# Patient Record
Sex: Female | Born: 1940
Health system: Southern US, Community
[De-identification: ages and names within clinical notes are randomized; demographics above are authoritative.]

## PROBLEM LIST (undated history)

## (undated) DIAGNOSIS — H3530 Unspecified macular degeneration: Secondary | ICD-10-CM

## (undated) DIAGNOSIS — M1990 Unspecified osteoarthritis, unspecified site: Secondary | ICD-10-CM

## (undated) DIAGNOSIS — I639 Cerebral infarction, unspecified: Secondary | ICD-10-CM

## (undated) DIAGNOSIS — R269 Unspecified abnormalities of gait and mobility: Secondary | ICD-10-CM

## (undated) DIAGNOSIS — G709 Myoneural disorder, unspecified: Secondary | ICD-10-CM

## (undated) DIAGNOSIS — G43909 Migraine, unspecified, not intractable, without status migrainosus: Secondary | ICD-10-CM

## (undated) DIAGNOSIS — D649 Anemia, unspecified: Secondary | ICD-10-CM

## (undated) DIAGNOSIS — M797 Fibromyalgia: Secondary | ICD-10-CM

## (undated) DIAGNOSIS — G43019 Migraine without aura, intractable, without status migrainosus: Secondary | ICD-10-CM

## (undated) DIAGNOSIS — K449 Diaphragmatic hernia without obstruction or gangrene: Secondary | ICD-10-CM

## (undated) DIAGNOSIS — M47812 Spondylosis without myelopathy or radiculopathy, cervical region: Secondary | ICD-10-CM

## (undated) DIAGNOSIS — J45909 Unspecified asthma, uncomplicated: Secondary | ICD-10-CM

## (undated) DIAGNOSIS — J189 Pneumonia, unspecified organism: Secondary | ICD-10-CM

## (undated) HISTORY — PX: DILATION AND CURETTAGE OF UTERUS: SHX78

## (undated) HISTORY — PX: HIATAL HERNIA REPAIR: SHX195

## (undated) HISTORY — DX: Spondylosis without myelopathy or radiculopathy, cervical region: M47.812

## (undated) HISTORY — DX: Migraine without aura, intractable, without status migrainosus: G43.019

## (undated) HISTORY — PX: ABDOMINAL HYSTERECTOMY: SHX81

## (undated) HISTORY — DX: Unspecified abnormalities of gait and mobility: R26.9

## (undated) HISTORY — PX: TONSILLECTOMY: SUR1361

---

## 1998-03-09 ENCOUNTER — Encounter: Payer: Self-pay | Admitting: Emergency Medicine

## 1998-03-09 ENCOUNTER — Emergency Department (HOSPITAL_COMMUNITY): Admission: EM | Admit: 1998-03-09 | Discharge: 1998-03-09 | Payer: Self-pay | Admitting: Emergency Medicine

## 1998-03-20 ENCOUNTER — Encounter: Payer: Self-pay | Admitting: Emergency Medicine

## 1998-03-20 ENCOUNTER — Emergency Department (HOSPITAL_COMMUNITY): Admission: EM | Admit: 1998-03-20 | Discharge: 1998-03-20 | Payer: Self-pay | Admitting: Emergency Medicine

## 1999-03-15 ENCOUNTER — Encounter: Payer: Self-pay | Admitting: Neurology

## 1999-03-15 ENCOUNTER — Encounter: Admission: RE | Admit: 1999-03-15 | Discharge: 1999-03-15 | Payer: Self-pay | Admitting: Neurology

## 2000-02-11 ENCOUNTER — Encounter: Admission: RE | Admit: 2000-02-11 | Discharge: 2000-03-19 | Payer: Self-pay | Admitting: Neurology

## 2006-09-09 ENCOUNTER — Encounter (INDEPENDENT_AMBULATORY_CARE_PROVIDER_SITE_OTHER): Payer: Self-pay | Admitting: Gastroenterology

## 2006-09-09 ENCOUNTER — Ambulatory Visit (HOSPITAL_COMMUNITY): Admission: RE | Admit: 2006-09-09 | Discharge: 2006-09-09 | Payer: Self-pay | Admitting: Gastroenterology

## 2007-09-09 ENCOUNTER — Encounter: Admission: RE | Admit: 2007-09-09 | Discharge: 2007-09-09 | Payer: Self-pay | Admitting: Internal Medicine

## 2007-10-09 ENCOUNTER — Encounter: Admission: RE | Admit: 2007-10-09 | Discharge: 2007-10-09 | Payer: Self-pay | Admitting: Orthopedic Surgery

## 2008-01-09 ENCOUNTER — Inpatient Hospital Stay (HOSPITAL_COMMUNITY): Admission: EM | Admit: 2008-01-09 | Discharge: 2008-01-13 | Payer: Self-pay | Admitting: Emergency Medicine

## 2008-01-11 ENCOUNTER — Ambulatory Visit: Payer: Self-pay | Admitting: Surgery

## 2008-01-11 ENCOUNTER — Encounter (INDEPENDENT_AMBULATORY_CARE_PROVIDER_SITE_OTHER): Payer: Self-pay | Admitting: *Deleted

## 2008-01-28 ENCOUNTER — Encounter: Admission: RE | Admit: 2008-01-28 | Discharge: 2008-01-28 | Payer: Self-pay | Admitting: Gastroenterology

## 2008-02-21 ENCOUNTER — Ambulatory Visit (HOSPITAL_COMMUNITY): Admission: RE | Admit: 2008-02-21 | Discharge: 2008-02-21 | Payer: Self-pay | Admitting: Gastroenterology

## 2008-04-21 ENCOUNTER — Encounter (INDEPENDENT_AMBULATORY_CARE_PROVIDER_SITE_OTHER): Payer: Self-pay | Admitting: General Surgery

## 2008-04-21 ENCOUNTER — Ambulatory Visit (HOSPITAL_COMMUNITY): Admission: RE | Admit: 2008-04-21 | Discharge: 2008-04-23 | Payer: Self-pay | Admitting: General Surgery

## 2010-06-18 ENCOUNTER — Other Ambulatory Visit: Payer: Self-pay | Admitting: Gastroenterology

## 2010-06-20 ENCOUNTER — Ambulatory Visit
Admission: RE | Admit: 2010-06-20 | Discharge: 2010-06-20 | Disposition: A | Discharge status: Home / Self Care | Payer: Medicare Other | Source: Ambulatory Visit | Attending: Gastroenterology | Admitting: Gastroenterology

## 2010-06-25 LAB — DIFFERENTIAL
Basophils Relative: 1 % (ref 0–1)
Lymphocytes Relative: 36 % (ref 12–46)
Lymphs Abs: 1.2 10*3/uL (ref 0.7–4.0)
Monocytes Relative: 10 % (ref 3–12)
Neutro Abs: 1.7 10*3/uL (ref 1.7–7.7)
Neutrophils Relative %: 49 % (ref 43–77)

## 2010-06-25 LAB — CBC
HCT: 40.9 % (ref 36.0–46.0)
Hemoglobin: 13.8 g/dL (ref 12.0–15.0)
MCHC: 33.8 g/dL (ref 30.0–36.0)
Platelets: 178 10*3/uL (ref 150–400)
RDW: 13.1 % (ref 11.5–15.5)

## 2010-06-25 LAB — TYPE AND SCREEN
ABO/RH(D): O NEG
Antibody Screen: NEGATIVE

## 2010-06-25 LAB — COMPREHENSIVE METABOLIC PANEL
BUN: 16 mg/dL (ref 6–23)
Calcium: 9.4 mg/dL (ref 8.4–10.5)
Creatinine, Ser: 0.89 mg/dL (ref 0.4–1.2)
Glucose, Bld: 117 mg/dL — ABNORMAL HIGH (ref 70–99)
Total Protein: 5.9 g/dL — ABNORMAL LOW (ref 6.0–8.3)

## 2010-06-25 LAB — ABO/RH: ABO/RH(D): O NEG

## 2010-06-25 LAB — PROTIME-INR: INR: 1 (ref 0.00–1.49)

## 2010-07-23 NOTE — Discharge Summary (Signed)
[name], [name]               ACCOUNT NO.:  [id number]   MEDICAL RECORD NO.:  [id number]          PATIENT TYPE:  INP   LOCATION:  3705                         FACILITY:  [facility]   PHYSICIAN:  Monte Fantasia, MD  DATE OF BIRTH:  24-Mar-1940   DATE OF ADMISSION:  01/09/2008  DATE OF DISCHARGE:  01/13/2008                               DISCHARGE SUMMARY   This 70 year old Caucasian lady patient was brought on January 09, 2008,  with the complaints of syncopal episode for which she was admitted in  the hospital.  The patient was on the telemetry monitoring.  She had a  CT of the head done on admission, which was negative.  EEG which was  done showed mild abnormal awake and sleep EEG with intermittent left  parietal slowing, and Neurology evaluation was done.  As per the  Neurology recommendation, no antiepileptic medications for now.  Diagnosis was syncopal seizures.  The patient also had a MRI and MRA of  the head.  The MRI of the head showed mild atrophy with advanced mal  disease, and MRA showed no vascular abnormality.  CT angio of the chest  was done due to high D-dimer studies, however, did not show any very  large central pulmonary embolism.  The patient had no telemetry events  and was fit to be discharged.   DISCHARGE DIAGNOSES:  1. Syncopal episode.  2. Syncopal seizures.  3. Fibromyalgia.  4. Degenerative joint disease.  5. Hiatal hernia.  6. Chronic back pain.  7. Migraine headaches.   PHYSICAL EXAMINATION:  VITAL SIGNS:  On discharge, temperature of 98.3,  heart rate of 75, respirations 18, blood pressure is 127/82, and 95%  saturations on room air.  HEENT:  Pupils equal and reacting to light.  No pallor.  NECK:  Supple.  No lymphadenopathy.  No carotid bruits.  RESPIRATORY:  Air entry is bilaterally equal.  No rales and no rhonchi.  CARDIOVASCULAR:  S1 and S2 normal.  No tachycardia, no gallops.  ABDOMEN:  Soft.  No organomegaly.  No masses.  There was no  distention  with no tenderness.  EXTREMITIES:  No edema feet.   LABORATORY DATA:  Labs done during the stay of the hospital.  MRA of the  head, no stenosis or vascular abnormality observed.  MRI of the head  mild-to-moderate atrophy with advanced small vessel disease that was  done on January 11, 2008.  CT angio done on January 10, 2008, showed no  large central pulmonary embolism, cardiomegaly, or moderate hiatal  hernia.  CT of the head done on January 09, 2008, showed no acute  abnormality.  CT cervical spine negative for fracture, cervical disk  degeneration, and spondylosis.  A 2-D echo done on January 11, 2008,  shows left ventricular ejection fraction of 55%-65%, no diagnostic left  ventricular regional wall abnormality; however, this possibility cannot  be completely excluded on the basis of the study and there was an  increased relative contribution of atrial contraction to the left  ventricular filling.   ASSESSMENT AND PLAN:  The patient will be discharged.  As  the patient is  on ciprofloxacin for urinary tract infection, we will continue for  another 5 days for same.  Urinalysis was positive on the day of  admission.   MEDICATIONS AT THE TIME OF DISCHARGE:  1. Aspirin 81 mg p.o. daily.  2. Ciprofloxacin 500 mg p.o. daily for 5 days.  3. Cymbalta 60 mg p.o. daily.  4. The patient has received influenza vaccine on December 2.  5. Protonix 40 mg p.o. q.12 h.  6. Senna 1 tablet p.o. nightly.   The patient has a primary care physician.  She is advised to follow up  with the primary care physician in 1 week.      Monte Fantasia, MD  Electronically Signed     MP/MEDQ  D:  01/13/2008  T:  01/13/2008  Job:  [id number]

## 2010-07-23 NOTE — Op Note (Signed)
NAMEMAN, BONNEAU               ACCOUNT NO.:  [id number]   MEDICAL RECORD NO.:  [id number]          PATIENT TYPE:  AMB   LOCATION:  ENDO                         FACILITY:  [facility]   PHYSICIAN:  Anselmo Rod, M.D.  DATE OF BIRTH:  08/03/1940   DATE OF PROCEDURE:  09/09/2006  DATE OF DISCHARGE:                               OPERATIVE REPORT   PROCEDURE PERFORMED:  Esophagogastroduodenoscopy with small bowel and  antral biopsies.   ENDOSCOPIST:  Anselmo Rod, M.D.   INSTRUMENT USED:  Pentax video panendoscope.   INDICATIONS FOR PROCEDURE:  A 70 year old white female with a history of  iron-deficiency anemia undergoing an EGD to rule out peptic ulcer  disease, esophagitis, gastritis, etc.   PREPROCEDURE PREPARATION:  Informed consent was procured.  The patient  was fasted for 8 hours prior to the procedure.  Risks and benefits of  the procedure were discussed with the patient in detail.   PREPROCEDURE PHYSICAL:  The patient had stable vital signs.  NECK:  Supple.  CHEST:  Clear to auscultation.  S1-S2 regular.  ABDOMEN:  Soft with normal bowel sounds.   DESCRIPTION OF PROCEDURE:  The patient was placed in the left lateral  decubitus position and sedated with 75 mcg of Fentanyl and 8 mg of  Versed given intravenously in slow incremental doses. Once the patient  was adequately sedate and maintained on low-flow oxygen and continuous  cardiac monitoring, the Pentax video panendoscope was advanced through  the mouthpiece, over the tongue, into the esophagus under direct vision.  The entire esophagus was widely patent with no evidence of ring,  stricture, mass, esophagitis or Barrett's mucosa. The scope was then  advanced into the stomach. A large hiatal hernia was noted on high  retroflexion with multiple erosions in it consistent with Sheria Lang  lesions, which I suspect are the source of the patient's iron deficiency  anemia. A small nodular lesion was biopsied from the antrum  (cold  biopsies x2). No ulcers, erosions, masses or polyps were seen in the mid  body and antrum. The proximal small bowel appeared normal. Small bowel  biopsies were done to rule out sprue. The patient tolerated the  procedure well without immediate complications.   IMPRESSION:  1. Normal-appearing esophagus.  2. Large hiatal hernia with Sheria Lang lesions.  3. Small nodule biopsied from the antrum (cold biopsies x2).  4. Normal proximal small bowel.  Small-bowel biopsies done to rule out      sprue.   RECOMMENDATIONS:  1. Await pathology results.  2. PPI of choice.  3. Avoid all nonsteroidals including aspirin for the next 4 weeks.  4. Proceed with a colonoscopy at this time.  Further recommendations      to be made in follow-up.      Anselmo Rod, M.D.  Electronically Signed     JNM/MEDQ  D:  09/09/2006  T:  09/10/2006  Job:  [id number]   cc:   Olene Craven, M.D.  Fax: [phone]

## 2010-07-23 NOTE — Procedures (Signed)
PAST MEDICAL HISTORY:  The patient is a 70 year old female who was  admitted for syncope, tachycardia back pain and headache.  The patient  had a syncopal episode, also seen having jerking motions and biting  tongue.  History of fibromyalgia and hiatal hernia.   CURRENT MEDICATIONS:  Lovenox, aspirin, Protonix, Cymbalta, Cipro,  Phenergan, Zofran, Robitussin, Tylenol, OxyContin, Dilaudid, Ventolin.   TECHNICAL:  An 18-channel EEG was performed based on standard  international 10-20 system.  Total recording time was 21.3 minutes.  The  seventeenth channel dedicated to EKG, which has demonstrated normal  sinus rhythm of 90 beats per minute.   Upon awakening, the posterior background activity was well developed; 11  Hz, symmetric, reactive to eye opening and closure.   During the wakefullness, there were 2 short runs of left parietal  slowing and with sharp transient, phase reversal at C4, most obvious at  page 18 and 24.  During drowsiness, there was also short runs of bio-  synchronized, frontal predominant rhythmic delta activity.   Photic stimulation with flash frequency 1-19 Hz, and hyperventilation  was performed.  There was occasionallyapperance of C4 sharp transient,  most obvious during post hyperventilation period of time.  There was no  epileptiform discharge recorded.   The patient was able to achieve stage II during sleeping, as evident by  vertex waves, sleep spindles, K complex.   In conclusion, this is mild abnormal awake and sleep EEG.  There were  short runs of intermittent left parietal slowing, mainly involving left  parietal region.  There was also evidence of C4 sharp transient.  But,  there was no evidence of typical epileptiform discharge.  The above  findings warrant the patient to have imaging study such as MRI of the  brain.      Levert Feinstein, MD  Electronically Signed     WJ:XBJY  D:  01/11/2008 18:19:22  T:  01/11/2008 23:09:48  Job #:  [id number]

## 2010-07-23 NOTE — Op Note (Signed)
[name], [name]               ACCOUNT NO.:  [id number]   MEDICAL RECORD NO.:  [id number]          PATIENT TYPE:  OIB   LOCATION:  0098                         FACILITY:  [facility]   PHYSICIAN:  Adolph Pollack, M.D.DATE OF BIRTH:  1940-08-26   DATE OF PROCEDURE:  04/21/2008  DATE OF DISCHARGE:                               OPERATIVE REPORT   PREOPERATIVE DIAGNOSES:  1. Large hiatal hernia.  2. Gastroesophageal reflux.   POSTOPERATIVE DIAGNOSES:  1. Large hiatal hernia.  2. Gastroesophageal reflux.   PROCEDURES:  1. Laparoscopic hiatal hernia repair with Surgisis mesh.  2. Laparoscopic Nissen fundoplication (over a size 56 dilator).   SURGEON:  Dr. Abbey Chatters.   ASSISTANT:  Luretha Murphy, MD   ANESTHESIA:  General.   INDICATIONS:  This is a 70 year old female who has had a known hiatal  hernia which has substantially enlarged over the past 2 years and  becoming increasing symptomatic with some intermittent dysphagia, likely  from compression of the hernia on the esophagus and reflux.  She now  presents for repair and fundoplication.  We have gone over the  procedure, risks, lifestyle modifications and aftercare preoperatively.   TECHNIQUE:  She was brought to the operating room, placed supine on the  operating table and a general anesthetic was administered.  A Foley  catheter was inserted into the bladder.  The abdominal wall was  sterilely prepped and draped.   In the left upper quadrant, a 1 cm incision was made inferior to the  costal margin.  Using a 10-mm OptiView trocar, I gained access into the  peritoneal cavity and a pneumoperitoneum was created.   The laparoscope was introduced through the 10 mm trocar and I inspected  the area directly underneath the trocar.  There was no bleeding or organ  injury.  I then placed a 10 mm trocar in a supraumbilical midline  position, two 10 mm trocars were placed in the right upper quadrant, a 5  mm trocar was then  placed in the left lateral left upper quadrant.  The  angled laparoscope was introduced through the epigastric trocar after  reversed Trendelenburg had been achieved.  I then made a 5 mm incision  and placed a self-retaining liver retractor and retracted the anterior  aspect of left lobe of the liver to expose a patulous hiatus.  I was  able to reduce the stomach and noted a hernia sac.  I began by dividing  the thin gastrohepatic ligament up to the right crus.  I then began  mobilizing the sac free from the mediastinum and its hiatal attachments  beginning at the level of the right crus, extending anteriorly and going  down toward the left crus.  This allowed me to reduce the stomach.   Following this, I then identified the left crus and began dissecting sac  free from it using the harmonic scalpel.  Once I had most of the sac  dissected from the left crus, I then identified the apex of the crura  posteriorly.  I created a partial window, but not complete.   I then identified  the fundus of the stomach and divided short gastric  vessels all the way up around the angle of His.  This then allowed me to  create a true retroesophageal window and a Penrose drain was placed  through this.  The gastroesophageal junction was then retracted  anteriorly.  I then dissected the sac free from all its crura and  diaphragmatic attachments.  I then resected a fair amount of the sac and  sent it to pathology.  At this point in time, there no further  attachments between the mediastinum of the stomach and stomach stayed  easily in the abdominal cavity, and I had good esophageal length.  There  is no evidence of gastric, splenic or esophageal injury.   I then repaired the hiatal hernia defect with interrupted 0  nonabsorbable pledgeted sutures, placing approximately four of these  posteriorly and one anteriorly.  There was slight angulation of the  esophagus.  After I had done this, I then brought a piece  of Surgisis  mesh into the field, (hiatal patch).  I hydrated it and then inserted  into the peritoneal cavity.  I then placed it to where the curve of the  U-shaped defect cut into it was posterior.  I then anchored the anterior  portions of the mesh to the anterior crura with 0 nonabsorbable suture.  The lateral and posterolateral portions were then anchored to the crura  with 0 nonabsorbable sutures.  This provided for good overlay and was  under no tension.   Following this, I then brought the fundus of the stomach to the  retroesophageal window.  A size 56 lighted bougie was then passed down  the esophagus and into the stomach.  A 360 degree fundoplication was  performed to a length of 2 to 2-1/2 cm.  The first two bites involved  the left leaf of the wrap esophagus and the right leaf of the wrap.  The  third most distal bite involved just the left and right leaves of the  wrap.  The bougie was removed this was noted to be a floppy Nissen under  no tension.   I then thoroughly inspected the area and noted no bleeding or organ  injury.  I then removed the self-retaining retractor.  The trocars were  removed and the pneumoperitoneum was released.   All skin incisions were closed with 4-0 Monocryl subcuticular stitches  followed by Steri-Strips and sterile dressings.  She tolerated the  procedure without apparent complications and was taken to the recovery  room in satisfactory condition.      Adolph Pollack, M.D.  Electronically Signed     TJR/MEDQ  D:  04/21/2008  T:  04/21/2008  Job:  [id number]   cc:   Anselmo Rod, M.D.  Fax: [phone]   Merlene Laughter. Renae Gloss, M.D.  Fax: [phone]   Deanna Artis. Sharene Skeans, M.D.  Fax: [phone]

## 2010-07-23 NOTE — Group Therapy Note (Signed)
[name], [name]               ACCOUNT NO.:  [id number]   MEDICAL RECORD NO.:  [id number]          PATIENT TYPE:  INP   LOCATION:  3705                         FACILITY:  [facility]   PHYSICIAN:  Monte Fantasia, MD  DATE OF BIRTH:  [birth year]                                 PROGRESS NOTE   A 70 year old Caucasian female patient was brought in on January 09, 2008, for admission for observation for syncope, possible seizure  episode, and patient was tachycardic on admission.  Patient had a CT  head which was negative.  Also, had EEG done which showed the mild  abnormal awake/asleep EEG.  There were short runs of intermittent left  parietal slowing involving C4 and S4 leads with evidence of C4 sharp  transient.  No evidence of epileptiform discharge and patient also had  an MRI and an MRA of the head.  The MRI of the head showed mild atrophy  with advanced small-vessel disease.  No acute intracranial findings.  MRA of the head showed no stenosis or vascular abnormality.  The CT  angio of the chest was also done in view of high D-dimer studies and a  syncopal episode which had no large central embolism, cardiomegaly, and  moderate hiatal hernia.  Patient had no telemetry events and was  evaluated by physical and occupational therapy.  Has had no complaints  over the last 24 hours.   VITAL SIGNS:  Temperature 97.8.  Heart rate of 79.  Respirations 20.  Blood pressure  is 121/79.  Oxygen saturation is 93%, room air.   EXAMINATION:  HEENT:  Neck is supple.  Pupils equal and reacting to light.  No pallor.  No lymphadenopathy.  No icterus.  No JVD.  RESPIRATORY EXAMINATION:  Air entry is bilaterally equal.  No rales.  No  rhonchi.  CARDIOVASCULAR EXAMINATION:  S1 and S2 normal.  No tachycardia.  No  gallops.  ABDOMEN:  Soft.  No organomegaly.  No masses felt.  No distention.  No  tenderness.  Bowel sounds are heard.  EXTREMITIES:  No edema.   LABS:  Total WBC of 4.7, H and H of  14.1/hematocrit 39.8, platelets is 222,  sodium is 141, potassium 3.9, chloride 105, bicarb 28, glucose 96, BUN  is 9, creatinine 0.7, bilirubin 0.5, alkaline phosphatase 74, AST is 10,  ALT 12, total protein 6.2, albumin is 3.4, calcium of 9.2.  Cardiac  enzymes, 3 sets, have been negative.  B12 is 407, folate is 8.7.  UA was  positive with leukocytes, moderate, few bacteria, WBCs 11 to 20, RPR is  negative.   ASSESSMENT AND PLAN:  1. Syncopal episode, possibly seizure disorder.  Seizure cannot be      ruled.  2. Tachycardia, resolved.  Pulmonary embolism ruled out.  3. Fibromyalgia.  4. Degenerative joint disease.  5. Hiatal hernia.  6. Chronic back pain.  7. Migraine headaches.   PLAN:  Patient had an EEG.  We will check with a prolactin level.  We will have  a neurology evaluation in view of the EEG report as dictated in the EEG  interpretation.  Ultram has been discontinued.   Patient is on ciprofloxacin for the urinary tract infection.  We will  continue the same for now.  No urine cultures sent.  Patient has no  white count and patient has been afebrile during this stay of the  hospital.   No telemetry events.  A 2D echo was done, that report is still pending.  We will follow up with the report as per the patient was told that the  echo was normal.  We will continue with the aspirin, Protonix, Cymbalta  as per the medication profile sheet.  DVT and GI prophylaxis given.      Monte Fantasia, MD  Electronically Signed     MP/MEDQ  D:  01/12/2008  T:  01/12/2008  Job:  [id number]

## 2010-07-23 NOTE — Op Note (Signed)
[name], [name]               ACCOUNT NO.:  [id number]   MEDICAL RECORD NO.:  [id number]          PATIENT TYPE:  AMB   LOCATION:  ENDO                         [facility]ACILITY:  [facility]   PHYSICIAN:  Anselmo Rod, M.D.  DATE OF BIRTH:  04-07-1940   DATE OF PROCEDURE:  09/09/2006  DATE OF DISCHARGE:                               OPERATIVE REPORT   PROCEDURE PERFORMED:  Screening colonoscopy.   ENDOSCOPIST:  Anselmo Rod, M.D.   INSTRUMENT USED:  Pentax video colonoscope.   INDICATIONS FOR PROCEDURE:  A 70 year old white female with a history of  iron-deficiency anemia, chronic constipation and left lower quadrant  pain undergoing colonoscopy to rule out colonic polyps, masses, etc.   PREPROCEDURE PREPARATION:  Informed consent was procured from the  patient. The patient was fasted for 8 hours prior to the procedure and  prepped with 2 Dulcolax pills, a bottle of magnesium citrate and a  gallon of NuLYTELY the night prior to the procedure.  The risks and  benefits of the procedure including a 10% miss rate of cancer and polyps  were discussed with the patient as well.   PREPROCEDURE PHYSICAL:  The patient had stable vital signs.  NECK:  Supple.  CHEST:  Clear to auscultation.  S1-S2 regular.  ABDOMEN:  Soft with normal bowel sounds.   DESCRIPTION OF PROCEDURE:  The patient was placed in the left lateral  decubitus position and sedated with an additional 50 mcg of Fentanyl and  3 mg of Versed given intravenously in slow incremental doses. Once the  patient was adequately sedate and maintained on low-flow oxygen and  continuous cardiac monitoring, the Pentax video colonoscope was advanced  from the rectum to the cecum with difficulty. The patient had  significant amount of residual stool in the colon. Multiple washes were  done.  There was evidence of diverticulosis throughout the colon with  more prominent changes in the left colon. No masses or polyps were seen.  Several stool  balls were seen. Some of the diverticula had inspissated  stool in them.The appendiceal orifice and the ileocecal valve were  visualized and photographed. The terminal ileum appeared healthy and  without lesions. Retroflexion in the rectum revealed no abnormalities.  The patient tolerated the procedure well without complications.   IMPRESSION:  1. Pandiverticulosis with more prominent changes in the left colon.  2. No masses or polyps seen.  3. Significant amount of residual stool in the colon.  4. Normal terminal ileum.  5. No source of anemia identified on the colonoscopy.   RECOMMENDATIONS:  1. A high-fiber diet has been discussed with the patient in detail.  2. Brochures on diverticulosis been given to the patient for      education.  3. Outpatient follow-up is advised to the next 2 weeks for further      recommendations.  4. Repeat colonoscopy has been advised in the next 10 years.  If the      patient has any abnormal symptoms prior to that, repeat colonoscopy      will be done at an earlier date.  Anselmo Rod, M.D.  Electronically Signed     JNM/MEDQ  D:  09/09/2006  T:  09/10/2006  Job:  [id number]   cc:   Olene Craven, M.D.  Fax: [phone]

## 2010-07-23 NOTE — Consult Note (Signed)
[name], [name]               ACCOUNT NO.:  [id number]   MEDICAL RECORD NO.:  [id number]          PATIENT TYPE:  INP   LOCATION:  3705                         FACILITY:  [facility]   PHYSICIAN:  Deanna Artis. Hickling, M.D.DATE OF BIRTH:  08-22-1940   DATE OF CONSULTATION:  01/12/2008  DATE OF DISCHARGE:                                 CONSULTATION   CHIEF COMPLAINT:  Syncope versus seizures.   HISTORY OF PRESENT CONDITION:  [name] is a 70 year old woman who  was at home, having breakfast with her mother.  She stood up, walked to  the left to get an apron.  She felt woozy as if she were outside her  body.  She lost consciousness and apparently fell to the floor striking  the back of her head.  She had a blank stare on her face.  She had some  shaking of her upper body and during the time she fell, but then this  stopped and she was limp and poorly responsive.   Currently, this lack of consciousness lasted until she arrived to the  emergency department at [facility].  She was admitted for  evaluation of her syncopal episode and to determine whether or not  seizures played a role in it.   The patient bit her tongue on the right side.  She did not have  incontinence.  She has not had any sleep disorder, problems with  headache, or of any sort.   PAST MEDICAL HISTORY:  Remarkable for possible TIA, for osteoarthritis,  for fibromyalgia, for hiatal hernia, for chronic back pain, and  migraine.  The patient was seen by my partner for back pain.  She says  that she had a similar syncopal episode fell out of a truck over 10  years ago.  She stopped working about that time.   CURRENT MEDICATIONS:  Cymbalta 60 mg daily, Imitrex for migraines.  She  had an epidural injection.   FAMILY HISTORY:  Remarkable for coronary artery disease in her mother.   SOCIAL HISTORY:  The patient does not use tobacco, alcohol, drugs.  She  had five children and is married.   REVIEW OF SYSTEMS:   Her 12-system review is negative except as noted  above in the history of present illness.   PHYSICAL EXAMINATION:  VITAL SIGNS:  Blood pressure 121/99, resting  pulse 79, respirations 20, temperature 97.8.  HEAD, EYES, EARS, NOSE AND THROAT:  No signs of infection.  NECK:  Supple neck, full range of motion.  No cranial or cervical  bruits.  LUNGS:  Clear to auscultation.  HEART:  No murmurs.  Pulses normal.  ABDOMEN:  Soft, nontender.  Bowel sounds normal.  EXTREMITIES:  Well-formed without edema, cyanosis, alterations in tone  or tight heel cords.  SKIN:  No lesions.  VASCULAR:  Tone was normal.  NEUROLOGIC:  Mental status:  The patient was awake, alert, attentive,  appropriate.  No dysphasia, dyspraxia, names objects, follows commands.  Conveys thoughts and feelings.  Cranial Nerves:  Round reactive pupils.  Normal fundi.  Full visual fields to double simultaneous stimuli.  The  patient has right eye amblyopia.  She has some problems with fully  moving the right eye conjugately with the left one.  She is able to  protrude tongue and elevate her uvula midline.  Air conduction greater  than bone conduction bilaterally.   Motor Examination:  The patient had normal strength in all four  extremities.  She showed no localized weakness.  She had good finger-to-  nose and rapid repetitive altering movements.  She had good finger  apposition.  Her gait was normal and tandem, the patient slightly moved  to the left side.  Romberg response is negative.  She could push up on  her toes and heels without difficulty.  Sensation was intact to cold,  vibration, stereognosis.   Cerebellar Examination:  Good finger-to-nose, heel-knee-shin.  Deep  tendon reflexes were normal proximally, diminished distally.  The  patient had bilateral flexor plantar responses.   I have reviewed her MRI scan of the brain and it is normal with mild  atrophy and with mild small vessel disease.  There is no evidence  of  acute infarction.  MRA showed widely patent intracranial vessels both in  the anterior and posterior circulation without significant stenosis.  CT  scan of the brain was normal.  Carotid Doppler showed no hemodynamically  significant stenosis.  A 2D echocardiogram showed normal ejection  fraction of 55-60%.   LABORATORY STUDIES:  Sodium 141, potassium 3.9, chloride 105, CO2 28,  BUN 9, creatinine 0.78, and platelet count 96,000.  White blood cell  count 4700, hemoglobin 14.1, hematocrit 39.8, platelet count 222,000.  Liver functions were normal.  Total cholesterol 155, HDL 48, LDL 87,  hemoglobin A1c 5.6.  B12 407, folic acid 8.7, triglycerides 100.   IMPRESSION:  1. Syncope 780.2.  2. Syncopal seizure 780.39.   DISCUSSION:  I am not certain at this time whether or not this patient  truly has epilepsy or just syncope.  She had a long period where she was  poorly responsive.  This would suggest that either she lost  consciousness when she fell or that she perhaps was in postictal state  from seizure.  There is a positive family history of seizure in her son.  The patient herself has never had a seizure.  It is important to note  that she bit her tongue without loss of continence.  I reviewed her EEG  and it is normal in the waking state, drowsiness in the natural sleep.   At this time, I am uncomfortable with the diagnosis of seizures.   PLAN:  The patient needs to refrain from driving for 6 months time.  If  she has further episodes, we will need to perform a sleep deprived EEG,  although saw a patient who is drowsy and in natural sleep with no  seizures.  One other option would be a prolonged ambulatory EEG to see  if we could find evidence of seizure activity.   Subsequent episode is more clear-cut than this, we might just place her  on antiepileptic medicine, but I am unwilling to do so because the  differential diagnosis is wide.  I appreciate the opportunity to   participate in her care.  If you have questions, I could be of  assistance, do not hesitate to contact me.  She is cleared for discharge  at this time.      Deanna Artis. Sharene Skeans, M.D.  Electronically Signed     [facility]/MED[facility]  D:  01/12/2008  T:  01/13/2008  Job:  [id number]   cc:   Monte Fantasia, MD

## 2010-07-23 NOTE — H&P (Signed)
[name], [name]               ACCOUNT NO.:  [id number]   MEDICAL RECORD NO.:  [id number]          PATIENT TYPE:  EMS   LOCATION:  MAJO                         FACILITY:  [facility]   PHYSICIAN:  Ladell Pier, M.D.   DATE OF BIRTH:  1940-03-17   DATE OF ADMISSION:  01/09/2008  DATE OF DISCHARGE:                              HISTORY & PHYSICAL   CHIEF COMPLAINT:  Syncopal episode.   HISTORY OF PRESENT ILLNESS:  The patient is a 70 year old white female  who stated that she went to church this morning, was doing fine except  for her back pain. Then she went over to her mother's house.  Both the  patient and her mother stated that because she had her church clothes  on, she told to go get an apron in the drawer so as not to mess up her  clothes.  The patient said she did know where the aprons were.  She went  to get it for her, and she was on her way to get the apron for her  daughter she noted her daughter was holding onto the drawer and then she  fell backward.  She said she fell backward on the floor and started  jerking all over.  She said her eyes were not rolled back, but kind of  going from side to side, and she was all stretched out with jerking  movements on the floor.  She stated that lasted for a while. She is not  sure how long it lasted.  The patient bit her tongue but no loss of  bladder or bowel function.  The patient called her neighbor and also  call 9-1-1.  Then after the episode she stated that the patient was just  lying, there not moving. When EMS came she was able to answer questions  but was very slow.  The patient is now back to baseline.  She could  vaguely remember feeling like she was going to pass out but does not  remember anything after that with the episode.   PAST MEDICAL HISTORY:  Significant for  1. Fibromyalgia.  2. DJD.  3. Hiatal hernia.  4. Chronic back pain for which she sees Dr. Alveda Reasons.  5. Migraine headaches.   FAMILY HISTORY:  Mother has  heart disease and open heart surgery.  Father died of pneumonia.  He also had a stroke.  He died at 69 years  old.   SOCIAL HISTORY:  She does not smoke or drink alcohol.  She is married.  She has five children.   MEDICATIONS:  She takes Cymbalta 60 mg daily, __________ 300 mg, she  recently started. She also gets epidural injections per Dr. Alveda Reasons for  her back pain. And she has Imitrex p.r.n. for migraine headaches.   ALLERGIES:  RELAFEN and causes hives, and ELAVIL causes change in mental  status.   REVIEW OF SYSTEMS:  Negative otherwise stated in HPI.   PHYSICAL EXAM:  VITAL SIGNS:  Temperature 98, blood pressure 126/92,  pulse of 118, respirations 18, pulse ox 98% on room air.  GENERAL: The patient lying  on the stretcher, does not seem to be in any  acute distress.  HEENT: Normocephalic, atraumatic.  Pupils reactive to light without  erythema.  She does have discoloration of the tongue so that she bit her  tongue.  CARDIOVASCULAR:  She is slightly tachycardiac.  LUNGS: Clear bilaterally.  ABDOMEN: Soft, nontender, nondistended.  Positive bowel sounds.  EXTREMITIES:  No edema.  NEUROLOGICAL:  Exam is nonfocal.   LABS:  PT 13.1, INR 1.  WBC 5.6, hemoglobin 15.4, MCV 91.8, platelet  180.  Chest x-ray: No acute disease.  C spine, cervical disk  degeneration and spondylosis. Head CT negative.   ASSESSMENT/PLAN:  1. Syncope, question of a seizure episode.  2. Tachycardia.  3. Fibromyalgia.   Will admit the patient to the hospital, question of the etiology of her  syncope. Sounds like she could have had a seizure. Sometimes Ultram can  lower your seizure threshold. Will discontinue the Ultram. Will get an  EEG. Will check a prolactin level. Will check D-dimer also because she  is tachycardiac, and tachycardia with hypertension can be caused by PE.  Will start on some IV fluids, cycle cardiac enzymes. Will check her TSH  with the tachycardia.      Ladell Pier, M.D.   Electronically Signed     [facility]/MED[facility]  D:  01/09/2008  T:  01/09/2008  Job:  [id number]   cc:   Merlene Laughter. Renae Gloss, M.D.

## 2010-12-10 LAB — CBC
HCT: 39.8
HCT: 46
MCV: 90
MCV: 90.1
MCV: 91.8
Platelets: 176
Platelets: 180
Platelets: 222
RBC: 4.42
RDW: 13.5
WBC: 5.2
WBC: 5.6

## 2010-12-10 LAB — APTT: aPTT: 26

## 2010-12-10 LAB — DIFFERENTIAL
Basophils Absolute: 0
Basophils Relative: 0
Eosinophils Absolute: 0.1
Eosinophils Relative: 2
Eosinophils Relative: 2
Lymphocytes Relative: 16
Lymphs Abs: 0.8
Monocytes Relative: 8
Neutrophils Relative %: 74

## 2010-12-10 LAB — TROPONIN I
Troponin I: 0.01
Troponin I: 0.01
Troponin I: <0.01

## 2010-12-10 LAB — COMPREHENSIVE METABOLIC PANEL
ALT: 11
AST: 18
AST: 19
Albumin: 3.4 — ABNORMAL LOW
Albumin: 3.7
Alkaline Phosphatase: 80
BUN: 11
BUN: 9
CO2: 27
CO2: 28
Calcium: 9
Calcium: 9.2
Chloride: 101
Chloride: 103
Creatinine, Ser: 0.78
Creatinine, Ser: 0.92
Creatinine, Ser: 0.93
Creatinine, Ser: 0.97
GFR calc Af Amer: >60
GFR calc Af Amer: >60
GFR calc non Af Amer: 57 — ABNORMAL LOW
GFR calc non Af Amer: >60
Glucose, Bld: 161 — ABNORMAL HIGH
Potassium: 5
Sodium: 135
Total Bilirubin: 0.5
Total Bilirubin: 0.6
Total Bilirubin: 0.7
Total Protein: 6
Total Protein: 6.1
Total Protein: 6.2

## 2010-12-10 LAB — CK TOTAL AND CKMB (NOT AT ARMC)
CK, MB: 2.4
CK, MB: 6.7 — ABNORMAL HIGH
Relative Index: 1.9
Relative Index: 4.4 — ABNORMAL HIGH
Relative Index: INVALID
Total CK: 91

## 2010-12-10 LAB — LIPID PANEL
HDL: 48
LDL Cholesterol: 87
Total CHOL/HDL Ratio: 3.2
Triglycerides: 100
VLDL: 20

## 2010-12-10 LAB — D-DIMER, QUANTITATIVE: D-Dimer, Quant: 1.17 — ABNORMAL HIGH

## 2010-12-10 LAB — URINE MICROSCOPIC-ADD ON

## 2010-12-10 LAB — URINALYSIS, ROUTINE W REFLEX MICROSCOPIC
Bilirubin Urine: NEGATIVE
Ketones, ur: NEGATIVE
Nitrite: NEGATIVE
Protein, ur: NEGATIVE
Urobilinogen, UA: 0.2

## 2010-12-10 LAB — PROTIME-INR
INR: 1
Prothrombin Time: 13.1
Prothrombin Time: 13.4

## 2010-12-10 LAB — FOLATE: Folate: 8.7

## 2010-12-10 LAB — TSH: TSH: 2.258

## 2010-12-10 LAB — HEMOGLOBIN A1C: Hgb A1c MFr Bld: 5.6

## 2010-12-10 LAB — VITAMIN B12: Vitamin B-12: 407 (ref 211–911)

## 2013-05-29 ENCOUNTER — Encounter (HOSPITAL_COMMUNITY): Payer: Self-pay | Admitting: Emergency Medicine

## 2013-05-29 ENCOUNTER — Emergency Department (HOSPITAL_COMMUNITY): Payer: Medicare Other

## 2013-05-29 ENCOUNTER — Observation Stay (HOSPITAL_COMMUNITY)
Admission: EM | Admit: 2013-05-29 | Discharge: 2013-05-31 | Disposition: A | Discharge status: Home / Self Care | Payer: Medicare Other | Attending: Internal Medicine | Admitting: Internal Medicine

## 2013-05-29 DIAGNOSIS — G459 Transient cerebral ischemic attack, unspecified: Secondary | ICD-10-CM

## 2013-05-29 DIAGNOSIS — R200 Anesthesia of skin: Secondary | ICD-10-CM

## 2013-05-29 DIAGNOSIS — G43909 Migraine, unspecified, not intractable, without status migrainosus: Secondary | ICD-10-CM

## 2013-05-29 DIAGNOSIS — R531 Weakness: Secondary | ICD-10-CM | POA: Diagnosis present

## 2013-05-29 DIAGNOSIS — R519 Headache, unspecified: Secondary | ICD-10-CM | POA: Diagnosis present

## 2013-05-29 DIAGNOSIS — R209 Unspecified disturbances of skin sensation: Secondary | ICD-10-CM | POA: Insufficient documentation

## 2013-05-29 DIAGNOSIS — M6281 Muscle weakness (generalized): Secondary | ICD-10-CM

## 2013-05-29 DIAGNOSIS — R51 Headache: Secondary | ICD-10-CM | POA: Insufficient documentation

## 2013-05-29 DIAGNOSIS — M542 Cervicalgia: Secondary | ICD-10-CM

## 2013-05-29 DIAGNOSIS — R29818 Other symptoms and signs involving the nervous system: Principal | ICD-10-CM | POA: Insufficient documentation

## 2013-05-29 DIAGNOSIS — Z886 Allergy status to analgesic agent status: Secondary | ICD-10-CM | POA: Insufficient documentation

## 2013-05-29 DIAGNOSIS — M797 Fibromyalgia: Secondary | ICD-10-CM | POA: Diagnosis present

## 2013-05-29 DIAGNOSIS — Z888 Allergy status to other drugs, medicaments and biological substances status: Secondary | ICD-10-CM | POA: Insufficient documentation

## 2013-05-29 DIAGNOSIS — Z87828 Personal history of other (healed) physical injury and trauma: Secondary | ICD-10-CM | POA: Insufficient documentation

## 2013-05-29 HISTORY — DX: Fibromyalgia: M79.7

## 2013-05-29 HISTORY — DX: Transient cerebral ischemic attack, unspecified: G45.9

## 2013-05-29 HISTORY — DX: Migraine, unspecified, not intractable, without status migrainosus: G43.909

## 2013-05-29 HISTORY — DX: Unspecified osteoarthritis, unspecified site: M19.90

## 2013-05-29 HISTORY — DX: Diaphragmatic hernia without obstruction or gangrene: K44.9

## 2013-05-29 HISTORY — DX: Anesthesia of skin: R20.0

## 2013-05-29 LAB — URINALYSIS, ROUTINE W REFLEX MICROSCOPIC
Bilirubin Urine: NEGATIVE
GLUCOSE, UA: NEGATIVE mg/dL
Hgb urine dipstick: NEGATIVE
Ketones, ur: NEGATIVE mg/dL
NITRITE: NEGATIVE
PH: 5.5 (ref 5.0–8.0)
Protein, ur: NEGATIVE mg/dL
SPECIFIC GRAVITY, URINE: 1.011 (ref 1.005–1.030)
Urobilinogen, UA: 0.2 mg/dL (ref 0.0–1.0)

## 2013-05-29 LAB — COMPREHENSIVE METABOLIC PANEL
ALBUMIN: 3.8 g/dL (ref 3.5–5.2)
ALT: 9 U/L (ref 0–35)
AST: 13 U/L (ref 0–37)
Alkaline Phosphatase: 72 U/L (ref 39–117)
BUN: 17 mg/dL (ref 6–23)
CALCIUM: 9.4 mg/dL (ref 8.4–10.5)
CHLORIDE: 100 meq/L (ref 96–112)
CO2: 28 meq/L (ref 19–32)
CREATININE: 0.83 mg/dL (ref 0.50–1.10)
GFR calc Af Amer: 80 mL/min — ABNORMAL LOW (ref >90)
GFR calc non Af Amer: 69 mL/min — ABNORMAL LOW (ref >90)
Glucose, Bld: 101 mg/dL — ABNORMAL HIGH (ref 70–99)
Potassium: 4 mEq/L (ref 3.7–5.3)
SODIUM: 140 meq/L (ref 137–147)
Total Bilirubin: 0.2 mg/dL — ABNORMAL LOW (ref 0.3–1.2)
Total Protein: 6.6 g/dL (ref 6.0–8.3)

## 2013-05-29 LAB — CBC WITH DIFFERENTIAL/PLATELET
BASOS ABS: 0 10*3/uL (ref 0.0–0.1)
BASOS PCT: 0 % (ref 0–1)
EOS PCT: 3 % (ref 0–5)
Eosinophils Absolute: 0.2 10*3/uL (ref 0.0–0.7)
HEMATOCRIT: 40.4 % (ref 36.0–46.0)
Hemoglobin: 14 g/dL (ref 12.0–15.0)
LYMPHS PCT: 16 % (ref 12–46)
Lymphs Abs: 0.9 10*3/uL (ref 0.7–4.0)
MCH: 31.7 pg (ref 26.0–34.0)
MCHC: 34.7 g/dL (ref 30.0–36.0)
MCV: 91.4 fL (ref 78.0–100.0)
MONO ABS: 0.5 10*3/uL (ref 0.1–1.0)
Monocytes Relative: 9 % (ref 3–12)
NEUTROS ABS: 4.2 10*3/uL (ref 1.7–7.7)
Neutrophils Relative %: 71 % (ref 43–77)
PLATELETS: 179 10*3/uL (ref 150–400)
RBC: 4.42 MIL/uL (ref 3.87–5.11)
RDW: 12.9 % (ref 11.5–15.5)
WBC: 5.9 10*3/uL (ref 4.0–10.5)

## 2013-05-29 LAB — URINE MICROSCOPIC-ADD ON

## 2013-05-29 LAB — PROTIME-INR
INR: 0.99 (ref 0.00–1.49)
Prothrombin Time: 12.9 seconds (ref 11.6–15.2)

## 2013-05-29 LAB — GLUCOSE, CAPILLARY: GLUCOSE-CAPILLARY: 86 mg/dL (ref 70–99)

## 2013-05-29 LAB — RAPID URINE DRUG SCREEN, HOSP PERFORMED
Amphetamines: NOT DETECTED
BARBITURATES: NOT DETECTED
BENZODIAZEPINES: NOT DETECTED
COCAINE: NOT DETECTED
Opiates: NOT DETECTED
TETRAHYDROCANNABINOL: NOT DETECTED

## 2013-05-29 LAB — ETHANOL

## 2013-05-29 LAB — TROPONIN I

## 2013-05-29 LAB — APTT: aPTT: 28 seconds (ref 24–37)

## 2013-05-29 MED ORDER — CLOPIDOGREL BISULFATE 75 MG PO TABS
75.0000 mg | ORAL_TABLET | Freq: Every day | ORAL | Status: DC
  Administered 2013-05-29 – 2013-05-31 (×2): 75 mg via ORAL
  Filled 2013-05-29 (×2): qty 1

## 2013-05-29 MED ORDER — SODIUM CHLORIDE 0.9 % IV SOLN
INTRAVENOUS | Status: AC
  Administered 2013-05-29: 23:00:00 via INTRAVENOUS

## 2013-05-29 MED ORDER — ACETAMINOPHEN 325 MG PO TABS
650.0000 mg | ORAL_TABLET | ORAL | Status: DC | PRN
  Administered 2013-05-31: 650 mg via ORAL
  Filled 2013-05-29: qty 2

## 2013-05-29 MED ORDER — ENOXAPARIN SODIUM 40 MG/0.4ML ~~LOC~~ SOLN
40.0000 mg | Freq: Every day | SUBCUTANEOUS | Status: DC
  Administered 2013-05-30: 40 mg via SUBCUTANEOUS
  Filled 2013-05-29 (×2): qty 0.4

## 2013-05-29 NOTE — ED Notes (Addendum)
Pt presents to department for evaluation of possible stroke. States she had numbness and weakness to L side of body yesterday. Friends at church this morning states her speech was slurred. Upon arrival strong equal bilateral grip strengths. Able to move all extremities. Pt is conscious alert and oriented x4. No facial droop noted. Denies pain. Pt also states she feels off balance today.

## 2013-05-29 NOTE — ED Notes (Signed)
Patient ambulated to restroom without difficulty with this RN.

## 2013-05-29 NOTE — ED Provider Notes (Signed)
CSN: [id number]     Arrival date & time 05/29/13  1658 History   First MD Initiated Contact with Patient 05/29/13 1714     Chief Complaint  Patient presents with  . Cerebrovascular Accident     (Consider location/radiation/quality/duration/timing/severity/associated sxs/prior Treatment) Patient is a 73 y.o. female presenting with neurologic complaint. The history is provided by the patient.  Neurologic Problem This is a new problem. The current episode started yesterday. The problem occurs constantly. The problem has been gradually improving. Associated symptoms include headaches. Pertinent negatives include no chest pain, no abdominal pain and no shortness of breath. Nothing aggravates the symptoms. Nothing relieves the symptoms. Treatments tried: excedrin. The treatment provided mild relief.    Past Medical History  Diagnosis Date  . Fibromyalgia    History reviewed. No pertinent past surgical history. No family history on file. History  Substance Use Topics  . Smoking status: Never Smoker   . Smokeless tobacco: Not on file  . Alcohol Use: No   OB History   Grav Para Term Preterm Abortions TAB SAB Ect Mult Living                 Review of Systems  Constitutional: Negative for fever and fatigue.  HENT: Negative for congestion and drooling.   Eyes: Negative for pain.  Respiratory: Negative for cough and shortness of breath.   Cardiovascular: Negative for chest pain.  Gastrointestinal: Negative for nausea, vomiting, abdominal pain and diarrhea.  Genitourinary: Negative for dysuria and hematuria.  Musculoskeletal: Negative for back pain, gait problem and neck pain.  Skin: Negative for color change.  Neurological: Positive for weakness and headaches. Negative for dizziness.       Altered sensation on the left side  Hematological: Negative for adenopathy.  Psychiatric/Behavioral: Negative for behavioral problems.  All other systems reviewed and are  negative.      Allergies  Aspirin and Relafen  Home Medications  No current outpatient prescriptions on file. BP 124/66  Pulse 68  Temp(Src) 97.8 F (36.6 C) (Oral)  Resp 20  SpO2 98% Physical Exam  Nursing note and vitals reviewed. Constitutional: She is oriented to person, place, and time. She appears well-developed and well-nourished.  HENT:  Head: Normocephalic.  Mouth/Throat: Oropharynx is clear and moist. No oropharyngeal exudate.  Eyes: Conjunctivae and EOM are normal. Pupils are equal, round, and reactive to light.  Neck: Normal range of motion. Neck supple.  Cardiovascular: Normal rate, regular rhythm, normal heart sounds and intact distal pulses.  Exam reveals no gallop and no friction rub.   No murmur heard. Pulmonary/Chest: Effort normal and breath sounds normal. No respiratory distress. She has no wheezes.  Abdominal: Soft. Bowel sounds are normal. There is no tenderness. There is no rebound and no guarding.  Musculoskeletal: Normal range of motion. She exhibits no edema and no tenderness.  Neurological: She is alert and oriented to person, place, and time.  alert, oriented x3 speech: normal in context and clarity memory: intact grossly cranial nerves II-XII: altered sensation to the left side of the face, no asymmetry noted, CN's otherwise intact motor strength: full proximally and distally no involuntary movements or tremors sensation: altered sensation to light touch in the left upper extremity and right lower extremity. cerebellar: finger-to-nose and heel-to-shin intact gait: normal forwards and backwards   Skin: Skin is warm and dry.  Psychiatric: She has a normal mood and affect. Her behavior is normal.    ED Course  Procedures (including critical care time) Labs  Review Labs Reviewed  COMPREHENSIVE METABOLIC PANEL - Abnormal; Notable for the following:    Glucose, Bld 101 (*)    Total Bilirubin 0.2 (*)    GFR calc non Af Amer 69 (*)    GFR calc  Af Amer 80 (*)    All other components within normal limits  HEMOGLOBIN A1C - Abnormal; Notable for the following:    Hemoglobin A1C 6.0 (*)    Mean Plasma Glucose 126 (*)    All other components within normal limits  URINALYSIS, ROUTINE W REFLEX MICROSCOPIC - Abnormal; Notable for the following:    Leukocytes, UA MODERATE (*)    All other components within normal limits  URINE MICROSCOPIC-ADD ON - Abnormal; Notable for the following:    Bacteria, UA FEW (*)    All other components within normal limits  BASIC METABOLIC PANEL - Abnormal; Notable for the following:    GFR calc non Af Amer 82 (*)    All other components within normal limits  GLUCOSE, CAPILLARY - Abnormal; Notable for the following:    Glucose-Capillary 166 (*)    All other components within normal limits  GLUCOSE, CAPILLARY - Abnormal; Notable for the following:    Glucose-Capillary 102 (*)    All other components within normal limits  GLUCOSE, CAPILLARY - Abnormal; Notable for the following:    Glucose-Capillary 106 (*)    All other components within normal limits  CBC WITH DIFFERENTIAL  URINE RAPID DRUG SCREEN (HOSP PERFORMED)  ETHANOL  PROTIME-INR  APTT  TROPONIN I  CBC  GLUCOSE, CAPILLARY  LIPID PANEL  GLUCOSE, CAPILLARY   Imaging Review Mri Brain Without Contrast  05/31/2013   CLINICAL DATA:  Left-sided headache and numbness, altered speech.  EXAM: MRI HEAD WITHOUT CONTRAST  MRA HEAD WITHOUT CONTRAST  TECHNIQUE: Multiplanar, multiecho pulse sequences of the brain and surrounding structures were obtained without intravenous contrast. Angiographic images of the head were obtained using MRA technique without contrast.  COMPARISON:  CT HEAD W/O CM dated 05/29/2013; MR MRA HEAD W/O CM dated 01/11/2008  FINDINGS: MRI HEAD FINDINGS  No reduced diffusion to suggest acute ischemia. No susceptibility artifact to suggest hemorrhage.  The ventricles and sulci are normal for patient's age. Scattered subcentimeter  supratentorial white matter T2 hyperintensities are seen, no midline shift or mass effect.  No abnormal extra-axial fluid collections. Visualized paranasal sinuses and mastoid air cells are well aerated. Severe right temporomandibular osteoarthrosis. Ocular globes and orbital contents are nonsuspicious though not tailored for evaluation. No abnormal sellar expansion. No cerebellar tonsillar ectopia. Somewhat bright T1 bone marrow signal may reflect osteopenia.  MRA HEAD FINDINGS  Anterior circulation: Normal flow related enhancement of the included cervical, petrous, cavernous and supra clinoid internal carotid arteries. Patent anterior communicating artery. Normal flow related enhancement of the anterior and middle cerebral arteries, including more distal segments.  Posterior circulation: Right vertebral artery is dominant. Basilar artery is patent, with normal flow related enhancement of the main branch vessels. Normal flow related enhancement of the posterior cerebral arteries. Robust right and smaller left patent posterior communicating artery's.  No large vessel occlusion, hemodynamically significant stenosis, abnormal luminal irregularity, aneurysm within the anterior nor posterior circulation.  IMPRESSION: MRI head: No acute intracranial process, specifically no evidence of acute ischemia.  Moderate white matter changes may reflect chronic small vessel ischemic disease, within normal range for patient's age though, advanced from prior examination.  MRA head:  Normal examination with complete circle of Willis.   Electronically Signed  By: Awilda Metroourtnay  Bloomer   On: 05/31/2013 04:49   Mr Cervical Spine Wo Contrast  05/31/2013   CLINICAL DATA:  Left-sided neck pain with numbness and tingling in hand.  EXAM: MRI CERVICAL SPINE WITHOUT CONTRAST  TECHNIQUE: Multiplanar, multisequence MR imaging was performed. No intravenous contrast was administered.  COMPARISON:  CT HEAD W/O CM dated 05/29/2013; CT C SPINE W/O CM  dated 01/09/2008  FINDINGS: Cervical vertebral bodies and posterior elements appear intact. Straightened cervical lordosis. Stable grade 1 C3-4 anterolisthesis on degenerative basis. Moderate to severe C4-5 through C6-7 T1 disc height loss, with decreased T2 signal within all cervical disc most consistent with moderate desiccation. Moderate to severe chronic discogenic endplate changes at C5-6 thru C6-7, moderate C4-5. No abnormal bone marrow signal to suggest acute osseous process though, the STIR sequences moderately motion degraded.  Cervical spinal cord appears normal in morphology and signal characteristics from the cervicomedullary junction to the level of T3-4, the most caudal well visualized level. Craniocervical junction appears intact. Included prevertebral and paraspinal soft tissues are nonsuspicious.  Level by level evaluation:  C2-3: No disc bulge. Moderate right, mild left facet arthropathy without canal stenosis or neural foraminal narrowing.  C3-4: Anterolisthesis. Uncovertebral hypertrophy, severe right and moderate left facet arthropathy without canal stenosis. Moderate right and mild-to-moderate left neural foraminal narrowing.  C4-5: Minimal annular bulging, uncovertebral hypertrophy and moderate to severe facet arthropathy. Mild canal stenosis. Severe bilateral neural foraminal narrowing.  C5-6: 3 mm broad-based disc bulge, asymmetric to left. Uncovertebral hypertrophy and mild facet arthropathy. Mild canal stenosis. Moderate to severe right, severe left neural foraminal narrowing.  C6-7: Annular bulging with uncovertebral hypertrophy. No canal stenosis. Moderate right and moderate to severe left neural foraminal narrowing.  C7-T1: 2 mm broad-based disc bulge asymmetric to left. Uncovertebral hypertrophy. No canal stenosis. Moderate left neural foraminal narrowing.  IMPRESSION: Straightened cervical lordosis with grade 1 C3-4 anterolisthesis on degenerative basis.  Mild canal stenosis C4-5 and  C5-6.  Neural foraminal narrowing C3-4 thru C7-T1: Severe bilaterally at C4-5 and severe on the left at C5-6.   Electronically Signed   By: Awilda Metroourtnay  Bloomer   On: 05/31/2013 06:15   Mr Maxine GlennMra Head/brain Wo Cm  05/31/2013   CLINICAL DATA:  Left-sided headache and numbness, altered speech.  EXAM: MRI HEAD WITHOUT CONTRAST  MRA HEAD WITHOUT CONTRAST  TECHNIQUE: Multiplanar, multiecho pulse sequences of the brain and surrounding structures were obtained without intravenous contrast. Angiographic images of the head were obtained using MRA technique without contrast.  COMPARISON:  CT HEAD W/O CM dated 05/29/2013; MR MRA HEAD W/O CM dated 01/11/2008  FINDINGS: MRI HEAD FINDINGS  No reduced diffusion to suggest acute ischemia. No susceptibility artifact to suggest hemorrhage.  The ventricles and sulci are normal for patient's age. Scattered subcentimeter supratentorial white matter T2 hyperintensities are seen, no midline shift or mass effect.  No abnormal extra-axial fluid collections. Visualized paranasal sinuses and mastoid air cells are well aerated. Severe right temporomandibular osteoarthrosis. Ocular globes and orbital contents are nonsuspicious though not tailored for evaluation. No abnormal sellar expansion. No cerebellar tonsillar ectopia. Somewhat bright T1 bone marrow signal may reflect osteopenia.  MRA HEAD FINDINGS  Anterior circulation: Normal flow related enhancement of the included cervical, petrous, cavernous and supra clinoid internal carotid arteries. Patent anterior communicating artery. Normal flow related enhancement of the anterior and middle cerebral arteries, including more distal segments.  Posterior circulation: Right vertebral artery is dominant. Basilar artery is patent, with normal flow related enhancement of the  main branch vessels. Normal flow related enhancement of the posterior cerebral arteries. Robust right and smaller left patent posterior communicating artery's.  No large vessel  occlusion, hemodynamically significant stenosis, abnormal luminal irregularity, aneurysm within the anterior nor posterior circulation.  IMPRESSION: MRI head: No acute intracranial process, specifically no evidence of acute ischemia.  Moderate white matter changes may reflect chronic small vessel ischemic disease, within normal range for patient's age though, advanced from prior examination.  MRA head:  Normal examination with complete circle of Willis.   Electronically Signed   By: Awilda Metro   On: 05/31/2013 04:49     EKG Interpretation   Date/Time:  Sunday May 29 2013 17:34:09 EDT Ventricular Rate:  60 PR Interval:  197 QRS Duration: 86 QT Interval:  381 QTC Calculation: 381 R Axis:   -20 Text Interpretation:  Sinus rhythm Left ventricular hypertrophy Anterior Q  waves, possibly due to LVH Baseline wander in lead(s) III aVF No  significant change since last tracing Confirmed by Antonio Woodhams  MD, Joesphine Schemm  ([id number]) on 05/29/2013 5:59:42 PM      MDM   Final diagnoses:  Left-sided weakness  Left sided numbness  Headache    5:50 PM 73 y.o. female with a history of a concussion several years ago, migraine, who presents with headache and left-sided weakness/numbness which began early yesterday afternoon. She notes that her headache continued today and she took 2 Excedrin with mild to moderate relief. She currently rates her headache as a 5/10. She is not on any medicine for her headache currently. At church this morning her friends noticed that she was having some mild slurring of speech. There is also some suspicion that she had some facial droop yesterday as well. She is afebrile and vital signs are unremarkable here. She notes a mildly worse instability when walking from baseline today.   Will admit for TIA workup.     Junius Argyle, MD 05/31/13 2013

## 2013-05-29 NOTE — H&P (Signed)
Patient's PCP: Alva GarnetSHELTON,KIMBERLY R., MD  Chief Complaint: Headache, slurred speech, and left-sided numbness  History of Present Illness: [name] is a 73 y.o. Caucasian female with history of fibromyalgia, migraines, hiatal hernia, and degenerative joint disease who presents with the above complaints.  Patient reports that her symptoms started yesterday afternoon, when she noted that she had a left-sided headache with left-sided numbness and perhaps left-sided weakness.  In the morning, she had difficulty eating her breakfast, she choked and vomited.  She was not sure if this was related to her prior to hernia, as she has frequent episodes of choking and vomiting.  She took Excedrin and Aleve yesterday to see if her symptoms would improve.  She slept yesterday afternoon, to see if she could sleep off her symptoms.  She woke up feeling better but with persistent symptoms.  She slept at 11 p.m. last night, she went to church.  Some of the members in her church noted that her speech was slurred.  As a result she was brought to the emergency department this evening for further evaluation.  She denies any recent fevers, chills, chest pain, shortness of breath, abdominal pain, diarrhea, or vision changes.  She does complain of having left neck/shoulder pain with some numbness and tingling to her left third, fourth, and fifth fingers which she has had since January of 2015, she has been evaluated by her primary care physician and was told that she may have a pinched nerve, she is uncertain if she has had any imaging studies to her neck.  Of note, patient's son notes that her speech is unchanged and she had some slurring at baseline.  Review of Systems: All systems reviewed with the patient and positive as per history of present illness, otherwise all other systems are negative.  Past Medical History  Diagnosis Date  . Fibromyalgia   . DJD (degenerative joint disease)   . Hiatal hernia   . Migraine  headache    History reviewed. No pertinent past surgical history. Family History  Problem Relation Age of Onset  . CVA Mother    History   Social History  . Marital Status: Married    Spouse Name: N/A    Number of Children: N/A  . Years of Education: N/A   Occupational History  . Not on file.   Social History Main Topics  . Smoking status: Never Smoker   . Smokeless tobacco: Not on file  . Alcohol Use: No  . Drug Use: No  . Sexual Activity: Not on file   Other Topics Concern  . Not on file   Social History Narrative  . No narrative on file   Allergies: Aspirin; Elavil; and Relafen  Home Meds: Prior to Admission medications   Medication Sig Start Date End Date Taking? Authorizing Provider  aspirin-acetaminophen-caffeine (EXCEDRIN MIGRAINE) 256 [id number] [id number] MG per tablet Take by mouth every 6 (six) hours as needed for headache.   Yes Historical Provider, MD  ibuprofen (ADVIL,MOTRIN) 200 MG tablet Take 200 mg by mouth every 6 (six) hours as needed for mild pain.    Yes Historical Provider, MD  naproxen sodium (ANAPROX) 220 MG tablet Take 440 mg by mouth 2 (two) times daily with a meal.    Yes Historical Provider, MD    Physical Exam: Blood pressure 121/62, pulse 60, temperature 98.1 F (36.7 C), temperature source Oral, resp. rate 18, SpO2 98.00%. General: Awake, Oriented x3, No acute distress. HEENT: EOMI, Moist mucous membranes, speech slightly slurred. Neck:  Supple CV: S1 and S2 Lungs: Clear to ascultation bilaterally Abdomen: Soft, Nontender, Nondistended, +bowel sounds. Ext: Good pulses. Trace edema. No clubbing or cyanosis noted. Neuro: Cranial Nerves II-XII grossly intact. Has 5/5 motor strength in upper and lower extremities.  Lab results:  Recent Labs  05/29/13 1729  NA 140  K 4.0  CL 100  CO2 28  GLUCOSE 101*  BUN 17  CREATININE 0.83  CALCIUM 9.4    Recent Labs  05/29/13 1729  AST 13  ALT 9  ALKPHOS 72  BILITOT 0.2*  PROT 6.6  ALBUMIN 3.8    No results found for this basename: LIPASE, AMYLASE,  in the last 72 hours  Recent Labs  05/29/13 1729  WBC 5.9  NEUTROABS 4.2  HGB 14.0  HCT 40.4  MCV 91.4  PLT 179    Recent Labs  05/29/13 1752  TROPONINI <0.30   No components found with this basename: POCBNP,  No results found for this basename: DDIMER,  in the last 72 hours No results found for this basename: HGBA1C,  in the last 72 hours No results found for this basename: CHOL, HDL, LDLCALC, TRIG, CHOLHDL, LDLDIRECT,  in the last 72 hours No results found for this basename: TSH, T4TOTAL, FREET3, T3FREE, THYROIDAB,  in the last 72 hours No results found for this basename: VITAMINB12, FOLATE, FERRITIN, TIBC, IRON, RETICCTPCT,  in the last 72 hours Imaging results:  Ct Head Wo Contrast  05/29/2013   CLINICAL DATA:  Headache, left-sided numbness and tingling which began yesterday  EXAM: CT HEAD WITHOUT CONTRAST  TECHNIQUE: Contiguous axial images were obtained from the base of the skull through the vertex without intravenous contrast.  COMPARISON:  01/09/2008  FINDINGS: Normal ventricular morphology.  Generalized atrophy.  No midline shift or mass effect.  Mild small vessel chronic ischemic changes of deep cerebral white matter.  No intracranial hemorrhage, mass lesion, or evidence acute infarction.  No extra-axial fluid collections.  Bones and sinuses unremarkable.  IMPRESSION: Atrophy with mild small vessel chronic ischemic changes of deep cerebral white matter.  No acute intracranial abnormalities.   Electronically Signed   By: Ulyses Southward M.D.   On: 05/29/2013 18:39   Other results: EKG: Sinus rhythm with heart rate of 60.  Assessment & Plan by Problem: Left-sided numbness/weakness Improved.  Unclear etiology.  Uncertain if this is related to her migraines.  Given patient's age, initiate TIA/CVA workup.  Will get an MRI/MRA of the brain/neck, carotid Dopplers, and 2-D echocardiogram.  Patient has an allergy to aspirin,  which will start the patient on Plavix for now.  Check hemoglobin A1c and lipid panel in the morning to risk stratify the patient.  Continue neuro checks.  Blood pressure stable.  Will gently hydrate the patient on normal saline.  Chronic left-sided neck pain Due to the above factors, will get an MRI of her C-spine for further evaluation.  Requests PT/OT evaluation.  Fibromyalgia Stable.  Per family, has been on Cymbalta in the past, which patient is currently not on this medication.  Migraine/headache Stable.  Continue to monitor.  Primary care physician to address this as outpatient.  Prophylaxis Lovenox.  CODE STATUS Full code.  Disposition Admit the patient to telemetry as observation.  Time spent on admission, talking to the patient, and coordinating care was: 50 mins.  Aryah Doering A, MD 05/29/2013, 8:35 PM

## 2013-05-30 ENCOUNTER — Observation Stay (HOSPITAL_COMMUNITY): Payer: Medicare Other

## 2013-05-30 DIAGNOSIS — R4789 Other speech disturbances: Secondary | ICD-10-CM

## 2013-05-30 DIAGNOSIS — G459 Transient cerebral ischemic attack, unspecified: Secondary | ICD-10-CM

## 2013-05-30 LAB — BASIC METABOLIC PANEL
BUN: 16 mg/dL (ref 6–23)
CHLORIDE: 107 meq/L (ref 96–112)
CO2: 28 meq/L (ref 19–32)
CREATININE: 0.79 mg/dL (ref 0.50–1.10)
Calcium: 9.1 mg/dL (ref 8.4–10.5)
GFR calc Af Amer: >90 mL/min (ref >90)
GFR calc non Af Amer: 82 mL/min — ABNORMAL LOW (ref >90)
Glucose, Bld: 95 mg/dL (ref 70–99)
Potassium: 4.4 mEq/L (ref 3.7–5.3)
Sodium: 144 mEq/L (ref 137–147)

## 2013-05-30 LAB — LIPID PANEL
Cholesterol: 138 mg/dL (ref 0–200)
HDL: 53 mg/dL (ref >39)
LDL Cholesterol: 68 mg/dL (ref 0–99)
TRIGLYCERIDES: 87 mg/dL (ref <150)
Total CHOL/HDL Ratio: 2.6 RATIO
VLDL: 17 mg/dL (ref 0–40)

## 2013-05-30 LAB — CBC
HEMATOCRIT: 40.6 % (ref 36.0–46.0)
Hemoglobin: 13.6 g/dL (ref 12.0–15.0)
MCH: 30.6 pg (ref 26.0–34.0)
MCHC: 33.5 g/dL (ref 30.0–36.0)
MCV: 91.2 fL (ref 78.0–100.0)
PLATELETS: 175 10*3/uL (ref 150–400)
RBC: 4.45 MIL/uL (ref 3.87–5.11)
RDW: 12.9 % (ref 11.5–15.5)
WBC: 4.9 10*3/uL (ref 4.0–10.5)

## 2013-05-30 LAB — GLUCOSE, CAPILLARY
Glucose-Capillary: 95 mg/dL (ref 70–99)
Glucose-Capillary: 166 mg/dL — ABNORMAL HIGH (ref 70–99)
Glucose-Capillary: 102 mg/dL — ABNORMAL HIGH (ref 70–99)

## 2013-05-30 LAB — HEMOGLOBIN A1C
Hgb A1c MFr Bld: 6 % — ABNORMAL HIGH (ref <5.7)
Mean Plasma Glucose: 126 mg/dL — ABNORMAL HIGH (ref <117)

## 2013-05-30 MED ORDER — DIPHENHYDRAMINE HCL 25 MG PO CAPS: 25.0000 mg | ORAL_CAPSULE | Freq: Every evening | ORAL | Status: DC | PRN

## 2013-05-30 MED ORDER — ZOLPIDEM TARTRATE 5 MG PO TABS: 5.0000 mg | ORAL_TABLET | Freq: Every evening | ORAL | Status: DC | PRN

## 2013-05-30 MED ORDER — STROKE: EARLY STAGES OF RECOVERY BOOK
Freq: Once | Status: AC
  Administered 2013-05-30: 08:00:00
  Filled 2013-05-30: qty 1

## 2013-05-30 NOTE — Evaluation (Signed)
Physical Therapy Evaluation Patient Details Name: [name] MRN: [id number] DOB: 23-Oct-1940 Today's Date: 05/30/2013 Time: [id number] PT Time Calculation (min): 20 min  PT Assessment / Plan / Recommendation History of Present Illness  73 y.o. female [facility]13 with left sided weakness and numbness as well as slurred speech.  She has significant PMHx of fibromyalgia, R wrist carpal tunnel, DJD, hiatial hernia, migraines, and chronic left sided neck pain.  CT negative for acute event.  MRI still pending.    Clinical Impression  Pt is independent with all mobility with no signs of imbalance with level surface gait.  Her speech still sounds a little slurred.  Functional strength seems good with no signs of asymmetry left vs right leg.  PT to sign off.      PT Assessment  Patent does not need any further PT services    Follow Up Recommendations  No PT follow up    Does the patient have the potential to tolerate intense rehabilitation     NA  Barriers to Discharge   None      Equipment Recommendations  None recommended by PT    Recommendations for Other Services   None  Frequency   NA- one time eval and d/c   Precautions / Restrictions   None  Pertinent Vitals/Pain See vitals flow sheet.       Mobility  Bed Mobility Overal bed mobility: Independent Transfers Overall transfer level: Independent Equipment used: None Ambulation/Gait Ambulation/Gait assistance: Independent Ambulation Distance (Feet): 250 Feet Assistive device: None Gait Pattern/deviations: WFL(Within Functional Limits) Stairs: Yes Stairs assistance: Modified independent (Device/Increase time) Stair Management: One rail Right;Alternating pattern;Step to pattern;Forwards (alt up, step to down) Number of Stairs: 3 (limited by IV line) Modified Rankin (Stroke Patients Only) Pre-Morbid Rankin Score: No symptoms Modified Rankin: No significant disability        PT Goals(Current goals  can be found in the care plan section) Acute Rehab PT Goals Patient Stated Goal: to go home and have a full recovery PT Goal Formulation: No goals set, d/c therapy  Visit Information  Last PT Received On: 05/30/13 Assistance Needed: +1 History of Present Illness: 73 y.o. [facility] with left sided weakness and numbness as well as slurred speech.  She has significant PMHx of fibromyalgia, R wrist carpal tunnel, DJD, hiatial hernia, migraines, and chronic left sided neck pain.  CT negative for acute event.  MRI still pending.         Prior Functioning  Home Living Family/patient expects to be discharged to:: Private residence Living Arrangements: Alone Available Help at Discharge: Family;Available PRN/intermittently Type of Home: House Home Access: Stairs to enter Enter[employer] CorporationEntrance Stairs-Number of Steps: 3 Entrance Stairs-Rails: None Home Layout: One level Home Equipment: Walker - 2 wheels;Cane - single point;Shower seat;Wheelchair - manual;Grab bars - tub/shower Additional Comments: wears glasses to read and to drive. Has a tub/shower combo.   Prior Function Level of Independence: Independent Comments: at times uses an assistive device if she feels like she needs it.   Communication Communication: No difficulties Dominant Hand: Right    Cognition  Cognition Arousal/Alertness: Awake/alert Behavior During Therapy: WFL for tasks assessed/performed Overall Cognitive Status: Within Functional Limits for tasks assessed    Extremity/Trunk Assessment Upper Extremity Assessment Upper Extremity Assessment: Defer to OT evaluation (pt reports carpal tunnel on the right side, L wrist swell) Lower Extremity Assessment Lower Extremity Assessment: Overall WFL for tasks assessed (WNL sensation and no obvious  strength deficits) Cervical / Trunk Assessment Cervical / Trunk Assessment: Other exceptions Cervical / Trunk Exceptions: left sided neck pain.     Balance  Balance Overall balance assessment: No apparent balance deficits (not formally assessed) General Comments General comments (skin integrity, edema, etc.): Stroke education completed inculding signs and symptoms of stroke and importance of acting fast (not sleeping it off like she did this time).  Pt reports that other staff have reviewed this with her and she verbailized understanding.   End of Session PT - End of Session Activity Tolerance: Patient tolerated treatment well Patient left: in bed;with call bell/phone within reach;with nursing/sitter in room (seated EOB with RN) Nurse Communication: Mobility status;Other (comment) (ok for pt to go back and forth to the bathroom by herself. )  GP Functional Assessment Tool Used: assist level Functional Limitation: Mobility: Walking and moving around Mobility: Walking and Moving Around Current Status (W0981): 0 percent impaired, limited or restricted Mobility: Walking and Moving Around Goal Status (X9147): 0 percent impaired, limited or restricted Mobility: Walking and Moving Around Discharge Status [phone]): 0 percent impaired, limited or restricted   Kewanda Poland B. Frank Novelo, PT, DPT [phone]   05/30/2013, 10:39 AM

## 2013-05-30 NOTE — Progress Notes (Signed)
VASCULAR LAB PRELIMINARY  PRELIMINARY  PRELIMINARY  PRELIMINARY  Carotid duplex  completed.    Preliminary report:  Bilateral:  1-39% ICA stenosis.  Vertebral artery flow is antegrade.      Warwick Nick, RVT 05/30/2013, 11:14 AM

## 2013-05-30 NOTE — Evaluation (Signed)
Occupational Therapy Evaluation Patient Details Name: Ofilia NeasLinda K Mccorkel MRN: [id number] DOB: [birth year] Today's Date: 05/30/2013 Time: [id number] OT Time Calculation (min): 38 min  OT Assessment / Plan / Recommendation History of present illness 73 y.o. female [facility] with left sided weakness and numbness as well as slurred speech.  She has significant PMHx of fibromyalgia, R wrist carpal tunnel, DJD, hiatial hernia, migraines, and chronic left sided neck pain.  CT negative for acute event.  MRI still pending.     Clinical Impression  Pt. Does not present with any OT needs. Pt. Does not have any L sided weakness or any coordination difficulties in L UE. Pt. Is able to perform ADLs and mobility at baseline.     OT Assessment  Patient does not need any further OT services    Follow Up Recommendations  No OT follow up    Barriers to Discharge      Equipment Recommendations  None recommended by OT    Recommendations for Other Services    Frequency       Precautions / Restrictions     Pertinent Vitals/Pain No c/o    ADL  Eating/Feeding: Simulated;Independent Where Assessed - Eating/Feeding: Edge of bed Grooming: Performed;Wash/dry hands;Wash/dry face;Modified independent Where Assessed - Grooming: Unsupported standing Upper Body Bathing: Simulated;Independent Where Assessed - Upper Body Bathing: Unsupported sitting Lower Body Bathing: Simulated;Modified independent Where Assessed - Lower Body Bathing: Unsupported sitting;Unsupported standing Upper Body Dressing: Simulated;Independent Where Assessed - Upper Body Dressing: Unsupported sitting Lower Body Dressing: Performed;Independent Where Assessed - Lower Body Dressing: Unsupported sitting;Unsupported standing Toilet Transfer: Performed;Independent Toilet Transfer Method: Surveyor, mineralstand pivot Toilet Transfer Equipment: Comfort height toilet Toileting - ArchitectClothing Manipulation and Hygiene: Performed;Independent Where  Assessed - Toileting Clothing Manipulation and Hygiene: Standing Tub/Shower Transfer: Simulated;Independent Tub/Shower Transfer Method: Stand pivot Transfers/Ambulation Related to ADLs: Pt. is AMB without AE and is doing well. ADL Comments: No deficits noted with ADLs    OT Diagnosis:    OT Problem List:   OT Treatment Interventions:     OT Goals(Current goals can be found in the care plan section) Acute Rehab OT Goals Patient Stated Goal: go home  Visit Information  Last OT Received On: 05/30/13 Assistance Needed: +1 History of Present Illness: 73 y.o. [facility] with left sided weakness and numbness as well as slurred speech.  She has significant PMHx of fibromyalgia, R wrist carpal tunnel, DJD, hiatial hernia, migraines, and chronic left sided neck pain.  CT negative for acute event.  MRI still pending.         Prior Functioning     Home Living Family/patient expects to be discharged to:: Private residence Living Arrangements: Alone Available Help at Discharge: Family;Available PRN/intermittently Type of Home: House Home Access: Stairs to enter [employer] CorporationEntrance Stairs-Number of Steps: 3 Entrance Stairs-Rails: None Home Layout: One level Home Equipment: Walker - 2 wheels;Cane - single point;Shower seat;Wheelchair - manual;Grab bars - tub/shower Additional Comments: wears glasses to read and to drive. Has a tub/shower combo.   Prior Function Level of Independence: Independent Comments: at times uses an assistive device if she feels like she needs it.   Communication Communication: No difficulties Dominant Hand: Right         Vision/Perception Vision - History Baseline Vision: No visual deficits Patient Visual Report: No change from baseline   Cognition  Cognition Arousal/Alertness: Awake/alert Behavior During Therapy: WFL for tasks assessed/performed Overall Cognitive Status: Within Functional Limits for tasks assessed  Extremity/Trunk  Assessment Upper Extremity Assessment Upper Extremity Assessment: Overall WFL for tasks assessed Lower Extremity Assessment Lower Extremity Assessment: Overall WFL for tasks assessed (WNL sensation and no obvious strength deficits) Cervical / Trunk Assessment Cervical / Trunk Assessment: Other exceptions Cervical / Trunk Exceptions: left sided neck pain.       Mobility Bed Mobility Overal bed mobility: Independent Transfers Overall transfer level: Independent Equipment used: None     Exercise     Balance Balance Overall balance assessment: No apparent balance deficits (not formally assessed) General Comments General comments (skin integrity, edema, etc.): Stroke education completed inculding signs and symptoms of stroke and importance of acting fast (not sleeping it off like she did this time).  Pt reports that other staff have reviewed this with her and she verbailized understanding.    End of Session OT - End of Session Activity Tolerance: Patient tolerated treatment well Patient left: in bed;with call bell/phone within reach Nurse Communication:  (nurse OK'ed working with pt.)  GO Functional Limitation: Self care Self Care Current Status [phone]): 0 percent impaired, limited or restricted Self Care Goal Status (U0454): 0 percent impaired, limited or restricted Self Care Discharge Status [phone]): 0 percent impaired, limited or restricted   Davi Rotan 05/30/2013, 11:38 AM

## 2013-05-30 NOTE — Progress Notes (Signed)
Echo Lab  2D Echocardiogram completed.  [name] Megann Easterwood, RDCS 05/30/2013 3:19 PM

## 2013-05-30 NOTE — Progress Notes (Signed)
UR completed 

## 2013-05-30 NOTE — Progress Notes (Signed)
PROGRESS NOTE  [name] ZOX:[id number] DOB: 21-Nov-1940 DOA: 05/29/2013 PCP: Alva Garnet., MD  Assessment/Plan: Left-sided numbness/weakness  Improved, TIA/CVA workup including MRI/MRA of the brain/neck, carotid Dopplers, and 2-D echocardiogram. Patient has an allergy to aspirin, continue Plavix for now.  - Lipid panel LDL 68, HDL 53 - A1C 6.0 Chronic left-sided neck pain  Due to the above factors, will get an MRI of her C-spine for further evaluation. Requests PT/OT evaluation.  Fibromyalgia  Stable. Per family, has been on Cymbalta in the past, which patient is currently not on this medication.  Migraine/headache  Stable. Continue to monitor. Primary care physician to address this as outpatient.  Prophylaxis  Lovenox.  Diet: heart healthy Fluids: NS DVT Prophylaxis: Lovenox  Code Status: Full Family Communication: son at the bedside  Disposition Plan: home when ready  Consultants:  none  Procedures:  none   Antibiotics - none  HPI/Subjective: - without complaints this morning, left sided numbness resolved.   Objective: Filed Vitals:   05/29/13 1954 05/29/13 2133 05/30/13 0000 05/30/13 0400  BP: 121/62 128/65 112/57 120/53  Pulse: 60 63 63 62  Temp: 98.1 F (36.7 C) 97.9 F (36.6 C) 98.2 F (36.8 C) 97.9 F (36.6 C)  TempSrc: Oral Oral Oral Oral  Resp: 18 16 16 16   Height:  5\' 4"  (1.626 m)    Weight:  81.1 kg (178 lb 12.7 oz)    SpO2: 98% 96% 97% 97%   No intake or output data in the 24 hours ending 05/30/13 0716 Filed Weights   05/29/13 2133  Weight: 81.1 kg (178 lb 12.7 oz)    Exam:   General:  NAD  Cardiovascular: regular rate and rhythm, without MRG  Respiratory: good air movement, clear to auscultation throughout, no wheezing, ronchi or rales  Abdomen: soft, not tender to palpation, positive bowel sounds  MSK: no peripheral edema  Neuro: CN 2-12 grossly intact, MS 5/5 in all 4, mild slurred speech (per son this might be  chronic)  Data Reviewed: Basic Metabolic Panel:  Recent Labs Lab 05/29/13 1729  NA 140  K 4.0  CL 100  CO2 28  GLUCOSE 101*  BUN 17  CREATININE 0.83  CALCIUM 9.4   Liver Function Tests:  Recent Labs Lab 05/29/13 1729  AST 13  ALT 9  ALKPHOS 72  BILITOT 0.2*  PROT 6.6  ALBUMIN 3.8   CBC:  Recent Labs Lab 05/29/13 1729  WBC 5.9  NEUTROABS 4.2  HGB 14.0  HCT 40.4  MCV 91.4  PLT 179   Cardiac Enzymes:  Recent Labs Lab 05/29/13 1752  TROPONINI <0.30   CBG:  Recent Labs Lab 05/29/13 2145  GLUCAP 86   Studies: Ct Head Wo Contrast  05/29/2013   CLINICAL DATA:  Headache, left-sided numbness and tingling which began yesterday  EXAM: CT HEAD WITHOUT CONTRAST  TECHNIQUE: Contiguous axial images were obtained from the base of the skull through the vertex without intravenous contrast.  COMPARISON:  01/09/2008  FINDINGS: Normal ventricular morphology.  Generalized atrophy.  No midline shift or mass effect.  Mild small vessel chronic ischemic changes of deep cerebral white matter.  No intracranial hemorrhage, mass lesion, or evidence acute infarction.  No extra-axial fluid collections.  Bones and sinuses unremarkable.  IMPRESSION: Atrophy with mild small vessel chronic ischemic changes of deep cerebral white matter.  No acute intracranial abnormalities.   Electronically Signed   By: Ulyses Southward M.D.   On: 05/29/2013 18:39   Scheduled Meds: .  stroke: mapping our early stages of recovery book   Does not apply Once  . clopidogrel  75 mg Oral Q breakfast  . enoxaparin (LOVENOX) injection  40 mg Subcutaneous Daily   Continuous Infusions: . sodium chloride 50 mL/hr at 05/29/13 2308   Principal Problem:   Headache Active Problems:   Left-sided weakness   Left sided numbness   Fibromyalgia   TIA (transient ischemic attack)   Neck pain on left side  Time spent: 35  This note has been created with Education officer, environmentalDragon speech recognition software and smart phrase technology. Any  transcriptional errors are unintentional.   Pamella Pertostin Zoua Caporaso, MD [facility] Pager [phone]. If 7 PM - 7 AM, please contact night-coverage at [email or web address], password [email or web address] 05/30/2013, 7:16 AM  LOS: 1 day

## 2013-05-31 DIAGNOSIS — IMO0001 Reserved for inherently not codable concepts without codable children: Secondary | ICD-10-CM

## 2013-05-31 DIAGNOSIS — G43909 Migraine, unspecified, not intractable, without status migrainosus: Secondary | ICD-10-CM

## 2013-05-31 LAB — GLUCOSE, CAPILLARY: GLUCOSE-CAPILLARY: 106 mg/dL — AB (ref 70–99)

## 2013-05-31 MED ORDER — ATORVASTATIN CALCIUM 20 MG PO TABS: 20.0000 mg | ORAL_TABLET | Freq: Every day | ORAL | Status: DC

## 2013-05-31 MED ORDER — CLOPIDOGREL BISULFATE 75 MG PO TABS: 75.0000 mg | ORAL_TABLET | Freq: Every day | ORAL | Status: DC

## 2013-05-31 NOTE — Discharge Instructions (Signed)
STROKE/TIA DISCHARGE INSTRUCTIONS SMOKING Cigarette smoking nearly doubles your risk of having a stroke & is the single most alterable risk factor  If you smoke or have smoked in the last 12 months, you are advised to quit smoking for your health.  Most of the excess cardiovascular risk related to smoking disappears within a year of stopping.  Ask you doctor about anti-smoking medications  [place] Quit Line: 1-800-QUIT NOW  Free Smoking Cessation Classes [phone]  CHOLESTEROL Know your levels; limit fat & cholesterol in your diet  Lipid Panel     Component Value Date/Time   CHOL 138 05/30/2013 0629   TRIG 87 05/30/2013 0629   HDL 53 05/30/2013 0629   CHOLHDL 2.6 05/30/2013 0629   VLDL 17 05/30/2013 0629   LDLCALC 68 05/30/2013 0629      Many patients benefit from treatment even if their cholesterol is at goal.  Goal: Total Cholesterol (CHOL) less than 160  Goal:  Triglycerides (TRIG) less than 150  Goal:  HDL greater than 40  Goal:  LDL (LDLCALC) less than 100   BLOOD PRESSURE American Stroke Association blood pressure target is less that 120/80 mm/Hg  Your discharge blood pressure is:  BP: 126/91 mmHg  Monitor your blood pressure  Limit your salt and alcohol intake  Many individuals will require more than one medication for high blood pressure  DIABETES (A1c is a blood sugar average for last 3 months) Goal HGBA1c is under 7% (HBGA1c is blood sugar average for last 3 months)  Diabetes: Diagnosis of diabetes:  Your A1c:6.0 %    Lab Results  Component Value Date   HGBA1C 6.0* 05/29/2013     Your HGBA1c can be lowered with medications, healthy diet, and exercise.  Check your blood sugar as directed by your physician  Call your physician if you experience unexplained or low blood sugars.  PHYSICAL ACTIVITY/REHABILITATION Goal is 30 minutes at least 4 days per week  Activity: Increase activity slowly, Therapies: Physical Therapy: No F/U Return to work: N/A  Activity  decreases your risk of heart attack and stroke and makes your heart stronger.  It helps control your weight and blood pressure; helps you relax and can improve your mood.  Participate in a regular exercise program.  Talk with your doctor about the best form of exercise for you (dancing, walking, swimming, cycling).  DIET/WEIGHT Goal is to maintain a healthy weight  Your discharge diet is: Cardiac thin liquids Your height is:  Height: 5\' 4"  (162.6 cm) Your current weight is: Weight: 81.1 kg (178 lb 12.7 oz) Your Body Mass Index (BMI) is:  BMI (Calculated): 30.8  Following the type of diet specifically designed for you will help prevent another stroke.  Your goal weight range is:  108-132  Your goal Body Mass Index (BMI) is 19-24.  Healthy food habits can help reduce 3 risk factors for stroke:  High cholesterol, hypertension, and excess weight.  RESOURCES Stroke/Support Group:  Call [phone]   STROKE EDUCATION PROVIDED/REVIEWED AND GIVEN TO PATIENT Stroke warning signs and symptoms How to activate emergency medical system (call 911). Medications prescribed at discharge. Need for follow-up after discharge. Personal risk factors for stroke. Pneumonia vaccine given: No Flu vaccine given: No My questions have been answered, the writing is legible, and I understand these instructions.  I will adhere to these goals & educational materials that have been provided to me after my discharge from the hospital.

## 2013-05-31 NOTE — Discharge Summary (Signed)
Physician Discharge Summary  [name] ZOX:[id number] DOB: 1940/05/27 DOA: 05/29/2013  PCP: Alva Garnet., MD  Admit date: 05/29/2013 Discharge date: 05/31/2013  Time spent: 35 minutes  Recommendations for Outpatient Follow-up:  1. Repeat CBC and BMp in 2 weeks  Discharge Diagnoses:  Principal Problem:   Headache Active Problems:   Left-sided weakness   Left sided numbness   Fibromyalgia   TIA (transient ischemic attack)   Neck pain on left side   Discharge Condition: Stable  Diet recommendation: Heart Healthy  Filed Weights   05/29/13 2133  Weight: 81.1 kg (178 lb 12.7 oz)    History of present illness:  [name] is a 73 y.o. Caucasian female with history of fibromyalgia, migraines, hiatal hernia, and degenerative joint disease who presents with the above complaints. Patient reports that her symptoms started yesterday afternoon, when she noted that she had a left-sided headache with left-sided numbness and perhaps left-sided weakness. In the morning, she had difficulty eating her breakfast, she choked and vomited. She was not sure if this was related to her prior to hernia, as she has frequent episodes of choking and vomiting. She took Excedrin and Aleve yesterday to see if her symptoms would improve. She slept yesterday afternoon, to see if she could sleep off her symptoms. She woke up feeling better but with persistent symptoms. She slept at 11 p.m. last night, she went to church. Some of the members in her church noted that her speech was slurred. As a result she was brought to the emergency department this evening for further evaluation. She denies any recent fevers, chills, chest pain, shortness of breath, abdominal pain, diarrhea, or vision changes. She does complain of having left neck/shoulder pain with some numbness and tingling to her left third, fourth, and fifth fingers which she has had since January of 2015, she has been evaluated by her primary care  physician and was told that she may have a pinched nerve, she is uncertain if she has had any imaging studies to her neck. Of note, patient's son notes that her speech is unchanged and she had some slurring at baseline.  Hospital Course:  Patient is a pleasant 73 year old female with a past medical history of fibromyalgia, migraine headaches, admitted to the medicine service on 05/27/2013 presenting with sudden onset slurred speech, left-sided numbness. She was admitted to telemetry where she underwent TIA/CVA workup. Patient's symptoms last less than 24 hours. Initial CT scan of brain without contrast showed no acute intracranial abnormalities. This was followed up with an MRI of brain which did not reveal an acute intracranial process, no evidence of acute ischemia. Transthoracic echocardiogram showed an ejection fraction of 55-60% without wall motion abnormalities. Given aspirin allergy she will start on Plavix any 5 mg by mouth daily. Fasting lipid panel showed an LDL of 68. I discussed case with neurology who did not recommend discharging patient on statin therapy. She was discharged in stable condition to her home on 05/31/2013. Suspect symptoms related to transient ischemic attack.  Procedures:  Transthoracic echocardiogram performed on 05/30/2013 showing ejection fraction of 55-60% without wall motion abnormalities  Consultations:  Telephone conversation made to neurology, Dr. Amada Jupiter  Discharge Exam: Filed Vitals:   05/31/13 0805  BP: 126/91  Pulse: 75  Temp: 97.8 F (36.6 C)  Resp: 16    General: Patient is in no acute distress, awake alert oriented Cardiovascular: Regular rate rhythm normal S1-S2 Respiratory: Clear to auscultation bilaterally Abdomen: Soft nontender nondistended Neurological: No focal  Discharge Instructions  Discharge Orders   Future Orders Complete By Expires   Call MD for:  difficulty breathing, headache or visual disturbances  As directed    Call  MD for:  extreme fatigue  As directed    Call MD for:  hives  As directed    Call MD for:  persistant dizziness or light-headedness  As directed    Call MD for:  persistant nausea and vomiting  As directed    Call MD for:  redness, tenderness, or signs of infection (pain, swelling, redness, odor or green/yellow discharge around incision site)  As directed    Call MD for:  severe uncontrolled pain  As directed    Diet - low sodium heart healthy  As directed    Increase activity slowly  As directed        Medication List    STOP taking these medications       naproxen sodium 220 MG tablet  Commonly known as:  ANAPROX      TAKE these medications       aspirin-acetaminophen-caffeine 250-250-65 MG per tablet  Commonly known as:  EXCEDRIN MIGRAINE  Take by mouth every 6 (six) hours as needed for headache.     clopidogrel 75 MG tablet  Commonly known as:  PLAVIX  Take 1 tablet (75 mg total) by mouth daily with breakfast.     ibuprofen 200 MG tablet  Commonly known as:  ADVIL,MOTRIN  Take 200 mg by mouth every 6 (six) hours as needed for mild pain.       Allergies  Allergen Reactions  . Aspirin Anaphylaxis and Hives  . Elavil [Amitriptyline] Anaphylaxis  . Relafen [Nabumetone] Hives       Follow-up Information   Follow up with Alva GarnetSHELTON,KIMBERLY R., MD In 1 week.   Specialty:  Internal Medicine   Contact information:   [address] [place] [address] [phone]        The results of significant diagnostics from this hospitalization (including imaging, microbiology, ancillary and laboratory) are listed below for reference.    Significant Diagnostic Studies: Ct Head Wo Contrast  05/29/2013   CLINICAL DATA:  Headache, left-sided numbness and tingling which began yesterday  EXAM: CT HEAD WITHOUT CONTRAST  TECHNIQUE: Contiguous axial images were obtained from the base of the skull through the vertex without intravenous contrast.  COMPARISON:  01/09/2008   FINDINGS: Normal ventricular morphology.  Generalized atrophy.  No midline shift or mass effect.  Mild small vessel chronic ischemic changes of deep cerebral white matter.  No intracranial hemorrhage, mass lesion, or evidence acute infarction.  No extra-axial fluid collections.  Bones and sinuses unremarkable.  IMPRESSION: Atrophy with mild small vessel chronic ischemic changes of deep cerebral white matter.  No acute intracranial abnormalities.   Electronically Signed   By: Ulyses SouthwardMark  Boles M.D.   On: 05/29/2013 18:39   Mri Brain Without Contrast  05/31/2013   CLINICAL DATA:  Left-sided headache and numbness, altered speech.  EXAM: MRI HEAD WITHOUT CONTRAST  MRA HEAD WITHOUT CONTRAST  TECHNIQUE: Multiplanar, multiecho pulse sequences of the brain and surrounding structures were obtained without intravenous contrast. Angiographic images of the head were obtained using MRA technique without contrast.  COMPARISON:  CT HEAD W/O CM dated 05/29/2013; MR MRA HEAD W/O CM dated 01/11/2008  FINDINGS: MRI HEAD FINDINGS  No reduced diffusion to suggest acute ischemia. No susceptibility artifact to suggest hemorrhage.  The ventricles and sulci are normal for patient's age. Scattered  subcentimeter supratentorial white matter T2 hyperintensities are seen, no midline shift or mass effect.  No abnormal extra-axial fluid collections. Visualized paranasal sinuses and mastoid air cells are well aerated. Severe right temporomandibular osteoarthrosis. Ocular globes and orbital contents are nonsuspicious though not tailored for evaluation. No abnormal sellar expansion. No cerebellar tonsillar ectopia. Somewhat bright T1 bone marrow signal may reflect osteopenia.  MRA HEAD FINDINGS  Anterior circulation: Normal flow related enhancement of the included cervical, petrous, cavernous and supra clinoid internal carotid arteries. Patent anterior communicating artery. Normal flow related enhancement of the anterior and middle cerebral arteries,  including more distal segments.  Posterior circulation: Right vertebral artery is dominant. Basilar artery is patent, with normal flow related enhancement of the main branch vessels. Normal flow related enhancement of the posterior cerebral arteries. Robust right and smaller left patent posterior communicating artery's.  No large vessel occlusion, hemodynamically significant stenosis, abnormal luminal irregularity, aneurysm within the anterior nor posterior circulation.  IMPRESSION: MRI head: No acute intracranial process, specifically no evidence of acute ischemia.  Moderate white matter changes may reflect chronic small vessel ischemic disease, within normal range for patient's age though, advanced from prior examination.  MRA head:  Normal examination with complete circle of Willis.   Electronically Signed   By: Awilda Metro   On: 05/31/2013 04:49   Mr Cervical Spine Wo Contrast  05/31/2013   CLINICAL DATA:  Left-sided neck pain with numbness and tingling in hand.  EXAM: MRI CERVICAL SPINE WITHOUT CONTRAST  TECHNIQUE: Multiplanar, multisequence MR imaging was performed. No intravenous contrast was administered.  COMPARISON:  CT HEAD W/O CM dated 05/29/2013; CT C SPINE W/O CM dated 01/09/2008  FINDINGS: Cervical vertebral bodies and posterior elements appear intact. Straightened cervical lordosis. Stable grade 1 C3-4 anterolisthesis on degenerative basis. Moderate to severe C4-5 through C6-7 T1 disc height loss, with decreased T2 signal within all cervical disc most consistent with moderate desiccation. Moderate to severe chronic discogenic endplate changes at C5-6 thru C6-7, moderate C4-5. No abnormal bone marrow signal to suggest acute osseous process though, the STIR sequences moderately motion degraded.  Cervical spinal cord appears normal in morphology and signal characteristics from the cervicomedullary junction to the level of T3-4, the most caudal well visualized level. Craniocervical junction  appears intact. Included prevertebral and paraspinal soft tissues are nonsuspicious.  Level by level evaluation:  C2-3: No disc bulge. Moderate right, mild left facet arthropathy without canal stenosis or neural foraminal narrowing.  C3-4: Anterolisthesis. Uncovertebral hypertrophy, severe right and moderate left facet arthropathy without canal stenosis. Moderate right and mild-to-moderate left neural foraminal narrowing.  C4-5: Minimal annular bulging, uncovertebral hypertrophy and moderate to severe facet arthropathy. Mild canal stenosis. Severe bilateral neural foraminal narrowing.  C5-6: 3 mm broad-based disc bulge, asymmetric to left. Uncovertebral hypertrophy and mild facet arthropathy. Mild canal stenosis. Moderate to severe right, severe left neural foraminal narrowing.  C6-7: Annular bulging with uncovertebral hypertrophy. No canal stenosis. Moderate right and moderate to severe left neural foraminal narrowing.  C7-T1: 2 mm broad-based disc bulge asymmetric to left. Uncovertebral hypertrophy. No canal stenosis. Moderate left neural foraminal narrowing.  IMPRESSION: Straightened cervical lordosis with grade 1 C3-4 anterolisthesis on degenerative basis.  Mild canal stenosis C4-5 and C5-6.  Neural foraminal narrowing C3-4 thru C7-T1: Severe bilaterally at C4-5 and severe on the left at C5-6.   Electronically Signed   By: Awilda Metro   On: 05/31/2013 06:15   Mr Maxine Glenn Head/brain Wo Cm  05/31/2013   CLINICAL DATA:  Left-sided headache and numbness, altered speech.  EXAM: MRI HEAD WITHOUT CONTRAST  MRA HEAD WITHOUT CONTRAST  TECHNIQUE: Multiplanar, multiecho pulse sequences of the brain and surrounding structures were obtained without intravenous contrast. Angiographic images of the head were obtained using MRA technique without contrast.  COMPARISON:  CT HEAD W/O CM dated 05/29/2013; MR MRA HEAD W/O CM dated 01/11/2008  FINDINGS: MRI HEAD FINDINGS  No reduced diffusion to suggest acute ischemia. No  susceptibility artifact to suggest hemorrhage.  The ventricles and sulci are normal for patient's age. Scattered subcentimeter supratentorial white matter T2 hyperintensities are seen, no midline shift or mass effect.  No abnormal extra-axial fluid collections. Visualized paranasal sinuses and mastoid air cells are well aerated. Severe right temporomandibular osteoarthrosis. Ocular globes and orbital contents are nonsuspicious though not tailored for evaluation. No abnormal sellar expansion. No cerebellar tonsillar ectopia. Somewhat bright T1 bone marrow signal may reflect osteopenia.  MRA HEAD FINDINGS  Anterior circulation: Normal flow related enhancement of the included cervical, petrous, cavernous and supra clinoid internal carotid arteries. Patent anterior communicating artery. Normal flow related enhancement of the anterior and middle cerebral arteries, including more distal segments.  Posterior circulation: Right vertebral artery is dominant. Basilar artery is patent, with normal flow related enhancement of the main branch vessels. Normal flow related enhancement of the posterior cerebral arteries. Robust right and smaller left patent posterior communicating artery's.  No large vessel occlusion, hemodynamically significant stenosis, abnormal luminal irregularity, aneurysm within the anterior nor posterior circulation.  IMPRESSION: MRI head: No acute intracranial process, specifically no evidence of acute ischemia.  Moderate white matter changes may reflect chronic small vessel ischemic disease, within normal range for patient's age though, advanced from prior examination.  MRA head:  Normal examination with complete circle of Willis.   Electronically Signed   By: Awilda Metro   On: 05/31/2013 04:49    Microbiology: No results found for this or any previous visit (from the past 240 hour(s)).   Labs: Basic Metabolic Panel:  Recent Labs Lab 05/29/13 1729 05/30/13 0629  NA 140 144  K 4.0 4.4   CL 100 107  CO2 28 28  GLUCOSE 101* 95  BUN 17 16  CREATININE 0.83 0.79  CALCIUM 9.4 9.1   Liver Function Tests:  Recent Labs Lab 05/29/13 1729  AST 13  ALT 9  ALKPHOS 72  BILITOT 0.2*  PROT 6.6  ALBUMIN 3.8   No results found for this basename: LIPASE, AMYLASE,  in the last 168 hours No results found for this basename: AMMONIA,  in the last 168 hours CBC:  Recent Labs Lab 05/29/13 1729 05/30/13 0629  WBC 5.9 4.9  NEUTROABS 4.2  --   HGB 14.0 13.6  HCT 40.4 40.6  MCV 91.4 91.2  PLT 179 175   Cardiac Enzymes:  Recent Labs Lab 05/29/13 1752  TROPONINI <0.30   BNP: BNP (last 3 results) No results found for this basename: PROBNP,  in the last 8760 hours CBG:  Recent Labs Lab 05/29/13 2145 05/30/13 1133 05/30/13 1629 05/30/13 2209 05/31/13 0804  GLUCAP 86 95 166* 102* 106*       Signed:  Thersa Mohiuddin  [facility] 05/31/2013, 10:08 AM

## 2013-07-18 ENCOUNTER — Encounter (INDEPENDENT_AMBULATORY_CARE_PROVIDER_SITE_OTHER): Payer: Self-pay

## 2013-07-18 ENCOUNTER — Ambulatory Visit (INDEPENDENT_AMBULATORY_CARE_PROVIDER_SITE_OTHER): Payer: Medicare Other | Admitting: Neurology

## 2013-07-18 ENCOUNTER — Encounter: Payer: Self-pay | Admitting: Neurology

## 2013-07-18 VITALS — Ht 65.5 in | Wt 180.0 lb

## 2013-07-18 DIAGNOSIS — M797 Fibromyalgia: Secondary | ICD-10-CM

## 2013-07-18 DIAGNOSIS — R51 Headache: Secondary | ICD-10-CM

## 2013-07-18 DIAGNOSIS — R209 Unspecified disturbances of skin sensation: Secondary | ICD-10-CM

## 2013-07-18 DIAGNOSIS — M47812 Spondylosis without myelopathy or radiculopathy, cervical region: Secondary | ICD-10-CM | POA: Insufficient documentation

## 2013-07-18 DIAGNOSIS — R519 Headache, unspecified: Secondary | ICD-10-CM

## 2013-07-18 DIAGNOSIS — R200 Anesthesia of skin: Secondary | ICD-10-CM

## 2013-07-18 DIAGNOSIS — IMO0001 Reserved for inherently not codable concepts without codable children: Secondary | ICD-10-CM

## 2013-07-18 HISTORY — DX: Spondylosis without myelopathy or radiculopathy, cervical region: M47.812

## 2013-07-18 MED ORDER — PREGABALIN 25 MG PO CAPS: ORAL_CAPSULE | ORAL | Status: DC

## 2013-07-18 NOTE — Progress Notes (Signed)
Reason for visit: Possible TIA  EMILLIE CHASEN is a 73 y.o. female  History of present illness:  Ms. Schnepp is a 73 year old right-handed white female with a history of migraine headaches. In the past, she has had episodes of left sided headache, left facial discomfort and numbness and occasional episodes of left sided burning pain and numbness with the headache. Often times in the past, these events will culminate with a syncopal event. The last such event was in 2003. The patient went into the hospital around 05/29/2013 with onset of her left-sided headache, and left-sided symptoms of burning dysesthesias and numbness. A CT scan of the brain showed chronic small vessel changes, no acute changes were seen. MRI of the brain was done as part of a stroke workup. This showed no acute stroke, MRA of the head was unremarkable. Moderate small vessel ischemic changes were seen. The patient underwent a carotid Doppler study that was unremarkable, and a 2-D echocardiogram revealed an ejection fraction of 55-60%, otherwise unremarkable. She indicates that she has chronic problems dating back at least 15 years with neck discomfort mainly into the left side of the neck, left shoulder, and some pain down the left arm. MRI evaluation of the cervical spine was done with the recent hospitalization showing evidence of neuroforaminal stenosis bilaterally at the C4-5 level and to the left at the C5-6 level with a severe level of stenosis. The patient indicates that she will be weak on the left side with the headache events described above, also associated with blurring of vision and slurred speech. She indicates that the most recent episode lasted about 48 hours. She does report some low back pain. She has a history of fibromyalgia, and in the past she has been tried on gabapentin, and Cymbalta. She indicates that Cymbalta caused weight gain, and she is allergic to amitriptyline. She is sent to this office for further  evaluation.  Past Medical History  Diagnosis Date  . Fibromyalgia   . DJD (degenerative joint disease)   . Hiatal hernia   . Migraine headache   . Cervical spondylosis without myelopathy 07/18/2013    Past Surgical History  Procedure Laterality Date  . Tonsillectomy    . Hiatal hernia repair    . Abdominal hysterectomy    . Dilation and curettage of uterus      Family History  Problem Relation Age of Onset  . CVA Mother   . Stroke Father   . Emphysema Father     Social history:  reports that she has never smoked. She does not have any smokeless tobacco history on file. She reports that she drinks alcohol. She reports that she does not use illicit drugs.  Medications:  Current Outpatient Prescriptions on File Prior to Visit  Medication Sig Dispense Refill  . aspirin-acetaminophen-caffeine (EXCEDRIN MIGRAINE) 250-250-65 MG per tablet Take by mouth every 6 (six) hours as needed for headache.      . clopidogrel (PLAVIX) 75 MG tablet Take 1 tablet (75 mg total) by mouth daily with breakfast.  30 tablet  1  . ibuprofen (ADVIL,MOTRIN) 200 MG tablet Take 200 mg by mouth every 6 (six) hours as needed for mild pain.        No current facility-administered medications on file prior to visit.      Allergies  Allergen Reactions  . Aspirin Anaphylaxis and Hives  . Elavil [Amitriptyline] Anaphylaxis  . Relafen [Nabumetone] Hives    ROS:  Out of a complete 14  system review of symptoms, the patient complains only of the following symptoms, and all other reviewed systems are negative.  Fatigue Cough Easy bruising Joint pain, joint swelling, back pain Allergies, runny nose Not enough sleep, decreased energy, sleepiness Restless legs  Height 5' 5.5" (1.664 m), weight 180 lb (81.647 kg).  Physical Exam  General: The patient is alert and cooperative at the time of the examination.  Eyes: Pupils are equal, round, and reactive to light. Discs are flat bilaterally.  Neck: The  neck is supple, no carotid bruits are noted.  Respiratory: The respiratory examination is clear.  Cardiovascular: The cardiovascular examination reveals a regular rate and rhythm, no obvious murmurs or rubs are noted.  Neuromuscular: Range of movement of the cervical spine lacks only 10 of full lateral rotation bilaterally.   Skin: Extremities are without significant edema.  Neurologic Exam  Mental status: The patient is alert and oriented x 3 at the time of the examination. The patient has apparent normal recent and remote memory, with an apparently normal attention span and concentration ability.  Cranial nerves: Facial symmetry is present. There is good sensation of the face to pinprick and soft touch bilaterally. The strength of the facial muscles and the muscles to head turning and shoulder shrug are normal bilaterally. Speech is well enunciated, no aphasia or dysarthria is noted. Extraocular movements are full. Visual fields are full. The tongue is midline, and the patient has symmetric elevation of the soft palate. No obvious hearing deficits are noted.  Motor: The motor testing reveals 5 over 5 strength of all 4 extremities. Good symmetric motor tone is noted throughout.  Sensory: Sensory testing is intact to pinprick, soft touch, vibration sensation, and position sense on all 4 extremities. No evidence of extinction is noted.  Coordination: Cerebellar testing reveals good finger-nose-finger and heel-to-shin bilaterally.  Gait and station: Gait is normal. Tandem gait is slightly unsteady. Romberg is negative. No drift is seen.  Reflexes: Deep tendon reflexes are symmetric and normal bilaterally. Toes are downgoing bilaterally.   MRI brain and MRA head 05/31/13:  IMPRESSION:  MRI head: No acute intracranial process, specifically no evidence of  acute ischemia.  Moderate white matter changes may reflect chronic small vessel  ischemic disease, within normal range for patient's  age though,  advanced from prior examination.  MRA head: Normal examination with complete circle of Jamail Cullers.     MRI cervical spine 05/31/13:  IMPRESSION:  Straightened cervical lordosis with grade 1 C3-4 anterolisthesis on  degenerative basis.  Mild canal stenosis C4-5 and C5-6.  Neural foraminal narrowing C3-4 thru C7-T1: Severe bilaterally at  C4-5 and severe on the left at C5-6.     Assessment/Plan:  One. History of migraine headache  2. History of syncope  3. Episodic left-sided sensory alteration  4. Cervical spondylosis  The patient reports a history of headaches off and on occurring on average once a week. The patient indicates that occasionally she will have left-sided sensory changes and pain associated with the headache. The most recent event necessitated a hospitalization, and the stroke workup was unremarkable. She was switched to Plavix therapy. The patient reports chronic neck and shoulder discomfort likely related to the cervical spondylosis. She will remain on Plavix at this point, and we will get her set up for physical therapy for neuromuscular therapy. She will be placed on low-dose Lyrica. She will followup through this office in 4 months. In the future, epidural steroid injections in the cervical spine may be helpful.  Epidural steroid injections in the low back previously did not help her. She indicates that the episodes of syncope in the past were felt related to a very large hiatal hernia that has been surgically repaired.  Marlan Palau. Keith Ellamae Lybeck MD 07/18/2013 7:12 PM  [facility] [address] [place] [address]  Phone [phone] Fax [phone]

## 2013-07-18 NOTE — Patient Instructions (Signed)

## 2013-08-03 ENCOUNTER — Ambulatory Visit: Payer: Medicare Other | Attending: Neurology | Admitting: Physical Therapy

## 2013-08-03 DIAGNOSIS — IMO0001 Reserved for inherently not codable concepts without codable children: Secondary | ICD-10-CM | POA: Insufficient documentation

## 2013-08-03 DIAGNOSIS — M542 Cervicalgia: Secondary | ICD-10-CM | POA: Diagnosis not present

## 2013-08-03 DIAGNOSIS — R293 Abnormal posture: Secondary | ICD-10-CM | POA: Diagnosis not present

## 2013-08-03 DIAGNOSIS — M25519 Pain in unspecified shoulder: Secondary | ICD-10-CM | POA: Insufficient documentation

## 2013-08-03 DIAGNOSIS — M6281 Muscle weakness (generalized): Secondary | ICD-10-CM | POA: Diagnosis not present

## 2013-08-03 DIAGNOSIS — M47812 Spondylosis without myelopathy or radiculopathy, cervical region: Secondary | ICD-10-CM | POA: Insufficient documentation

## 2013-08-09 ENCOUNTER — Ambulatory Visit: Payer: Medicare Other | Attending: Neurology | Admitting: Physical Therapy

## 2013-08-09 DIAGNOSIS — M25519 Pain in unspecified shoulder: Secondary | ICD-10-CM | POA: Diagnosis not present

## 2013-08-09 DIAGNOSIS — M6281 Muscle weakness (generalized): Secondary | ICD-10-CM | POA: Insufficient documentation

## 2013-08-09 DIAGNOSIS — IMO0001 Reserved for inherently not codable concepts without codable children: Secondary | ICD-10-CM | POA: Insufficient documentation

## 2013-08-09 DIAGNOSIS — R293 Abnormal posture: Secondary | ICD-10-CM | POA: Diagnosis not present

## 2013-08-09 DIAGNOSIS — M542 Cervicalgia: Secondary | ICD-10-CM | POA: Insufficient documentation

## 2013-08-09 DIAGNOSIS — M47812 Spondylosis without myelopathy or radiculopathy, cervical region: Secondary | ICD-10-CM | POA: Diagnosis not present

## 2013-08-12 ENCOUNTER — Ambulatory Visit: Payer: Medicare Other | Admitting: Physical Therapy

## 2013-08-12 DIAGNOSIS — IMO0001 Reserved for inherently not codable concepts without codable children: Secondary | ICD-10-CM | POA: Diagnosis not present

## 2013-08-17 ENCOUNTER — Ambulatory Visit: Payer: Medicare Other | Admitting: Physical Therapy

## 2013-08-17 DIAGNOSIS — IMO0001 Reserved for inherently not codable concepts without codable children: Secondary | ICD-10-CM | POA: Diagnosis not present

## 2013-08-19 ENCOUNTER — Ambulatory Visit: Payer: Medicare Other | Admitting: Physical Therapy

## 2013-08-19 DIAGNOSIS — IMO0001 Reserved for inherently not codable concepts without codable children: Secondary | ICD-10-CM | POA: Diagnosis not present

## 2013-08-24 ENCOUNTER — Ambulatory Visit: Payer: Medicare Other | Admitting: Physical Therapy

## 2013-08-24 DIAGNOSIS — IMO0001 Reserved for inherently not codable concepts without codable children: Secondary | ICD-10-CM | POA: Diagnosis not present

## 2013-08-26 ENCOUNTER — Ambulatory Visit: Payer: Medicare Other | Admitting: Physical Therapy

## 2013-08-26 DIAGNOSIS — IMO0001 Reserved for inherently not codable concepts without codable children: Secondary | ICD-10-CM | POA: Diagnosis not present

## 2013-08-30 ENCOUNTER — Ambulatory Visit: Payer: Medicare Other | Admitting: Physical Therapy

## 2013-08-30 DIAGNOSIS — IMO0001 Reserved for inherently not codable concepts without codable children: Secondary | ICD-10-CM | POA: Diagnosis not present

## 2013-09-02 ENCOUNTER — Ambulatory Visit: Payer: Medicare Other | Admitting: Physical Therapy

## 2013-09-02 DIAGNOSIS — IMO0001 Reserved for inherently not codable concepts without codable children: Secondary | ICD-10-CM | POA: Diagnosis not present

## 2013-09-12 ENCOUNTER — Ambulatory Visit: Payer: Medicare Other | Attending: Neurology | Admitting: Physical Therapy

## 2013-09-12 DIAGNOSIS — M47812 Spondylosis without myelopathy or radiculopathy, cervical region: Secondary | ICD-10-CM | POA: Insufficient documentation

## 2013-09-12 DIAGNOSIS — M25519 Pain in unspecified shoulder: Secondary | ICD-10-CM | POA: Insufficient documentation

## 2013-09-12 DIAGNOSIS — M6281 Muscle weakness (generalized): Secondary | ICD-10-CM | POA: Diagnosis not present

## 2013-09-12 DIAGNOSIS — IMO0001 Reserved for inherently not codable concepts without codable children: Secondary | ICD-10-CM | POA: Diagnosis present

## 2013-09-12 DIAGNOSIS — R293 Abnormal posture: Secondary | ICD-10-CM | POA: Insufficient documentation

## 2013-09-12 DIAGNOSIS — M542 Cervicalgia: Secondary | ICD-10-CM | POA: Insufficient documentation

## 2013-09-14 ENCOUNTER — Ambulatory Visit: Payer: Medicare Other | Admitting: Physical Therapy

## 2013-09-20 ENCOUNTER — Ambulatory Visit: Payer: Medicare Other | Admitting: Physical Therapy

## 2013-09-22 ENCOUNTER — Ambulatory Visit: Payer: Medicare Other | Admitting: Physical Therapy

## 2013-09-26 ENCOUNTER — Ambulatory Visit: Payer: Medicare Other | Admitting: Physical Therapy

## 2013-09-28 ENCOUNTER — Ambulatory Visit: Payer: Medicare Other | Admitting: Physical Therapy

## 2013-11-17 ENCOUNTER — Ambulatory Visit (INDEPENDENT_AMBULATORY_CARE_PROVIDER_SITE_OTHER): Payer: Medicare Other | Admitting: Adult Health

## 2013-11-17 ENCOUNTER — Encounter: Payer: Self-pay | Admitting: Adult Health

## 2013-11-17 VITALS — BP 106/71 | HR 77 | Ht 65.5 in | Wt 184.0 lb

## 2013-11-17 DIAGNOSIS — M797 Fibromyalgia: Secondary | ICD-10-CM

## 2013-11-17 DIAGNOSIS — IMO0001 Reserved for inherently not codable concepts without codable children: Secondary | ICD-10-CM

## 2013-11-17 DIAGNOSIS — M47812 Spondylosis without myelopathy or radiculopathy, cervical region: Secondary | ICD-10-CM

## 2013-11-17 DIAGNOSIS — R209 Unspecified disturbances of skin sensation: Secondary | ICD-10-CM

## 2013-11-17 DIAGNOSIS — R51 Headache: Secondary | ICD-10-CM

## 2013-11-17 MED ORDER — PREGABALIN 50 MG PO CAPS: 50.0000 mg | ORAL_CAPSULE | Freq: Two times a day (BID) | ORAL | Status: DC

## 2013-11-17 NOTE — Patient Instructions (Signed)
Pregabalin capsules What is this medicine? PREGABALIN (pre GAB a lin) is used to treat nerve pain from diabetes, shingles, spinal cord injury, and fibromyalgia. It is also used to control seizures in epilepsy. This medicine may be used for other purposes; ask your health care provider or pharmacist if you have questions. COMMON BRAND NAME(S): Lyrica What should I tell my health care provider before I take this medicine? They need to know if you have any of these conditions: -bleeding problems -heart disease, including heart failure -history of alcohol or drug abuse -kidney disease -suicidal thoughts, plans, or attempt; a previous suicide attempt by you or a family member -an unusual or allergic reaction to pregabalin, gabapentin, other medicines, foods, dyes, or preservatives -pregnant or trying to get pregnant or trying to conceive with your partner -breast-feeding How should I use this medicine? Take this medicine by mouth with a glass of water. Follow the directions on the prescription label. You can take this medicine with or without food. Take your doses at regular intervals. Do not take your medicine more often than directed. Do not stop taking except on your doctor's advice. A special MedGuide will be given to you by the pharmacist with each prescription and refill. Be sure to read this information carefully each time. Talk to your pediatrician regarding the use of this medicine in children. Special care may be needed. Overdosage: If you think you have taken too much of this medicine contact a poison control center or emergency room at once. NOTE: This medicine is only for you. Do not share this medicine with others. What if I miss a dose? If you miss a dose, take it as soon as you can. If it is almost time for your next dose, take only that dose. Do not take double or extra doses. What may interact with this medicine? -alcohol -certain medicines for blood pressure like captopril,  enalapril, or lisinopril -certain medicines for diabetes, like pioglitazone or rosiglitazone -certain medicines for anxiety or sleep -narcotic medicines for pain This list may not describe all possible interactions. Give your health care provider a list of all the medicines, herbs, non-prescription drugs, or dietary supplements you use. Also tell them if you smoke, drink alcohol, or use illegal drugs. Some items may interact with your medicine. What should I watch for while using this medicine? Tell your doctor or healthcare professional if your symptoms do not start to get better or if they get worse. Visit your doctor or health care professional for regular checks on your progress. Do not stop taking except on your doctor's advice. You may develop a severe reaction. Your doctor will tell you how much medicine to take. Wear a medical identification bracelet or chain if you are taking this medicine for seizures, and carry a card that describes your disease and details of your medicine and dosage times. You may get drowsy or dizzy. Do not drive, use machinery, or do anything that needs mental alertness until you know how this medicine affects you. Do not stand or sit up quickly, especially if you are an older patient. This reduces the risk of dizzy or fainting spells. Alcohol may interfere with the effect of this medicine. Avoid alcoholic drinks. If you have a heart condition, like congestive heart failure, and notice that you are retaining water and have swelling in your hands or feet, contact your health care provider immediately. The use of this medicine may increase the chance of suicidal thoughts or actions. Pay special attention   to how you are responding while on this medicine. Any worsening of mood, or thoughts of suicide or dying should be reported to your health care professional right away. This medicine has caused reduced sperm counts in some men. This may interfere with the ability to father a  child. You should talk to your doctor or health care professional if you are concerned about your fertility. Women who become pregnant while using this medicine for seizures may enroll in the [place] Antiepileptic Drug Pregnancy Registry by calling 1-888-233-2334. This registry collects information about the safety of antiepileptic drug use during pregnancy. What side effects may I notice from receiving this medicine? Side effects that you should report to your doctor or health care professional as soon as possible: -allergic reactions like skin rash, itching or hives, swelling of the face, lips, or tongue -breathing problems -changes in vision -chest pain -confusion -jerking or unusual movements of any part of your body -loss of memory -muscle pain, tenderness, or weakness -suicidal thoughts or other mood changes -swelling of the ankles, feet, hands -unusual bruising or bleeding Side effects that usually do not require medical attention (Report these to your doctor or health care professional if they continue or are bothersome.): -dizziness -drowsiness -dry mouth -headache -nausea -tremors -trouble sleeping -weight gain This list may not describe all possible side effects. Call your doctor for medical advice about side effects. You may report side effects to FDA at 1-800-FDA-1088. Where should I keep my medicine? Keep out of the reach of children. This medicine can be abused. Keep your medicine in a safe place to protect it from theft. Do not share this medicine with anyone. Selling or giving away this medicine is dangerous and against the law. Store at room temperature between 15 and 30 degrees C (59 and 86 degrees F). Throw away any unused medicine after the expiration date. NOTE: This sheet is a summary. It may not cover all possible information. If you have questions about this medicine, talk to your doctor, pharmacist, or health care provider.  2015, Elsevier/Gold Standard.  (2010-08-29 20:00:36)  

## 2013-11-17 NOTE — Progress Notes (Signed)
I have read the note, and I agree with the clinical assessment and plan.  WILLIS,CHARLES KEITH   

## 2013-11-17 NOTE — Progress Notes (Signed)
PATIENT: [name] DOB: Mar 26, 1940  REASON FOR VISIT: follow up HISTORY FROM: patient  HISTORY OF PRESENT ILLNESS: [name] is a 73 year old female with a history of migraine headaches and fibromyalgia. She returns today for follow-up. She is currently taking lyrica 50 mg BID and reports that it is helping. She states the discomfort that she was experiencing in her legs on the left side has improved. She will occasionally have some numbness but not like it was. She states that her headaches have been more frequent due to her allergies. She states that she can take OTC medication and it usually helps. She states that she has had a sore throat with her allergies. She was sent for neuromuscular therapy and reports that was very helpful and she continues to do the exercises that they taught her.   HISTORY 11/17/13 (CW): 73 year old right-handed white female with a history of migraine headaches. In the past, she has had episodes of left sided headache, left facial discomfort and numbness and occasional episodes of left sided burning pain and numbness with the headache. Often times in the past, these events will culminate with a syncopal event. The last such event was in 2003. The patient went into the hospital around 05/29/2013 with onset of her left-sided headache, and left-sided symptoms of burning dysesthesias and numbness. A CT scan of the brain showed chronic small vessel changes, no acute changes were seen. MRI of the brain was done as part of a stroke workup. This showed no acute stroke, MRA of the head was unremarkable. Moderate small vessel ischemic changes were seen. The patient underwent a carotid Doppler study that was unremarkable, and a 2-D echocardiogram revealed an ejection fraction of 55-60%, otherwise unremarkable. She indicates that she has chronic problems dating back at least 15 years with neck discomfort mainly into the left side of the neck, left shoulder, and some pain down  the left arm. MRI evaluation of the cervical spine was done with the recent hospitalization showing evidence of neuroforaminal stenosis bilaterally at the C4-5 level and to the left at the C5-6 level with a severe level of stenosis. The patient indicates that she will be weak on the left side with the headache events described above, also associated with blurring of vision and slurred speech. She indicates that the most recent episode lasted about 48 hours. She does report some low back pain. She has a history of fibromyalgia, and in the past she has been tried on gabapentin, and Cymbalta. She indicates that Cymbalta caused weight gain, and she is allergic to amitriptyline. She is sent to this office for further evaluation.   REVIEW OF SYSTEMS: Full 14 system review of systems performed and notable only for:  Constitutional: N/A Eyes: Eye itching, eye redness Ear/Nose/Throat: Ringing in the ears, runny nose, trouble swallowing Skin: N/A  Cardiovascular: Leg swelling  Respiratory: Cough Gastrointestinal: N/A  Genitourinary: Frequency of urination Hematology/Lymphatic: N/A  Endocrine: N/A Musculoskeletal: Joint swelling Allergy/Immunology: N/A  Neurological: Headache, weakness sometimes Psychiatric: N/A Sleep: Snoring   ALLERGIES: Allergies  Allergen Reactions  . Aspirin Anaphylaxis and Hives  . Elavil [Amitriptyline] Anaphylaxis  . Relafen [Nabumetone] Hives    HOME MEDICATIONS: Outpatient Prescriptions Prior to Visit  Medication Sig Dispense Refill  . aspirin-acetaminophen-caffeine (EXCEDRIN MIGRAINE) 250-250-65 MG per tablet Take by mouth every 6 (six) hours as needed for headache.      . Calcium Carbonate-Vit D-Min (CALCIUM 1200 PO) Take 1 capsule by mouth daily.      Marland Kitchen  Cholecalciferol (VITAMIN D-3) 1000 UNITS CAPS Take 2 capsules by mouth daily.      . clopidogrel (PLAVIX) 75 MG tablet Take 1 tablet (75 mg total) by mouth daily with breakfast.  30 tablet  1  . ibuprofen  (ADVIL,MOTRIN) 200 MG tablet Take 200 mg by mouth every 6 (six) hours as needed for mild pain.       . Multiple Vitamins-Minerals (HAIR/SKIN/NAILS) TABS Take 1 tablet by mouth daily.      . Omega-3 Fatty Acids (FISH OIL) 1000 MG CAPS Take 1,000 capsules by mouth daily.      . pregabalin (LYRICA) 25 MG capsule One capsule twice a day for 2 weeks, then take 2 capsules twice a day  120 capsule  3   No facility-administered medications prior to visit.    PAST MEDICAL HISTORY: Past Medical History  Diagnosis Date  . Fibromyalgia   . DJD (degenerative joint disease)   . Hiatal hernia   . Migraine headache   . Cervical spondylosis without myelopathy 07/18/2013    PAST SURGICAL HISTORY: Past Surgical History  Procedure Laterality Date  . Tonsillectomy    . Hiatal hernia repair    . Abdominal hysterectomy    . Dilation and curettage of uterus      FAMILY HISTORY: Family History  Problem Relation Age of Onset  . CVA Mother   . Stroke Father   . Emphysema Father     SOCIAL HISTORY: History   Social History  . Marital Status: Married    Spouse Name: N/A    Number of Children: 5  . Years of Education: college   Occupational History  . Retired    Social History Main Topics  . Smoking status: Never Smoker   . Smokeless tobacco: Not on file  . Alcohol Use: Yes     Comment: very occasional  . Drug Use: No  . Sexual Activity: No   Other Topics Concern  . Not on file   Social History Narrative  . No narrative on file      PHYSICAL EXAM  Filed Vitals:   11/17/13 1007  BP: 106/71  Pulse: 77  Height: 5' 5.5" (1.664 m)  Weight: 184 lb (83.462 kg)   Body mass index is 30.14 kg/(m^2).  Generalized: Well developed, in no acute distress   Neurological examination  Mentation: Alert oriented to time, place, history taking. Follows all commands speech and language fluent Cranial nerve II-XII: Pupils were equal round reactive to light. Extraocular movements were full,  visual field were full on confrontational test. Facial sensation and strength were normal.Uvula tongue midline. Head turning and shoulder shrug  were normal and symmetric. Motor: The motor testing reveals 5 over 5 strength of all 4 extremities. Good symmetric motor tone is noted throughout.  Sensory: Sensory testing is intact to soft touch on all 4 extremities. No evidence of extinction is noted.  Coordination: Cerebellar testing reveals good finger-nose-finger and heel-to-shin bilaterally.  Gait and station: Gait is normal. Tandem gait is slightly unsteady. Romberg is negative. No drift is seen.  Reflexes: Deep tendon reflexes are symmetric and normal bilaterally.   DIAGNOSTIC DATA (LABS, IMAGING, TESTING) - I reviewed patient records, labs, notes, testing and imaging myself where available.  Lab Results  Component Value Date   WBC 4.9 05/30/2013   HGB 13.6 05/30/2013   HCT 40.6 05/30/2013   MCV 91.2 05/30/2013   PLT 175 05/30/2013      Component Value Date/Time   NA 144  05/30/2013 0629   K 4.4 05/30/2013 0629   CL 107 05/30/2013 0629   CO2 28 05/30/2013 0629   GLUCOSE 95 05/30/2013 0629   BUN 16 05/30/2013 0629   CREATININE 0.79 05/30/2013 0629   CALCIUM 9.1 05/30/2013 0629   PROT 6.6 05/29/2013 1729   ALBUMIN 3.8 05/29/2013 1729   AST 13 05/29/2013 1729   ALT 9 05/29/2013 1729   ALKPHOS 72 05/29/2013 1729   BILITOT 0.2* 05/29/2013 1729   GFRNONAA 82* 05/30/2013 0629   GFRAA >90 05/30/2013 0629   Lab Results  Component Value Date   CHOL 138 05/30/2013   HDL 53 05/30/2013   LDLCALC 68 05/30/2013   TRIG 87 05/30/2013   CHOLHDL 2.6 05/30/2013   Lab Results  Component Value Date   HGBA1C 6.0* 05/29/2013   Lab Results  Component Value Date   VITAMINB12 407 01/10/2008       ASSESSMENT AND PLAN 73 y.o. year old female  has a past medical history of Fibromyalgia; DJD (degenerative joint disease); Hiatal hernia; Migraine headache; and Cervical spondylosis without myelopathy (07/18/2013). here  with:  1. headache 2. Left sided sensory changes 3. Cervical spondylosis  Continue lyrica. I will refill today.  Continue doing the exercises for your neck.  Follow-up in 6 months or sooner if needed.    Butch Penny, MSN, NP-C 11/17/2013, 10:06 AM [facility] [address], [address] [place], [place] [address] [phone]  Note: This document was prepared with digital dictation and possible smart phrase technology. Any transcriptional errors that result from this process are unintentional.

## 2013-11-18 ENCOUNTER — Ambulatory Visit: Payer: Medicare Other | Admitting: Adult Health

## 2014-01-25 ENCOUNTER — Encounter: Payer: Self-pay | Admitting: Neurology

## 2014-01-31 ENCOUNTER — Encounter: Payer: Self-pay | Admitting: Neurology

## 2014-03-28 DIAGNOSIS — Z79899 Other long term (current) drug therapy: Secondary | ICD-10-CM | POA: Diagnosis not present

## 2014-03-28 DIAGNOSIS — J309 Allergic rhinitis, unspecified: Secondary | ICD-10-CM | POA: Diagnosis not present

## 2014-03-28 DIAGNOSIS — M797 Fibromyalgia: Secondary | ICD-10-CM | POA: Diagnosis not present

## 2014-03-28 DIAGNOSIS — Z5181 Encounter for therapeutic drug level monitoring: Secondary | ICD-10-CM | POA: Diagnosis not present

## 2014-04-29 ENCOUNTER — Encounter (HOSPITAL_COMMUNITY): Payer: Self-pay | Admitting: Emergency Medicine

## 2014-04-29 ENCOUNTER — Emergency Department (INDEPENDENT_AMBULATORY_CARE_PROVIDER_SITE_OTHER)
Admission: EM | Admit: 2014-04-29 | Discharge: 2014-04-29 | Disposition: A | Discharge status: Home / Self Care | Payer: Medicare Other | Source: Home / Self Care | Attending: Emergency Medicine | Admitting: Emergency Medicine

## 2014-04-29 DIAGNOSIS — M79604 Pain in right leg: Secondary | ICD-10-CM | POA: Diagnosis not present

## 2014-04-29 MED ORDER — TRAMADOL HCL 50 MG PO TABS: 50.0000 mg | ORAL_TABLET | Freq: Four times a day (QID) | ORAL | Status: DC | PRN

## 2014-04-29 NOTE — ED Provider Notes (Signed)
CSN: [id number]     Arrival date & time 04/29/14  1117 History   First MD Initiated Contact with Patient 04/29/14 1142     Chief Complaint  Patient presents with  . Leg Swelling   (Consider location/radiation/quality/duration/timing/severity/associated sxs/prior Treatment) HPI     74 year old female with history of fibromyalgia, TIA, or family history of rheumatoid arthritis, presents for evaluation of right leg pain and swelling. This started about 2 days ago. She has subjective pain and swelling in the right foot and ankle. She occasionally has pain in the knee also. She says this seems to happen intermittently. She also gets redness, pain, and swelling of her hands. This usually happens in both hands at the same time. She says that her mom had a similar condition. She denies any trauma, also denies any recent travel. No history of DVT or PE. She is currently on Plavix ever since her TIA. She has been taking Tylenol with some relief of her symptoms  Past Medical History  Diagnosis Date  . Fibromyalgia   . DJD (degenerative joint disease)   . Hiatal hernia   . Migraine headache   . Cervical spondylosis without myelopathy 07/18/2013   Past Surgical History  Procedure Laterality Date  . Tonsillectomy    . Hiatal hernia repair    . Abdominal hysterectomy    . Dilation and curettage of uterus     Family History  Problem Relation Age of Onset  . CVA Mother   . Stroke Father   . Emphysema Father    History  Substance Use Topics  . Smoking status: Never Smoker   . Smokeless tobacco: Never Used  . Alcohol Use: Yes     Comment: very occasional   OB History    No data available     Review of Systems  Constitutional: Negative for fever and chills.  Cardiovascular: Positive for leg swelling.  Musculoskeletal: Positive for myalgias, joint swelling and arthralgias.  All other systems reviewed and are negative.   Allergies  Aspirin; Elavil; Latex; and Relafen  Home Medications    Prior to Admission medications   Medication Sig Start Date End Date Taking? Authorizing Provider  clopidogrel (PLAVIX) 75 MG tablet Take 1 tablet (75 mg total) by mouth daily with breakfast. 05/31/13  Yes Jeralyn Bennett, MD  pregabalin (LYRICA) 50 MG capsule Take 1 capsule (50 mg total) by mouth 2 (two) times daily. 11/17/13  Yes Butch Penny, NP  aspirin-acetaminophen-caffeine (EXCEDRIN MIGRAINE) [phone] MG per tablet Take by mouth every 6 (six) hours as needed for headache.    Historical Provider, MD  Calcium Carbonate-Vit D-Min (CALCIUM 1200 PO) Take 1 capsule by mouth daily.    Historical Provider, MD  cetirizine (ZYRTEC) 10 MG tablet Take 10 mg by mouth daily.    Historical Provider, MD  Cholecalciferol (VITAMIN D-3) 1000 UNITS CAPS Take 2 capsules by mouth daily.    Historical Provider, MD  ibuprofen (ADVIL,MOTRIN) 200 MG tablet Take 200 mg by mouth every 6 (six) hours as needed for mild pain.     Historical Provider, MD  Multiple Vitamins-Minerals (HAIR/SKIN/NAILS) TABS Take 1 tablet by mouth daily.    Historical Provider, MD  Omega-3 Fatty Acids (FISH OIL) 1000 MG CAPS Take 1,000 capsules by mouth daily.    Historical Provider, MD  pseudoephedrine-acetaminophen (TYLENOL SINUS) 30-500 MG TABS Take 1 tablet by mouth every 4 (four) hours as needed.    Historical Provider, MD  traMADol (ULTRAM) 50 MG tablet Take 1 tablet (50  mg total) by mouth every 6 (six) hours as needed. 04/29/14   Adrian BlackwaterZachary H Suzanne Kho, PA-C   BP 126/55 mmHg  Pulse 65  Temp(Src) 98.1 F (36.7 C) (Oral)  Resp 20  SpO2 97% Physical Exam  Constitutional: She is oriented to person, place, and time. Vital signs are normal. She appears well-developed and well-nourished. No distress.  HENT:  Head: Normocephalic and atraumatic.  Cardiovascular: Normal rate, regular rhythm, normal heart sounds and intact distal pulses.   Pulmonary/Chest: Effort normal and breath sounds normal. No respiratory distress.  Musculoskeletal:        Right ankle: She exhibits normal range of motion, no swelling and no deformity. Tenderness.       Right foot: There is tenderness (mild, diffuse tenderness). There is normal range of motion, no swelling and no deformity.  She has no swelling of her right lower extremity  Neurological: She is alert and oriented to person, place, and time. She has normal strength. Coordination normal.  Skin: Skin is warm and dry. No rash noted. She is not diaphoretic.  Psychiatric: She has a normal mood and affect. Judgment normal.  Nursing note and vitals reviewed.   ED Course  Procedures (including critical care time) Labs Review Labs Reviewed - No data to display  Imaging Review No results found.   MDM   1. Pain of right lower extremity    No signs of DVT or cellulitis, she has no appreciable swelling. She most likely has some mild arthritis. With what she describes in her hands, she may also have an inflammatory arthritis such as RA. She will follow-up with her primary care provider for evaluation of this. Given her history of TIA and her age, we will avoid NSAIDs. She will continue Tylenol and I will give her prescription for tramadol to take when necessary.   Meds ordered this encounter  Medications  . traMADol (ULTRAM) 50 MG tablet    Sig: Take 1 tablet (50 mg total) by mouth every 6 (six) hours as needed.    Dispense:  30 tablet    Refill:  0      Graylon GoodZachary H Daveion Robar, PA-C 04/29/14 1225

## 2014-04-29 NOTE — Discharge Instructions (Signed)
I believe your symptoms are most consistent with rheumatoid arthritis. You may take the tramadol as needed for your pain, but you need to follow-up with your primary care doctor next week.   Pain of Unknown Etiology (Pain Without a Known Cause) You have come to your caregiver because of pain. Pain can occur in any part of the body. Often there is not a definite cause. If your laboratory (blood or urine) work was normal and X-rays or other studies were normal, your caregiver may treat you without knowing the cause of the pain. An example of this is the headache. Most headaches are diagnosed by taking a history. This means your caregiver asks you questions about your headaches. Your caregiver determines a treatment based on your answers. Usually testing done for headaches is normal. Often testing is not done unless there is no response to medications. Regardless of where your pain is located today, you can be given medications to make you comfortable. If no physical cause of pain can be found, most cases of pain will gradually leave as suddenly as they came.  If you have a painful condition and no reason can be found for the pain, it is important that you follow up with your caregiver. If the pain becomes worse or does not go away, it may be necessary to repeat tests and look further for a possible cause.  Only take over-the-counter or prescription medicines for pain, discomfort, or fever as directed by your caregiver.  For the protection of your privacy, test results cannot be given over the phone. Make sure you receive the results of your test. Ask how these results are to be obtained if you have not been informed. It is your responsibility to obtain your test results.  You may continue all activities unless the activities cause more pain. When the pain lessens, it is important to gradually resume normal activities. Resume activities by beginning slowly and gradually increasing the intensity and duration  of the activities or exercise. During periods of severe pain, bed rest may be helpful. Lie or sit in any position that is comfortable.  Ice used for acute (sudden) conditions may be effective. Use a large plastic bag filled with ice and wrapped in a towel. This may provide pain relief.  See your caregiver for continued problems. Your caregiver can help or refer you for exercises or physical therapy if necessary. If you were given medications for your condition, do not drive, operate machinery or power tools, or sign legal documents for 24 hours. Do not drink alcohol, take sleeping pills, or take other medications that may interfere with treatment. See your caregiver immediately if you have pain that is becoming worse and not relieved by medications. Document Released: 11/19/2000 Document Revised: 12/15/2012 Document Reviewed: 02/24/2005 [facility] Patient Information 2015 [employer] [place]. This information is not intended to replace advice given to you by your health care provider. Make sure you discuss any questions you have with your health care provider.  Musculoskeletal Pain Musculoskeletal pain is muscle and boney aches and pains. These pains can occur in any part of the body. Your caregiver may treat you without knowing the cause of the pain. They may treat you if blood or urine tests, X-rays, and other tests were normal.  CAUSES There is often not a definite cause or reason for these pains. These pains may be caused by a type of germ (virus). The discomfort may also come from overuse. Overuse includes working out too hard when your  body is not fit. Boney aches also come from weather changes. Bone is sensitive to atmospheric pressure changes. HOME CARE INSTRUCTIONS   Ask when your test results will be ready. Make sure you get your test results.  Only take over-the-counter or prescription medicines for pain, discomfort, or fever as directed by your caregiver. If you were given medications for  your condition, do not drive, operate machinery or power tools, or sign legal documents for 24 hours. Do not drink alcohol. Do not take sleeping pills or other medications that may interfere with treatment.  Continue all activities unless the activities cause more pain. When the pain lessens, slowly resume normal activities. Gradually increase the intensity and duration of the activities or exercise.  During periods of severe pain, bed rest may be helpful. Lay or sit in any position that is comfortable.  Putting ice on the injured area.  Put ice in a bag.  Place a towel between your skin and the bag.  Leave the ice on for 15 to 20 minutes, 3 to 4 times a day.  Follow up with your caregiver for continued problems and no reason can be found for the pain. If the pain becomes worse or does not go away, it may be necessary to repeat tests or do additional testing. Your caregiver may need to look further for a possible cause. SEEK IMMEDIATE MEDICAL CARE IF:  You have pain that is getting worse and is not relieved by medications.  You develop chest pain that is associated with shortness or breath, sweating, feeling sick to your stomach (nauseous), or throw up (vomit).  Your pain becomes localized to the abdomen.  You develop any new symptoms that seem different or that concern you. MAKE SURE YOU:   Understand these instructions.  Will watch your condition.  Will get help right away if you are not doing well or get worse. Document Released: 02/24/2005 Document Revised: 05/19/2011 Document Reviewed: 10/29/2012 [facility] Patient Information 2015 [place], [place]. This information is not intended to replace advice given to you by your health care provider. Make sure you discuss any questions you have with your health care provider.

## 2014-04-29 NOTE — ED Notes (Signed)
Pt states that she noticed her right leg has been swollen for 2 days. Pt states "this happens when the weather changes"

## 2014-05-08 DIAGNOSIS — M797 Fibromyalgia: Secondary | ICD-10-CM | POA: Diagnosis not present

## 2014-05-08 DIAGNOSIS — R609 Edema, unspecified: Secondary | ICD-10-CM | POA: Diagnosis not present

## 2014-05-08 DIAGNOSIS — E559 Vitamin D deficiency, unspecified: Secondary | ICD-10-CM | POA: Diagnosis not present

## 2014-05-08 DIAGNOSIS — Z79899 Other long term (current) drug therapy: Secondary | ICD-10-CM | POA: Diagnosis not present

## 2014-05-18 ENCOUNTER — Encounter: Payer: Self-pay | Admitting: Adult Health

## 2014-05-18 ENCOUNTER — Ambulatory Visit (INDEPENDENT_AMBULATORY_CARE_PROVIDER_SITE_OTHER): Payer: Medicare Other | Admitting: Adult Health

## 2014-05-18 VITALS — BP 98/64 | HR 75 | Ht 65.5 in | Wt 190.0 lb

## 2014-05-18 DIAGNOSIS — G43009 Migraine without aura, not intractable, without status migrainosus: Secondary | ICD-10-CM | POA: Diagnosis not present

## 2014-05-18 DIAGNOSIS — M797 Fibromyalgia: Secondary | ICD-10-CM | POA: Diagnosis not present

## 2014-05-18 MED ORDER — PREGABALIN 50 MG PO CAPS: 50.0000 mg | ORAL_CAPSULE | Freq: Two times a day (BID) | ORAL | Status: DC

## 2014-05-18 NOTE — Progress Notes (Signed)
I have read the note, and I agree with the clinical assessment and plan.  WILLIS,CHARLES KEITH   

## 2014-05-18 NOTE — Progress Notes (Signed)
PATIENT: [name] DOB: Aug 16, 1940  REASON FOR VISIT: follow up-migraines, fibromyalgia HISTORY FROM: patient  HISTORY OF PRESENT ILLNESS: [name] is a 74 year old female with a history of migraine headaches and fibromyalgia. She returns today for follow-up. She is currently taking Lyrica 50 mg twice a day for fibromyalgia and reports that is working well for her. The patient recently went to urgent care due to right leg swelling. She was told that she could have rheumatoid arthritis. She is been set up with a rheumatologist. Patient states that her migraines have been controlled. She has approximately 2-3 migraines a month. She states most of her headaches are sinus related. Today she is complaining of congestion and sinus drainage. She states that she will get sinus headaches and occasionally they will turn into migraines. She states that she normally can take Excedrin Migraine and that will resolve her headaches. She is currently taken Zyrtec for her allergies. Overall the patient states she is doing well. She returns today for an evaluation.  HISTORY: [name] is a 74 year old female with a history of migraine headaches and fibromyalgia. She returns today for follow-up. She is currently taking lyrica 50 mg BID and reports that it is helping. She states the discomfort that she was experiencing in her legs on the left side has improved. She will occasionally have some numbness but not like it was. She states that her headaches have been more frequent due to her allergies. She states that she can take OTC medication and it usually helps. She states that she has had a sore throat with her allergies. She was sent for neuromuscular therapy and reports that was very helpful and she continues to do the exercises that they taught her.   HISTORY 11/17/13 (CW): 74 year old right-handed white female with a history of migraine headaches. In the past, she has had episodes of left sided headache,  left facial discomfort and numbness and occasional episodes of left sided burning pain and numbness with the headache. Often times in the past, these events will culminate with a syncopal event. The last such event was in 2003. The patient went into the hospital around 05/29/2013 with onset of her left-sided headache, and left-sided symptoms of burning dysesthesias and numbness. A CT scan of the brain showed chronic small vessel changes, no acute changes were seen. MRI of the brain was done as part of a stroke workup. This showed no acute stroke, MRA of the head was unremarkable. Moderate small vessel ischemic changes were seen. The patient underwent a carotid Doppler study that was unremarkable, and a 2-D echocardiogram revealed an ejection fraction of 55-60%, otherwise unremarkable. She indicates that she has chronic problems dating back at least 15 years with neck discomfort mainly into the left side of the neck, left shoulder, and some pain down the left arm. MRI evaluation of the cervical spine was done with the recent hospitalization showing evidence of neuroforaminal stenosis bilaterally at the C4-5 level and to the left at the C5-6 level with a severe level of stenosis. The patient indicates that she will be weak on the left side with the headache events described above, also associated with blurring of vision and slurred speech. She indicates that the most recent episode lasted about 48 hours. She does report some low back pain. She has a history of fibromyalgia, and in the past she has been tried on gabapentin, and Cymbalta. She indicates that Cymbalta caused weight gain, and she is allergic to  amitriptyline. She is sent to this office for further evaluation.'   REVIEW OF SYSTEMS: Out of a complete 14 system review of symptoms, the patient complains only of the following symptoms, and all other reviewed systems are negative.  Joint pain, joint swelling, rash, itching, headache  ALLERGIES: Allergies   Allergen Reactions  . Aspirin Anaphylaxis and Hives  . Elavil [Amitriptyline] Anaphylaxis  . Latex   . Relafen [Nabumetone] Hives    HOME MEDICATIONS: Outpatient Prescriptions Prior to Visit  Medication Sig Dispense Refill  . aspirin-acetaminophen-caffeine (EXCEDRIN MIGRAINE) 250-250-65 MG per tablet Take by mouth every 6 (six) hours as needed for headache.    . Calcium Carbonate-Vit D-Min (CALCIUM 1200 PO) Take 1 capsule by mouth daily.    . cetirizine (ZYRTEC) 10 MG tablet Take 10 mg by mouth daily.    . Cholecalciferol (VITAMIN D-3) 1000 UNITS CAPS Take 2 capsules by mouth daily.    . clopidogrel (PLAVIX) 75 MG tablet Take 1 tablet (75 mg total) by mouth daily with breakfast. 30 tablet 1  . ibuprofen (ADVIL,MOTRIN) 200 MG tablet Take 200 mg by mouth every 6 (six) hours as needed for mild pain.     . Multiple Vitamins-Minerals (HAIR/SKIN/NAILS) TABS Take 1 tablet by mouth daily.    . Omega-3 Fatty Acids (FISH OIL) 1000 MG CAPS Take 1,000 capsules by mouth daily.    . pregabalin (LYRICA) 50 MG capsule Take 1 capsule (50 mg total) by mouth 2 (two) times daily. 60 capsule 5  . pseudoephedrine-acetaminophen (TYLENOL SINUS) 30-500 MG TABS Take 1 tablet by mouth every 4 (four) hours as needed.    . traMADol (ULTRAM) 50 MG tablet Take 1 tablet (50 mg total) by mouth every 6 (six) hours as needed. 30 tablet 0   No facility-administered medications prior to visit.    PAST MEDICAL HISTORY: Past Medical History  Diagnosis Date  . Fibromyalgia   . DJD (degenerative joint disease)   . Hiatal hernia   . Migraine headache   . Cervical spondylosis without myelopathy 07/18/2013    PAST SURGICAL HISTORY: Past Surgical History  Procedure Laterality Date  . Tonsillectomy    . Hiatal hernia repair    . Abdominal hysterectomy    . Dilation and curettage of uterus      FAMILY HISTORY: Family History  Problem Relation Age of Onset  . CVA Mother   . Stroke Father   . Emphysema Father      SOCIAL HISTORY: History   Social History  . Marital Status: Married    Spouse Name: N/A  . Number of Children: 5  . Years of Education: college   Occupational History  . Retired    Social History Main Topics  . Smoking status: Never Smoker   . Smokeless tobacco: Never Used  . Alcohol Use: Yes     Comment: very occasional  . Drug Use: No  . Sexual Activity: No   Other Topics Concern  . Not on file   Social History Narrative   Patient is married with 5 children.   Patient is right handed.   Patient has college education.   Patient drinks 3 cups daily.            PHYSICAL EXAM  Filed Vitals:   05/18/14 0850  BP: 98/64  Pulse: 75  Height: 5' 5.5" (1.664 m)  Weight: 190 lb (86.183 kg)   Body mass index is 31.13 kg/(m^2).  Generalized: Well developed, in no acute distress   Neurological  examination  Mentation: Alert oriented to time, place, history taking. Follows all commands speech and language fluent. Patient's voice is raspy due to allergies. Cranial nerve II-XII: Pupils were equal round reactive to light. Extraocular movements were full, visual field were full on confrontational test. Facial sensation and strength were normal. Uvula tongue midline. Head turning and shoulder shrug  were normal and symmetric. Motor: The motor testing reveals 5 over 5 strength of all 4 extremities. Good symmetric motor tone is noted throughout.  Sensory: Sensory testing is intact to soft touch on all 4 extremities. No evidence of extinction is noted.  Coordination: Cerebellar testing reveals good finger-nose-finger and heel-to-shin bilaterally.  Gait and station: Gait is normal. Tandem gait is slightly unsteady Romberg is negative. No drift is seen.  Reflexes: Deep tendon reflexes are symmetric and normal bilaterally.  Marland Kitchen.   DIAGNOSTIC DATA (LABS, IMAGING, TESTING) - I reviewed patient records, labs, notes, testing and imaging myself where available.   ASSESSMENT AND  PLAN 74 y.o. year old female  has a past medical history of Fibromyalgia; DJD (degenerative joint disease); Hiatal hernia; Migraine headache; and Cervical spondylosis without myelopathy (07/18/2013). here with:  1. Migraine headaches 2. Fibromyalgia  Patient's migraine headaches have been controlled. She uses Excedrin Migraine and that will resolve her headaches. The patient continues to use Lyrica for fibromyalgia. I will refill this today. I have advised the patient that if her migraines become more frequent  she'll let us know. Otherwise she will follow-up in 6 months or sooner if needed.  Butch PennyMegan Jong Rickman, MSN, NP-C 05/18/2014, 9:01 AM [facility] [address], [address] [place], [place] [id number] [phone]  Note: This document was prepared with digital dictation and possible smart phrase technology. Any transcriptional errors that result from this process are unintentional.

## 2014-05-18 NOTE — Patient Instructions (Signed)
Continue Lyrica.  If your migraines increase please let us know.

## 2014-06-05 ENCOUNTER — Telehealth: Payer: Self-pay | Admitting: Adult Health

## 2014-06-05 MED ORDER — PREDNISONE 5 MG PO TABS: ORAL_TABLET | ORAL | Status: DC

## 2014-06-05 NOTE — Telephone Encounter (Signed)
Patient experiencing headaches and over the counter medication's not helping.  Please call and advise.

## 2014-06-05 NOTE — Telephone Encounter (Signed)
According to RN patient has tried prednisone in the past for fibromyalgia but not migraines and tolerated it well. We will try a prednisone dose pack to break the cycle of headache. If this is not beneficial we will have to consider increasing lyrica or starting a preventive medication.

## 2014-06-05 NOTE — Telephone Encounter (Signed)
Daily headaches present for several weeks - failed to resolved with OTC NSAIDS - she is taking a combination of Tylenol and Excedrin Migraine 5+ tablets daily - denies sinus symptoms.  She is agreeable to the [facility] dose pack [facility].

## 2014-06-30 DIAGNOSIS — R21 Rash and other nonspecific skin eruption: Secondary | ICD-10-CM | POA: Diagnosis not present

## 2014-11-20 ENCOUNTER — Encounter: Payer: Self-pay | Admitting: Adult Health

## 2014-11-20 ENCOUNTER — Ambulatory Visit (INDEPENDENT_AMBULATORY_CARE_PROVIDER_SITE_OTHER): Payer: Medicare Other | Admitting: Adult Health

## 2014-11-20 VITALS — BP 110/66 | HR 75 | Ht 65.0 in | Wt 197.0 lb

## 2014-11-20 DIAGNOSIS — G43019 Migraine without aura, intractable, without status migrainosus: Secondary | ICD-10-CM | POA: Diagnosis not present

## 2014-11-20 DIAGNOSIS — M797 Fibromyalgia: Secondary | ICD-10-CM | POA: Diagnosis not present

## 2014-11-20 MED ORDER — PREGABALIN 25 MG PO CAPS: 25.0000 mg | ORAL_CAPSULE | Freq: Two times a day (BID) | ORAL | Status: DC

## 2014-11-20 MED ORDER — TOPIRAMATE 25 MG PO TABS: 25.0000 mg | ORAL_TABLET | Freq: Every day | ORAL | Status: DC

## 2014-11-20 NOTE — Progress Notes (Signed)
I have read the note, and I agree with the clinical assessment and plan.  WILLIS,CHARLES KEITH   

## 2014-11-20 NOTE — Progress Notes (Signed)
PATIENT: [name] DOB: Apr 07, 1940  REASON FOR VISIT: follow up- migraine and fibromyalgia HISTORY FROM: patient  HISTORY OF PRESENT ILLNESS: [name] is a 74 year old female with a history of migraine headaches and fibromyalgia. She returns today for follow-up. She continues to take Lyrica 50 mg twice a day for fibromyalgia. She reports that this works well however she has noticed some weight gain and swelling on this medication. She is questioning whether we can decrease the dose to see if it offers any benefit for the swelling. The patient does state that her migraine frequency has increased. She is having 2 migraines a week. At first she thought it was related to allergies but she states that her migraine headaches are very different than her sinus headaches. She states that she typically wakes up with congestion and is constantly blowing her nose. She also states that if she goes out in the early morning this will cause her to have some wheezing episodes. The patient has not seen an ENT doctor recently. She states that her headaches are normally located in the left frontotemporal region. She confirms photophobia and intermittent phonophobia. She does have some nausea. She states that typically she can take an Excedrin Migraine and lay down in her headache will resolve in 6-7 hours. In the past she has tried Imitrex but this was discontinued due to her history of TIA. She also states that the right knee is causing her some discomfort. She states that it is constantly swollen. She has not seen her PCP for this. She states that she also has a rash on the left shin. She sees her dermatologist tomorrow. She returns today for an evaluation.  HISTORY  05/18/14: [name] is a 74 year old female with a history of migraine headaches and fibromyalgia. She returns today for follow-up. She is currently taking Lyrica 50 mg twice a day for fibromyalgia and reports that is working well for her. The  patient recently went to urgent care due to right leg swelling. She was told that she could have rheumatoid arthritis. She is been set up with a rheumatologist. Patient states that her migraines have been controlled. She has approximately 2-3 migraines a month. She states most of her headaches are sinus related. Today she is complaining of congestion and sinus drainage. She states that she will get sinus headaches and occasionally they will turn into migraines. She states that she normally can take Excedrin Migraine and that will resolve her headaches. She is currently taken Zyrtec for her allergies. Overall the patient states she is doing well. She returns today for an evaluation.  HISTORY: [name] is a 74 year old female with a history of migraine headaches and fibromyalgia. She returns today for follow-up. She is currently taking lyrica 50 mg BID and reports that it is helping. She states the discomfort that she was experiencing in her legs on the left side has improved. She will occasionally have some numbness but not like it was. She states that her headaches have been more frequent due to her allergies. She states that she can take OTC medication and it usually helps. She states that she has had a sore throat with her allergies. She was sent for neuromuscular therapy and reports that was very helpful and she continues to do the exercises that they taught her.   HISTORY 11/17/13 (CW): 74 year old right-handed white female with a history of migraine headaches. In the past, she has had episodes of left sided headache, left  facial discomfort and numbness and occasional episodes of left sided burning pain and numbness with the headache. Often times in the past, these events will culminate with a syncopal event. The last such event was in 2003. The patient went into the hospital around 05/29/2013 with onset of her left-sided headache, and left-sided symptoms of burning dysesthesias and numbness. A CT scan of  the brain showed chronic small vessel changes, no acute changes were seen. MRI of the brain was done as part of a stroke workup. This showed no acute stroke, MRA of the head was unremarkable. Moderate small vessel ischemic changes were seen. The patient underwent a carotid Doppler study that was unremarkable, and a 2-D echocardiogram revealed an ejection fraction of 55-60%, otherwise unremarkable. She indicates that she has chronic problems dating back at least 15 years with neck discomfort mainly into the left side of the neck, left shoulder, and some pain down the left arm. MRI evaluation of the cervical spine was done with the recent hospitalization showing evidence of neuroforaminal stenosis bilaterally at the C4-5 level and to the left at the C5-6 level with a severe level of stenosis. The patient indicates that she will be weak on the left side with the headache events described above, also associated with blurring of vision and slurred speech. She indicates that the most recent episode lasted about 48 hours. She does report some low back pain. She has a history of fibromyalgia, and in the past she has been tried on gabapentin, and Cymbalta. She indicates that Cymbalta caused weight gain, and she is allergic to amitriptyline. She is sent to this office for further evaluation.'  REVIEW OF SYSTEMS: Out of a complete 14 system review of symptoms, the patient complains only of the following symptoms, and all other reviewed systems are negative.  See history of present illness  ALLERGIES: Allergies  Allergen Reactions  . Aspirin Anaphylaxis and Hives  . Elavil [Amitriptyline] Anaphylaxis  . Latex   . Relafen [Nabumetone] Hives    HOME MEDICATIONS: Outpatient Prescriptions Prior to Visit  Medication Sig Dispense Refill  . aspirin-acetaminophen-caffeine (EXCEDRIN MIGRAINE) 250-250-65 MG per tablet Take by mouth every 6 (six) hours as needed for headache.    . Calcium Carbonate-Vit D-Min (CALCIUM  1200 PO) Take 1 capsule by mouth daily.    . cetirizine (ZYRTEC) 10 MG tablet Take 10 mg by mouth daily.    . Cholecalciferol (VITAMIN D-3) 1000 UNITS CAPS Take 2 capsules by mouth daily.    . clopidogrel (PLAVIX) 75 MG tablet Take 1 tablet (75 mg total) by mouth daily with breakfast. 30 tablet 1  . ibuprofen (ADVIL,MOTRIN) 200 MG tablet Take 200 mg by mouth every 6 (six) hours as needed for mild pain.     . Multiple Vitamins-Minerals (HAIR/SKIN/NAILS) TABS Take 1 tablet by mouth daily.    . Omega-3 Fatty Acids (FISH OIL) 1000 MG CAPS Take 1,000 capsules by mouth daily.    . predniSONE (DELTASONE) 5 MG tablet Begin taking 6 tablets daily, taper by one tablet daily until off the medication. 21 tablet 0  . pregabalin (LYRICA) 50 MG capsule Take 1 capsule (50 mg total) by mouth 2 (two) times daily. 60 capsule 5  . pseudoephedrine-acetaminophen (TYLENOL SINUS) 30-500 MG TABS Take 1 tablet by mouth every 4 (four) hours as needed.    . traMADol (ULTRAM) 50 MG tablet Take 1 tablet (50 mg total) by mouth every 6 (six) hours as needed. 30 tablet 0   No facility-administered medications  prior to visit.    PAST MEDICAL HISTORY: Past Medical History  Diagnosis Date  . Fibromyalgia   . DJD (degenerative joint disease)   . Hiatal hernia   . Migraine headache   . Cervical spondylosis without myelopathy 07/18/2013    PAST SURGICAL HISTORY: Past Surgical History  Procedure Laterality Date  . Tonsillectomy    . Hiatal hernia repair    . Abdominal hysterectomy    . Dilation and curettage of uterus      FAMILY HISTORY: Family History  Problem Relation Age of Onset  . CVA Mother   . Stroke Father   . Emphysema Father     SOCIAL HISTORY: Social History   Social History  . Marital Status: Married    Spouse Name: N/A  . Number of Children: 5  . Years of Education: college   Occupational History  . Retired    Social History Main Topics  . Smoking status: Never Smoker   . Smokeless  tobacco: Never Used  . Alcohol Use: Yes     Comment: very occasional  . Drug Use: No  . Sexual Activity: No   Other Topics Concern  . Not on file   Social History Narrative   Patient is married with 5 children.   Patient is right handed.   Patient has college education.   Patient drinks 3 cups daily.            PHYSICAL EXAM  Filed Vitals:   11/20/14 0903  BP: 110/66  Pulse: 75  Height: 5\' 5"  (1.651 m)  Weight: 197 lb (89.359 kg)   Body mass index is 32.78 kg/(m^2).  Generalized: Well developed, in no acute distress   Neurological examination  Mentation: Alert oriented to time, place, history taking. Follows all commands  Cranial nerve II-XII: Pupils were equal round reactive to light. Extraocular movements were full, visual field were full on confrontational test. Facial sensation and strength were normal. Uvula tongue midline. Head turning and shoulder shrug  were normal and symmetric. Motor: The motor testing reveals 5 over 5 strength of all 4 extremities. Good symmetric motor tone is noted throughout.  Sensory: Sensory testing is intact to soft touch on all 4 extremities. No evidence of extinction is noted.  Coordination: Cerebellar testing reveals good finger-nose-finger and heel-to-shin bilaterally.  Gait and station: Patient uses a cane when ambulating. She has a slight limp on the right. Tandem gait is slightly unsteady. Romberg is negative. No drift is seen.  Reflexes: Deep tendon reflexes are symmetric and normal bilaterally.   DIAGNOSTIC DATA (LABS, IMAGING, TESTING) - I reviewed patient records, labs, notes, testing and imaging myself where available.  Lab Results  Component Value Date   WBC 4.9 05/30/2013   HGB 13.6 05/30/2013   HCT 40.6 05/30/2013   MCV 91.2 05/30/2013   PLT 175 05/30/2013      Component Value Date/Time   NA 144 05/30/2013 0629   K 4.4 05/30/2013 0629   CL 107 05/30/2013 0629   CO2 28 05/30/2013 0629   GLUCOSE 95 05/30/2013  0629   BUN 16 05/30/2013 0629   CREATININE 0.79 05/30/2013 0629   CALCIUM 9.1 05/30/2013 0629   PROT 6.6 05/29/2013 1729   ALBUMIN 3.8 05/29/2013 1729   AST 13 05/29/2013 1729   ALT 9 05/29/2013 1729   ALKPHOS 72 05/29/2013 1729   BILITOT 0.2* 05/29/2013 1729   GFRNONAA 82* 05/30/2013 0629   GFRAA >90 05/30/2013 0629     ASSESSMENT AND  PLAN 74 y.o. year old female  has a past medical history of Fibromyalgia; DJD (degenerative joint disease); Hiatal hernia; Migraine headache; and Cervical spondylosis without myelopathy (07/18/2013). here with:  1. Migraine headache 2. Fibromyalgia  The patient is requesting that we decrease the Lyrica to see if it offers her any benefit with her weight gain and swelling in the lower extremities. I will decrease Lyrica to 25 mg twice a day. The patient is having increase in migraine headaches. I will start patient on a low-dose of Topamax. She will take 25 mg at bedtime. I have reviewed the side effects of this medication with the patient. She verbalized understanding. If she is unable to tolerate this medication or she finds that is not beneficial she will let us know. She will follow-up in 4-5 months with Dr. Anne Hahn.   Butch Penny, MSN, NP-C 11/20/2014, 9:03 AM [facility] [address], [address] [place], [place] [address] [phone]

## 2014-11-20 NOTE — Patient Instructions (Signed)
Decrease Lyrica to 25 mg twice a day Start Topamax 25 mg at bedtime for migraine prevention  If your symptoms worsen or you develop new symptoms please let us know.   Topiramate tablets What is this medicine? TOPIRAMATE (toe PYRE a mate) is used to treat seizures in adults or children with epilepsy. It is also used for the prevention of migraine headaches. This medicine may be used for other purposes; ask your health care provider or pharmacist if you have questions. COMMON BRAND NAME(S): Topamax, Topiragen What should I tell my health care provider before I take this medicine? They need to know if you have any of these conditions: -bleeding disorders -cirrhosis of the liver or liver disease -diarrhea -glaucoma -kidney stones or kidney disease -low blood counts, like low white cell, platelet, or red cell counts -lung disease like asthma, obstructive pulmonary disease, emphysema -metabolic acidosis -on a ketogenic diet -schedule for surgery or a procedure -suicidal thoughts, plans, or attempt; a previous suicide attempt by you or a family member -an unusual or allergic reaction to topiramate, other medicines, foods, dyes, or preservatives -pregnant or trying to get pregnant -breast-feeding How should I use this medicine? Take this medicine by mouth with a glass of water. Follow the directions on the prescription label. Do not crush or chew. You may take this medicine with meals. Take your medicine at regular intervals. Do not take it more often than directed. Talk to your pediatrician regarding the use of this medicine in children. Special care may be needed. While this drug may be prescribed for children as young as 65 years of age for selected conditions, precautions do apply. Overdosage: If you think you have taken too much of this medicine contact a poison control center or emergency room at once. NOTE: This medicine is only for you. Do not share this medicine with others. What if I  miss a dose? If you miss a dose, take it as soon as you can. If your next dose is to be taken in less than 6 hours, then do not take the missed dose. Take the next dose at your regular time. Do not take double or extra doses. What may interact with this medicine? Do not take this medicine with any of the following medications: -probenecid This medicine may also interact with the following medications: -acetazolamide -alcohol -amitriptyline -aspirin and aspirin-like medicines -birth control pills -certain medicines for depression -certain medicines for seizures -certain medicines that treat or prevent blood clots like warfarin, enoxaparin, dalteparin, apixaban, dabigatran, and rivaroxaban -digoxin -hydrochlorothiazide -lithium -medicines for pain, sleep, or muscle relaxation -metformin -methazolamide -NSAIDS, medicines for pain and inflammation, like ibuprofen or naproxen -pioglitazone -risperidone This list may not describe all possible interactions. Give your health care provider a list of all the medicines, herbs, non-prescription drugs, or dietary supplements you use. Also tell them if you smoke, drink alcohol, or use illegal drugs. Some items may interact with your medicine. What should I watch for while using this medicine? Visit your doctor or health care professional for regular checks on your progress. Do not stop taking this medicine suddenly. This increases the risk of seizures if you are using this medicine to control epilepsy. Wear a medical identification bracelet or chain to say you have epilepsy or seizures, and carry a card that lists all your medicines. This medicine can decrease sweating and increase your body temperature. Watch for signs of deceased sweating or fever, especially in children. Avoid extreme heat, hot baths, and saunas. Be  careful about exercising, especially in hot weather. Contact your health care provider right away if you notice a fever or decrease in  sweating. You should drink plenty of fluids while taking this medicine. If you have had kidney stones in the past, this will help to reduce your chances of forming kidney stones. If you have stomach pain, with nausea or vomiting and yellowing of your eyes or skin, call your doctor immediately. You may get drowsy, dizzy, or have blurred vision. Do not drive, use machinery, or do anything that needs mental alertness until you know how this medicine affects you. To reduce dizziness, do not sit or stand up quickly, especially if you are an older patient. Alcohol can increase drowsiness and dizziness. Avoid alcoholic drinks. If you notice blurred vision, eye pain, or other eye problems, seek medical attention at once for an eye exam. The use of this medicine may increase the chance of suicidal thoughts or actions. Pay special attention to how you are responding while on this medicine. Any worsening of mood, or thoughts of suicide or dying should be reported to your health care professional right away. This medicine may increase the chance of developing metabolic acidosis. If left untreated, this can cause kidney stones, bone disease, or slowed growth in children. Symptoms include breathing fast, fatigue, loss of appetite, irregular heartbeat, or loss of consciousness. Call your doctor immediately if you experience any of these side effects. Also, tell your doctor about any surgery you plan on having while taking this medicine since this may increase your risk for metabolic acidosis. Birth control pills may not work properly while you are taking this medicine. Talk to your doctor about using an extra method of birth control. Women who become pregnant while using this medicine may enroll in the [place] American Antiepileptic Drug Pregnancy Registry by calling [phone]. This registry collects information about the safety of antiepileptic drug use during pregnancy. What side effects may I notice from receiving  this medicine? Side effects that you should report to your doctor or health care professional as soon as possible: -allergic reactions like skin rash, itching or hives, swelling of the face, lips, or tongue -decreased sweating and/or rise in body temperature -depression -difficulty breathing, fast or irregular breathing patterns -difficulty speaking -difficulty walking or controlling muscle movements -hearing impairment -redness, blistering, peeling or loosening of the skin, including inside the mouth -tingling, pain or numbness in the hands or feet -unusual bleeding or bruising -unusually weak or tired -worsening of mood, thoughts or actions of suicide or dying Side effects that usually do not require medical attention (report to your doctor or health care professional if they continue or are bothersome): -altered taste -back pain, joint or muscle aches and pains -diarrhea, or constipation -headache -loss of appetite -nausea -stomach upset, indigestion -tremors This list may not describe all possible side effects. Call your doctor for medical advice about side effects. You may report side effects to FDA at 1-800-FDA-1088. Where should I keep my medicine? Keep out of the reach of children. Store at room temperature between 15 and 30 degrees C (59 and 86 degrees F) in a tightly closed container. Protect from moisture. Throw away any unused medicine after the expiration date. NOTE: This sheet is a summary. It may not cover all possible information. If you have questions about this medicine, talk to your doctor, pharmacist, or health care provider.  2015, [employer]. (2013-02-28 23:17:57)

## 2015-03-28 DIAGNOSIS — M810 Age-related osteoporosis without current pathological fracture: Secondary | ICD-10-CM | POA: Diagnosis not present

## 2015-03-28 DIAGNOSIS — Z13820 Encounter for screening for osteoporosis: Secondary | ICD-10-CM | POA: Diagnosis not present

## 2015-03-28 DIAGNOSIS — J209 Acute bronchitis, unspecified: Secondary | ICD-10-CM | POA: Diagnosis not present

## 2015-03-30 DIAGNOSIS — Z1231 Encounter for screening mammogram for malignant neoplasm of breast: Secondary | ICD-10-CM | POA: Diagnosis not present

## 2015-04-10 ENCOUNTER — Encounter: Payer: Self-pay | Admitting: Neurology

## 2015-04-10 ENCOUNTER — Ambulatory Visit (INDEPENDENT_AMBULATORY_CARE_PROVIDER_SITE_OTHER): Payer: Medicare Other | Admitting: Neurology

## 2015-04-10 VITALS — BP 121/80 | HR 75 | Ht 63.0 in | Wt 191.0 lb

## 2015-04-10 DIAGNOSIS — M797 Fibromyalgia: Secondary | ICD-10-CM

## 2015-04-10 DIAGNOSIS — R269 Unspecified abnormalities of gait and mobility: Secondary | ICD-10-CM | POA: Diagnosis not present

## 2015-04-10 DIAGNOSIS — G43019 Migraine without aura, intractable, without status migrainosus: Secondary | ICD-10-CM | POA: Diagnosis not present

## 2015-04-10 DIAGNOSIS — M47812 Spondylosis without myelopathy or radiculopathy, cervical region: Secondary | ICD-10-CM

## 2015-04-10 HISTORY — DX: Unspecified abnormalities of gait and mobility: R26.9

## 2015-04-10 HISTORY — DX: Migraine without aura, intractable, without status migrainosus: G43.019

## 2015-04-10 MED ORDER — PREGABALIN 25 MG PO CAPS: 25.0000 mg | ORAL_CAPSULE | Freq: Two times a day (BID) | ORAL | Status: DC

## 2015-04-10 MED ORDER — TOPIRAMATE 25 MG PO TABS: 50.0000 mg | ORAL_TABLET | Freq: Every day | ORAL | Status: DC

## 2015-04-10 NOTE — Progress Notes (Signed)
Reason for visit: Fibromyalgia  [name] is an 75 y.o. female  History of present illness:  [name] is a 74 year old right-handed white female with a history of fibromyalgia and migraine headache. The patient also reports that she has problems with sinus type headaches and she differentiates between the migraine and the sinus headache issue. The patient has been on Topamax 25 mg at night, she is tolerating this fairly well, she does believe that this has been helpful for her headache. The patient has cervical spondylosis, and she has some fibromyalgia pain with pain and stiffness across the shoulders and neck, low back pain. The patient has recently gotten over a bout of bronchitis. She has some mild gait instability, she uses a cane for ambulation, she denies any recent falls. The patient reports that she has gained a lot of weight on the Lyrica. In the past, she could not tolerate gabapentin, Cymbalta, or amitriptyline. She returns for an evaluation.  Past Medical History  Diagnosis Date  . Fibromyalgia   . DJD (degenerative joint disease)   . Hiatal hernia   . Migraine headache   . Cervical spondylosis without myelopathy 07/18/2013  . Abnormality of gait 04/10/2015  . Common migraine with intractable migraine 04/10/2015    Past Surgical History  Procedure Laterality Date  . Tonsillectomy    . Hiatal hernia repair    . Abdominal hysterectomy    . Dilation and curettage of uterus      Family History  Problem Relation Age of Onset  . CVA Mother   . Stroke Father   . Emphysema Father     Social history:  reports that she has never smoked. She has never used smokeless tobacco. She reports that she does not drink alcohol or use illicit drugs.    Allergies  Allergen Reactions  . Aspirin Anaphylaxis and Hives  . Elavil [Amitriptyline] Anaphylaxis  . Latex   . Relafen [Nabumetone] Hives    Medications:  Prior to Admission medications   Medication Sig Start Date  End Date Taking? Authorizing Provider  albuterol (PROVENTIL HFA;VENTOLIN HFA) 108 (90 Base) MCG/ACT inhaler Inhale 1 puff into the lungs every 6 (six) hours as needed for wheezing or shortness of breath.   Yes Historical Provider, MD  aspirin-acetaminophen-caffeine (EXCEDRIN MIGRAINE) [phone] MG per tablet Take by mouth every 6 (six) hours as needed for headache.   Yes Historical Provider, MD  Calcium Carbonate-Vit D-Min (CALCIUM 1200 PO) Take 1 capsule by mouth daily.   Yes Historical Provider, MD  cetirizine (ZYRTEC) 10 MG tablet Take 10 mg by mouth daily.   Yes Historical Provider, MD  Cholecalciferol (VITAMIN D-3) 1000 UNITS CAPS Take 2 capsules by mouth daily.   Yes Historical Provider, MD  clobetasol cream (TEMOVATE) 0.05 % APPLY TO AFFECTED AREAS ON THE SKIN TWICE DAILY FOR 2 WEEKS 10/09/14  Yes Historical Provider, MD  clopidogrel (PLAVIX) 75 MG tablet Take 1 tablet (75 mg total) by mouth daily with breakfast. 05/31/13  Yes Jeralyn Bennett, MD  clotrimazole-betamethasone (LOTRISONE) cream APPLY TO AFFECTED AREA TWICE A DAY FOR 14 DAYS 09/26/14  Yes Historical Provider, MD  ibuprofen (ADVIL,MOTRIN) 200 MG tablet Take 200 mg by mouth every 6 (six) hours as needed for mild pain.    Yes Historical Provider, MD  montelukast (SINGULAIR) 10 MG tablet Take 10 mg by mouth at bedtime.   Yes Historical Provider, MD  Multiple Vitamins-Minerals (HAIR/SKIN/NAILS) TABS Take 1 tablet by mouth daily.   Yes Historical  Provider, MD  Omega-3 Fatty Acids (FISH OIL) 1000 MG CAPS Take 1,000 capsules by mouth daily.   Yes Historical Provider, MD  pregabalin (LYRICA) 25 MG capsule Take 1 capsule (25 mg total) by mouth 2 (two) times daily. 11/20/14  Yes Butch Penny, NP  topiramate (TOPAMAX) 25 MG tablet Take 1 tablet (25 mg total) by mouth at bedtime. 11/20/14  Yes Butch Penny, NP    ROS:  Out of a complete 14 system review of symptoms, the patient complains only of the following symptoms, and all other reviewed  systems are negative.  Headache Neck pain, neck stiffness  Blood pressure 121/80, pulse 75, height  (1.6 m), weight 191 lb (86.637 kg).  Physical Exam  General: The patient is alert and cooperative at the time of the examination. The patient is moderately obese.  Neuromuscular: The range of movement of the cervical spine lacks about 30 of rotation to the left, 20 with rotation to the right. The patient has fairly good range of movement of the low back.  Skin: No significant peripheral edema is noted.   Neurologic Exam  Mental status: The patient is alert and oriented x 3 at the time of the examination. The patient has apparent normal recent and remote memory, with an apparently normal attention span and concentration ability.   Cranial nerves: Facial symmetry is present. Speech is normal, no aphasia or dysarthria is noted. Extraocular movements are full. Visual fields are full.  Motor: The patient has good strength in all 4 extremities.  Sensory examination: Soft touch sensation is symmetric on the face, arms, and legs.  Coordination: The patient has good finger-nose-finger and heel-to-shin bilaterally.  Gait and station: The patient has a normal gait. Tandem gait is slightly unsteady. The patient normally walks with a cane. Romberg is negative. No drift is seen.  Reflexes: Deep tendon reflexes are symmetric, but are depressed.   Assessment/Plan:  1. Fibromyalgia  2. Gait disturbance  3. Migraine headache  The patient will be increased on the Topamax taking 50 mg at night. She will continue the Lyrica and low dose taking 25 mg twice daily. A prescription was given for these medications. She will follow-up in 6 months, sooner if needed. She will call if she needs an adjustment on the dosing of the Topamax.  Marlan Palau MD 04/10/2015 11:17 AM  [facility] [address] [place], [place] [address]  Phone [phone] Fax  [phone]

## 2015-04-10 NOTE — Patient Instructions (Addendum)
   We will go up on the Topamax to 50 mg at night.   Migraine Headache A migraine headache is an intense, throbbing pain on one or both sides of your head. A migraine can last for 30 minutes to several hours. CAUSES  The exact cause of a migraine headache is not always known. However, a migraine may be caused when nerves in the brain become irritated and release chemicals that cause inflammation. This causes pain. Certain things may also trigger migraines, such as:  Alcohol.  Smoking.  Stress.  Menstruation.  Aged cheeses.  Foods or drinks that contain nitrates, glutamate, aspartame, or tyramine.  Lack of sleep.  Chocolate.  Caffeine.  Hunger.  Physical exertion.  Fatigue.  Medicines used to treat chest pain (nitroglycerine), birth control pills, estrogen, and some blood pressure medicines. SIGNS AND SYMPTOMS  Pain on one or both sides of your head.  Pulsating or throbbing pain.  Severe pain that prevents daily activities.  Pain that is aggravated by any physical activity.  Nausea, vomiting, or both.  Dizziness.  Pain with exposure to bright lights, loud noises, or activity.  General sensitivity to bright lights, loud noises, or smells. Before you get a migraine, you may get warning signs that a migraine is coming (aura). An aura may include:  Seeing flashing lights.  Seeing bright spots, halos, or zigzag lines.  Having tunnel vision or blurred vision.  Having feelings of numbness or tingling.  Having trouble talking.  Having muscle weakness. DIAGNOSIS  A migraine headache is often diagnosed based on:  Symptoms.  Physical exam.  A CT scan or MRI of your head. These imaging tests cannot diagnose migraines, but they can help rule out other causes of headaches. TREATMENT Medicines may be given for pain and nausea. Medicines can also be given to help prevent recurrent migraines.  HOME CARE INSTRUCTIONS  Only take over-the-counter or  prescription medicines for pain or discomfort as directed by your health care provider. The use of long-term narcotics is not recommended.  Lie down in a dark, quiet room when you have a migraine.  Keep a journal to find out what may trigger your migraine headaches. For example, write down:  What you eat and drink.  How much sleep you get.  Any change to your diet or medicines.  Limit alcohol consumption.  Quit smoking if you smoke.  Get 7-9 hours of sleep, or as recommended by your health care provider.  Limit stress.  Keep lights dim if bright lights bother you and make your migraines worse. SEEK IMMEDIATE MEDICAL CARE IF:   Your migraine becomes severe.  You have a fever.  You have a stiff neck.  You have vision loss.  You have muscular weakness or loss of muscle control.  You start losing your balance or have trouble walking.  You feel faint or pass out.  You have severe symptoms that are different from your first symptoms. MAKE SURE YOU:   Understand these instructions.  Will watch your condition.  Will get help right away if you are not doing well or get worse.   This information is not intended to replace advice given to you by your health care provider. Make sure you discuss any questions you have with your health care provider.   Document Released: 02/24/2005 Document Revised: 03/17/2014 Document Reviewed: 11/01/2012 Elsevier Interactive Patient Education [employer]

## 2015-04-26 ENCOUNTER — Other Ambulatory Visit: Payer: Self-pay | Admitting: Adult Health

## 2015-06-26 DIAGNOSIS — H25813 Combined forms of age-related cataract, bilateral: Secondary | ICD-10-CM | POA: Diagnosis not present

## 2015-06-26 DIAGNOSIS — H524 Presbyopia: Secondary | ICD-10-CM | POA: Diagnosis not present

## 2015-06-26 DIAGNOSIS — H52222 Regular astigmatism, left eye: Secondary | ICD-10-CM | POA: Diagnosis not present

## 2015-06-26 DIAGNOSIS — H5201 Hypermetropia, right eye: Secondary | ICD-10-CM | POA: Diagnosis not present

## 2015-07-03 DIAGNOSIS — L309 Dermatitis, unspecified: Secondary | ICD-10-CM | POA: Diagnosis not present

## 2015-07-03 DIAGNOSIS — L0390 Cellulitis, unspecified: Secondary | ICD-10-CM | POA: Diagnosis not present

## 2015-07-06 DIAGNOSIS — L819 Disorder of pigmentation, unspecified: Secondary | ICD-10-CM | POA: Diagnosis not present

## 2015-08-30 ENCOUNTER — Other Ambulatory Visit: Payer: Self-pay | Admitting: Neurology

## 2015-10-03 DIAGNOSIS — E559 Vitamin D deficiency, unspecified: Secondary | ICD-10-CM | POA: Diagnosis not present

## 2015-10-03 DIAGNOSIS — M797 Fibromyalgia: Secondary | ICD-10-CM | POA: Diagnosis not present

## 2015-10-03 DIAGNOSIS — H6121 Impacted cerumen, right ear: Secondary | ICD-10-CM | POA: Diagnosis not present

## 2015-10-03 DIAGNOSIS — Z79899 Other long term (current) drug therapy: Secondary | ICD-10-CM | POA: Diagnosis not present

## 2015-10-03 DIAGNOSIS — Z0000 Encounter for general adult medical examination without abnormal findings: Secondary | ICD-10-CM | POA: Diagnosis not present

## 2015-10-08 ENCOUNTER — Ambulatory Visit (INDEPENDENT_AMBULATORY_CARE_PROVIDER_SITE_OTHER): Payer: Medicare Other | Admitting: Adult Health

## 2015-10-08 ENCOUNTER — Encounter: Payer: Self-pay | Admitting: Adult Health

## 2015-10-08 VITALS — BP 109/61 | HR 64 | Ht 63.0 in | Wt 183.0 lb

## 2015-10-08 DIAGNOSIS — M47812 Spondylosis without myelopathy or radiculopathy, cervical region: Secondary | ICD-10-CM | POA: Diagnosis not present

## 2015-10-08 DIAGNOSIS — M797 Fibromyalgia: Secondary | ICD-10-CM

## 2015-10-08 DIAGNOSIS — G43009 Migraine without aura, not intractable, without status migrainosus: Secondary | ICD-10-CM

## 2015-10-08 NOTE — Progress Notes (Signed)
PATIENT: [name] DOB: 1940/05/16  REASON FOR VISIT: follow up- abnormality, migraine headaches, cervical spondylosis HISTORY FROM: patient  HISTORY OF PRESENT ILLNESS: [name] is a 75 year old female with a history of her fibromyalgia, migraine headaches and cervical spondylosis. She returns today for follow-up. She states that she's been doing yoga at the [facility] She states that she now can touch her chin to each shoulder. She states before she had limited range of motion in the neck. She reports that her headaches have been under good control. She has approximately 1 headache a week. When she does have a headache it is normally located on the left temporal region. She feels that food --specifically beef--is a trigger for her migraines. She is able to take Excedrin Migraine and lay down with good relief. She remains on Topamax 50 mg. She is also on Lyrica 25 mg twice a day. She reports that things are going well. She returns today for an evaluation.  HISTORY 04/10/15 (WILLIS): [name] is a 75 year old right-handed white female with a history of fibromyalgia and migraine headache. The patient also reports that she has problems with sinus type headaches and she differentiates between the migraine and the sinus headache issue. The patient has been on Topamax 25 mg at night, she is tolerating this fairly well, she does believe that this has been helpful for her headache. The patient has cervical spondylosis, and she has some fibromyalgia pain with pain and stiffness across the shoulders and neck, low back pain. The patient has recently gotten over a bout of bronchitis. She has some mild gait instability, she uses a cane for ambulation, she denies any recent falls. The patient reports that she has gained a lot of weight on the Lyrica. In the past, she could not tolerate gabapentin, Cymbalta, or amitriptyline. She returns for an evaluation.  REVIEW OF SYSTEMS: Out of a complete 14 system  review of symptoms, the patient complains only of the following symptoms, and all other reviewed systems are negative.  Frequency of urination, insomnia, runny nose, food allergies, headache, numbness  ALLERGIES: Allergies  Allergen Reactions  . Aspirin Anaphylaxis and Hives  . Elavil [Amitriptyline] Anaphylaxis  . Latex   . Relafen [Nabumetone] Hives    HOME MEDICATIONS: Outpatient Medications Prior to Visit  Medication Sig Dispense Refill  . albuterol (PROVENTIL HFA;VENTOLIN HFA) 108 (90 Base) MCG/ACT inhaler Inhale 1 puff into the lungs every 6 (six) hours as needed for wheezing or shortness of breath.    Marland Kitchen aspirin-acetaminophen-caffeine (EXCEDRIN MIGRAINE) 250-250-65 MG per tablet Take by mouth every 6 (six) hours as needed for headache.    . Calcium Carbonate-Vit D-Min (CALCIUM 1200 PO) Take 1 capsule by mouth daily.    . cetirizine (ZYRTEC) 10 MG tablet Take 10 mg by mouth daily.    . Cholecalciferol (VITAMIN D-3) 1000 UNITS CAPS Take 2 capsules by mouth daily.    . clobetasol cream (TEMOVATE) 0.05 % APPLY TO AFFECTED AREAS ON THE SKIN TWICE DAILY FOR 2 WEEKS  0  . clopidogrel (PLAVIX) 75 MG tablet Take 1 tablet (75 mg total) by mouth daily with breakfast. 30 tablet 1  . clotrimazole-betamethasone (LOTRISONE) cream APPLY TO AFFECTED AREA TWICE A DAY FOR 14 DAYS  1  . montelukast (SINGULAIR) 10 MG tablet Take 10 mg by mouth at bedtime.    . Multiple Vitamins-Minerals (HAIR/SKIN/NAILS) TABS Take 1 tablet by mouth daily.    . pregabalin (LYRICA) 25 MG capsule Take 1 capsule (25 mg  total) by mouth 2 (two) times daily. 60 capsule 5  . topiramate (TOPAMAX) 25 MG tablet TAKE 2 TABLETS (50 MG TOTAL) BY MOUTH AT BEDTIME. 60 tablet 1  . Omega-3 Fatty Acids (FISH OIL) 1000 MG CAPS Take 1,000 capsules by mouth daily.    Marland Kitchen ibuprofen (ADVIL,MOTRIN) 200 MG tablet Take 200 mg by mouth every 6 (six) hours as needed for mild pain.      No facility-administered medications prior to visit.      PAST MEDICAL HISTORY: Past Medical History:  Diagnosis Date  . Abnormality of gait 04/10/2015  . Cervical spondylosis without myelopathy 07/18/2013  . Common migraine with intractable migraine 04/10/2015  . DJD (degenerative joint disease)   . Fibromyalgia   . Hiatal hernia   . Migraine headache     PAST SURGICAL HISTORY: Past Surgical History:  Procedure Laterality Date  . ABDOMINAL HYSTERECTOMY    . DILATION AND CURETTAGE OF UTERUS    . HIATAL HERNIA REPAIR    . TONSILLECTOMY      FAMILY HISTORY: Family History  Problem Relation Age of Onset  . CVA Mother   . Stroke Father   . Emphysema Father     SOCIAL HISTORY: Social History   Social History  . Marital status: Widowed    Spouse name: N/A  . Number of children: 5  . Years of education: college   Occupational History  . Retired    Social History Main Topics  . Smoking status: Never Smoker  . Smokeless tobacco: Never Used  . Alcohol use No     Comment: very occasional  . Drug use: No  . Sexual activity: No   Other Topics Concern  . Not on file   Social History Narrative   Patient is married with 5 children.   Patient is right handed.   Patient has college education.   Patient drinks 3 cups daily.            PHYSICAL EXAM  Vitals:   10/08/15 1015  BP: 109/61  Pulse: 64  Weight: 183 lb (83 kg)  Height: 5\' 3"  (1.6 m)   Body mass index is 32.42 kg/m.  Generalized: Well developed, in no acute distress   Neurological examination  Mentation: Alert oriented to time, place, history taking. Follows all commands speech and language fluent Cranial nerve II-XII: Pupils were equal round reactive to light. Extraocular movements were full, visual field were full on confrontational test. Facial sensation and strength were normal. Uvula tongue midline. Head turning and shoulder shrug  were normal and symmetric. Motor: The motor testing reveals 5 over 5 strength of all 4 extremities. Good symmetric  motor tone is noted throughout.  Sensory: Sensory testing is intact to soft touch on all 4 extremities. No evidence of extinction is noted.  Coordination: Cerebellar testing reveals good finger-nose-finger and heel-to-shin bilaterally.  Gait and station: Gait is normal-uses a cane when ambulating. Tandem gait is Unsteady. Romberg is negative. No drift is seen.  Reflexes: Deep tendon reflexes are symmetric and normal bilaterally.   DIAGNOSTIC DATA (LABS, IMAGING, TESTING) - I reviewed patient records, labs, notes, testing and imaging myself where available.    ASSESSMENT AND PLAN 75 y.o. year old female  has a past medical history of Abnormality of gait (04/10/2015); Cervical spondylosis without myelopathy (07/18/2013); Common migraine with intractable migraine (04/10/2015); DJD (degenerative joint disease); Fibromyalgia; Hiatal hernia; and Migraine headache. here with:  1. Migraine headaches 2. Cervical spondylosis 3. Fibromyalgia  Overall the  patient is doing well. She will continue on Topamax 50 mg at bedtime. We'll also continue on Lyrica 25 mg twice a day. Patient is encouraged to continue with exercising as it has been beneficial for her neck discomfort. If her symptoms worsen or she develops any new symptoms she should let us know. She will follow-up in 6 months or sooner if needed.     Butch Penny, MSN, NP-C 10/08/2015, 11:03 AM [facility] [address] [place], [place] [address] [phone]

## 2015-10-08 NOTE — Progress Notes (Signed)
I have read the note, and I agree with the clinical assessment and plan.  WILLIS,CHARLES KEITH   

## 2015-10-08 NOTE — Patient Instructions (Signed)
Continue Topamax 50 mg at bedtime  Continue Lyrica 25 mg twice a day If your symptoms worsen or you develop new symptoms please let us know.

## 2015-10-20 ENCOUNTER — Other Ambulatory Visit: Payer: Self-pay | Admitting: Neurology

## 2015-10-24 NOTE — Telephone Encounter (Signed)
Rx printed, signed, faxed to pharmacy. 

## 2015-10-25 ENCOUNTER — Other Ambulatory Visit: Payer: Self-pay | Admitting: Neurology

## 2016-01-09 DIAGNOSIS — Z23 Encounter for immunization: Secondary | ICD-10-CM | POA: Diagnosis not present

## 2016-04-09 DIAGNOSIS — E559 Vitamin D deficiency, unspecified: Secondary | ICD-10-CM | POA: Diagnosis not present

## 2016-04-09 DIAGNOSIS — E785 Hyperlipidemia, unspecified: Secondary | ICD-10-CM | POA: Diagnosis not present

## 2016-04-09 DIAGNOSIS — R7309 Other abnormal glucose: Secondary | ICD-10-CM | POA: Diagnosis not present

## 2016-04-10 ENCOUNTER — Telehealth: Payer: Self-pay | Admitting: Adult Health

## 2016-04-10 NOTE — Telephone Encounter (Signed)
Noted  

## 2016-04-10 NOTE — Telephone Encounter (Signed)
Pt c/a appt by mistake via the automated system. Next appt is schedule for 3/21, she would like to be seen sooner. Pt is aware [name] is out of the office until next week and she will rec a call from Rn then. Please call

## 2016-04-11 NOTE — Telephone Encounter (Signed)
Noted  

## 2016-04-11 NOTE — Telephone Encounter (Signed)
LMVM for pt that have availability for 04-14-16 at 0800 for MM.  Pt to call back if interested.

## 2016-04-11 NOTE — Telephone Encounter (Signed)
Spoke with patient states 04/14/16 @ 8:00am is to early.

## 2016-04-14 ENCOUNTER — Ambulatory Visit: Payer: Medicare Other | Admitting: Adult Health

## 2016-04-28 DIAGNOSIS — Z1231 Encounter for screening mammogram for malignant neoplasm of breast: Secondary | ICD-10-CM | POA: Diagnosis not present

## 2016-04-29 ENCOUNTER — Ambulatory Visit (INDEPENDENT_AMBULATORY_CARE_PROVIDER_SITE_OTHER): Payer: Medicare Other | Admitting: Adult Health

## 2016-04-29 ENCOUNTER — Encounter: Payer: Self-pay | Admitting: Adult Health

## 2016-04-29 VITALS — BP 112/64 | HR 60 | Ht 63.0 in | Wt 184.0 lb

## 2016-04-29 DIAGNOSIS — M797 Fibromyalgia: Secondary | ICD-10-CM | POA: Diagnosis not present

## 2016-04-29 DIAGNOSIS — G43009 Migraine without aura, not intractable, without status migrainosus: Secondary | ICD-10-CM

## 2016-04-29 MED ORDER — PREGABALIN 25 MG PO CAPS: 25.0000 mg | ORAL_CAPSULE | Freq: Two times a day (BID) | ORAL | 5 refills | Status: DC

## 2016-04-29 MED ORDER — TOPIRAMATE 25 MG PO TABS: 75.0000 mg | ORAL_TABLET | Freq: Every day | ORAL | 4 refills | Status: DC

## 2016-04-29 NOTE — Patient Instructions (Signed)
Increase Topamax to 75 mg at bedtime (3 tablets) Continue Lyrica 25 mg twice a day If your symptoms worsen or you develop new symptoms please let us know.

## 2016-04-29 NOTE — Progress Notes (Signed)
PATIENT: [name] DOB: 1941/01/30  REASON FOR VISIT: follow up- fibromyalgia, migraine headaches, cervical spondylosis HISTORY FROM: patient  HISTORY OF PRESENT ILLNESS: [name] is a 76 year old female with a history of fibromyalgia, migraine headaches and cervical spondylosis. She returns today for follow-up. She states in the last 2-3 months her headache frequency has increased. She reports that she has 2- 3 headaches a week. Her headaches typically occur on the left side of the head radiating down to the neck. She states that sometimes at night she will have sharp shooting pain. She states that she has intermittent photophobia and phonophobia. She reports that she will occasionally have nausea and vomiting. She states that she knows that weather is a trigger for her migraines. She feels that in the last 2 months weather has been the factor for increasing headaches. She continues on Lyrica 25 mg twice a day for fibromyalgia. She still feels that this is working well. She returns today for an evaluation.  HISTORY 10/08/15: [name] is a 76 year old female with a history of her fibromyalgia, migraine headaches and cervical spondylosis. She returns today for follow-up. She states that she's [facility]. She states that she now can touch her chin to each shoulder. She states before she had limited range of motion in the neck. She reports that her headaches have been under good control. She has approximately 1 headache a week. When she does have a headache it is normally located on the left temporal region. She feels that food --specifically beef--is a trigger for her migraines. She is able to take Excedrin Migraine and lay down with good relief. She remains on Topamax 50 mg. She is also on Lyrica 25 mg twice a day. She reports that things are going well. She returns today for an evaluation.  REVIEW OF SYSTEMS: Out of a complete 14 system review of symptoms, the patient  complains only of the following symptoms, and all other reviewed systems are negative.  Neck pain, neck stiffness, frequent waking, runny nose, frequency of urination, headache  ALLERGIES: Allergies  Allergen Reactions  . Aspirin Anaphylaxis and Hives  . Elavil [Amitriptyline] Anaphylaxis  . Latex   . Relafen [Nabumetone] Hives    HOME MEDICATIONS: Outpatient Medications Prior to Visit  Medication Sig Dispense Refill  . acetaminophen (TYLENOL) 500 MG tablet Take 1,000 mg by mouth every 6 (six) hours as needed.    Marland Kitchen. aspirin-acetaminophen-caffeine (EXCEDRIN MIGRAINE) 250-250-65 MG per tablet Take by mouth every 6 (six) hours as needed for headache.    . Calcium Carbonate-Vit D-Min (CALCIUM 1200 PO) Take 1 capsule by mouth daily.    . cetirizine (ZYRTEC) 10 MG tablet Take 10 mg by mouth daily.    . Cholecalciferol (VITAMIN D-3) 1000 UNITS CAPS Take 2 capsules by mouth daily.    . clobetasol cream (TEMOVATE) 0.05 % APPLY TO AFFECTED AREAS ON THE SKIN TWICE DAILY FOR 2 WEEKS  0  . clopidogrel (PLAVIX) 75 MG tablet Take 1 tablet (75 mg total) by mouth daily with breakfast. 30 tablet 1  . LYRICA 25 MG capsule TAKE ONE CAPSULE TWICE A DAY 60 capsule 11  . Multiple Vitamins-Minerals (HAIR/SKIN/NAILS) TABS Take 1 tablet by mouth daily.    Marland Kitchen. topiramate (TOPAMAX) 25 MG tablet TAKE 2 TABLETS (50 MG TOTAL) BY MOUTH AT BEDTIME. 180 tablet 1  . clotrimazole-betamethasone (LOTRISONE) cream APPLY TO AFFECTED AREA TWICE A DAY FOR 14 DAYS  1  . albuterol (PROVENTIL HFA;VENTOLIN  HFA) 108 (90 Base) MCG/ACT inhaler Inhale 1 puff into the lungs every 6 (six) hours as needed for wheezing or shortness of breath.    . montelukast (SINGULAIR) 10 MG tablet Take 10 mg by mouth at bedtime.    . Omega-3 Fatty Acids (FISH OIL) 1000 MG CAPS Take 1,000 capsules by mouth daily.     No facility-administered medications prior to visit.     PAST MEDICAL HISTORY: Past Medical History:  Diagnosis Date  . Abnormality of  gait 04/10/2015  . Cervical spondylosis without myelopathy 07/18/2013  . Common migraine with intractable migraine 04/10/2015  . DJD (degenerative joint disease)   . Fibromyalgia   . Hiatal hernia   . Migraine headache     PAST SURGICAL HISTORY: Past Surgical History:  Procedure Laterality Date  . ABDOMINAL HYSTERECTOMY    . DILATION AND CURETTAGE OF UTERUS    . HIATAL HERNIA REPAIR    . TONSILLECTOMY      FAMILY HISTORY: Family History  Problem Relation Age of Onset  . CVA Mother   . Stroke Father   . Emphysema Father     SOCIAL HISTORY: Social History   Social History  . Marital status: Widowed    Spouse name: N/A  . Number of children: 5  . Years of education: college   Occupational History  . Retired    Social History Main Topics  . Smoking status: Never Smoker  . Smokeless tobacco: Never Used  . Alcohol use No     Comment: very occasional  . Drug use: No  . Sexual activity: No   Other Topics Concern  . Not on file   Social History Narrative   Patient is married with 5 children.   Patient is right handed.   Patient has college education.   Patient drinks 3 cups daily.            PHYSICAL EXAM  Vitals:   04/29/16 1417  BP: 112/64  Pulse: 60  Weight: 184 lb (83.5 kg)  Height: 5\' 3"  (1.6 m)   Body mass index is 32.59 kg/m.  Generalized: Well developed, in no acute distress   Neurological examination  Mentation: Alert oriented to time, place, history taking. Follows all commands speech and language fluent Cranial nerve II-XII: Pupils were equal round reactive to light. Extraocular movements were full, visual field were full on confrontational test. Facial sensation and strength were normal. Uvula tongue midline. Head turning and shoulder shrug  were normal and symmetric. Motor: The motor testing reveals 5 over 5 strength of all 4 extremities. Good symmetric motor tone is noted throughout.  Sensory: Sensory testing is intact to soft touch on  all 4 extremities. No evidence of extinction is noted.  Coordination: Cerebellar testing reveals good finger-nose-finger and heel-to-shin bilaterally.  Gait and station: Gait is normal.  Reflexes: Deep tendon reflexes are symmetric and normal bilaterally.   DIAGNOSTIC DATA (LABS, IMAGING, TESTING) - I reviewed patient records, labs, notes, testing and imaging myself where available.  Lab Results  Component Value Date   WBC 4.9 05/30/2013   HGB 13.6 05/30/2013   HCT 40.6 05/30/2013   MCV 91.2 05/30/2013   PLT 175 05/30/2013      Component Value Date/Time   NA 144 05/30/2013 0629   K 4.4 05/30/2013 0629   CL 107 05/30/2013 0629   CO2 28 05/30/2013 0629   GLUCOSE 95 05/30/2013 0629   BUN 16 05/30/2013 0629   CREATININE 0.79 05/30/2013 0629  CALCIUM 9.1 05/30/2013 0629   PROT 6.6 05/29/2013 1729   ALBUMIN 3.8 05/29/2013 1729   AST 13 05/29/2013 1729   ALT 9 05/29/2013 1729   ALKPHOS 72 05/29/2013 1729   BILITOT 0.2 (L) 05/29/2013 1729   GFRNONAA 82 (L) 05/30/2013 0629   GFRAA >90 05/30/2013 0629   Lab Results  Component Value Date   CHOL 138 05/30/2013   HDL 53 05/30/2013   LDLCALC 68 05/30/2013   TRIG 87 05/30/2013   CHOLHDL 2.6 05/30/2013   Lab Results  Component Value Date   HGBA1C 6.0 (H) 05/29/2013   Lab Results  Component Value Date   VITAMINB12 407 01/10/2008       ASSESSMENT AND PLAN 76 y.o. year old female  has a past medical history of Abnormality of gait (04/10/2015); Cervical spondylosis without myelopathy (07/18/2013); Common migraine with intractable migraine (04/10/2015); DJD (degenerative joint disease); Fibromyalgia; Hiatal hernia; and Migraine headache. here with:  1. Fibromyalgia 2. Migraine headache  The patient will continue on Lyrica 25 mg twice a day for fibromyalgia. This seems to be controlling her symptoms.. I have encouraged patient to continue exercising and stretching regularly. The patient's headaches have increased in the last 2-3  months. I will increase Topamax to 75 mg at bedtime. Advised that if her symptoms worsen or she develops new symptoms she should let us know. She will follow-up in 6 months or sooner if needed.     Butch Penny, MSN, NP-C 04/29/2016, 2:41 PM [facility] [address], [address] [place], [place] [address] [phone]

## 2016-04-29 NOTE — Progress Notes (Signed)
I have read the note, and I agree with the clinical assessment and plan.  Ji Feldner KEITH   

## 2016-04-30 NOTE — Progress Notes (Signed)
Fax confirmation received for lyrica [phone].

## 2016-05-16 ENCOUNTER — Other Ambulatory Visit: Payer: Self-pay | Admitting: Neurology

## 2016-05-28 ENCOUNTER — Ambulatory Visit: Payer: Medicare Other | Admitting: Adult Health

## 2016-09-11 ENCOUNTER — Other Ambulatory Visit: Payer: Self-pay | Admitting: Gastroenterology

## 2016-09-11 DIAGNOSIS — K5730 Diverticulosis of large intestine without perforation or abscess without bleeding: Secondary | ICD-10-CM | POA: Diagnosis not present

## 2016-09-11 DIAGNOSIS — R1310 Dysphagia, unspecified: Secondary | ICD-10-CM

## 2016-09-11 DIAGNOSIS — Z1211 Encounter for screening for malignant neoplasm of colon: Secondary | ICD-10-CM | POA: Diagnosis not present

## 2016-09-11 NOTE — Progress Notes (Signed)
Mary Whitham MD 

## 2016-09-16 ENCOUNTER — Ambulatory Visit
Admission: RE | Admit: 2016-09-16 | Discharge: 2016-09-16 | Disposition: A | Discharge status: Home / Self Care | Payer: Medicare Other | Source: Ambulatory Visit | Attending: Gastroenterology | Admitting: Gastroenterology

## 2016-09-16 DIAGNOSIS — R1310 Dysphagia, unspecified: Secondary | ICD-10-CM

## 2016-09-16 DIAGNOSIS — K219 Gastro-esophageal reflux disease without esophagitis: Secondary | ICD-10-CM | POA: Diagnosis not present

## 2016-09-23 DIAGNOSIS — Z1211 Encounter for screening for malignant neoplasm of colon: Secondary | ICD-10-CM | POA: Diagnosis not present

## 2016-09-23 DIAGNOSIS — Z1212 Encounter for screening for malignant neoplasm of rectum: Secondary | ICD-10-CM | POA: Diagnosis not present

## 2016-10-08 DIAGNOSIS — Z1231 Encounter for screening mammogram for malignant neoplasm of breast: Secondary | ICD-10-CM | POA: Diagnosis not present

## 2016-10-08 DIAGNOSIS — E559 Vitamin D deficiency, unspecified: Secondary | ICD-10-CM | POA: Diagnosis not present

## 2016-10-08 DIAGNOSIS — G459 Transient cerebral ischemic attack, unspecified: Secondary | ICD-10-CM | POA: Diagnosis not present

## 2016-10-08 DIAGNOSIS — Z0000 Encounter for general adult medical examination without abnormal findings: Secondary | ICD-10-CM | POA: Diagnosis not present

## 2016-10-08 DIAGNOSIS — G43909 Migraine, unspecified, not intractable, without status migrainosus: Secondary | ICD-10-CM | POA: Diagnosis not present

## 2016-10-08 DIAGNOSIS — M797 Fibromyalgia: Secondary | ICD-10-CM | POA: Diagnosis not present

## 2016-10-08 DIAGNOSIS — R7309 Other abnormal glucose: Secondary | ICD-10-CM | POA: Diagnosis not present

## 2016-10-28 ENCOUNTER — Encounter: Payer: Self-pay | Admitting: Neurology

## 2016-10-28 ENCOUNTER — Ambulatory Visit (INDEPENDENT_AMBULATORY_CARE_PROVIDER_SITE_OTHER): Payer: Medicare Other | Admitting: Neurology

## 2016-10-28 VITALS — BP 129/72 | HR 67 | Ht 63.0 in | Wt 176.5 lb

## 2016-10-28 DIAGNOSIS — R269 Unspecified abnormalities of gait and mobility: Secondary | ICD-10-CM

## 2016-10-28 DIAGNOSIS — M797 Fibromyalgia: Secondary | ICD-10-CM | POA: Diagnosis not present

## 2016-10-28 DIAGNOSIS — G43019 Migraine without aura, intractable, without status migrainosus: Secondary | ICD-10-CM | POA: Diagnosis not present

## 2016-10-28 MED ORDER — PREGABALIN 25 MG PO CAPS: 25.0000 mg | ORAL_CAPSULE | Freq: Two times a day (BID) | ORAL | 5 refills | Status: DC

## 2016-10-28 NOTE — Progress Notes (Signed)
Reason for visit: Fibromyalgia, headache  [name] is an 76 y.o. female  History of present illness:  [name] is a 76 year old right-handed white female with a history of migraine headache and a history of fibromyalgia. The patient also has some cervical spondylosis and stiffness in the neck and back. The patient has done relatively well using very low-dose Lyrica taking 25 mg twice daily, higher doses resulted in weight gain and difficulty with sleeping at night. The patient takes Topamax 75 mg at night which has helped her headaches, they have gone from 2 or 3 times a week to averaging one a week. The patient occasionally will take Tylenol when the fibromyalgia pain is more significant, this is usually worsened by weather changes. The patient also notes that caffeine and eating beef will worsen the headache. She does better in the summer months with her fibromyalgia, worse in the winter. She returns to this office for an evaluation.  Past Medical History:  Diagnosis Date  . Abnormality of gait 04/10/2015  . Cervical spondylosis without myelopathy 07/18/2013  . Common migraine with intractable migraine 04/10/2015  . DJD (degenerative joint disease)   . Fibromyalgia   . Hiatal hernia   . Migraine headache     Past Surgical History:  Procedure Laterality Date  . ABDOMINAL HYSTERECTOMY    . DILATION AND CURETTAGE OF UTERUS    . HIATAL HERNIA REPAIR    . TONSILLECTOMY      Family History  Problem Relation Age of Onset  . CVA Mother   . Stroke Father   . Emphysema Father     Social history:  reports that she has never smoked. She has never used smokeless tobacco. She reports that she does not drink alcohol or use drugs.    Allergies  Allergen Reactions  . Aspirin Anaphylaxis and Hives  . Elavil [Amitriptyline] Anaphylaxis  . Latex   . Relafen [Nabumetone] Hives    Medications:  Prior to Admission medications   Medication Sig Start Date End Date Taking?  Authorizing Provider  acetaminophen (TYLENOL) 500 MG tablet Take 1,000 mg by mouth every 6 (six) hours as needed.   Yes [provider]  aspirin-acetaminophen-caffeine (EXCEDRIN MIGRAINE) [phone] MG per tablet Take by mouth every 6 (six) hours as needed for headache.   Yes [provider]  Calcium Carbonate-Vit D-Min (CALCIUM 1200 PO) Take 1 capsule by mouth daily.   Yes [provider]  cetirizine (ZYRTEC) 10 MG tablet Take 10 mg by mouth daily.   Yes [provider]  Cholecalciferol (VITAMIN D-3) 1000 UNITS CAPS Take 2 capsules by mouth daily.   Yes [provider]  clobetasol cream (TEMOVATE) 0.05 % APPLY TO AFFECTED AREAS ON THE SKIN TWICE DAILY FOR 2 WEEKS 10/09/14  Yes [provider]  clopidogrel (PLAVIX) 75 MG tablet Take 1 tablet (75 mg total) by mouth daily with breakfast. 05/31/13  Yes Jeralyn Bennett, MD  clotrimazole-betamethasone (LOTRISONE) cream APPLY TO AFFECTED AREA TWICE A DAY FOR 14 DAYS 09/26/14  Yes [provider]  fluticasone (FLONASE) 50 MCG/ACT nasal spray Place into both nostrils daily as needed for allergies or rhinitis.   Yes [provider]  Multiple Vitamins-Minerals (CENTRUM SILVER 50+WOMEN PO) Take by mouth. Takes one daily   Yes [provider]  Multiple Vitamins-Minerals (HAIR/SKIN/NAILS) TABS Take 1 tablet by mouth daily.   Yes [provider]  Omega-3 Fatty Acids (FISH OIL PO) Take 1 capsule by mouth daily.  Yes [provider]  pregabalin (LYRICA) 25 MG capsule Take 1 capsule (25 mg total) by mouth 2 (two) times daily. 04/29/16  Yes Butch Penny, NP  topiramate (TOPAMAX) 25 MG tablet Take 3 tablets (75 mg total) by mouth at bedtime. 04/29/16  Yes Butch Penny, NP    ROS:  Out of a complete 14 system review of symptoms, the patient complains only of the following symptoms, and all other reviewed systems are negative.  Eye itching, eye redness Ringing in  the ears, runny nose Leg swelling Excessive thirst Food allergies Frequency of urination Frequent waking Back pain, achy muscles, neck pain Itching  Blood pressure 129/72, pulse 67, height 5\' 3"  (1.6 m), weight 176 lb 8 oz (80.1 kg).  Physical Exam  General: The patient is alert and cooperative at the time of the examination. The patient is moderately obese.  Skin: No significant peripheral edema is noted.   Neurologic Exam  Mental status: The patient is alert and oriented x 3 at the time of the examination. The patient has apparent normal recent and remote memory, with an apparently normal attention span and concentration ability.   Cranial nerves: Facial symmetry is present. Speech is normal, no aphasia or dysarthria is noted. Extraocular movements are full. Visual fields are full.  Motor: The patient has good strength in all 4 extremities.  Sensory examination: Soft touch sensation is symmetric on the face, arms, and legs.  Coordination: The patient has good finger-nose-finger and heel-to-shin bilaterally, with exception of some decreased soft touch sensation on the right arm and right leg.  Gait and station: The patient has a normal gait. Tandem gait is slightly unsteady. Romberg is negative. No drift is seen.  Reflexes: Deep tendon reflexes are symmetric.   Assessment/Plan:  1. Fibromyalgia  2. Migraine headache  3. Cervical spondylosis  Overall, the patient is doing relatively well with her underlying pain. A prescription was written for the Lyrica, the patient will follow-up in 6 months. In the future, we may try going up slightly on the Lyrica taking 50 mg in the morning and 25 mg in the evening to see if this helps some of her pain.  Mary Palau MD 10/28/2016 10:14 AM  [facility] [address] [place], [place] [address]  Phone [phone] Fax [phone]

## 2016-10-28 NOTE — Progress Notes (Signed)
Faxed printed/signed rx lyrica to  [id number] - [place], [place] - [address] [phone] (Phone) [phone] (Fax  Received confirmation.

## 2016-12-01 ENCOUNTER — Telehealth: Payer: Self-pay | Admitting: Neurology

## 2016-12-01 MED ORDER — PREGABALIN 25 MG PO CAPS: 25.0000 mg | ORAL_CAPSULE | Freq: Three times a day (TID) | ORAL | 5 refills | Status: DC

## 2016-12-01 NOTE — Telephone Encounter (Signed)
Pt called in today she is having more pain in the left shoulder, down back to the hip and rt side hip down to the leg. Some pain in the hands also. She did not want to increase the lyrica as Dr Lacretia Nicks discussed at the last OV 8/21. But the past 2 days the pain has gotten worse and she is wanting to increase the lyrica now. Please call

## 2016-12-01 NOTE — Telephone Encounter (Signed)
I called the patient. She is having some increased pain. I will go up on the Lyrica taking 25 mg 3 times daily.

## 2016-12-02 NOTE — Telephone Encounter (Signed)
Faxed printed/signed rx [facility] [place], [place] at [phone]. Received fax confirmation.

## 2016-12-30 DIAGNOSIS — H6123 Impacted cerumen, bilateral: Secondary | ICD-10-CM | POA: Diagnosis not present

## 2017-04-15 DIAGNOSIS — M810 Age-related osteoporosis without current pathological fracture: Secondary | ICD-10-CM | POA: Diagnosis not present

## 2017-04-15 DIAGNOSIS — E785 Hyperlipidemia, unspecified: Secondary | ICD-10-CM | POA: Diagnosis not present

## 2017-04-15 DIAGNOSIS — E559 Vitamin D deficiency, unspecified: Secondary | ICD-10-CM | POA: Diagnosis not present

## 2017-04-15 DIAGNOSIS — M545 Low back pain: Secondary | ICD-10-CM | POA: Diagnosis not present

## 2017-04-23 ENCOUNTER — Other Ambulatory Visit: Payer: Self-pay | Admitting: Nurse Practitioner

## 2017-04-23 DIAGNOSIS — M810 Age-related osteoporosis without current pathological fracture: Secondary | ICD-10-CM

## 2017-04-30 ENCOUNTER — Encounter: Payer: Self-pay | Admitting: Adult Health

## 2017-04-30 ENCOUNTER — Encounter (INDEPENDENT_AMBULATORY_CARE_PROVIDER_SITE_OTHER): Payer: Self-pay

## 2017-04-30 ENCOUNTER — Ambulatory Visit: Payer: Medicare Other | Admitting: Adult Health

## 2017-04-30 VITALS — BP 98/68 | HR 63 | Wt 178.6 lb

## 2017-04-30 DIAGNOSIS — G43009 Migraine without aura, not intractable, without status migrainosus: Secondary | ICD-10-CM | POA: Diagnosis not present

## 2017-04-30 DIAGNOSIS — M797 Fibromyalgia: Secondary | ICD-10-CM

## 2017-04-30 NOTE — Progress Notes (Signed)
PATIENT: [name] DOB: [birth year]  REASON FOR VISIT: follow up- fibromyalgia, migraine headaches, cervical spondylosis HISTORY FROM: patient  HISTORY OF PRESENT ILLNESS:  TODAY 04/30/17: [name] is a 77 year old Caucasian female with a history of migraine headaches and fibromyalgia.  Patient states her headaches have improved since starting Lyrica last appointment.  She reports on average one headache per week located in the left temple and characterized as a sharp pain that includes photophobia and  Phonophobia.  Denies nausea and vomiting.  Patient states that she needs to watch what she eats and certain smells make her headache worse.  Reports she continues to take Topamax 75 mg at night which helps decrease headaches and helps with sleep.  Patient states when she does get her headaches, she typically takes Tylenol which helps.  She reports that she did try to take Topamax 1 tab and that also resolved headache.  Patient states her fibromyalgia is under control and has no complaints regarding that today.  She reports she does have occasional burning sensation at her left temporal that can sometimes radiate down to her jaw/cheek.  Patient denies vision changes, slurred speech, facial weakness or numbness and tingling.  At this time patient is content with her migraine and fibromyalgia control.  Patient is being seen today for re-eval.  HISTORY 10/28/16 (WILLIS): [name] is a 77 year old right-handed white female with a history of migraine headache and a history of fibromyalgia. The patient also has some cervical spondylosis and stiffness in the neck and back. The patient has done relatively well using very low-dose Lyrica taking 25 mg twice daily, higher doses resulted in weight gain and difficulty with sleeping at night. The patient takes Topamax 75 mg at night which has helped her headaches, they have gone from 2 or 3 times a week to averaging one a week. The patient occasionally will  take Tylenol when the fibromyalgia pain is more significant, this is usually worsened by weather changes. The patient also notes that caffeine and eating beef will worsen the headache. She does better in the summer months with her fibromyalgia, worse in the winter. She returns to this office for an evaluation.  HISTORY 04/29/16: [name] is a 77 year old female with a history of fibromyalgia, migraine headaches and cervical spondylosis. She returns today for follow-up. She states in the last 2-3 months her headache frequency has increased. She reports that she has 2- 3 headaches a week. Her headaches typically occur on the left side of the head radiating down to the neck. She states that sometimes at night she will have sharp shooting pain. She states that she has intermittent photophobia and phonophobia. She reports that she will occasionally have nausea and vomiting. She states that she knows that weather is a trigger for her migraines. She feels that in the last 2 months weather has been the factor for increasing headaches. She continues on Lyrica 25 mg twice a day for fibromyalgia. She still feels that this is working well. She returns today for an evaluation.  HISTORY 10/08/15: [name] is a 77 year old female with a history of her fibromyalgia, migraine headaches and cervical spondylosis. She returns today for follow-up. She states that she's been doing yoga at the [facility]. She states that she now can touch her chin to each shoulder. She states before she had limited range of motion in the neck. She reports that her headaches have been under good control. She has approximately 1 headache a week. When she  does have a headache it is normally located on the left temporal region. She feels that food --specifically beef--is a trigger for her migraines. She is able to take Excedrin Migraine and lay down with good relief. She remains on Topamax 50 mg. She is also on Lyrica 25 mg twice a day. She reports that  things are going well. She returns today for an evaluation.  REVIEW OF SYSTEMS: Out of a complete 14 system review of symptoms, the patient complains only of the following symptoms, and all other reviewed systems are negative.  Chills, runny nose, blurred vision, cold intolerance, abdominal pain, diarrhea, daytime sleepiness, aching muscles, neck pain, neck stiffness, and headache  ALLERGIES: Allergies  Allergen Reactions  . Aspirin Anaphylaxis and Hives  . Elavil [Amitriptyline] Anaphylaxis  . Latex   . Relafen [Nabumetone] Hives    HOME MEDICATIONS: Outpatient Medications Prior to Visit  Medication Sig Dispense Refill  . acetaminophen (TYLENOL) 500 MG tablet Take 1,000 mg by mouth every 6 (six) hours as needed.    Marland Kitchen. aspirin-acetaminophen-caffeine (EXCEDRIN MIGRAINE) 250-250-65 MG per tablet Take by mouth every 6 (six) hours as needed for headache.    . Calcium Carbonate-Vit D-Min (CALCIUM 1200 PO) Take 1 capsule by mouth daily.    . cetirizine (ZYRTEC) 10 MG tablet Take 10 mg by mouth daily.    . Cholecalciferol (VITAMIN D-3) 1000 UNITS CAPS Take 2 capsules by mouth daily.    . clobetasol cream (TEMOVATE) 0.05 % APPLY TO AFFECTED AREAS ON THE SKIN TWICE DAILY FOR 2 WEEKS  0  . clopidogrel (PLAVIX) 75 MG tablet Take 1 tablet (75 mg total) by mouth daily with breakfast. 30 tablet 1  . fluticasone (FLONASE) 50 MCG/ACT nasal spray Place into both nostrils daily as needed for allergies or rhinitis.    . Omega-3 Fatty Acids (FISH OIL PO) Take 1 capsule by mouth daily.    . pregabalin (LYRICA) 25 MG capsule Take 1 capsule (25 mg total) by mouth 3 (three) times daily. 90 capsule 5  . topiramate (TOPAMAX) 25 MG tablet Take 3 tablets (75 mg total) by mouth at bedtime. 270 tablet 4  . VITAMIN E EX Take 180 mg by mouth daily.    . clotrimazole-betamethasone (LOTRISONE) cream APPLY TO AFFECTED AREA TWICE A DAY FOR 14 DAYS  1  . Multiple Vitamins-Minerals (CENTRUM SILVER 50+WOMEN PO) Take by  mouth. Takes one daily    . Multiple Vitamins-Minerals (HAIR/SKIN/NAILS) TABS Take 1 tablet by mouth daily.     No facility-administered medications prior to visit.     PAST MEDICAL HISTORY: Past Medical History:  Diagnosis Date  . Abnormality of gait 04/10/2015  . Cervical spondylosis without myelopathy 07/18/2013  . Common migraine with intractable migraine 04/10/2015  . DJD (degenerative joint disease)   . Fibromyalgia   . Hiatal hernia   . Migraine headache     PAST SURGICAL HISTORY: Past Surgical History:  Procedure Laterality Date  . ABDOMINAL HYSTERECTOMY    . DILATION AND CURETTAGE OF UTERUS    . HIATAL HERNIA REPAIR    . TONSILLECTOMY      FAMILY HISTORY: Family History  Problem Relation Age of Onset  . CVA Mother   . Stroke Father   . Emphysema Father     SOCIAL HISTORY: Social History   Socioeconomic History  . Marital status: Widowed    Spouse name: Not on file  . Number of children: 5  . Years of education: college  . Highest education level:  Not on file  Social Needs  . Financial resource strain: Not on file  . Food insecurity - worry: Not on file  . Food insecurity - inability: Not on file  . Transportation needs - medical: Not on file  . Transportation needs - non-medical: Not on file  Occupational History  . Occupation: Retired  Tobacco Use  . Smoking status: Never Smoker  . Smokeless tobacco: Never Used  Substance and Sexual Activity  . Alcohol use: No    Comment: very occasional  . Drug use: No  . Sexual activity: No  Other Topics Concern  . Not on file  Social History Narrative   Patient is married with 5 children.   Patient is right handed.   Patient has college education.   Patient drinks 3 cups daily.            PHYSICAL EXAM  Vitals:   04/30/17 1123  BP: 98/68  Pulse: 63  Weight: 178 lb 9.6 oz (81 kg)   Body mass index is 31.64 kg/m.  Generalized: Well developed, in no acute distress   Neurological examination    Mentation: Alert oriented to time, place, history taking. Follows all commands speech and language fluent Cranial nerve II-XII: Pupils were equal round reactive to light. Extraocular movements were full, visual field were full on confrontational test. Facial sensation and strength were normal. Uvula tongue midline. Head turning and shoulder shrug  were normal and symmetric. Motor: The motor testing reveals 5 over 5 strength of all 4 extremities. Good symmetric motor tone is noted throughout.  Sensory: Sensory testing is intact to soft touch on all 4 extremities. No evidence of extinction is noted.  Coordination: Cerebellar testing reveals good finger-nose-finger and heel-to-shin bilaterally.  Gait and station: Gait is normal.  Moderate difficulty with tandem gait. Reflexes: Deep tendon reflexes are symmetric and normal bilaterally.   DIAGNOSTIC DATA (LABS, IMAGING, TESTING) - I reviewed patient records, labs, notes, testing and imaging myself where available.  Lab Results  Component Value Date   WBC 4.9 05/30/2013   HGB 13.6 05/30/2013   HCT 40.6 05/30/2013   MCV 91.2 05/30/2013   PLT 175 05/30/2013      Component Value Date/Time   NA 144 05/30/2013 0629   K 4.4 05/30/2013 0629   CL 107 05/30/2013 0629   CO2 28 05/30/2013 0629   GLUCOSE 95 05/30/2013 0629   BUN 16 05/30/2013 0629   CREATININE 0.79 05/30/2013 0629   CALCIUM 9.1 05/30/2013 0629   PROT 6.6 05/29/2013 1729   ALBUMIN 3.8 05/29/2013 1729   AST 13 05/29/2013 1729   ALT 9 05/29/2013 1729   ALKPHOS 72 05/29/2013 1729   BILITOT 0.2 (L) 05/29/2013 1729   GFRNONAA 82 (L) 05/30/2013 0629   GFRAA >90 05/30/2013 0629   Lab Results  Component Value Date   CHOL 138 05/30/2013   HDL 53 05/30/2013   LDLCALC 68 05/30/2013   TRIG 87 05/30/2013   CHOLHDL 2.6 05/30/2013   Lab Results  Component Value Date   HGBA1C 6.0 (H) 05/29/2013   Lab Results  Component Value Date   VITAMINB12 407 01/10/2008       ASSESSMENT  AND PLAN 77 y.o. year old female  has a past medical history of Abnormality of gait (04/10/2015), Cervical spondylosis without myelopathy (07/18/2013), Common migraine with intractable migraine (04/10/2015), DJD (degenerative joint disease), Fibromyalgia, Hiatal hernia, and Migraine headache. here with:  1. Fibromyalgia 2. Migraine headache  Overall, patient is doing well with  one headache per week.  Patient continues to take Lyrica and Topamax which she is tolerating without side effects.  Patient does state that she has had slight weight gain on Lyrica but does continue to take it.  She reports she does not want to increase medication at this time for migraines or left temporal nerve pain.  Patient understands to call the office with increasing symptoms or new symptoms.  Reviewed stroke symptoms with patient and educated patient importance of immediate 911 call with any strokelike symptoms.  Patient verbalized understanding.  Patient will follow-up in 6 months with Aundra Millet, NP.   George Hugh NP evaluated patient today. I agree with assessment and plan. I also spoke to the patient and reviewed plan of care.    Butch Penny, MSN, NP-C 04/30/2017, 3:03 PM [facility] [address] [place], [place] [address] [phone]

## 2017-04-30 NOTE — Patient Instructions (Addendum)
Your Plan:  Continue lyrica and topamax for headache prevention Continue to take tylenol as needed for headache Follow up in 6 months or call earlier if needed for headache, fibromyalgia or increase in facial nerve pain   Thank you for coming to see [facility] at [facility]. I hope we have been able to provide you high quality care today.  You may receive a patient satisfaction survey over the next few weeks. We would appreciate your feedback and comments so that we may continue to improve ourselves and the health of our patients.

## 2017-04-30 NOTE — Progress Notes (Signed)
I have read the note, and I agree with the clinical assessment and plan.  Jabbar Palmero K Ulis Kaps   

## 2017-05-04 DIAGNOSIS — Z1231 Encounter for screening mammogram for malignant neoplasm of breast: Secondary | ICD-10-CM | POA: Diagnosis not present

## 2017-07-06 ENCOUNTER — Other Ambulatory Visit: Payer: Self-pay | Admitting: Neurology

## 2017-07-06 MED ORDER — PREGABALIN 25 MG PO CAPS: 25.0000 mg | ORAL_CAPSULE | Freq: Three times a day (TID) | ORAL | 5 refills | Status: DC

## 2017-07-06 NOTE — Telephone Encounter (Signed)
Pt requesting a refill for pregabalin (LYRICA) 25 MG capsule sent to [facility]. Stating that the prescription has expired.

## 2017-07-12 ENCOUNTER — Other Ambulatory Visit: Payer: Self-pay | Admitting: Adult Health

## 2017-08-26 ENCOUNTER — Other Ambulatory Visit: Payer: Self-pay | Admitting: Nurse Practitioner

## 2017-08-26 ENCOUNTER — Ambulatory Visit
Admission: RE | Admit: 2017-08-26 | Discharge: 2017-08-26 | Disposition: A | Discharge status: Home / Self Care | Payer: Medicare Other | Source: Ambulatory Visit | Attending: Nurse Practitioner | Admitting: Nurse Practitioner

## 2017-08-26 DIAGNOSIS — M25552 Pain in left hip: Secondary | ICD-10-CM

## 2017-08-26 DIAGNOSIS — M1612 Unilateral primary osteoarthritis, left hip: Secondary | ICD-10-CM | POA: Diagnosis not present

## 2017-08-26 DIAGNOSIS — R609 Edema, unspecified: Secondary | ICD-10-CM | POA: Diagnosis not present

## 2017-08-26 DIAGNOSIS — M545 Low back pain: Secondary | ICD-10-CM | POA: Diagnosis not present

## 2017-09-08 ENCOUNTER — Other Ambulatory Visit: Payer: Self-pay | Admitting: Nurse Practitioner

## 2017-09-08 DIAGNOSIS — R609 Edema, unspecified: Secondary | ICD-10-CM

## 2017-11-03 ENCOUNTER — Encounter: Payer: Self-pay | Admitting: Adult Health

## 2017-11-03 ENCOUNTER — Ambulatory Visit: Payer: Medicare Other | Admitting: Adult Health

## 2017-11-03 ENCOUNTER — Telehealth: Payer: Self-pay | Admitting: *Deleted

## 2017-11-03 VITALS — BP 109/66 | HR 69 | Ht 63.0 in | Wt 173.6 lb

## 2017-11-03 DIAGNOSIS — M797 Fibromyalgia: Secondary | ICD-10-CM

## 2017-11-03 DIAGNOSIS — G43009 Migraine without aura, not intractable, without status migrainosus: Secondary | ICD-10-CM | POA: Diagnosis not present

## 2017-11-03 MED ORDER — PREGABALIN 25 MG PO CAPS: 25.0000 mg | ORAL_CAPSULE | Freq: Three times a day (TID) | ORAL | 5 refills | Status: DC

## 2017-11-03 NOTE — Patient Instructions (Addendum)
Your Plan:  Continue Lyrica and Topamax If your symptoms worsen or you develop new symptoms please let us know.   Thank you for coming to see us at [facility]. I hope we have been able to provide you high quality care today.  You may receive a patient satisfaction survey over the next few weeks. We would appreciate your feedback and comments so that we may continue to improve ourselves and the health of our patients.

## 2017-11-03 NOTE — Progress Notes (Signed)
I have read the note, and I agree with the clinical assessment and plan.  Charles K Willis   

## 2017-11-03 NOTE — Telephone Encounter (Signed)
I spoke to pt and rescheduled her at 1300 today.  She was seen.

## 2017-11-03 NOTE — Progress Notes (Signed)
PATIENT: [name] DOB: 21-Jun-1940  REASON FOR VISIT: follow up HISTORY FROM: patient  HISTORY OF PRESENT ILLNESS: Today 11/03/17: [name] is a 77 year old female with a history of migraine headaches and fibromyalgia.  She returns today for follow-up.  She reports that her headaches are doing well.  She continues on Lyrica and Topamax.  She states that she had 3 headaches last month.  Her headaches are due to allergies or weather changes for the patient.  She reports that there was only one occasion that she actually had nausea and vomiting.  She denies photophobia and phonophobia.  Patient reports in regards to fibromyalgia her symptoms are fairly controlled.  She states that she does have generalized pain and is unable to walk long distances without resting.  She returns today for evaluation.  HISTORY 04/30/17: [name] is a 77 year old Caucasian female with a history of migraine headaches and fibromyalgia.  Patient states her headaches have improved since starting Lyrica last appointment.  She reports on average one headache per week located in the left temple and characterized as a sharp pain that includes photophobia and  Phonophobia.  Denies nausea and vomiting.  Patient states that she needs to watch what she eats and certain smells make her headache worse.  Reports she continues to take Topamax 75 mg at night which helps decrease headaches and helps with sleep.  Patient states when she does get her headaches, she typically takes Tylenol which helps.  She reports that she did try to take Topamax 1 tab and that also resolved headache.  Patient states her fibromyalgia is under control and has no complaints regarding that today.  She reports she does have occasional burning sensation at her left temporal that can sometimes radiate down to her jaw/cheek.  Patient denies vision changes, slurred speech, facial weakness or numbness and tingling.  At this time patient is content with her  migraine and fibromyalgia control.  Patient is being seen today for re-eval.   REVIEW OF SYSTEMS: Out of a complete 14 system review of symptoms, the patient complains only of the following symptoms, and all other reviewed systems are negative.  See HPI  ALLERGIES: Allergies  Allergen Reactions  . Aspirin Anaphylaxis and Hives  . Elavil [Amitriptyline] Anaphylaxis  . Latex   . Relafen [Nabumetone] Hives    HOME MEDICATIONS: Outpatient Medications Prior to Visit  Medication Sig Dispense Refill  . acetaminophen (TYLENOL) 500 MG tablet Take 1,000 mg by mouth every 6 (six) hours as needed.    Marland Kitchen aspirin-acetaminophen-caffeine (EXCEDRIN MIGRAINE) 250-250-65 MG per tablet Take by mouth every 6 (six) hours as needed for headache.    . Calcium Carbonate-Vit D-Min (CALCIUM 1200 PO) Take 1 capsule by mouth daily.    . cetirizine (ZYRTEC) 10 MG tablet Take 10 mg by mouth daily.    . Cholecalciferol (VITAMIN D) 2000 units tablet Take 2,000 Units by mouth daily.    . clopidogrel (PLAVIX) 75 MG tablet Take 1 tablet (75 mg total) by mouth daily with breakfast. 30 tablet 1  . fluticasone (FLONASE) 50 MCG/ACT nasal spray Place into both nostrils daily as needed for allergies or rhinitis.    Marland Kitchen LYRICA 25 MG capsule TAKE 1 CAPSULE BY MOUTH THREE TIMES A DAY 90 capsule 5  . Multiple Vitamins-Minerals (CENTRUM SILVER 50+WOMEN PO) Take by mouth. Takes one daily    . Multiple Vitamins-Minerals (HAIR/SKIN/NAILS) TABS Take 1 tablet by mouth daily.    . Omega-3 Fatty Acids (FISH  OIL PO) Take 1 capsule by mouth daily.    . pregabalin (LYRICA) 25 MG capsule Take 1 capsule (25 mg total) by mouth 3 (three) times daily. 90 capsule 5  . sertraline (ZOLOFT) 25 MG tablet TAKE 1 TABLET BY MOUTH EVERY DAY FOR DEPRESSION  0  . topiramate (TOPAMAX) 25 MG tablet TAKE 3 TABLETS (75 MG TOTAL) BY MOUTH AT BEDTIME. 270 tablet 4  . vitamin C (ASCORBIC ACID) 500 MG tablet Take 500 mg by mouth daily.    Marland Kitchen. VITAMIN E EX Take 180 mg  by mouth daily.    . Cholecalciferol (VITAMIN D-3) 1000 UNITS CAPS Take 2 capsules by mouth daily.    . clobetasol cream (TEMOVATE) 0.05 % APPLY TO AFFECTED AREAS ON THE SKIN TWICE DAILY FOR 2 WEEKS  0  . clotrimazole-betamethasone (LOTRISONE) cream APPLY TO AFFECTED AREA TWICE A DAY FOR 14 DAYS  1   No facility-administered medications prior to visit.     PAST MEDICAL HISTORY: Past Medical History:  Diagnosis Date  . Abnormality of gait 04/10/2015  . Cervical spondylosis without myelopathy 07/18/2013  . Common migraine with intractable migraine 04/10/2015  . DJD (degenerative joint disease)   . Fibromyalgia   . Hiatal hernia   . Migraine headache     PAST SURGICAL HISTORY: Past Surgical History:  Procedure Laterality Date  . ABDOMINAL HYSTERECTOMY    . DILATION AND CURETTAGE OF UTERUS    . HIATAL HERNIA REPAIR    . TONSILLECTOMY      FAMILY HISTORY: Family History  Problem Relation Age of Onset  . CVA Mother   . Stroke Father   . Emphysema Father     SOCIAL HISTORY: Social History   Socioeconomic History  . Marital status: Widowed    Spouse name: Not on file  . Number of children: 5  . Years of education: college  . Highest education level: Not on file  Occupational History  . Occupation: Retired  Engineer, productionocial Needs  . Financial resource strain: Not on file  . Food insecurity:    Worry: Not on file    Inability: Not on file  . Transportation needs:    Medical: Not on file    Non-medical: Not on file  Tobacco Use  . Smoking status: Never Smoker  . Smokeless tobacco: Never Used  Substance and Sexual Activity  . Alcohol use: No    Comment: very occasional  . Drug use: No  . Sexual activity: Never  Lifestyle  . Physical activity:    Days per week: Not on file    Minutes per session: Not on file  . Stress: Not on file  Relationships  . Social connections:    Talks on phone: Not on file    Gets together: Not on file    Attends religious service: Not on file      Active member of club or organization: Not on file    Attends meetings of clubs or organizations: Not on file    Relationship status: Not on file  . Intimate partner violence:    Fear of current or ex partner: Not on file    Emotionally abused: Not on file    Physically abused: Not on file    Forced sexual activity: Not on file  Other Topics Concern  . Not on file  Social History Narrative   Patient is married with 5 children.   Patient is right handed.   Patient has college education.   Patient drinks  3 cups daily.            PHYSICAL EXAM  Vitals:   11/03/17 1249  BP: 109/66  Pulse: 69  Weight: 173 lb 9.6 oz (78.7 kg)  Height: 5\' 3"  (1.6 m)   Body mass index is 30.75 kg/m.  Generalized: Well developed, in no acute distress   Neurological examination  Mentation: Alert oriented to time, place, history taking. Follows all commands speech and language fluent Cranial nerve II-XII: Pupils were equal round reactive to light. Extraocular movements were full, visual field were full on confrontational test. Facial sensation and strength were normal. Uvula tongue midline. Head turning and shoulder shrug  were normal and symmetric. Motor: The motor testing reveals 5 over 5 strength of all 4 extremities. Good symmetric motor tone is noted throughout.  Sensory: Sensory testing is intact to soft touch on all 4 extremities. No evidence of extinction is noted.  Coordination: Cerebellar testing reveals good finger-nose-finger and heel-to-shin bilaterally.  Gait and station: Gait is normal.  Tandem gait unsteady.  Romberg is negative Reflexes: Deep tendon reflexes are symmetric and normal bilaterally.   DIAGNOSTIC DATA (LABS, IMAGING, TESTING) - I reviewed patient records, labs, notes, testing and imaging myself where available.  Lab Results  Component Value Date   WBC 4.9 05/30/2013   HGB 13.6 05/30/2013   HCT 40.6 05/30/2013   MCV 91.2 05/30/2013   PLT 175 05/30/2013       Component Value Date/Time   NA 144 05/30/2013 0629   K 4.4 05/30/2013 0629   CL 107 05/30/2013 0629   CO2 28 05/30/2013 0629   GLUCOSE 95 05/30/2013 0629   BUN 16 05/30/2013 0629   CREATININE 0.79 05/30/2013 0629   CALCIUM 9.1 05/30/2013 0629   PROT 6.6 05/29/2013 1729   ALBUMIN 3.8 05/29/2013 1729   AST 13 05/29/2013 1729   ALT 9 05/29/2013 1729   ALKPHOS 72 05/29/2013 1729   BILITOT 0.2 (L) 05/29/2013 1729   GFRNONAA 82 (L) 05/30/2013 0629   GFRAA >90 05/30/2013 0629   Lab Results  Component Value Date   CHOL 138 05/30/2013   HDL 53 05/30/2013   LDLCALC 68 05/30/2013   TRIG 87 05/30/2013   CHOLHDL 2.6 05/30/2013   Lab Results  Component Value Date   HGBA1C 6.0 (H) 05/29/2013   Lab Results  Component Value Date   VITAMINB12 407 01/10/2008       ASSESSMENT AND PLAN 77 y.o. year old female  has a past medical history of Abnormality of gait (04/10/2015), Cervical spondylosis without myelopathy (07/18/2013), Common migraine with intractable migraine (04/10/2015), DJD (degenerative joint disease), Fibromyalgia, Hiatal hernia, and Migraine headache. here with :  1.  Fibromyalgia 2.  Migraine headaches  Overall the patient is doing well.  She will continue on Lyrica and Topamax.  We did fill out for handicap sticker today.  She is advised that if her symptoms worsen or she develops new symptoms she should let us know.  She will follow-up in 6 months or sooner if needed.     Butch Penny, MSN, NP-C 11/03/2017, 12:56 PM [facility] [address] [place], [place] [address] [phone]

## 2017-11-03 NOTE — Telephone Encounter (Signed)
Patient came in this morning for her appointment.  She did not get the message that Aundra MilletMegan was sick.  She wants to reschedule but the next available in in May and she does not feel she can wait until then.  Please call.

## 2017-11-11 DIAGNOSIS — Z1331 Encounter for screening for depression: Secondary | ICD-10-CM

## 2017-11-11 DIAGNOSIS — F329 Major depressive disorder, single episode, unspecified: Secondary | ICD-10-CM

## 2018-01-08 ENCOUNTER — Telehealth: Payer: Self-pay | Admitting: *Deleted

## 2018-01-08 NOTE — Telephone Encounter (Signed)
Pt received Flu vaccine 01/06/2018.

## 2018-02-10 ENCOUNTER — Ambulatory Visit: Payer: Medicare Other | Admitting: Nurse Practitioner

## 2018-02-11 ENCOUNTER — Ambulatory Visit (INDEPENDENT_AMBULATORY_CARE_PROVIDER_SITE_OTHER): Payer: Medicare Other | Admitting: Nurse Practitioner

## 2018-02-11 ENCOUNTER — Encounter: Payer: Self-pay | Admitting: Nurse Practitioner

## 2018-02-11 VITALS — BP 118/80 | HR 65 | Temp 98.0°F | Ht 63.5 in | Wt 172.6 lb

## 2018-02-11 DIAGNOSIS — K449 Diaphragmatic hernia without obstruction or gangrene: Secondary | ICD-10-CM | POA: Diagnosis not present

## 2018-02-11 DIAGNOSIS — F329 Major depressive disorder, single episode, unspecified: Secondary | ICD-10-CM | POA: Insufficient documentation

## 2018-02-11 DIAGNOSIS — R7309 Other abnormal glucose: Secondary | ICD-10-CM | POA: Diagnosis not present

## 2018-02-11 DIAGNOSIS — F32A Depression, unspecified: Secondary | ICD-10-CM | POA: Insufficient documentation

## 2018-02-11 DIAGNOSIS — E875 Hyperkalemia: Secondary | ICD-10-CM | POA: Diagnosis not present

## 2018-02-11 DIAGNOSIS — R1310 Dysphagia, unspecified: Secondary | ICD-10-CM | POA: Insufficient documentation

## 2018-02-11 DIAGNOSIS — F3341 Major depressive disorder, recurrent, in partial remission: Secondary | ICD-10-CM

## 2018-02-11 NOTE — Progress Notes (Signed)
4 Subjective:     Patient ID: [name] , female    DOB: 31-Oct-1940 , 77 y.o.   MRN: [id number]   Chief Complaint  Patient presents with  . Depression    patient is currently taking Zoloft but she is not taking it every day.  Marland Kitchen Hernia    Patient states she had an xray done and was told she has a hernia.    HPI  She reports she can hear the cars driving down the road.  She is taking her Zoloft 25 mg not every day.    Depression         This is a chronic problem.  The current episode started more than 1 year ago.   Associated symptoms include insomnia.  Associated symptoms include no fatigue and no headaches.  Past treatments include SSRIs - Selective serotonin reuptake inhibitors.  Previous treatment provided moderate relief.   Pertinent negatives include no hypothyroidism and no anxiety. Insomnia  Primary symptoms: sleep disturbance, difficulty falling asleep.  The current episode started more than one year. The onset quality is gradual. The problem occurs nightly. The problem is unchanged. PMH includes: depression.     Past Medical History:  Diagnosis Date  . Abnormality of gait 04/10/2015  . Cervical spondylosis without myelopathy 07/18/2013  . Common migraine with intractable migraine 04/10/2015  . DJD (degenerative joint disease)   . Fibromyalgia   . Hiatal hernia   . Migraine headache      Family History  Problem Relation Age of Onset  . CVA Mother   . Stroke Father   . Emphysema Father      Current Outpatient Medications:  .  acetaminophen (TYLENOL) 500 MG tablet, Take 1,000 mg by mouth every 6 (six) hours as needed., Disp: , Rfl:  .  Calcium Carbonate-Vit D-Min (CALCIUM 1200 PO), Take 1 capsule by mouth daily., Disp: , Rfl:  .  cetirizine (ZYRTEC) 10 MG tablet, Take 10 mg by mouth daily., Disp: , Rfl:  .  Cholecalciferol (VITAMIN D) 2000 units tablet, Take 2,000 Units by mouth daily., Disp: , Rfl:  .  clopidogrel (PLAVIX) 75 MG tablet, Take 1 tablet (75 mg  total) by mouth daily with breakfast., Disp: 30 tablet, Rfl: 1 .  fluticasone (FLONASE) 50 MCG/ACT nasal spray, Place into both nostrils daily as needed for allergies or rhinitis., Disp: , Rfl:  .  LYRICA 25 MG capsule, TAKE 1 CAPSULE BY MOUTH THREE TIMES A DAY, Disp: 90 capsule, Rfl: 5 .  Omega-3 Fatty Acids (FISH OIL PO), Take 1 capsule by mouth daily., Disp: , Rfl:  .  pregabalin (LYRICA) 25 MG capsule, Take 1 capsule (25 mg total) by mouth 3 (three) times daily., Disp: 90 capsule, Rfl: 5 .  sertraline (ZOLOFT) 25 MG tablet, TAKE 1 TABLET BY MOUTH EVERY DAY FOR DEPRESSION, Disp: , Rfl: 0 .  topiramate (TOPAMAX) 25 MG tablet, TAKE 3 TABLETS (75 MG TOTAL) BY MOUTH AT BEDTIME., Disp: 270 tablet, Rfl: 4 .  vitamin C (ASCORBIC ACID) 500 MG tablet, Take 500 mg by mouth daily., Disp: , Rfl:    Allergies  Allergen Reactions  . Aspirin Anaphylaxis and Hives  . Elavil [Amitriptyline] Anaphylaxis  . Latex   . Relafen [Nabumetone] Hives     Review of Systems  Constitutional: Negative.  Negative for fatigue.  Eyes: Negative for visual disturbance.  Respiratory: Negative.  Negative for shortness of breath.   Cardiovascular: Negative.  Negative for chest pain, palpitations and leg  swelling.  Gastrointestinal: Negative.  Negative for abdominal distention, abdominal pain and nausea.       Dysphagia  Endocrine: Negative.   Musculoskeletal: Negative.   Skin: Negative.   Neurological: Negative for dizziness, weakness and headaches.  Psychiatric/Behavioral: Positive for depression and sleep disturbance. Negative for confusion. The patient has insomnia. The patient is not nervous/anxious.      Today's Vitals   02/11/18 1119  BP: 118/80  Pulse: 65  Temp: 98 F (36.7 C)  TempSrc: Oral  SpO2: 95%  Weight: 172 lb 9.6 oz (78.3 kg)  Height: 5' 3.5" (1.613 m)  PainSc: 0-No pain   Body mass index is 30.1 kg/m.   Objective:  Physical Exam  Constitutional: She is oriented to person, place, and  time. She appears well-developed and well-nourished.  Eyes: Pupils are equal, round, and reactive to light.  Cardiovascular: Normal rate, regular rhythm, normal heart sounds and intact distal pulses.  No murmur heard. Pulmonary/Chest: Effort normal and breath sounds normal.  Abdominal: Soft. Bowel sounds are normal.  Musculoskeletal: Normal range of motion. She exhibits no edema.  Neurological: She is alert and oriented to person, place, and time. No cranial nerve deficit.  Skin: Skin is warm and dry. Capillary refill takes less than 2 seconds.  Psychiatric: She has a normal mood and affect. Her behavior is normal. Judgment and thought content normal.  Vitals reviewed.       Assessment And Plan:    1. Recurrent major depressive disorder, in partial remission ([employer])  Chronic  Doing better with Zoloft however she is taking every other day, she is to take a 1/2 tablet daily to avoid adverse reaction or worsening depression  PHQ2 - 0  2. Hiatal hernia  On her CT scan from 09/2016 by Dr. Collene Mares she has a moderate hiatal hernia. The patient states she has had another xray recently however I do not have those results.   3. Dysphagia, unspecified type  Reports intermittent episodes of dysphagia and full feeling to upper stomach  Advised to contact Dr. Collene Mares for further evaluation as she may need another barium swallow or referral to surgeon for hernia repair  4. Hyperkalemia  She had an elevated potassium at her last visit of 5.9  Admits to eating increased oranges and bananas, advised to decrease this intake.   - BMP8+eGFR  5. Abnormal glucose  HgbA1c at last visit was 5.9  Will recheck levels today  Encouraged to limit intake of sugary foods and drinks. - Hemoglobin A1c  [name], FNP

## 2018-02-12 LAB — BMP8+EGFR
BUN/Creatinine Ratio: 21 (ref 12–28)
BUN: 17 mg/dL (ref 8–27)
CO2: 23 mmol/L (ref 20–29)
CREATININE: 0.82 mg/dL (ref 0.57–1.00)
Calcium: 9.8 mg/dL (ref 8.7–10.3)
Chloride: 103 mmol/L (ref 96–106)
GFR calc Af Amer: 80 mL/min/{1.73_m2} (ref >59)
GFR, EST NON AFRICAN AMERICAN: 70 mL/min/{1.73_m2} (ref >59)
GLUCOSE: 86 mg/dL (ref 65–99)
Potassium: 4.2 mmol/L (ref 3.5–5.2)
SODIUM: 139 mmol/L (ref 134–144)

## 2018-02-12 LAB — HEMOGLOBIN A1C
Est. average glucose Bld gHb Est-mCnc: 111 mg/dL
Hgb A1c MFr Bld: 5.5 % (ref 4.8–5.6)

## 2018-02-15 ENCOUNTER — Other Ambulatory Visit: Payer: Self-pay | Admitting: Adult Health

## 2018-02-15 NOTE — Telephone Encounter (Signed)
[facility] Verified LR: 12-26-17 Qty:90 Pending appt: 05-13-2018

## 2018-03-28 ENCOUNTER — Other Ambulatory Visit: Payer: Self-pay | Admitting: Nurse Practitioner

## 2018-05-02 ENCOUNTER — Other Ambulatory Visit: Payer: Self-pay | Admitting: Internal Medicine

## 2018-05-10 DIAGNOSIS — Z1231 Encounter for screening mammogram for malignant neoplasm of breast: Secondary | ICD-10-CM | POA: Diagnosis not present

## 2018-05-10 LAB — HM MAMMOGRAPHY

## 2018-05-13 ENCOUNTER — Encounter: Payer: Self-pay | Admitting: Neurology

## 2018-05-13 ENCOUNTER — Ambulatory Visit: Payer: Medicare Other | Admitting: Neurology

## 2018-05-13 VITALS — BP 124/76 | HR 60 | Ht 63.5 in | Wt 169.2 lb

## 2018-05-13 DIAGNOSIS — M797 Fibromyalgia: Secondary | ICD-10-CM | POA: Diagnosis not present

## 2018-05-13 DIAGNOSIS — R269 Unspecified abnormalities of gait and mobility: Secondary | ICD-10-CM | POA: Diagnosis not present

## 2018-05-13 DIAGNOSIS — G43019 Migraine without aura, intractable, without status migrainosus: Secondary | ICD-10-CM

## 2018-05-13 MED ORDER — PREGABALIN 25 MG PO CAPS: ORAL_CAPSULE | ORAL | 1 refills | Status: DC

## 2018-05-13 MED ORDER — TOPIRAMATE 25 MG PO TABS: ORAL_TABLET | ORAL | 2 refills | Status: DC

## 2018-05-13 NOTE — Progress Notes (Signed)
Reason for visit: Fibromyalgia, migraine  [name] is an 78 y.o. female  History of present illness:  [name] is a 78 year old right-handed white female with a history of fibromyalgia.  The patient is on very low-dose Lyrica, she seems to tolerate this fairly well and she is not having excessive amounts of discomfort and pain.  She does have headaches anywhere from 1-3 times a week, she is on Topamax 75 mg at night, she has been on a stable dose for a number of years.  She tolerates the medication well.  She does not exercise much during the winter because of the cold weather.  She does better during the warmer months, she claims that weather changes and exposure to smoke worsens her headache.  Past Medical History:  Diagnosis Date  . Abnormality of gait 04/10/2015  . Cervical spondylosis without myelopathy 07/18/2013  . Common migraine with intractable migraine 04/10/2015  . DJD (degenerative joint disease)   . Fibromyalgia   . Hiatal hernia   . Migraine headache     Past Surgical History:  Procedure Laterality Date  . ABDOMINAL HYSTERECTOMY    . DILATION AND CURETTAGE OF UTERUS    . HIATAL HERNIA REPAIR    . TONSILLECTOMY      Family History  Problem Relation Age of Onset  . CVA Mother   . Stroke Father   . Emphysema Father     Social history:  reports that she has never smoked. She has never used smokeless tobacco. She reports that she does not drink alcohol or use drugs.    Allergies  Allergen Reactions  . Aspirin Anaphylaxis and Hives  . Elavil [Amitriptyline] Anaphylaxis  . Latex   . Relafen [Nabumetone] Hives    Medications:  Prior to Admission medications   Medication Sig Start Date End Date Taking? Authorizing Provider  acetaminophen (TYLENOL) 500 MG tablet Take 1,000 mg by mouth every 6 (six) hours as needed.   Yes [provider]  Calcium Carbonate-Vit D-Min (CALCIUM 1200 PO) Take 1 capsule by mouth daily.   Yes [provider]  cetirizine (ZYRTEC) 10 MG tablet Take 10 mg by mouth daily.   Yes [provider]  Cholecalciferol (VITAMIN D) 2000 units tablet Take 2,000 Units by mouth daily.   Yes [provider]  clopidogrel (PLAVIX) 75 MG tablet TAKE 1 TABLET BY MOUTH EVERY DAY 03/31/18  Yes Arnette Felts, FNP  fluticasone (FLONASE) 50 MCG/ACT nasal spray Place into both nostrils daily as needed for allergies or rhinitis.   Yes [provider]  LYRICA 25 MG capsule TAKE 1 CAPSULE BY MOUTH THREE TIMES A DAY 07/06/17  Yes Butch Penny, NP  Omega-3 Fatty Acids (FISH OIL PO) Take 1 capsule by mouth daily.   Yes [provider]  sertraline (ZOLOFT) 25 MG tablet TAKE 1 TABLET EVERY DAY FOR DEPRESSION 05/03/18  Yes Dorothyann Peng, MD  topiramate (TOPAMAX) 25 MG tablet TAKE 3 TABLETS (75 MG TOTAL) BY MOUTH AT BEDTIME. 07/13/17  Yes York Spaniel, MD  vitamin C (ASCORBIC ACID) 500 MG tablet Take 500 mg by mouth daily.   Yes [provider]    ROS:  Out of a complete 14 system review of symptoms, the patient complains only of the following symptoms, and all other reviewed systems are negative.  Headache  Blood pressure 124/76, pulse 60, height 5' 3.5" (1.613 m), weight 169 lb 4 oz (76.8 kg), SpO2 96 %.  Physical Exam  General: The patient is alert and cooperative at the time of the examination.  Skin: No significant peripheral edema is noted.   Neurologic Exam  Mental status: The patient is alert and oriented x 3 at the time of the examination. The patient has apparent normal recent and remote memory, with an apparently normal attention span and concentration ability.   Cranial nerves: Facial symmetry is present. Speech is normal, no aphasia or dysarthria is noted. Extraocular movements are full. Visual fields are full.  Motor: The patient has good strength in all 4 extremities.  Sensory examination: Soft touch sensation is symmetric on the face, arms, and  legs.  Coordination: The patient has good finger-nose-finger and heel-to-shin bilaterally.  Gait and station: The patient has a normal gait. Tandem gait is slightly unsteady. Romberg is negative, but is unsteady. No drift is seen.  Reflexes: Deep tendon reflexes are symmetric.   Assessment/Plan:  1.  Fibromyalgia  2.  Migraine headache  The patient will go up on the Topamax dose taking 25 mg in the morning and 75 mg in the evening, the prescription was sent in.  A prescription was also sent in for the Lyrica, the patient will follow-up through this office in about 6 months.  Marlan Palau MD 05/13/2018 3:27 PM  [facility] [address] [place], [place] [address]  Phone [phone] Fax [phone]

## 2018-05-13 NOTE — Patient Instructions (Signed)
We will go up on the Topamax to 25 mg in the morning and 75 mg in the evening.

## 2018-06-11 ENCOUNTER — Encounter: Payer: Self-pay | Admitting: Internal Medicine

## 2018-06-17 ENCOUNTER — Encounter: Payer: Self-pay | Admitting: Nurse Practitioner

## 2018-06-17 ENCOUNTER — Ambulatory Visit (INDEPENDENT_AMBULATORY_CARE_PROVIDER_SITE_OTHER): Payer: Medicare Other | Admitting: Nurse Practitioner

## 2018-06-17 ENCOUNTER — Other Ambulatory Visit: Payer: Self-pay

## 2018-06-17 VITALS — BP 128/68 | HR 65 | Temp 97.9°F | Ht 63.4 in | Wt 168.6 lb

## 2018-06-17 DIAGNOSIS — M797 Fibromyalgia: Secondary | ICD-10-CM | POA: Diagnosis not present

## 2018-06-17 DIAGNOSIS — Z8673 Personal history of transient ischemic attack (TIA), and cerebral infarction without residual deficits: Secondary | ICD-10-CM | POA: Insufficient documentation

## 2018-06-17 DIAGNOSIS — F3341 Major depressive disorder, recurrent, in partial remission: Secondary | ICD-10-CM | POA: Insufficient documentation

## 2018-06-17 DIAGNOSIS — J302 Other seasonal allergic rhinitis: Secondary | ICD-10-CM | POA: Insufficient documentation

## 2018-06-17 NOTE — Patient Instructions (Signed)
Allergies, Adult  An allergy means that your body reacts to something that bothers it (allergen). It is not a normal reaction. This can happen from something that you:   Eat.   Breathe in.   Touch.  You can have an allergy (be allergic) to:   Outdoor things, like:  ? Pollen.  ? Grass.  ? Weeds.   Indoor things, like:  ? Dust.  ? Smoke.  ? Pet dander.   Foods.   Medicines.   Things that bother your skin, like:  ? Detergents.  ? Chemicals.  ? Latex.   Perfume.   Bugs.  An allergy cannot spread from person to person (is not contagious).  Follow these instructions at home:               Stay away from things that you know you are allergic to.   If you have allergies to things in the air, wash out your nose each day. Do it with one of these:  ? A salt-water (saline) spray.  ? A container (neti pot).   Take over-the-counter and prescription medicines only as told by your doctor.   Keep all follow-up visits as told by your doctor. This is important.   If you are at risk for a very bad allergy reaction (anaphylaxis), keep an auto-injector with you all the time. This is called an epinephrine injection.  ? This is pre-measured medicine with a needle. You can put it into your skin by yourself.  ? Right after you have a very bad allergy reaction, you or a person with you must give the medicine in less than a few minutes. This is an emergency.   If you have ever had a very bad allergy reaction, wear a medical alert bracelet or necklace. Your very bad allergy should be written on it.  Contact a health care provider if:   Your symptoms do not get better with treatment.  Get help right away if:   You have symptoms of a very bad allergy reaction. These include:  ? A swollen mouth, tongue, or throat.  ? Pain or tightness in your chest.  ? Trouble breathing.  ? Being short of breath.  ? Dizziness.  ? Fainting.  ? Very bad pain in your belly (abdomen).  ? Throwing up (vomiting).  ? Watery poop  (diarrhea).  Summary   An allergy means that your body reacts to something that bothers it (allergen). It is not a normal reaction.   Stay away from things that make your body react.   Take over-the-counter and prescription medicines only as told by your doctor.   If you are at risk for a very bad allergy reaction, carry an auto-injector (epinephrine injection) all the time. Also, wear a medical alert bracelet or necklace so people know about your allergy.  This information is not intended to replace advice given to you by your health care provider. Make sure you discuss any questions you have with your health care provider.  Document Released: 06/21/2012 Document Revised: 06/09/2016 Document Reviewed: 06/09/2016  Elsevier Interactive Patient Education  2019 [employer]

## 2018-06-17 NOTE — Progress Notes (Signed)
Subjective:     Patient ID: [name] , female    DOB: 09-13-1940 , 78 y.o.   MRN: [id number]   Chief Complaint  Patient presents with  . Depression    HPI  Depression         This is a chronic problem.  The current episode started more than 1 year ago.   The onset quality is gradual.   The problem occurs constantly.  Associated symptoms include no decreased concentration, no fatigue and no headaches.     The symptoms are aggravated by nothing.  Past treatments include SSRIs - Selective serotonin reuptake inhibitors.  Previous treatment provided no relief relief.   Pertinent negatives include no chronic fatigue syndrome.    Past Medical History:  Diagnosis Date  . Abnormality of gait 04/10/2015  . Cervical spondylosis without myelopathy 07/18/2013  . Common migraine with intractable migraine 04/10/2015  . DJD (degenerative joint disease)   . Fibromyalgia   . Hiatal hernia   . Migraine headache      Family History  Problem Relation Age of Onset  . CVA Mother   . Stroke Father   . Emphysema Father      Current Outpatient Medications:  .  acetaminophen (TYLENOL) 500 MG tablet, Take 1,000 mg by mouth every 6 (six) hours as needed., Disp: , Rfl:  .  Calcium Carbonate-Vit D-Min (CALCIUM 1200 PO), Take 1 capsule by mouth daily., Disp: , Rfl:  .  cetirizine (ZYRTEC) 10 MG tablet, Take 10 mg by mouth daily., Disp: , Rfl:  .  Cholecalciferol (VITAMIN D) 2000 units tablet, Take 2,000 Units by mouth daily., Disp: , Rfl:  .  clopidogrel (PLAVIX) 75 MG tablet, TAKE 1 TABLET BY MOUTH EVERY DAY, Disp: 90 tablet, Rfl: 1 .  fluticasone (FLONASE) 50 MCG/ACT nasal spray, Place into both nostrils daily as needed for allergies or rhinitis., Disp: , Rfl:  .  Omega-3 Fatty Acids (FISH OIL PO), Take 1 capsule by mouth daily., Disp: , Rfl:  .  pregabalin (LYRICA) 25 MG capsule, TAKE 1 CAPSULE BY MOUTH THREE TIMES A DAY, Disp: 270 capsule, Rfl: 1 .  sertraline (ZOLOFT) 25 MG tablet, TAKE 1 TABLET  EVERY DAY FOR DEPRESSION, Disp: 90 tablet, Rfl: 1 .  topiramate (TOPAMAX) 25 MG tablet, Take one tablet in the morning and 3 in the evening, Disp: 360 tablet, Rfl: 2 .  vitamin C (ASCORBIC ACID) 500 MG tablet, Take 500 mg by mouth daily., Disp: , Rfl:    Allergies  Allergen Reactions  . Aspirin Anaphylaxis and Hives  . Elavil [Amitriptyline] Anaphylaxis  . Latex   . Relafen [Nabumetone] Hives     Review of Systems  Constitutional: Negative for fatigue.  Respiratory: Negative.   Cardiovascular: Negative.  Negative for chest pain, palpitations and leg swelling.  Skin: Negative.   Neurological: Negative for dizziness and headaches.  Psychiatric/Behavioral: Positive for depression. Negative for behavioral problems, decreased concentration and hallucinations. The patient is not nervous/anxious.      Today's Vitals   06/17/18 1043  BP: 128/68  Pulse: 65  Temp: 97.9 F (36.6 C)  TempSrc: Oral  SpO2: 97%  Weight: 168 lb 9.6 oz (76.5 kg)  Height: 5' 3.4" (1.61 m)   Body mass index is 29.49 kg/m.   Objective:  Physical Exam Vitals signs reviewed.  Constitutional:      Appearance: Normal appearance.  Cardiovascular:     Rate and Rhythm: Normal rate and regular rhythm.  Pulses: Normal pulses.     Heart sounds: Normal heart sounds. No murmur.  Pulmonary:     Effort: Pulmonary effort is normal. No respiratory distress.     Breath sounds: Normal breath sounds.  Skin:    General: Skin is warm and dry.     Capillary Refill: Capillary refill takes less than 2 seconds.  Neurological:     General: No focal deficit present.     Mental Status: She is alert and oriented to person, place, and time.         Assessment And Plan:     1. Recurrent major depressive disorder, in partial remission ([facility])  Chronic, stable.  Continue with current medications  2. History of TIA (transient ischemic attack)  She is currently taking Plavix  3. Fibromyalgia  She is doing well  however at times   4. Seasonal allergies  She has stopped taking Zyrtec due to a family member telling her it causes cancer  The claritin is not as effective.   I have given her a sample of Xyzal to try if effective return call to office.      Arnette FeltsJanece Ruffus Kamaka, FNP    THE PATIENT IS ENCOURAGED TO PRACTICE SOCIAL DISTANCING DUE TO THE COVID-19 PANDEMIC.

## 2018-08-18 ENCOUNTER — Telehealth: Payer: Self-pay | Admitting: Internal Medicine

## 2018-08-18 NOTE — Telephone Encounter (Signed)
I called the patient to reschedule her AWV to an earlier date.  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 VDM (DD)

## 2018-08-24 ENCOUNTER — Other Ambulatory Visit: Payer: Self-pay

## 2018-08-24 ENCOUNTER — Ambulatory Visit (INDEPENDENT_AMBULATORY_CARE_PROVIDER_SITE_OTHER): Payer: Medicare Other

## 2018-08-24 VITALS — BP 101/64 | HR 72 | Ht 64.0 in | Wt 165.0 lb

## 2018-08-24 DIAGNOSIS — Z0000 Encounter for general adult medical examination without abnormal findings: Secondary | ICD-10-CM

## 2018-08-24 DIAGNOSIS — Z23 Encounter for immunization: Secondary | ICD-10-CM

## 2018-08-24 MED ORDER — PREVNAR 13 IM SUSP: 0.5000 mL | INTRAMUSCULAR | 0 refills | Status: AC

## 2018-08-24 NOTE — Patient Instructions (Signed)
[name] , Thank you for taking time to come for your Medicare Wellness Visit. I appreciate your ongoing commitment to your health goals. Please review the following plan we discussed and let me know if I can assist you in the future.   Screening recommendations/referrals: Colonoscopy: not required Mammogram: 05/2018 Bone Density: 03/2015 Recommended yearly ophthalmology/optometry visit for glaucoma screening and checkup Recommended yearly dental visit for hygiene and checkup  Vaccinations: Influenza vaccine: 12/2017 Pneumococcal vaccine: sent to pharmacy Tdap vaccine: 03/2013 Shingles vaccine: discussed    Advanced directives: Please bring a copy of your POA (Power of [place]) and/or Living Will to your next appointment.    Conditions/risks identified: overweight  Next appointment: 10/21/2018 at 10:00   Preventive Care 45 Years and Older, Female Preventive care refers to lifestyle choices and visits with your health care provider that can promote health and wellness. What does preventive care include?  A yearly physical exam. This is also called an annual well check.  Dental exams once or twice a year.  Routine eye exams. Ask your health care provider how often you should have your eyes checked.  Personal lifestyle choices, including:  Daily care of your teeth and gums.  Regular physical activity.  Eating a healthy diet.  Avoiding tobacco and drug use.  Limiting alcohol use.  Practicing safe sex.  Taking low-dose aspirin every day.  Taking vitamin and mineral supplements as recommended by your health care provider. What happens during an annual well check? The services and screenings done by your health care provider during your annual well check will depend on your age, overall health, lifestyle risk factors, and family history of disease. Counseling  Your health care provider may ask you questions about your:  Alcohol use.  Tobacco use.  Drug use.   Emotional well-being.  Home and relationship well-being.  Sexual activity.  Eating habits.  History of falls.  Memory and ability to understand (cognition).  Work and work Statistician.  Reproductive health. Screening  You may have the following tests or measurements:  Height, weight, and BMI.  Blood pressure.  Lipid and cholesterol levels. These may be checked every 5 years, or more frequently if you are over 16 years old.  Skin check.  Lung cancer screening. You may have this screening every year starting at age 65 if you have a 30-pack-year history of smoking and currently smoke or have quit within the past 15 years.  Fecal occult blood test (FOBT) of the stool. You may have this test every year starting at age 78.  Flexible sigmoidoscopy or colonoscopy. You may have a sigmoidoscopy every 5 years or a colonoscopy every 10 years starting at age 85.  Hepatitis C blood test.  Hepatitis B blood test.  Sexually transmitted disease (STD) testing.  Diabetes screening. This is done by checking your blood sugar (glucose) after you have not eaten for a while (fasting). You may have this done every 1-3 years.  Bone density scan. This is done to screen for osteoporosis. You may have this done starting at age 39.  Mammogram. This may be done every 1-2 years. Talk to your health care provider about how often you should have regular mammograms. Talk with your health care provider about your test results, treatment options, and if necessary, the need for more tests. Vaccines  Your health care provider may recommend certain vaccines, such as:  Influenza vaccine. This is recommended every year.  Tetanus, diphtheria, and acellular pertussis (Tdap, Td) vaccine. You may need  a Td booster every 10 years.  Zoster vaccine. You may need this after age 1.  Pneumococcal 13-valent conjugate (PCV13) vaccine. One dose is recommended after age 75.  Pneumococcal polysaccharide (PPSV23)  vaccine. One dose is recommended after age 61. Talk to your health care provider about which screenings and vaccines you need and how often you need them. This information is not intended to replace advice given to you by your health care provider. Make sure you discuss any questions you have with your health care provider. Document Released: 03/23/2015 Document Revised: 11/14/2015 Document Reviewed: 12/26/2014 Elsevier Interactive Patient Education  2017 [employer] Prevention in the Home Falls can cause injuries. They can happen to people of all ages. There are many things you can do to make your home safe and to help prevent falls. What can I do on the outside of my home?  Regularly fix the edges of walkways and driveways and fix any cracks.  Remove anything that might make you trip as you walk through a door, such as a raised step or threshold.  Trim any bushes or trees on the path to your home.  Use bright outdoor lighting.  Clear any walking paths of anything that might make someone trip, such as rocks or tools.  Regularly check to see if handrails are loose or broken. Make sure that both sides of any steps have handrails.  Any raised decks and porches should have guardrails on the edges.  Have any leaves, snow, or ice cleared regularly.  Use sand or salt on walking paths during winter.  Clean up any spills in your garage right away. This includes oil or grease spills. What can I do in the bathroom?  Use night lights.  Install grab bars by the toilet and in the tub and shower. Do not use towel bars as grab bars.  Use non-skid mats or decals in the tub or shower.  If you need to sit down in the shower, use a plastic, non-slip stool.  Keep the floor dry. Clean up any water that spills on the floor as soon as it happens.  Remove soap buildup in the tub or shower regularly.  Attach bath mats securely with double-sided non-slip rug tape.  Do not have throw rugs  and other things on the floor that can make you trip. What can I do in the bedroom?  Use night lights.  Make sure that you have a light by your bed that is easy to reach.  Do not use any sheets or blankets that are too big for your bed. They should not hang down onto the floor.  Have a firm chair that has side arms. You can use this for support while you get dressed.  Do not have throw rugs and other things on the floor that can make you trip. What can I do in the kitchen?  Clean up any spills right away.  Avoid walking on wet floors.  Keep items that you use a lot in easy-to-reach places.  If you need to reach something above you, use a strong step stool that has a grab bar.  Keep electrical cords out of the way.  Do not use floor polish or wax that makes floors slippery. If you must use wax, use non-skid floor wax.  Do not have throw rugs and other things on the floor that can make you trip. What can I do with my stairs?  Do not leave any items on the  stairs.  Make sure that there are handrails on both sides of the stairs and use them. Fix handrails that are broken or loose. Make sure that handrails are as long as the stairways.  Check any carpeting to make sure that it is firmly attached to the stairs. Fix any carpet that is loose or worn.  Avoid having throw rugs at the top or bottom of the stairs. If you do have throw rugs, attach them to the floor with carpet tape.  Make sure that you have a light switch at the top of the stairs and the bottom of the stairs. If you do not have them, ask someone to add them for you. What else can I do to help prevent falls?  Wear shoes that:  Do not have high heels.  Have rubber bottoms.  Are comfortable and fit you well.  Are closed at the toe. Do not wear sandals.  If you use a stepladder:  Make sure that it is fully opened. Do not climb a closed stepladder.  Make sure that both sides of the stepladder are locked into  place.  Ask someone to hold it for you, if possible.  Clearly mark and make sure that you can see:  Any grab bars or handrails.  First and last steps.  Where the edge of each step is.  Use tools that help you move around (mobility aids) if they are needed. These include:  Canes.  Walkers.  Scooters.  Crutches.  Turn on the lights when you go into a dark area. Replace any light bulbs as soon as they burn out.  Set up your furniture so you have a clear path. Avoid moving your furniture around.  If any of your floors are uneven, fix them.  If there are any pets around you, be aware of where they are.  Review your medicines with your doctor. Some medicines can make you feel dizzy. This can increase your chance of falling. Ask your doctor what other things that you can do to help prevent falls. This information is not intended to replace advice given to you by your health care provider. Make sure you discuss any questions you have with your health care provider. Document Released: 12/21/2008 Document Revised: 08/02/2015 Document Reviewed: 03/31/2014 Elsevier Interactive Patient Education  2017 [employer].

## 2018-08-24 NOTE — Progress Notes (Signed)
Subjective:   [name] is a 78 y.o. female who presents for Medicare Annual (Subsequent) preventive examination.  This visit type was conducted due to national recommendations for restrictions regarding the COVID-19 Pandemic (e.g. social distancing). This format is felt to be most appropriate for this patient at this time. All issues noted in this document were discussed and addressed. No physical exam was performed (except for noted visual exam findings with Video Visits). This patient, [name], has given permission to perform this visit via telephone. Vital signs may be absent or patient reported.  Patient location:  At home  Nurse location:  [facility]     Review of Systems:  n/a Cardiac Risk Factors include: advanced age (>10men, >37 women);sedentary lifestyle     Objective:     Vitals: BP 101/64 Comment: per patient  Pulse 72 Comment: per patient  Ht 5\' 4"  (1.626 m) Comment: per patient  Wt 165 lb (74.8 kg) Comment: per patient  BMI 28.32 kg/m   Body mass index is 28.32 kg/m.  Advanced Directives 08/24/2018 04/10/2015 11/20/2014 05/18/2014 05/29/2013  Does Patient Have a Medical Advance Directive? Yes Yes No No Patient does not have advance directive  Type of Paramedic of [place];Living will - - -  Copy of [place] in Chart? No - copy requested - - - -  Would patient like information on creating a medical advance directive? - - - Yes - Educational materials given -  Pre-existing out of facility DNR order (yellow form or pink MOST form) - - - - No    Tobacco Social History   Tobacco Use  Smoking Status Never Smoker  Smokeless Tobacco Never Used     Counseling given: Not Answered   Clinical Intake:  Pre-visit preparation completed: Yes  Pain : 0-10 Pain Score: 5  Pain Type: Chronic pain Pain Location: Shoulder Pain Orientation: Right, Left Pain Radiating Towards: back  Pain Descriptors / Indicators: Aching Pain Onset: More than a month ago Pain Frequency: Intermittent Pain Relieving Factors: lyrica and tylenol helps some and rest  Pain Relieving Factors: lyrica and tylenol helps some and rest  Nutritional Status: BMI 25 -29 Overweight Nutritional Risks: None Diabetes: No  How often do you need to have someone help you when you read instructions, pamphlets, or other written materials from your doctor or pharmacy?: 1 - Never What is the last grade level you completed in school?: some college  Interpreter Needed?: No  Information entered by :: NAllen LPN  Past Medical History:  Diagnosis Date  . Abnormality of gait 04/10/2015  . Cervical spondylosis without myelopathy 07/18/2013  . Common migraine with intractable migraine 04/10/2015  . DJD (degenerative joint disease)   . Fibromyalgia   . Hiatal hernia   . Migraine headache    Past Surgical History:  Procedure Laterality Date  . ABDOMINAL HYSTERECTOMY    . DILATION AND CURETTAGE OF UTERUS    . HIATAL HERNIA REPAIR    . TONSILLECTOMY     Family History  Problem Relation Age of Onset  . CVA Mother   . Stroke Father   . Emphysema Father    Social History   Socioeconomic History  . Marital status: Widowed    Spouse name: Not on file  . Number of children: 5  . Years of education: college  . Highest education level: Not on file  Occupational History  . Occupation: Retired  Scientific laboratory technician  .  Financial resource strain: Not hard at all  . Food insecurity    Worry: Never true    Inability: Never true  . Transportation needs    Medical: No    Non-medical: No  Tobacco Use  . Smoking status: Never Smoker  . Smokeless tobacco: Never Used  Substance and Sexual Activity  . Alcohol use: No    Comment: very occasional  . Drug use: No  . Sexual activity: Not Currently  Lifestyle  . Physical activity    Days per week: 0 days    Minutes per session: 0 min  . Stress: Not at all   Relationships  . Social Musicianconnections    Talks on phone: Not on file    Gets together: Not on file    Attends religious service: Not on file    Active member of club or organization: Not on file    Attends meetings of clubs or organizations: Not on file    Relationship status: Not on file  Other Topics Concern  . Not on file  Social History Narrative   Patient is married with 5 children.   Patient is right handed.   Patient has college education.   Patient drinks 3 cups daily.          Outpatient Encounter Medications as of 08/24/2018  Medication Sig  . acetaminophen (TYLENOL) 500 MG tablet Take 1,000 mg by mouth every 6 (six) hours as needed.  . Calcium Carbonate-Vit D-Min (CALCIUM 1200 PO) Take 1 capsule by mouth daily.  . cetirizine (ZYRTEC) 10 MG tablet Take 10 mg by mouth daily.  . Cholecalciferol (VITAMIN D) 2000 units tablet Take 2,000 Units by mouth daily.  . clopidogrel (PLAVIX) 75 MG tablet TAKE 1 TABLET BY MOUTH EVERY DAY  . fluticasone (FLONASE) 50 MCG/ACT nasal spray Place into both nostrils daily as needed for allergies or rhinitis.  . Omega-3 Fatty Acids (FISH OIL PO) Take 1 capsule by mouth daily. Takes it sometimes  . pregabalin (LYRICA) 25 MG capsule TAKE 1 CAPSULE BY MOUTH THREE TIMES A DAY  . sertraline (ZOLOFT) 25 MG tablet TAKE 1 TABLET EVERY DAY FOR DEPRESSION  . topiramate (TOPAMAX) 25 MG tablet Take one tablet in the morning and 3 in the evening  . vitamin C (ASCORBIC ACID) 500 MG tablet Take 500 mg by mouth daily.  . pneumococcal 13-valent conjugate vaccine (PREVNAR 13) SUSP injection Inject 0.5 mLs into the muscle tomorrow at 10 am for 1 dose.   No facility-administered encounter medications on file as of 08/24/2018.     Activities of Daily Living In your present state of health, do you have any difficulty performing the following activities: 08/24/2018  Hearing? Y  Comment sometimes  Vision? Y  Comment small print  Difficulty concentrating or making  decisions? N  Walking or climbing stairs? N  Dressing or bathing? N  Doing errands, shopping? N  Preparing Food and eating ? N  Using the Toilet? N  In the past six months, have you accidently leaked urine? Y  Comment wears pads  Do you have problems with loss of bowel control? Y  Comment depends on what she eats  Managing your Medications? N  Managing your Finances? N  Housekeeping or managing your Housekeeping? N  Some recent data might be hidden    Patient Care Team: Dorothyann PengSanders, Robyn, MD as PCP - General (Internal Medicine)    Assessment:   This is a routine wellness examination for DoranLinda.  Exercise Activities and  Dietary recommendations Current Exercise Habits: The patient does not participate in regular exercise at present  Goals    . Patient Stated     Wants to go to eye doctor    . Weight (lb) < 200 lb (90.7 kg)       Fall Risk Fall Risk  08/24/2018 06/17/2018 02/11/2018 04/30/2017  Falls in the past year? 0 0 0 No  Risk for fall due to : Medication side effect - - -  Follow up Falls prevention discussed - - -   Is the patient's home free of loose throw rugs in walkways, pet beds, electrical cords, etc?   yes      Grab bars in the bathroom? yes      Handrails on the stairs?   yes      Adequate lighting?   yes  Timed Get Up and Go performed: n/a  Depression Screen PHQ 2/9 Scores 08/24/2018 06/17/2018 02/11/2018 02/11/2018  PHQ - 2 Score 1 0 0 0  PHQ- 9 Score 2 - 1 -     Cognitive Function     6CIT Screen 08/24/2018  What Year? 0 points  What month? 0 points  What time? 0 points  Count back from 20 0 points  Months in reverse 0 points  Repeat phrase 0 points  Total Score 0    Immunization History  Administered Date(s) Administered  . Influenza-Unspecified 01/06/2018  . Tdap 03/22/2013    Qualifies for Shingles Vaccine? yes  Screening Tests Health Maintenance  Topic Date Due  . DEXA SCAN  11/18/2006  . PNA vac Low Risk Adult (1 of 2 - PCV13)  11/18/2006  . INFLUENZA VACCINE  10/09/2018  . TETANUS/TDAP  03/23/2023    Cancer Screenings: Lung: Low Dose CT Chest recommended if Age 27-80 years, 30 pack-year currently smoking OR have quit w/in 15years. Patient does not qualify. Breast:  Up to date on Mammogram? Yes   Up to date of Bone Density/Dexa? Yes Colorectal: not required  Additional Screenings: : Hepatitis C Screening: n/a     Plan:    Wants to lose weight and go to the eye doctor.   I have personally reviewed and noted the following in the patient's chart:   . Medical and social history . Use of alcohol, tobacco or illicit drugs  . Current medications and supplements . Functional ability and status . Nutritional status . Physical activity . Advanced directives . List of other physicians . Hospitalizations, surgeries, and ER visits in previous 12 months . Vitals . Screenings to include cognitive, depression, and falls . Referrals and appointments  In addition, I have reviewed and discussed with patient certain preventive protocols, quality metrics, and best practice recommendations. A written personalized care plan for preventive services as well as general preventive health recommendations were provided to patient.     Barb Merinoickeah E Kenzlie Disch, LPN  [id number]/[id number]/2020

## 2018-09-21 ENCOUNTER — Other Ambulatory Visit: Payer: Self-pay | Admitting: Nurse Practitioner

## 2018-10-21 ENCOUNTER — Telehealth: Payer: Self-pay

## 2018-10-21 ENCOUNTER — Other Ambulatory Visit: Payer: Self-pay

## 2018-10-21 ENCOUNTER — Encounter: Payer: Self-pay | Admitting: Nurse Practitioner

## 2018-10-21 ENCOUNTER — Ambulatory Visit: Payer: Medicare Other

## 2018-10-21 ENCOUNTER — Ambulatory Visit (INDEPENDENT_AMBULATORY_CARE_PROVIDER_SITE_OTHER): Payer: Medicare Other | Admitting: Nurse Practitioner

## 2018-10-21 VITALS — BP 118/72 | HR 66 | Temp 98.1°F | Ht 62.8 in | Wt 165.2 lb

## 2018-10-21 DIAGNOSIS — Z0000 Encounter for general adult medical examination without abnormal findings: Secondary | ICD-10-CM | POA: Diagnosis not present

## 2018-10-21 DIAGNOSIS — M797 Fibromyalgia: Secondary | ICD-10-CM

## 2018-10-21 DIAGNOSIS — E559 Vitamin D deficiency, unspecified: Secondary | ICD-10-CM

## 2018-10-21 DIAGNOSIS — R7309 Other abnormal glucose: Secondary | ICD-10-CM | POA: Insufficient documentation

## 2018-10-21 DIAGNOSIS — Z0001 Encounter for general adult medical examination with abnormal findings: Secondary | ICD-10-CM

## 2018-10-21 DIAGNOSIS — N390 Urinary tract infection, site not specified: Secondary | ICD-10-CM | POA: Diagnosis not present

## 2018-10-21 DIAGNOSIS — Z8673 Personal history of transient ischemic attack (TIA), and cerebral infarction without residual deficits: Secondary | ICD-10-CM | POA: Diagnosis not present

## 2018-10-21 DIAGNOSIS — Z23 Encounter for immunization: Secondary | ICD-10-CM

## 2018-10-21 DIAGNOSIS — F3341 Major depressive disorder, recurrent, in partial remission: Secondary | ICD-10-CM | POA: Diagnosis not present

## 2018-10-21 LAB — POCT URINALYSIS DIPSTICK
Bilirubin, UA: NEGATIVE
Blood, UA: NEGATIVE
Glucose, UA: NEGATIVE
Ketones, UA: NEGATIVE
Nitrite, UA: POSITIVE
Protein, UA: NEGATIVE
Spec Grav, UA: 1.025 (ref 1.010–1.025)
Urobilinogen, UA: 0.2 E.U./dL
pH, UA: 5.5 (ref 5.0–8.0)

## 2018-10-21 MED ORDER — SERTRALINE HCL 25 MG PO TABS: 25.0000 mg | ORAL_TABLET | Freq: Every day | ORAL | 1 refills | Status: DC

## 2018-10-21 MED ORDER — NITROFURANTOIN MONOHYD MACRO 100 MG PO CAPS: 100.0000 mg | ORAL_CAPSULE | Freq: Two times a day (BID) | ORAL | 0 refills | Status: AC

## 2018-10-21 NOTE — Progress Notes (Signed)
Subjective:     Patient ID: [name] , female    DOB: 04/24/1940 , 78 y.o.   MRN: [id number]   Chief Complaint  Patient presents with  . Annual Exam  . Edema    patient stated her leg has been swollen for a while and she has been using voltaren but it is not working. she is unable to sleep due to the pain   The patient states she uses post menopausal status for birth control. Last LMP was No LMP recorded. Patient is postmenopausal. Negative for Dysmenorrhea and Negative for Menorrhagia Mammogram last done 05/2017.  Negative for: breast discharge, breast lump(s), breast pain and breast self exam.  Pertinent negatives include abnormal bleeding (hematology), anxiety, decreased libido, depression, difficulty falling sleep, dyspareunia, history of infertility, nocturia, sexual dysfunction, sleep disturbances, urinary incontinence, urinary urgency, vaginal discharge and vaginal itching. Diet regular.The patient states her exercise level is  minimal     The patient's tobacco use is:  Social History   Tobacco Use  Smoking Status Never Smoker  Smokeless Tobacco Never Used   She has been exposed to passive smoke. The patient's alcohol use is:   Social History   Substance and Sexual Activity  Alcohol Use No   Comment: very occasional    HPI  Here for HM  Depression      The patient presents with depression.  This is a chronic problem.  The current episode started more than 1 year ago.   The onset quality is gradual.   The problem occurs intermittently.  The problem has been gradually improving since onset.  Associated symptoms include no decreased concentration.     The symptoms are aggravated by nothing.  Past treatments include SSRIs - Selective serotonin reuptake inhibitors.  Compliance with treatment is good.  Past medical history includes fibromyalgia and depression.     Pertinent negatives include no chronic fatigue syndrome.    Past Medical History:  Diagnosis Date  .  Abnormality of gait 04/10/2015  . Cervical spondylosis without myelopathy 07/18/2013  . Common migraine with intractable migraine 04/10/2015  . DJD (degenerative joint disease)   . Fibromyalgia   . Hiatal hernia   . Migraine headache      Family History  Problem Relation Age of Onset  . CVA Mother   . Stroke Father   . Emphysema Father      Current Outpatient Medications:  .  acetaminophen (TYLENOL) 500 MG tablet, Take 1,000 mg by mouth every 6 (six) hours as needed., Disp: , Rfl:  .  Calcium Carbonate-Vit D-Min (CALCIUM 1200 PO), Take 1 capsule by mouth daily., Disp: , Rfl:  .  cetirizine (ZYRTEC) 10 MG tablet, Take 10 mg by mouth daily., Disp: , Rfl:  .  Cholecalciferol (VITAMIN D) 2000 units tablet, Take 2,000 Units by mouth daily., Disp: , Rfl:  .  clopidogrel (PLAVIX) 75 MG tablet, TAKE 1 TABLET BY MOUTH EVERY DAY, Disp: 90 tablet, Rfl: 1 .  fluticasone (FLONASE) 50 MCG/ACT nasal spray, Place into both nostrils daily as needed for allergies or rhinitis., Disp: , Rfl:  .  Omega-3 Fatty Acids (FISH OIL PO), Take 1 capsule by mouth daily. Takes it sometimes, Disp: , Rfl:  .  pregabalin (LYRICA) 25 MG capsule, TAKE 1 CAPSULE BY MOUTH THREE TIMES A DAY, Disp: 270 capsule, Rfl: 1 .  sertraline (ZOLOFT) 25 MG tablet, TAKE 1 TABLET EVERY DAY FOR DEPRESSION, Disp: 90 tablet, Rfl: 1 .  topiramate (TOPAMAX) 25 MG  tablet, Take one tablet in the morning and 3 in the evening, Disp: 360 tablet, Rfl: 2 .  vitamin C (ASCORBIC ACID) 500 MG tablet, Take 500 mg by mouth daily., Disp: , Rfl:    Allergies  Allergen Reactions  . Aspirin Anaphylaxis and Hives  . Elavil [Amitriptyline] Anaphylaxis  . Latex   . Relafen [Nabumetone] Hives     Review of Systems  Constitutional: Negative.   HENT: Negative.   Eyes: Negative.   Respiratory: Negative.   Cardiovascular: Negative.   Gastrointestinal: Negative.   Endocrine: Negative.   Genitourinary: Negative.   Musculoskeletal: Negative.   Skin:  Negative.   Allergic/Immunologic: Negative.   Neurological: Negative.   Hematological: Negative.   Psychiatric/Behavioral: Positive for depression. Negative for decreased concentration.     Today's Vitals   10/21/18 1008  BP: 118/72  Pulse: 66  Temp: 98.1 F (36.7 C)  TempSrc: Oral  Weight: 165 lb 3.2 oz (74.9 kg)  Height: 5' 2.8" (1.595 m)  PainSc: 5   PainLoc: Leg   Body mass index is 29.45 kg/m.   Objective:  Physical Exam Vitals signs reviewed.  Constitutional:      Appearance: Normal appearance. She is well-developed.  HENT:     Head: Normocephalic and atraumatic.     Right Ear: Hearing, tympanic membrane, ear canal and external ear normal.     Left Ear: Hearing, tympanic membrane, ear canal and external ear normal.     Nose: Nose normal.     Mouth/Throat:     Mouth: Mucous membranes are moist.  Eyes:     General: Lids are normal.     Conjunctiva/sclera: Conjunctivae normal.     Pupils: Pupils are equal, round, and reactive to light.     Funduscopic exam:    Right eye: No papilledema.        Left eye: No papilledema.  Neck:     Musculoskeletal: Full passive range of motion without pain, normal range of motion and neck supple.     Thyroid: No thyroid mass.     Vascular: No carotid bruit.  Cardiovascular:     Rate and Rhythm: Normal rate and regular rhythm.     Pulses: Normal pulses.     Heart sounds: Normal heart sounds. No murmur.  Pulmonary:     Effort: Pulmonary effort is normal.     Breath sounds: Normal breath sounds.  Abdominal:     General: Abdomen is flat. Bowel sounds are normal.     Palpations: Abdomen is soft.  Musculoskeletal: Normal range of motion.        General: No swelling (right knee swelling) or tenderness.     Right lower leg: No edema.     Left lower leg: No edema.  Skin:    General: Skin is warm and dry.     Capillary Refill: Capillary refill takes less than 2 seconds.  Neurological:     General: No focal deficit present.      Mental Status: She is alert and oriented to person, place, and time.     Cranial Nerves: No cranial nerve deficit.     Sensory: No sensory deficit.  Psychiatric:        Mood and Affect: Mood normal.        Behavior: Behavior normal.        Thought Content: Thought content normal.        Judgment: Judgment normal.         Assessment And Plan:  1. Encounter for general adult medical examination w/o abnormal findings . Behavior modifications discussed and diet history reviewed.   . Pt will continue to exercise regularly and modify diet with low GI, plant based foods and decrease intake of processed foods.  . Recommend intake of daily multivitamin, Vitamin D, and calcium.  . Recommend mammogram and colonoscopy for preventive screenings, as well as recommend immunizations that include influenza - POCT Urinalysis Dipstick (81002)  2. Encounter for immunization Pneumonia 23 given in office - Pneumococcal polysaccharide vaccine 23-valent greater than or equal to 2yo subcutaneous/IM  3. Recurrent major depressive disorder, in partial remission (HCC)  Chronic  Currently doing well.  - BMP8+Anion Gap - sertraline (ZOLOFT) 25 MG tablet; Take 1 tablet (25 mg total) by mouth daily.  Dispense: 90 tablet; Refill: 1  4. Fibromyalgia  Continue with sertraline daily  5. History of TIA (transient ischemic attack)  Continue with plavix daily  - POCT Urinalysis Dipstick (81002) - CBC no Diff  6. Vitamin D deficiency  Will check vitamin D level and supplement as needed.     Also encouraged to spend 15 minutes in the sun daily.  - Vitamin D (25 hydroxy)  7. Urinary tract infection without hematuria, site unspecified  Positive nitrates urine culture sent  Rx for nitrofurantoin also sent to pharmacy - Culture, Urine - nitrofurantoin, macrocrystal-monohydrate, (MACROBID) 100 MG capsule; Take 1 capsule (100 mg total) by mouth 2 (two) times daily for 5 days.  Dispense: 10 capsule;  Refill: 0  Mary FeltsJanece Shelvie Salsberry, FNP    THE PATIENT IS ENCOURAGED TO PRACTICE SOCIAL DISTANCING DUE TO THE COVID-19 PANDEMIC.

## 2018-10-21 NOTE — Telephone Encounter (Signed)
I attempted to call pt to inform her she has a UTI and we have sent medicine to the pharmacy. I was unable to leave v/m due to v/m not being set up. YRL,RMA

## 2018-10-22 LAB — CBC
Hematocrit: 42.8 % (ref 34.0–46.6)
Hemoglobin: 14.7 g/dL (ref 11.1–15.9)
MCH: 31.2 pg (ref 26.6–33.0)
MCHC: 34.3 g/dL (ref 31.5–35.7)
MCV: 91 fL (ref 79–97)
Platelets: 247 10*3/uL (ref 150–450)
RBC: 4.71 x10E6/uL (ref 3.77–5.28)
RDW: 12.6 % (ref 11.7–15.4)
WBC: 5.6 10*3/uL (ref 3.4–10.8)

## 2018-10-22 LAB — BMP8+ANION GAP
Anion Gap: 14 mmol/L (ref 10.0–18.0)
BUN/Creatinine Ratio: 18 (ref 12–28)
BUN: 17 mg/dL (ref 8–27)
CO2: 24 mmol/L (ref 20–29)
Calcium: 9.9 mg/dL (ref 8.7–10.3)
Chloride: 104 mmol/L (ref 96–106)
Creatinine, Ser: 0.95 mg/dL (ref 0.57–1.00)
GFR calc Af Amer: 67 mL/min/{1.73_m2} (ref >59)
GFR calc non Af Amer: 58 mL/min/{1.73_m2} — ABNORMAL LOW (ref >59)
Glucose: 92 mg/dL (ref 65–99)
Potassium: 4.2 mmol/L (ref 3.5–5.2)
Sodium: 142 mmol/L (ref 134–144)

## 2018-10-22 LAB — LIPID PANEL
Chol/HDL Ratio: 2.9 ratio (ref 0.0–4.4)
Cholesterol, Total: 167 mg/dL (ref 100–199)
HDL: 58 mg/dL (ref >39)
LDL Calculated: 74 mg/dL (ref 0–99)
Triglycerides: 175 mg/dL — ABNORMAL HIGH (ref 0–149)
VLDL Cholesterol Cal: 35 mg/dL (ref 5–40)

## 2018-10-22 LAB — VITAMIN D 25 HYDROXY (VIT D DEFICIENCY, FRACTURES): Vit D, 25-Hydroxy: 45 ng/mL (ref 30.0–100.0)

## 2018-10-23 LAB — URINE CULTURE

## 2018-10-27 ENCOUNTER — Telehealth: Payer: Self-pay

## 2018-10-27 NOTE — Telephone Encounter (Signed)
Patient notified of her lab results and she stated she did pick up her medication for UTI. [name]

## 2018-11-15 NOTE — Progress Notes (Signed)
PATIENT: [name] DOB: [birth year]  REASON FOR VISIT: follow up HISTORY FROM: patient  HISTORY OF PRESENT ILLNESS: Today 11/16/18  [name] is a 78 year old female with history of fibromyalgia and headache.  She remains on Lyrica 25 mg 3 times a day and this is providing adequate management of her discomfort and pain.  She does report pain and swelling to her right knee for several months.  She says she will be going to see an orthopedic doctor when she returns from vacation.  She reports her headaches have improved with the increased dose of Topamax.  She remains on Topamax 25 mg in the morning, 75 mg at bedtime.  She reports on average she may have 4 headaches a month.  She will take Tylenol and lie down with headache relief.  She reports her granddaughter currently lives with her.  She is able to perform all of her daily activities independently.  She does drive a car without difficulty.  She is tolerating her medications well.  She presents today for follow-up unaccompanied.  HISTORY 05/13/2018 Dr. Anne Hahn: Ms. Shu is a 78 year old right-handed white female with a history of fibromyalgia.  The patient is on very low-dose Lyrica, she seems to tolerate this fairly well and she is not having excessive amounts of discomfort and pain.  She does have headaches anywhere from 1-3 times a week, she is on Topamax 75 mg at night, she has been on a stable dose for a number of years.  She tolerates the medication well.  She does not exercise much during the winter because of the cold weather.  She does better during the warmer months, she claims that weather changes and exposure to smoke worsens her headache.  REVIEW OF SYSTEMS: Out of a complete 14 system review of symptoms, the patient complains only of the following symptoms, and all other reviewed systems are negative.  Headache, weakness  ALLERGIES: Allergies  Allergen Reactions  . Aspirin Anaphylaxis and Hives  . Elavil  [Amitriptyline] Anaphylaxis  . Latex   . Relafen [Nabumetone] Hives    HOME MEDICATIONS: Outpatient Medications Prior to Visit  Medication Sig Dispense Refill  . acetaminophen (TYLENOL) 500 MG tablet Take 1,000 mg by mouth every 6 (six) hours as needed.    . Calcium Carbonate-Vit D-Min (CALCIUM 1200 PO) Take 1 capsule by mouth daily.    . cetirizine (ZYRTEC) 10 MG tablet Take 10 mg by mouth daily.    . Cholecalciferol (VITAMIN D) 2000 units tablet Take 2,000 Units by mouth daily.    . clopidogrel (PLAVIX) 75 MG tablet TAKE 1 TABLET BY MOUTH EVERY DAY 90 tablet 1  . fluticasone (FLONASE) 50 MCG/ACT nasal spray Place into both nostrils daily as needed for allergies or rhinitis.    . Omega-3 Fatty Acids (FISH OIL PO) Take 1 capsule by mouth daily. Takes it sometimes    . sertraline (ZOLOFT) 25 MG tablet Take 1 tablet (25 mg total) by mouth daily. 90 tablet 1  . vitamin C (ASCORBIC ACID) 500 MG tablet Take 500 mg by mouth daily.    . pregabalin (LYRICA) 25 MG capsule TAKE 1 CAPSULE BY MOUTH THREE TIMES A DAY 270 capsule 1  . topiramate (TOPAMAX) 25 MG tablet Take one tablet in the morning and 3 in the evening 360 tablet 2   No facility-administered medications prior to visit.     PAST MEDICAL HISTORY: Past Medical History:  Diagnosis Date  . Abnormality of gait 04/10/2015  .  Cervical spondylosis without myelopathy 07/18/2013  . Common migraine with intractable migraine 04/10/2015  . DJD (degenerative joint disease)   . Fibromyalgia   . Hiatal hernia   . Migraine headache     PAST SURGICAL HISTORY: Past Surgical History:  Procedure Laterality Date  . ABDOMINAL HYSTERECTOMY    . DILATION AND CURETTAGE OF UTERUS    . HIATAL HERNIA REPAIR    . TONSILLECTOMY      FAMILY HISTORY: Family History  Problem Relation Age of Onset  . CVA Mother   . Stroke Father   . Emphysema Father     SOCIAL HISTORY: Social History   Socioeconomic History  . Marital status: Widowed    Spouse  name: Not on file  . Number of children: 5  . Years of education: college  . Highest education level: Not on file  Occupational History  . Occupation: Retired  Scientific laboratory technician  . Financial resource strain: Not hard at all  . Food insecurity    Worry: Never true    Inability: Never true  . Transportation needs    Medical: No    Non-medical: No  Tobacco Use  . Smoking status: Never Smoker  . Smokeless tobacco: Never Used  Substance and Sexual Activity  . Alcohol use: No    Comment: very occasional  . Drug use: No  . Sexual activity: Not Currently  Lifestyle  . Physical activity    Days per week: 0 days    Minutes per session: 0 min  . Stress: Not at all  Relationships  . Social Herbalist on phone: Not on file    Gets together: Not on file    Attends religious service: Not on file    Active member of club or organization: Not on file    Attends meetings of clubs or organizations: Not on file    Relationship status: Not on file  . Intimate partner violence    Fear of current or ex partner: No    Emotionally abused: No    Physically abused: No    Forced sexual activity: No  Other Topics Concern  . Not on file  Social History Narrative   Patient is married with 5 children.   Patient is right handed.   Patient has college education.   Patient drinks 3 cups daily.          PHYSICAL EXAM  Vitals:   11/16/18 1404  BP: 108/80  Pulse: 69  Temp: 98.6 F (37 C)  Weight: 166 lb 6.4 oz (75.5 kg)  Height: 5\' 4"  (1.626 m)   Body mass index is 28.56 kg/m.  Generalized: Well developed, in no acute distress   Neurological examination  Mentation: Alert oriented to time, place, history taking. Follows all commands speech and language fluent Cranial nerve II-XII: Pupils were equal round reactive to light. Extraocular movements were full, visual field were full on confrontational test. Facial sensation and strength were normal. Head turning and shoulder shrug  were  normal and symmetric. Motor: The motor testing reveals 5 over 5 strength of all 4 extremities, but movement of the right knee is pain provoking.  Good symmetric motor tone is noted throughout.  Sensory: Sensory testing is intact to soft touch on all 4 extremities. No evidence of extinction is noted.  Coordination: Cerebellar testing reveals good finger-nose-finger and heel-to-shin bilaterally.  Gait and station: Gait is unsteady, uses a cane, is wide-based. Reflexes: Deep tendon reflexes are symmetric and normal  bilaterally.   DIAGNOSTIC DATA (LABS, IMAGING, TESTING) - I reviewed patient records, labs, notes, testing and imaging myself where available.  Lab Results  Component Value Date   WBC 5.6 10/21/2018   HGB 14.7 10/21/2018   HCT 42.8 10/21/2018   MCV 91 10/21/2018   PLT 247 10/21/2018      Component Value Date/Time   NA 142 10/21/2018 1109   K 4.2 10/21/2018 1109   CL 104 10/21/2018 1109   CO2 24 10/21/2018 1109   GLUCOSE 92 10/21/2018 1109   GLUCOSE 95 05/30/2013 0629   BUN 17 10/21/2018 1109   CREATININE 0.95 10/21/2018 1109   CALCIUM 9.9 10/21/2018 1109   PROT 6.6 05/29/2013 1729   ALBUMIN 3.8 05/29/2013 1729   AST 13 05/29/2013 1729   ALT 9 05/29/2013 1729   ALKPHOS 72 05/29/2013 1729   BILITOT 0.2 (L) 05/29/2013 1729   GFRNONAA 58 (L) 10/21/2018 1109   GFRAA 67 10/21/2018 1109   Lab Results  Component Value Date   CHOL 167 10/21/2018   HDL 58 10/21/2018   LDLCALC 74 10/21/2018   TRIG 175 (H) 10/21/2018   CHOLHDL 2.9 10/21/2018   Lab Results  Component Value Date   HGBA1C 5.5 02/11/2018   Lab Results  Component Value Date   VITAMINB12 407 01/10/2008     ASSESSMENT AND PLAN 78 y.o. year old female  has a past medical history of Abnormality of gait (04/10/2015), Cervical spondylosis without myelopathy (07/18/2013), Common migraine with intractable migraine (04/10/2015), DJD (degenerative joint disease), Fibromyalgia, Hiatal hernia, and Migraine headache.  here with:  1.  Fibromyalgia 2.  Headaches  She will continue taking Topamax 25 mg in the morning, 75 mg in the evening.  The increased dose has been beneficial, she reports about 4 headaches a month.  She will remain on Lyrica 25 mg 3 times a day.  She does complain of pain and swelling to her right knee, she is to schedule an appointment with her orthopedic doctor when she returns from vacation (has already discussed this with her primary).  She will follow-up in this office in 6 months or sooner if needed. I advised if her symptoms worsen or she develops any new symptoms she should let us know.  I spent 15 minutes with the patient. 50% of this time was spent discussing her plan of care.  Margie EgeSarah Ayahna Solazzo, AGNP-C, DNP 11/16/2018, 2:46 PM [facility] [address], [address] [place], [place] [address] [phone]

## 2018-11-16 ENCOUNTER — Other Ambulatory Visit: Payer: Self-pay

## 2018-11-16 ENCOUNTER — Ambulatory Visit (INDEPENDENT_AMBULATORY_CARE_PROVIDER_SITE_OTHER): Payer: Medicare Other | Admitting: Neurology

## 2018-11-16 ENCOUNTER — Encounter: Payer: Self-pay | Admitting: Neurology

## 2018-11-16 VITALS — BP 108/80 | HR 69 | Temp 98.6°F | Ht 64.0 in | Wt 166.4 lb

## 2018-11-16 DIAGNOSIS — M797 Fibromyalgia: Secondary | ICD-10-CM

## 2018-11-16 DIAGNOSIS — G43019 Migraine without aura, intractable, without status migrainosus: Secondary | ICD-10-CM

## 2018-11-16 MED ORDER — TOPIRAMATE 25 MG PO TABS: ORAL_TABLET | ORAL | 2 refills | Status: DC

## 2018-11-16 MED ORDER — PREGABALIN 25 MG PO CAPS: ORAL_CAPSULE | ORAL | 1 refills | Status: DC

## 2018-11-16 NOTE — Patient Instructions (Signed)
Continue taking Topamax and Lyrica.

## 2018-11-16 NOTE — Progress Notes (Signed)
I have read the note, and I agree with the clinical assessment and plan.  Gabrella Stroh K Arloa Prak   

## 2019-01-31 ENCOUNTER — Telehealth (INDEPENDENT_AMBULATORY_CARE_PROVIDER_SITE_OTHER): Payer: Medicare Other | Admitting: Nurse Practitioner

## 2019-01-31 ENCOUNTER — Other Ambulatory Visit: Payer: Self-pay

## 2019-01-31 DIAGNOSIS — Z20828 Contact with and (suspected) exposure to other viral communicable diseases: Secondary | ICD-10-CM

## 2019-01-31 DIAGNOSIS — J019 Acute sinusitis, unspecified: Secondary | ICD-10-CM | POA: Diagnosis not present

## 2019-01-31 DIAGNOSIS — M791 Myalgia, unspecified site: Secondary | ICD-10-CM | POA: Diagnosis not present

## 2019-01-31 DIAGNOSIS — R52 Pain, unspecified: Secondary | ICD-10-CM

## 2019-01-31 MED ORDER — AZITHROMYCIN 250 MG PO TABS: ORAL_TABLET | ORAL | 0 refills | Status: AC

## 2019-01-31 NOTE — Progress Notes (Signed)
Virtual Visit via Telephone   This visit type was conducted due to national recommendations for restrictions regarding the COVID-19 Pandemic (e.g. social distancing) in an effort to limit this patient's exposure and mitigate transmission in our community.  Due to her co-morbid illnesses, this patient is at least at moderate risk for complications without adequate follow up.  This format is felt to be most appropriate for this patient at this time.  All issues noted in this document were discussed and addressed.  A limited physical exam was performed with this format.    This visit type was conducted due to national recommendations for restrictions regarding the COVID-19 Pandemic (e.g. social distancing) in an effort to limit this patient's exposure and mitigate transmission in our community.  Patients identity confirmed using two different identifiers.  This format is felt to be most appropriate for this patient at this time.  All issues noted in this document were discussed and addressed.  No physical exam was performed (except for noted visual exam findings with Video Visits).    Date:  02/05/2019   ID:  MCKENNA GAMM, DOB Sep 02, 1940, MRN [id number]  Patient Location:  Home - spoke with Elige Radon  Provider location:   Office    Chief Complaint:    History of Present Illness:    DIAMONIQUE RUEDAS is a 78 y.o. female who presents via video conferencing for a telehealth visit today.    The patient does have symptoms concerning for COVID-19 infection (fever, chills, cough, or new shortness of breath).   She was having sharp pains throughout her body last Thursday. Her allergies was "acting up" worse than usual.  She took some tylenol which was beneficial.  She has been staying in the house most of the time.  She went out last Saturday for a family birthday.  She took her family member to the hospital for a procedure at [place] (97.1).  She could not walk well when she went, there were  also people present smoking cigarettes.      Past Medical History:  Diagnosis Date  . Abnormality of gait 04/10/2015  . Cervical spondylosis without myelopathy 07/18/2013  . Common migraine with intractable migraine 04/10/2015  . DJD (degenerative joint disease)   . Fibromyalgia   . Hiatal hernia   . Migraine headache    Past Surgical History:  Procedure Laterality Date  . ABDOMINAL HYSTERECTOMY    . DILATION AND CURETTAGE OF UTERUS    . HIATAL HERNIA REPAIR    . TONSILLECTOMY       Current Meds  Medication Sig  . acetaminophen (TYLENOL) 500 MG tablet Take 1,000 mg by mouth every 6 (six) hours as needed.  . Calcium Carbonate-Vit D-Min (CALCIUM 1200 PO) Take 1 capsule by mouth daily.  . cetirizine (ZYRTEC) 10 MG tablet Take 10 mg by mouth daily.  . Cholecalciferol (VITAMIN D) 2000 units tablet Take 2,000 Units by mouth daily.  . clopidogrel (PLAVIX) 75 MG tablet TAKE 1 TABLET BY MOUTH EVERY DAY  . fluticasone (FLONASE) 50 MCG/ACT nasal spray Place into both nostrils daily as needed for allergies or rhinitis.  . Omega-3 Fatty Acids (FISH OIL PO) Take 1 capsule by mouth daily. Takes it sometimes  . pregabalin (LYRICA) 25 MG capsule TAKE 1 CAPSULE BY MOUTH THREE TIMES A DAY  . sertraline (ZOLOFT) 25 MG tablet Take 1 tablet (25 mg total) by mouth daily.  Marland Kitchen topiramate (TOPAMAX) 25 MG tablet Take one tablet in the morning and  3 in the evening  . vitamin C (ASCORBIC ACID) 250 MG tablet Take 250 mg by mouth daily.      Allergies:   Aspirin, Elavil [amitriptyline], Latex, and Relafen [nabumetone]   Social History   Tobacco Use  . Smoking status: Never Smoker  . Smokeless tobacco: Never Used  Substance Use Topics  . Alcohol use: No    Comment: very occasional  . Drug use: No     Family Hx: The patient's family history includes CVA in her mother; Emphysema in her father; Stroke in her father.  ROS:   Please see the history of present illness.    Review of Systems   Constitutional: Negative for chills, fever and malaise/fatigue.  HENT: Positive for congestion (nasal congestion). Negative for sore throat.   Respiratory: Negative.   Cardiovascular: Negative.   Musculoskeletal: Positive for myalgias.  Neurological: Negative for dizziness and headaches.  Psychiatric/Behavioral: Negative.     All other systems reviewed and are negative.   Labs/Other Tests and Data Reviewed:    Recent Labs: 10/21/2018: BUN 17; Creatinine, Ser 0.95; Hemoglobin 14.7; Platelets 247; Potassium 4.2; Sodium 142   Recent Lipid Panel Lab Results  Component Value Date/Time   CHOL 167 10/21/2018 11:09 AM   TRIG 175 (H) 10/21/2018 11:09 AM   HDL 58 10/21/2018 11:09 AM   CHOLHDL 2.9 10/21/2018 11:09 AM   CHOLHDL 2.6 05/30/2013 06:29 AM   LDLCALC 74 10/21/2018 11:09 AM    Wt Readings from Last 3 Encounters:  11/16/18 166 lb 6.4 oz (75.5 kg)  10/21/18 165 lb 3.2 oz (74.9 kg)  08/24/18 165 lb (74.8 kg)     Exam:    Vital Signs:  There were no vitals taken for this visit.    Physical Exam  Constitutional: She is oriented to person, place, and time and well-developed, well-nourished, and in no distress. No distress.  Pulmonary/Chest: Effort normal.  Neurological: She is alert and oriented to person, place, and time.  Psychiatric: Mood, memory, affect and judgment normal.    ASSESSMENT & PLAN:    1. Acute non-recurrent sinusitis, unspecified location  She is complaining of discomfort to her nasal and frontal areas, has been feeling bad for approximately 7 days  She has been in a group setting as well with people who did not have on mask, I would like to check her for coronavirus as well - Novel Coronavirus, NAA ([facility]) - azithromycin (ZITHROMAX) 250 MG tablet; Take 2 tablets (500 mg) on  Day 1,  followed by 1 tablet (250 mg) once daily on Days 2 through 5.  Dispense: 6 each; Refill: 0  2. Body aches  I would like her to be checked for coronavirus since this  is a symptom at [facility]   COVID-19 Education: The signs and symptoms of COVID-19 were discussed with the patient and how to seek care for testing (follow up with PCP or arrange E-visit).  The importance of social distancing was discussed today.  Patient Risk:   After full review of this patients clinical status, I feel that they are at least moderate risk at this time.  Time:   Today, I have spent 12 minutes/ seconds with the patient with telehealth technology discussing above diagnoses.     Medication Adjustments/Labs and Tests Ordered: Current medicines are reviewed at length with the patient today.  Concerns regarding medicines are outlined above.   Tests Ordered: Orders Placed This Encounter  Procedures  . Novel Coronavirus, NAA ([facility])  Medication Changes: Meds ordered this encounter  Medications  . azithromycin (ZITHROMAX) 250 MG tablet    Sig: Take 2 tablets (500 mg) on  Day 1,  followed by 1 tablet (250 mg) once daily on Days 2 through 5.    Dispense:  6 each    Refill:  0    Disposition:  Follow up prn  Signed, Arnette FeltsJanece Lashondra Vaquerano, FNP

## 2019-02-05 ENCOUNTER — Encounter: Payer: Self-pay | Admitting: Nurse Practitioner

## 2019-02-24 ENCOUNTER — Encounter: Payer: Self-pay | Admitting: Nurse Practitioner

## 2019-02-24 ENCOUNTER — Other Ambulatory Visit: Payer: Self-pay

## 2019-02-24 ENCOUNTER — Ambulatory Visit (INDEPENDENT_AMBULATORY_CARE_PROVIDER_SITE_OTHER): Payer: Medicare Other | Admitting: Nurse Practitioner

## 2019-02-24 VITALS — BP 102/70 | HR 72 | Temp 98.2°F | Ht 63.0 in | Wt 165.2 lb

## 2019-02-24 DIAGNOSIS — G8929 Other chronic pain: Secondary | ICD-10-CM

## 2019-02-24 DIAGNOSIS — Z23 Encounter for immunization: Secondary | ICD-10-CM

## 2019-02-24 DIAGNOSIS — Z8673 Personal history of transient ischemic attack (TIA), and cerebral infarction without residual deficits: Secondary | ICD-10-CM | POA: Diagnosis not present

## 2019-02-24 DIAGNOSIS — E559 Vitamin D deficiency, unspecified: Secondary | ICD-10-CM

## 2019-02-24 DIAGNOSIS — R350 Frequency of micturition: Secondary | ICD-10-CM

## 2019-02-24 DIAGNOSIS — M25561 Pain in right knee: Secondary | ICD-10-CM

## 2019-02-24 DIAGNOSIS — F3341 Major depressive disorder, recurrent, in partial remission: Secondary | ICD-10-CM

## 2019-02-24 DIAGNOSIS — R7309 Other abnormal glucose: Secondary | ICD-10-CM

## 2019-02-24 MED ORDER — CLOPIDOGREL BISULFATE 75 MG PO TABS: 75.0000 mg | ORAL_TABLET | Freq: Every day | ORAL | 1 refills | Status: DC

## 2019-02-24 NOTE — Progress Notes (Signed)
This visit occurred during the SARS-CoV-2 public health emergency.  Safety protocols were in place, including screening questions prior to the visit, additional usage of staff PPE, and extensive cleaning of exam room while observing appropriate contact time as indicated for disinfecting solutions.  Subjective:     Patient ID: [name] , female    DOB: [birth year] , 78 y.o.   MRN: [id number]   Chief Complaint  Patient presents with  . Depression    HPI  Depression        This is a chronic problem.  The current episode started more than 1 year ago.   The onset quality is gradual.   The problem occurs constantly.  Associated symptoms include no decreased concentration, no fatigue and no headaches.     The symptoms are aggravated by nothing.  Past treatments include SSRIs - Selective serotonin reuptake inhibitors.  Previous treatment provided no relief relief.   Pertinent negatives include no chronic fatigue syndrome.    Past Medical History:  Diagnosis Date  . Abnormality of gait 04/10/2015  . Cervical spondylosis without myelopathy 07/18/2013  . Common migraine with intractable migraine 04/10/2015  . DJD (degenerative joint disease)   . Fibromyalgia   . Hiatal hernia   . Migraine headache      Family History  Problem Relation Age of Onset  . CVA Mother   . Stroke Father   . Emphysema Father      Current Outpatient Medications:  .  acetaminophen (TYLENOL) 500 MG tablet, Take 1,000 mg by mouth every 6 (six) hours as needed., Disp: , Rfl:  .  Calcium Carbonate-Vit D-Min (CALCIUM 1200 PO), Take 1 capsule by mouth daily., Disp: , Rfl:  .  cetirizine (ZYRTEC) 10 MG tablet, Take 10 mg by mouth daily., Disp: , Rfl:  .  Cholecalciferol (VITAMIN D) 2000 units tablet, Take 2,000 Units by mouth daily., Disp: , Rfl:  .  clopidogrel (PLAVIX) 75 MG tablet, TAKE 1 TABLET BY MOUTH EVERY DAY, Disp: 90 tablet, Rfl: 1 .  fluticasone (FLONASE) 50 MCG/ACT nasal spray, Place into both nostrils  daily as needed for allergies or rhinitis., Disp: , Rfl:  .  Omega-3 Fatty Acids (FISH OIL PO), Take 1 capsule by mouth daily. Takes it sometimes, Disp: , Rfl:  .  pregabalin (LYRICA) 25 MG capsule, TAKE 1 CAPSULE BY MOUTH THREE TIMES A DAY, Disp: 270 capsule, Rfl: 1 .  sertraline (ZOLOFT) 25 MG tablet, Take 1 tablet (25 mg total) by mouth daily., Disp: 90 tablet, Rfl: 1 .  topiramate (TOPAMAX) 25 MG tablet, Take one tablet in the morning and 3 in the evening, Disp: 360 tablet, Rfl: 2 .  vitamin C (ASCORBIC ACID) 250 MG tablet, Take 250 mg by mouth daily. , Disp: , Rfl:    Allergies  Allergen Reactions  . Aspirin Anaphylaxis and Hives  . Elavil [Amitriptyline] Anaphylaxis  . Latex   . Relafen [Nabumetone] Hives     Review of Systems  Constitutional: Negative for fatigue.  Respiratory: Negative.   Cardiovascular: Negative.  Negative for chest pain, palpitations and leg swelling.  Genitourinary: Negative.   Neurological: Negative for dizziness and headaches.  Psychiatric/Behavioral: Positive for depression. Negative for decreased concentration.     Today's Vitals   02/24/19 1023  BP: 102/70  Pulse: 72  Temp: 98.2 F (36.8 C)  TempSrc: Oral  Weight: 165 lb 3.2 oz (74.9 kg)  Height: 5\' 3"  (1.6 m)  PainSc: 0-No pain   Body mass index  is 29.26 kg/m.   Objective:  Physical Exam Constitutional:      Appearance: Normal appearance.  Cardiovascular:     Rate and Rhythm: Normal rate and regular rhythm.  Pulmonary:     Effort: Pulmonary effort is normal. No respiratory distress.     Breath sounds: Normal breath sounds.  Skin:    Capillary Refill: Capillary refill takes less than 2 seconds.  Neurological:     General: No focal deficit present.     Mental Status: She is alert and oriented to person, place, and time.  Psychiatric:        Mood and Affect: Mood normal.        Behavior: Behavior normal.        Thought Content: Thought content normal.        Judgment: Judgment  normal.         Assessment And Plan:     1. Recurrent major depressive disorder, in partial remission (HCC)  Chronic, currently controlled.  Continue with current medications  2. Vitamin D deficiency  Will check vitamin D level and supplement as needed.     Also encouraged to spend 15 minutes in the sun daily.   3. History of TIA (transient ischemic attack)  Continue with clopidogrel - clopidogrel (PLAVIX) 75 MG tablet; Take 1 tablet (75 mg total) by mouth daily.  Dispense: 90 tablet; Refill: 1  4. Abnormal glucose  Normal at last visit  Continue with healthy diet low in sugary and starches  5. Chronic pain of right knee  Wearing a knee brace  Doing okay with applying pain cream   Slightly swollen  6. Urinary frequency  Will recheck her urinalysis last visit had a urinary tract infection  Worse when drinking green tea.   7. Encounter for immunization  Called to [facility] and patient had not received her influenza vaccine  Given high dose today in office - Flu vaccine HIGH DOSE PF (Fluzone High dose)    Arnette Felts, FNP    THE PATIENT IS ENCOURAGED TO PRACTICE SOCIAL DISTANCING DUE TO THE COVID-19 PANDEMIC.

## 2019-04-11 ENCOUNTER — Other Ambulatory Visit: Payer: Self-pay | Admitting: Nurse Practitioner

## 2019-04-11 DIAGNOSIS — F3341 Major depressive disorder, recurrent, in partial remission: Secondary | ICD-10-CM

## 2019-05-16 DIAGNOSIS — Z1231 Encounter for screening mammogram for malignant neoplasm of breast: Secondary | ICD-10-CM | POA: Diagnosis not present

## 2019-05-16 LAB — HM MAMMOGRAPHY

## 2019-05-16 NOTE — Progress Notes (Signed)
PATIENT: [name] DOB: 01/01/1941  REASON FOR VISIT: follow up HISTORY FROM: patient  HISTORY OF PRESENT ILLNESS: Today 05/17/19  [name] is a 79 year old female with history of fibromyalgia and headache.  She remains on Lyrica 25 mg 3 times a day.  She is also taking Topamax.  She takes Tylenol with good benefit.  Headaches are so infrequent, she does not even keep track of them.  She sleeps well.  She has chronic right knee pain, that is bothersome.  She has low back pain, that may radiate down the right leg with her fibromyalgia.  Lyrica is helpful, some days, she may only take 25 mg twice a day, for bad days she will take 3 times a day.  She has not been as active as she usually is.  She has not had any falls.  She uses a quad cane.  She drives a car, her granddaughter lives with her.  She denies any new problems or concerns.  She presents today for evaluation unaccompanied.  HISTORY 11/16/2018 SS: [name] is a 79 year old female with history of fibromyalgia and headache.  She remains on Lyrica 25 mg 3 times a day and this is providing adequate management of her discomfort and pain.  She does report pain and swelling to her right knee for several months.  She says she will be going to see an orthopedic doctor when she returns from vacation.  She reports her headaches have improved with the increased dose of Topamax.  She remains on Topamax 25 mg in the morning, 75 mg at bedtime.  She reports on average she may have 4 headaches a month.  She will take Tylenol and lie down with headache relief.  She reports her granddaughter currently lives with her.  She is able to perform all of her daily activities independently.  She does drive a car without difficulty.  She is tolerating her medications well.  She presents today for follow-up unaccompanied.   REVIEW OF SYSTEMS: Out of a complete 14 system review of symptoms, the patient complains only of the following symptoms, and all other  reviewed systems are negative.  Headache  ALLERGIES: Allergies  Allergen Reactions  . Aspirin Anaphylaxis and Hives  . Elavil [Amitriptyline] Anaphylaxis  . Latex   . Relafen [Nabumetone] Hives    HOME MEDICATIONS: Outpatient Medications Prior to Visit  Medication Sig Dispense Refill  . acetaminophen (TYLENOL) 500 MG tablet Take 1,000 mg by mouth every 6 (six) hours as needed.    . Boswellia-Glucosamine-Vit D (OSTEO BI-FLEX ONE PER DAY PO) Take by mouth.    . Calcium Carbonate-Vit D-Min (CALCIUM 1200 PO) Take 1 capsule by mouth daily.    . cetirizine (ZYRTEC) 10 MG tablet Take 10 mg by mouth daily.    . Cholecalciferol (VITAMIN D) 2000 units tablet Take 2,000 Units by mouth daily.    . clopidogrel (PLAVIX) 75 MG tablet Take 1 tablet (75 mg total) by mouth daily. 90 tablet 1  . fluticasone (FLONASE) 50 MCG/ACT nasal spray Place into both nostrils daily as needed for allergies or rhinitis.    . Omega-3 Fatty Acids (FISH OIL PO) Take 1 capsule by mouth daily. Takes it sometimes    . pregabalin (LYRICA) 25 MG capsule TAKE 1 CAPSULE BY MOUTH THREE TIMES A DAY 270 capsule 1  . sertraline (ZOLOFT) 25 MG tablet TAKE 1 TABLET BY MOUTH EVERY DAY 90 tablet 1  . vitamin C (ASCORBIC ACID) 250 MG tablet Take  250 mg by mouth daily.     Marland Kitchen topiramate (TOPAMAX) 25 MG tablet Take one tablet in the morning and 3 in the evening 360 tablet 2   No facility-administered medications prior to visit.    PAST MEDICAL HISTORY: Past Medical History:  Diagnosis Date  . Abnormality of gait 04/10/2015  . Cervical spondylosis without myelopathy 07/18/2013  . Common migraine with intractable migraine 04/10/2015  . DJD (degenerative joint disease)   . Fibromyalgia   . Hiatal hernia   . Migraine headache     PAST SURGICAL HISTORY: Past Surgical History:  Procedure Laterality Date  . ABDOMINAL HYSTERECTOMY    . DILATION AND CURETTAGE OF UTERUS    . HIATAL HERNIA REPAIR    . TONSILLECTOMY      FAMILY  HISTORY: Family History  Problem Relation Age of Onset  . CVA Mother   . Stroke Father   . Emphysema Father     SOCIAL HISTORY: Social History   Socioeconomic History  . Marital status: Widowed    Spouse name: Not on file  . Number of children: 5  . Years of education: college  . Highest education level: Not on file  Occupational History  . Occupation: Retired  Tobacco Use  . Smoking status: Never Smoker  . Smokeless tobacco: Never Used  Substance and Sexual Activity  . Alcohol use: No    Comment: very occasional  . Drug use: No  . Sexual activity: Not Currently  Other Topics Concern  . Not on file  Social History Narrative   Patient is married with 5 children.   Patient is right handed.   Patient has college education.   Patient drinks 3 cups daily.         Social Determinants of Health   Financial Resource Strain: Low Risk   . Difficulty of Paying Living Expenses: Not hard at all  Food Insecurity: No Food Insecurity  . Worried About Programme researcher, broadcasting/film/video in the Last Year: Never true  . Ran Out of Food in the Last Year: Never true  Transportation Needs: No Transportation Needs  . Lack of Transportation (Medical): No  . Lack of Transportation (Non-Medical): No  Physical Activity: Inactive  . Days of Exercise per Week: 0 days  . Minutes of Exercise per Session: 0 min  Stress: No Stress Concern Present  . Feeling of Stress : Not at all  Social Connections:   . Frequency of Communication with Friends and Family: Not on file  . Frequency of Social Gatherings with Friends and Family: Not on file  . Attends Religious Services: Not on file  . Active Member of Clubs or Organizations: Not on file  . Attends Banker Meetings: Not on file  . Marital Status: Not on file  Intimate Partner Violence: Not At Risk  . Fear of Current or Ex-Partner: No  . Emotionally Abused: No  . Physically Abused: No  . Sexually Abused: No      PHYSICAL EXAM  Vitals:    05/17/19 1051  BP: 110/64  Pulse: 72  Weight: 165 lb (74.8 kg)  Height: 5\' 4"  (1.626 m)   Body mass index is 28.32 kg/m.  Generalized: Well developed, in no acute distress   Neurological examination  Mentation: Alert oriented to time, place, history taking. Follows all commands speech and language fluent Cranial nerve II-XII: Pupils were equal round reactive to light. Extraocular movements were full, visual field were full on confrontational test. Facial sensation and  strength were normal. Head turning and shoulder shrug were normal and symmetric. Motor: Good strength of all extremities Sensory: Sensory testing is intact to soft touch on all 4 extremities. No evidence of extinction is noted.  Coordination: Cerebellar testing reveals good finger-nose-finger and heel-to-shin bilaterally.  Gait and station: Able to rise from seated position without pushoff, gait is normal, steady, has a quad cane to use. Reflexes: Deep tendon reflexes are symmetric   DIAGNOSTIC DATA (LABS, IMAGING, TESTING) - I reviewed patient records, labs, notes, testing and imaging myself where available.  Lab Results  Component Value Date   WBC 5.6 10/21/2018   HGB 14.7 10/21/2018   HCT 42.8 10/21/2018   MCV 91 10/21/2018   PLT 247 10/21/2018      Component Value Date/Time   NA 142 10/21/2018 1109   K 4.2 10/21/2018 1109   CL 104 10/21/2018 1109   CO2 24 10/21/2018 1109   GLUCOSE 92 10/21/2018 1109   GLUCOSE 95 05/30/2013 0629   BUN 17 10/21/2018 1109   CREATININE 0.95 10/21/2018 1109   CALCIUM 9.9 10/21/2018 1109   PROT 6.6 05/29/2013 1729   ALBUMIN 3.8 05/29/2013 1729   AST 13 05/29/2013 1729   ALT 9 05/29/2013 1729   ALKPHOS 72 05/29/2013 1729   BILITOT 0.2 (L) 05/29/2013 1729   GFRNONAA 58 (L) 10/21/2018 1109   GFRAA 67 10/21/2018 1109   Lab Results  Component Value Date   CHOL 167 10/21/2018   HDL 58 10/21/2018   LDLCALC 74 10/21/2018   TRIG 175 (H) 10/21/2018   CHOLHDL 2.9 10/21/2018    Lab Results  Component Value Date   HGBA1C 5.5 02/11/2018   Lab Results  Component Value Date   VITAMINB12 407 01/10/2008    ASSESSMENT AND PLAN 79 y.o. year old female  has a past medical history of Abnormality of gait (04/10/2015), Cervical spondylosis without myelopathy (07/18/2013), Common migraine with intractable migraine (04/10/2015), DJD (degenerative joint disease), Fibromyalgia, Hiatal hernia, and Migraine headache. here with:  1.  Fibromyalgia 2.  Migraine headache  She continues to do well.  She will remain on Topamax 25 mg in the morning, 75 mg at bedtime for headache, wants to stay at current dose.  She will continue taking Lyrica 25 mg 3 times a day.  She thinks she does not need refills at this time for Lyrica.  She will follow-up in 1 year or sooner if needed.   I spent 15 minutes with the patient. 50% of this time was spent discussing her plan of care.   Margie Ege, AGNP-C, DNP 05/17/2019, 11:13 AM [facility] [address], [address] [place], [place] [address] [phone]

## 2019-05-17 ENCOUNTER — Ambulatory Visit: Payer: Medicare Other | Admitting: Neurology

## 2019-05-17 ENCOUNTER — Other Ambulatory Visit: Payer: Self-pay

## 2019-05-17 ENCOUNTER — Telehealth: Payer: Self-pay | Admitting: Neurology

## 2019-05-17 ENCOUNTER — Encounter: Payer: Self-pay | Admitting: Neurology

## 2019-05-17 VITALS — BP 110/64 | HR 72 | Ht 64.0 in | Wt 165.0 lb

## 2019-05-17 DIAGNOSIS — M797 Fibromyalgia: Secondary | ICD-10-CM

## 2019-05-17 DIAGNOSIS — R519 Headache, unspecified: Secondary | ICD-10-CM

## 2019-05-17 MED ORDER — TOPIRAMATE 25 MG PO TABS: ORAL_TABLET | ORAL | 3 refills | Status: DC

## 2019-05-17 NOTE — Telephone Encounter (Signed)
Pt called wanting to inform the provider that she has enough pregabalin (LYRICA) 25 MG capsule to last her until the 1st of April. Please advise.

## 2019-05-17 NOTE — Patient Instructions (Signed)
It was great to see you today :) Continue taking Lyrica and Topamax.  Let me know if you need Lyrica See you back in 1 year or sooner if needed

## 2019-05-18 ENCOUNTER — Encounter: Payer: Self-pay | Admitting: Nurse Practitioner

## 2019-05-18 MED ORDER — PREGABALIN 25 MG PO CAPS: ORAL_CAPSULE | ORAL | 0 refills | Status: DC

## 2019-05-18 NOTE — Telephone Encounter (Signed)
Looks like she is due for refill. Will send to her pharmacy, was last filled 12/28/2018 270 tablets, for 90 days.

## 2019-05-18 NOTE — Addendum Note (Signed)
Addended by: Glean Salvo on: 05/18/2019 07:44 AM   Modules accepted: Orders

## 2019-05-19 NOTE — Progress Notes (Signed)
I have read the note, and I agree with the clinical assessment and plan.  Muaaz Brau K Shauniece Kwan   

## 2019-06-28 ENCOUNTER — Other Ambulatory Visit: Payer: Self-pay

## 2019-06-28 ENCOUNTER — Encounter: Payer: Self-pay | Admitting: Nurse Practitioner

## 2019-06-28 ENCOUNTER — Ambulatory Visit (INDEPENDENT_AMBULATORY_CARE_PROVIDER_SITE_OTHER): Payer: Medicare Other | Admitting: Nurse Practitioner

## 2019-06-28 VITALS — BP 114/78 | HR 60 | Temp 98.4°F | Ht 63.2 in | Wt 161.2 lb

## 2019-06-28 DIAGNOSIS — E559 Vitamin D deficiency, unspecified: Secondary | ICD-10-CM | POA: Diagnosis not present

## 2019-06-28 DIAGNOSIS — E7800 Pure hypercholesterolemia, unspecified: Secondary | ICD-10-CM | POA: Diagnosis not present

## 2019-06-28 DIAGNOSIS — M797 Fibromyalgia: Secondary | ICD-10-CM

## 2019-06-28 DIAGNOSIS — J302 Other seasonal allergic rhinitis: Secondary | ICD-10-CM

## 2019-06-28 DIAGNOSIS — F3341 Major depressive disorder, recurrent, in partial remission: Secondary | ICD-10-CM | POA: Diagnosis not present

## 2019-06-28 LAB — CMP14+EGFR
ALT: 7 IU/L (ref 0–32)
AST: 13 IU/L (ref 0–40)
Albumin/Globulin Ratio: 2.2 (ref 1.2–2.2)
Albumin: 4.2 g/dL (ref 3.7–4.7)
Alkaline Phosphatase: 84 IU/L (ref 39–117)
BUN/Creatinine Ratio: 18 (ref 12–28)
BUN: 16 mg/dL (ref 8–27)
Bilirubin Total: 0.3 mg/dL (ref 0.0–1.2)
CO2: 22 mmol/L (ref 20–29)
Calcium: 9.5 mg/dL (ref 8.7–10.3)
Chloride: 106 mmol/L (ref 96–106)
Creatinine, Ser: 0.88 mg/dL (ref 0.57–1.00)
GFR calc Af Amer: 73 mL/min/{1.73_m2} (ref >59)
GFR calc non Af Amer: 64 mL/min/{1.73_m2} (ref >59)
Globulin, Total: 1.9 g/dL (ref 1.5–4.5)
Glucose: 91 mg/dL (ref 65–99)
Potassium: 4.5 mmol/L (ref 3.5–5.2)
Sodium: 143 mmol/L (ref 134–144)
Total Protein: 6.1 g/dL (ref 6.0–8.5)

## 2019-06-28 LAB — LIPID PANEL
Chol/HDL Ratio: 3 ratio (ref 0.0–4.4)
Cholesterol, Total: 136 mg/dL (ref 100–199)
HDL: 45 mg/dL (ref >39)
LDL Chol Calc (NIH): 69 mg/dL (ref 0–99)
Triglycerides: 125 mg/dL (ref 0–149)
VLDL Cholesterol Cal: 22 mg/dL (ref 5–40)

## 2019-06-28 NOTE — Progress Notes (Signed)
This visit occurred during the SARS-CoV-2 public health emergency.  Safety protocols were in place, including screening questions prior to the visit, additional usage of staff PPE, and extensive cleaning of exam room while observing appropriate contact time as indicated for disinfecting solutions.  Subjective:     Patient ID: [name] , female    DOB: 1940/09/23 , 79 y.o.   MRN: [id number]   Chief Complaint  Patient presents with  . Depression  . vitamin d    HPI  She had her first Moderna vaccine yesterday had sore arm.  Depression        This is a chronic problem.  The current episode started more than 1 year ago.   The onset quality is gradual.   The problem occurs constantly (states "depends on the weather").  Associated symptoms include no decreased concentration, no fatigue and no headaches.     The symptoms are aggravated by nothing.  Past treatments include SSRIs - Selective serotonin reuptake inhibitors.  Previous treatment provided no relief relief.   Pertinent negatives include no chronic fatigue syndrome.    Past Medical History:  Diagnosis Date  . Abnormality of gait 04/10/2015  . Cervical spondylosis without myelopathy 07/18/2013  . Common migraine with intractable migraine 04/10/2015  . DJD (degenerative joint disease)   . Fibromyalgia   . Hiatal hernia   . Migraine headache      Family History  Problem Relation Age of Onset  . CVA Mother   . Stroke Father   . Emphysema Father      Current Outpatient Medications:  .  acetaminophen (TYLENOL) 500 MG tablet, Take 1,000 mg by mouth every 6 (six) hours as needed., Disp: , Rfl:  .  Boswellia-Glucosamine-Vit D (OSTEO BI-FLEX ONE PER DAY PO), Take by mouth., Disp: , Rfl:  .  Calcium Carbonate-Vit D-Min (CALCIUM 1200 PO), Take 1 capsule by mouth daily., Disp: , Rfl:  .  cetirizine (ZYRTEC) 10 MG tablet, Take 10 mg by mouth daily., Disp: , Rfl:  .  Cholecalciferol (VITAMIN D) 2000 units tablet, Take 2,000 Units  by mouth daily., Disp: , Rfl:  .  clopidogrel (PLAVIX) 75 MG tablet, Take 1 tablet (75 mg total) by mouth daily., Disp: 90 tablet, Rfl: 1 .  fluticasone (FLONASE) 50 MCG/ACT nasal spray, Place into both nostrils daily as needed for allergies or rhinitis., Disp: , Rfl:  .  Omega-3 Fatty Acids (FISH OIL PO), Take 1 capsule by mouth daily. Takes it sometimes, Disp: , Rfl:  .  pregabalin (LYRICA) 25 MG capsule, TAKE 1 CAPSULE BY MOUTH THREE TIMES A DAY, Disp: 270 capsule, Rfl: 0 .  sertraline (ZOLOFT) 25 MG tablet, TAKE 1 TABLET BY MOUTH EVERY DAY, Disp: 90 tablet, Rfl: 1 .  topiramate (TOPAMAX) 25 MG tablet, Take one tablet in the morning and 3 in the evening, Disp: 360 tablet, Rfl: 3 .  vitamin C (ASCORBIC ACID) 250 MG tablet, Take 250 mg by mouth daily. , Disp: , Rfl:    Allergies  Allergen Reactions  . Aspirin Anaphylaxis and Hives  . Elavil [Amitriptyline] Anaphylaxis  . Latex   . Relafen [Nabumetone] Hives     Review of Systems  Constitutional: Negative for fatigue.  Respiratory: Negative.  Negative for cough and shortness of breath.   Cardiovascular: Negative.  Negative for chest pain, palpitations and leg swelling.  Genitourinary: Negative.   Neurological: Negative for dizziness and headaches.  Psychiatric/Behavioral: Positive for depression. Negative for decreased concentration.  Today's Vitals   06/28/19 0934  BP: 114/78  Pulse: 60  Temp: 98.4 F (36.9 C)  TempSrc: Oral  Weight: 161 lb 3.2 oz (73.1 kg)  Height: 5' 3.2" (1.605 m)  PainSc: 0-No pain   Body mass index is 28.37 kg/m.   Objective:  Physical Exam Constitutional:      Appearance: Normal appearance.  Cardiovascular:     Rate and Rhythm: Normal rate and regular rhythm.  Pulmonary:     Effort: Pulmonary effort is normal. No respiratory distress.     Breath sounds: Normal breath sounds.  Skin:    Capillary Refill: Capillary refill takes less than 2 seconds.  Neurological:     General: No focal  deficit present.     Mental Status: She is alert and oriented to person, place, and time.  Psychiatric:        Mood and Affect: Mood normal.        Behavior: Behavior normal.        Thought Content: Thought content normal.        Judgment: Judgment normal.         Assessment And Plan:     1. Recurrent major depressive disorder, in partial remission (HCC)  Chronic, currently controlled.  Continue with current medications  Denies needing a refill at this time.   2. Vitamin D deficiency  Normal at last visit, will not recheck levels today.     Also encouraged to spend 15 minutes in the sun daily.   3. Seasonal allergies  She is taking an over the counter antihistamine daily which is helpful  4. Elevated cholesterol  Slight elevation in LDL   Encouraged to avoid fried and fatty foods  Will check lipid panel - Lipid panel  5. Fibromyalgia  Chronic, she is being followed by neurology with her most recent visit 05/2019   No changes to her medications were made. - CMP14+EGFR      [name], FNP    THE PATIENT IS ENCOURAGED TO PRACTICE SOCIAL DISTANCING DUE TO THE COVID-19 PANDEMIC.

## 2019-08-19 ENCOUNTER — Other Ambulatory Visit: Payer: Self-pay | Admitting: Nurse Practitioner

## 2019-08-19 DIAGNOSIS — Z8673 Personal history of transient ischemic attack (TIA), and cerebral infarction without residual deficits: Secondary | ICD-10-CM

## 2019-08-24 DIAGNOSIS — M5136 Other intervertebral disc degeneration, lumbar region: Secondary | ICD-10-CM | POA: Diagnosis not present

## 2019-08-24 DIAGNOSIS — M50322 Other cervical disc degeneration at C5-C6 level: Secondary | ICD-10-CM | POA: Diagnosis not present

## 2019-08-24 DIAGNOSIS — M9903 Segmental and somatic dysfunction of lumbar region: Secondary | ICD-10-CM | POA: Diagnosis not present

## 2019-08-24 DIAGNOSIS — M9901 Segmental and somatic dysfunction of cervical region: Secondary | ICD-10-CM | POA: Diagnosis not present

## 2019-08-29 DIAGNOSIS — M9901 Segmental and somatic dysfunction of cervical region: Secondary | ICD-10-CM | POA: Diagnosis not present

## 2019-08-29 DIAGNOSIS — M5136 Other intervertebral disc degeneration, lumbar region: Secondary | ICD-10-CM | POA: Diagnosis not present

## 2019-08-29 DIAGNOSIS — M50322 Other cervical disc degeneration at C5-C6 level: Secondary | ICD-10-CM | POA: Diagnosis not present

## 2019-08-29 DIAGNOSIS — M9903 Segmental and somatic dysfunction of lumbar region: Secondary | ICD-10-CM | POA: Diagnosis not present

## 2019-08-31 ENCOUNTER — Ambulatory Visit: Payer: Medicare Other

## 2019-08-31 ENCOUNTER — Ambulatory Visit: Payer: Medicare Other | Admitting: Nurse Practitioner

## 2019-09-01 DIAGNOSIS — M9903 Segmental and somatic dysfunction of lumbar region: Secondary | ICD-10-CM | POA: Diagnosis not present

## 2019-09-01 DIAGNOSIS — M50322 Other cervical disc degeneration at C5-C6 level: Secondary | ICD-10-CM | POA: Diagnosis not present

## 2019-09-01 DIAGNOSIS — M5136 Other intervertebral disc degeneration, lumbar region: Secondary | ICD-10-CM | POA: Diagnosis not present

## 2019-09-01 DIAGNOSIS — M9901 Segmental and somatic dysfunction of cervical region: Secondary | ICD-10-CM | POA: Diagnosis not present

## 2019-09-06 DIAGNOSIS — M50322 Other cervical disc degeneration at C5-C6 level: Secondary | ICD-10-CM | POA: Diagnosis not present

## 2019-09-06 DIAGNOSIS — M9903 Segmental and somatic dysfunction of lumbar region: Secondary | ICD-10-CM | POA: Diagnosis not present

## 2019-09-06 DIAGNOSIS — M5136 Other intervertebral disc degeneration, lumbar region: Secondary | ICD-10-CM | POA: Diagnosis not present

## 2019-09-06 DIAGNOSIS — M9901 Segmental and somatic dysfunction of cervical region: Secondary | ICD-10-CM | POA: Diagnosis not present

## 2019-09-14 DIAGNOSIS — M9903 Segmental and somatic dysfunction of lumbar region: Secondary | ICD-10-CM | POA: Diagnosis not present

## 2019-09-14 DIAGNOSIS — M9901 Segmental and somatic dysfunction of cervical region: Secondary | ICD-10-CM | POA: Diagnosis not present

## 2019-09-14 DIAGNOSIS — M5136 Other intervertebral disc degeneration, lumbar region: Secondary | ICD-10-CM | POA: Diagnosis not present

## 2019-09-14 DIAGNOSIS — M50322 Other cervical disc degeneration at C5-C6 level: Secondary | ICD-10-CM | POA: Diagnosis not present

## 2019-09-21 DIAGNOSIS — M9901 Segmental and somatic dysfunction of cervical region: Secondary | ICD-10-CM | POA: Diagnosis not present

## 2019-09-21 DIAGNOSIS — M9903 Segmental and somatic dysfunction of lumbar region: Secondary | ICD-10-CM | POA: Diagnosis not present

## 2019-09-21 DIAGNOSIS — M50322 Other cervical disc degeneration at C5-C6 level: Secondary | ICD-10-CM | POA: Diagnosis not present

## 2019-09-21 DIAGNOSIS — M5136 Other intervertebral disc degeneration, lumbar region: Secondary | ICD-10-CM | POA: Diagnosis not present

## 2019-09-28 DIAGNOSIS — M9901 Segmental and somatic dysfunction of cervical region: Secondary | ICD-10-CM | POA: Diagnosis not present

## 2019-09-28 DIAGNOSIS — M9903 Segmental and somatic dysfunction of lumbar region: Secondary | ICD-10-CM | POA: Diagnosis not present

## 2019-09-28 DIAGNOSIS — M50322 Other cervical disc degeneration at C5-C6 level: Secondary | ICD-10-CM | POA: Diagnosis not present

## 2019-09-28 DIAGNOSIS — M5136 Other intervertebral disc degeneration, lumbar region: Secondary | ICD-10-CM | POA: Diagnosis not present

## 2019-10-04 ENCOUNTER — Other Ambulatory Visit: Payer: Self-pay | Admitting: Nurse Practitioner

## 2019-10-04 DIAGNOSIS — F3341 Major depressive disorder, recurrent, in partial remission: Secondary | ICD-10-CM

## 2019-10-05 DIAGNOSIS — M5136 Other intervertebral disc degeneration, lumbar region: Secondary | ICD-10-CM | POA: Diagnosis not present

## 2019-10-05 DIAGNOSIS — M9903 Segmental and somatic dysfunction of lumbar region: Secondary | ICD-10-CM | POA: Diagnosis not present

## 2019-10-05 DIAGNOSIS — M50322 Other cervical disc degeneration at C5-C6 level: Secondary | ICD-10-CM | POA: Diagnosis not present

## 2019-10-05 DIAGNOSIS — M9901 Segmental and somatic dysfunction of cervical region: Secondary | ICD-10-CM | POA: Diagnosis not present

## 2019-10-12 DIAGNOSIS — M50322 Other cervical disc degeneration at C5-C6 level: Secondary | ICD-10-CM | POA: Diagnosis not present

## 2019-10-12 DIAGNOSIS — M9903 Segmental and somatic dysfunction of lumbar region: Secondary | ICD-10-CM | POA: Diagnosis not present

## 2019-10-12 DIAGNOSIS — M5136 Other intervertebral disc degeneration, lumbar region: Secondary | ICD-10-CM | POA: Diagnosis not present

## 2019-10-12 DIAGNOSIS — M9901 Segmental and somatic dysfunction of cervical region: Secondary | ICD-10-CM | POA: Diagnosis not present

## 2019-10-14 ENCOUNTER — Other Ambulatory Visit: Payer: Self-pay | Admitting: Neurology

## 2019-10-17 DIAGNOSIS — H16223 Keratoconjunctivitis sicca, not specified as Sjogren's, bilateral: Secondary | ICD-10-CM | POA: Diagnosis not present

## 2019-10-17 DIAGNOSIS — H25813 Combined forms of age-related cataract, bilateral: Secondary | ICD-10-CM | POA: Diagnosis not present

## 2019-10-17 NOTE — Telephone Encounter (Signed)
Received a request for Lyrica 25mg . Patient was last seen on 05/17/19 and was advised to follow up in 1 yr. Last RX was sent on 05/18/19 for 270 capsules. [facility] has been reviewed.   Sarah, please advise if this refill is appropriate. Thanks!

## 2019-10-26 ENCOUNTER — Encounter (INDEPENDENT_AMBULATORY_CARE_PROVIDER_SITE_OTHER): Payer: Self-pay | Admitting: Ophthalmology

## 2019-10-26 ENCOUNTER — Ambulatory Visit (INDEPENDENT_AMBULATORY_CARE_PROVIDER_SITE_OTHER): Payer: Medicare Other | Admitting: Ophthalmology

## 2019-10-26 ENCOUNTER — Other Ambulatory Visit: Payer: Self-pay

## 2019-10-26 DIAGNOSIS — H35721 Serous detachment of retinal pigment epithelium, right eye: Secondary | ICD-10-CM

## 2019-10-26 DIAGNOSIS — H353211 Exudative age-related macular degeneration, right eye, with active choroidal neovascularization: Secondary | ICD-10-CM

## 2019-10-26 DIAGNOSIS — H353132 Nonexudative age-related macular degeneration, bilateral, intermediate dry stage: Secondary | ICD-10-CM

## 2019-10-26 MED ORDER — BEVACIZUMAB CHEMO INJECTION 1.25MG/0.05ML SYRINGE FOR KALEIDOSCOPE
1.2500 mg | INTRAVITREAL | Status: AC | PRN
  Administered 2019-10-26: 1.25 mg via INTRAVITREAL

## 2019-10-26 NOTE — Progress Notes (Signed)
10/26/2019     CHIEF COMPLAINT Patient presents for Retina Evaluation   HISTORY OF PRESENT ILLNESS: [name] is a 79 y.o. female who presents to the clinic today for:   Vision change and loss and distortion right eye, possible wet ARMD  HPI    Retina Evaluation    In both eyes.  This started 1 year ago.  Duration of 1 year.  Associated Symptoms: Dryness.  Context:  mid-range vision, distance vision and near vision.  Treatments tried include eye drops.  Response to treatment was mild improvement.  I, the attending physician,  performed the HPI with the patient and updated documentation appropriately.          Comments    AMD Eval OU - Ref'd by Dr. Fabian Sharp, new onset vision loss in the right eye with acuity distortion  Pt c/o dryness OU x 1 year. Pt sts cataracts are getting worse. No distortion OU.       Last edited by Edmon Crape, MD on 10/26/2019 10:46 AM. (History)      Referring physician: Arnette Felts, FNP [address] [place],  [place] [address]  HISTORICAL INFORMATION:   Selected notes from the MEDICAL RECORD NUMBER    Lab Results  Component Value Date   HGBA1C 5.5 02/11/2018     CURRENT MEDICATIONS: No current outpatient medications on file. (Ophthalmic Drugs)   No current facility-administered medications for this visit. (Ophthalmic Drugs)   Current Outpatient Medications (Other)  Medication Sig  . acetaminophen (TYLENOL) 500 MG tablet Take 1,000 mg by mouth every 6 (six) hours as needed.  . Boswellia-Glucosamine-Vit D (OSTEO BI-FLEX ONE PER DAY PO) Take by mouth.  . Calcium Carbonate-Vit D-Min (CALCIUM 1200 PO) Take 1 capsule by mouth daily.  . cetirizine (ZYRTEC) 10 MG tablet Take 10 mg by mouth daily.  . Cholecalciferol (VITAMIN D) 2000 units tablet Take 2,000 Units by mouth daily.  . clopidogrel (PLAVIX) 75 MG tablet TAKE 1 TABLET BY MOUTH EVERY DAY  . fluticasone (FLONASE) 50 MCG/ACT nasal spray Place into both nostrils  daily as needed for allergies or rhinitis.  . Omega-3 Fatty Acids (FISH OIL PO) Take 1 capsule by mouth daily. Takes it sometimes  . pregabalin (LYRICA) 25 MG capsule TAKE 1 CAPSULE BY MOUTH THREE TIMES A DAY  . sertraline (ZOLOFT) 25 MG tablet TAKE 1 TABLET BY MOUTH EVERY DAY  . topiramate (TOPAMAX) 25 MG tablet Take one tablet in the morning and 3 in the evening  . vitamin C (ASCORBIC ACID) 250 MG tablet Take 250 mg by mouth daily.    No current facility-administered medications for this visit. (Other)      REVIEW OF SYSTEMS:    ALLERGIES Allergies  Allergen Reactions  . Aspirin Anaphylaxis and Hives  . Elavil [Amitriptyline] Anaphylaxis  . Latex   . Relafen [Nabumetone] Hives    PAST MEDICAL HISTORY Past Medical History:  Diagnosis Date  . Abnormality of gait 04/10/2015  . Cervical spondylosis without myelopathy 07/18/2013  . Common migraine with intractable migraine 04/10/2015  . DJD (degenerative joint disease)   . Fibromyalgia   . Hiatal hernia   . Migraine headache    Past Surgical History:  Procedure Laterality Date  . ABDOMINAL HYSTERECTOMY    . DILATION AND CURETTAGE OF UTERUS    . HIATAL HERNIA REPAIR    . TONSILLECTOMY      FAMILY HISTORY Family History  Problem Relation Age of Onset  .  CVA Mother   . Stroke Father   . Emphysema Father     SOCIAL HISTORY Social History   Tobacco Use  . Smoking status: Never Smoker  . Smokeless tobacco: Never Used  Vaping Use  . Vaping Use: Never used  Substance Use Topics  . Alcohol use: No    Comment: very occasional  . Drug use: No         OPHTHALMIC EXAM:  Base Eye Exam    Visual Acuity (ETDRS)      Right Left   Dist cc 20/50 +2 20/25 +1   Dist ph cc NI    Correction: Glasses       Tonometry (Tonopen, 9:27 AM)      Right Left   Pressure 12 12       Pupils      Pupils Dark Light Shape React APD   Right PERRL 5 4 Round Brisk None   Left PERRL 5 4 Round Brisk None       Visual Fields  (Counting fingers)      Left Right    Full Full       Extraocular Movement      Right Left    Full Full       Neuro/Psych    Oriented x3: Yes   Mood/Affect: Normal       Dilation    Both eyes:         Slit Lamp and Fundus Exam    External Exam      Right Left   External Normal Normal       Slit Lamp Exam      Right Left   Lids/Lashes Normal Normal   Conjunctiva/Sclera White and quiet White and quiet   Cornea Clear Clear   Anterior Chamber Deep and quiet Deep and quiet   Iris Round and reactive Round and reactive   Lens 2+ Nuclear sclerosis 2+ Nuclear sclerosis   Anterior Vitreous Normal Normal       Fundus Exam      Right Left   Posterior Vitreous Central vitreous floaters Central vitreous floaters   Disc Normal Normal   C/D Ratio 0.2    Macula Macular thickening, Subretinal neovascular membrane, Retinal pigment epithelial mottling, Early age related macular degeneration Early age related macular degeneration, no macular thickening, no exudates   Vessels Normal Normal   Periphery Normal Normal          IMAGING AND PROCEDURES  Imaging and Procedures for 10/26/19  OCT, Retina - OU - Both Eyes       Right Eye Quality was good. Scan locations included subfoveal. Central Foveal Thickness: 326. Progression has no prior data. Findings include abnormal foveal contour, retinal drusen , subretinal fluid, pigment epithelial detachment.   Left Eye Quality was good. Scan locations included subfoveal. Central Foveal Thickness: 265. Progression has no prior data. Findings include retinal drusen , no SRF.   Notes OS, normal foveal contour with incidental posterior vitreous detachment  OD with posterior vitreous detachment meant, pigment epithelial detachment subfoveal with subretinal fluid consistent with wet ARMD active CNVM       Intravitreal Injection, Pharmacologic Agent - OD - Right Eye       Time Out 10/26/2019. 10:47 AM. Confirmed correct patient,  procedure, site, and patient consented.   Anesthesia Anesthetic medications included Akten 3.5%.   Procedure Preparation included Ofloxacin , Tobramycin 0.3%, 10% betadine to eyelids, 5% betadine to ocular surface. A supplied needle was  used.   Injection:  1.25 mg Bevacizumab (AVASTIN) SOLN   NDC: [address]-2, Lot: [id number]   Route: Intravitreal, Site: Right Eye, Waste: 0 mg  Post-op Post injection exam found visual acuity of at least counting fingers. The patient tolerated the procedure well. There were no complications. The patient received written and verbal post procedure care education. Post injection medications were not given.                 ASSESSMENT/PLAN:  Exudative age-related macular degeneration of right eye with active choroidal neovascularization (HCC) The nature of wet macular degeneration was discussed with the patient.  Forms of therapy reviewed include the use of Anti-VEGF medications injected painlessly into the eye, as well as other possible treatment modalities, including thermal laser therapy. Fellow eye involvement and risks were discussed with the patient. Upon the finding of wet age related macular degeneration, treatment will be offered. The treatment regimen is on a treat as needed basis with the intent to treat if necessary and extend interval of exams when possible. On average 1 out of 6 patients do not need lifetime therapy. However, the risk of recurrent disease is high for a lifetime.  Initially monthly, then periodic, examinations and evaluations will determine whether the next treatment is required on the day of the examination.      ICD-10-CM   1. Exudative age-related macular degeneration of right eye with active choroidal neovascularization (HCC)  H35.3211 OCT, Retina - OU - Both Eyes    Intravitreal Injection, Pharmacologic Agent - OD - Right Eye    Bevacizumab (AVASTIN) SOLN 1.25 mg  2. 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 Intermediate stage nonexudative age-related macular degeneration of both eyes  H35.3132 OCT, Retina - OU - Both Eyes    1.  We will commence with treatment via intravitreal Avastin today and examination in 4 to 5 weeks OD  2.  3.  Ophthalmic Meds Ordered this visit:  Meds ordered this encounter  Medications  . Bevacizumab (AVASTIN) SOLN 1.25 mg       Return in about 5 weeks (around 11/30/2019) for dilate, OD, AVASTIN OCT.  Patient Instructions  The nature of age realated macular degeneration (ARMD)is explained as follows: The dry form refers to the progressive loss of normal blood supply to the central vision as a result of a combination of factors which include aging blood supply (arteriosclerosis, hardening of the arteries), genetics, smoking habits, and history of hypertension. Currently, no eye medications or vitamins slow this type of aging effect upon vision, however cessation of smoking and controlling hypertension help slow the disorder. The following analogy helps explain this: I describe the dry form of ARMD like a house of the same age as your eyes, which shows typical wear and tear of age upon the house structure and appearance. Like the aging house which can fall down structurally, the dry form of ARMD can deteriorate the structure of the macula (center) of the retina, most often gradually and affect central vision tasks such as reading and driving. The wet form of ARMD refers to the development of abnormally growing blood vessels usually near or under the central vision, with potential risk of permanent visual changes or vision losses. This complication of the Dry form of ARMD may be moderately reduced by use of AREDS 2 formula multivitamins daily. I describe the Wet form of ARMD as like the development of a fire in  an aging house (DRY ARMD). It may develop suddenly, progress rapidly and be destructive based on where it starts and how big the  fire is when found. In the eye, the house fire analogy refers to the abnormal blood vessels growing destructively near the central vison in the retina, or film of the eye. Halting the growth of blood vessels with laser (hot or cold) or injectable medications is best way "to put the fire out". Many patients will experience a stabilization or even improvement in vision with a treatment course, while others may still face a loss of vision. The use of injectable medications has revolutionized therapy and is currently the only proven therapy to provide the chance of stable or improved acuity from new and recent destructive wet ARMD.  Patient to report new onset visual distortion or change beyond current status in either eye    Explained the diagnoses, plan, and follow up with the patient and they expressed understanding.  Patient expressed understanding of the importance of proper follow up care.   Alford Highland Bobie Kistler M.D. Diseases & Surgery of the Retina and Vitreous [facility] 10/26/19     Abbreviations: M myopia (nearsighted); A astigmatism; H hyperopia (farsighted); P presbyopia; Mrx spectacle prescription;  CTL contact lenses; OD right eye; OS left eye; OU both eyes  XT exotropia; ET esotropia; PEK punctate epithelial keratitis; PEE punctate epithelial erosions; DES dry eye syndrome; MGD meibomian gland dysfunction; ATs artificial tears; PFAT's preservative free artificial tears; NSC nuclear sclerotic cataract; PSC posterior subcapsular cataract; ERM epi-retinal membrane; PVD posterior vitreous detachment; RD retinal detachment; DM diabetes mellitus; DR diabetic retinopathy; NPDR non-proliferative diabetic retinopathy; PDR proliferative diabetic retinopathy; CSME clinically significant macular edema; DME diabetic macular edema; dbh dot blot hemorrhages; CWS cotton wool spot; POAG primary open angle glaucoma; C/D cup-to-disc ratio; HVF humphrey visual field; GVF goldmann visual field;  OCT optical coherence tomography; IOP intraocular pressure; BRVO Branch retinal vein occlusion; CRVO central retinal vein occlusion; CRAO central retinal artery occlusion; BRAO branch retinal artery occlusion; RT retinal tear; SB scleral buckle; PPV pars plana vitrectomy; VH Vitreous hemorrhage; PRP panretinal laser photocoagulation; IVK intravitreal kenalog; VMT vitreomacular traction; MH Macular hole;  NVD neovascularization of the disc; NVE neovascularization elsewhere; AREDS age related eye disease study; ARMD age related macular degeneration; POAG primary open angle glaucoma; EBMD epithelial/anterior basement membrane dystrophy; ACIOL anterior chamber intraocular lens; IOL intraocular lens; PCIOL posterior chamber intraocular lens; Phaco/IOL phacoemulsification with intraocular lens placement; PRK photorefractive keratectomy; LASIK laser assisted in situ keratomileusis; HTN hypertension; DM diabetes mellitus; COPD chronic obstructive pulmonary disease

## 2019-10-26 NOTE — Patient Instructions (Signed)
The nature of age realated macular degeneration (ARMD)is explained as follows: The dry form refers to the progressive loss of normal blood supply to the central vision as a result of a combination of factors which include aging blood supply (arteriosclerosis, hardening of the arteries), genetics, smoking habits, and history of hypertension. Currently, no eye medications or vitamins slow this type of aging effect upon vision, however cessation of smoking and controlling hypertension help slow the disorder. The following analogy helps explain this: I describe the dry form of ARMD like a house of the same age as your eyes, which shows typical wear and tear of age upon the house structure and appearance. Like the aging house which can fall down structurally, the dry form of ARMD can deteriorate the structure of the macula (center) of the retina, most often gradually and affect central vision tasks such as reading and driving. The wet form of ARMD refers to the development of abnormally growing blood vessels usually near or under the central vision, with potential risk of permanent visual changes or vision losses. This complication of the Dry form of ARMD may be moderately reduced by use of AREDS 2 formula multivitamins daily. I describe the Wet form of ARMD as like the development of a fire in an aging house (DRY ARMD). It may develop suddenly, progress rapidly and be destructive based on where it starts and how big the fire is when found. In the eye, the house fire analogy refers to the abnormal blood vessels growing destructively near the central vison in the retina, or film of the eye. Halting the growth of blood vessels with laser (hot or cold) or injectable medications is best way "to put the fire out". Many patients will experience a stabilization or even improvement in vision with a treatment course, while others may still face a loss of vision. The use of injectable medications has revolutionized therapy and is  currently the only proven therapy to provide the chance of stable or improved acuity from new and recent destructive wet ARMD.  Patient to report new onset visual distortion or change beyond current status in either eye

## 2019-10-26 NOTE — Assessment & Plan Note (Signed)

## 2019-10-27 ENCOUNTER — Ambulatory Visit (INDEPENDENT_AMBULATORY_CARE_PROVIDER_SITE_OTHER): Payer: Medicare Other | Admitting: Nurse Practitioner

## 2019-10-27 ENCOUNTER — Encounter: Payer: Self-pay | Admitting: Nurse Practitioner

## 2019-10-27 VITALS — BP 116/68 | HR 73 | Temp 98.2°F | Ht 63.2 in | Wt 159.8 lb

## 2019-10-27 DIAGNOSIS — R7309 Other abnormal glucose: Secondary | ICD-10-CM

## 2019-10-27 DIAGNOSIS — E7800 Pure hypercholesterolemia, unspecified: Secondary | ICD-10-CM | POA: Diagnosis not present

## 2019-10-27 DIAGNOSIS — H6121 Impacted cerumen, right ear: Secondary | ICD-10-CM

## 2019-10-27 DIAGNOSIS — E559 Vitamin D deficiency, unspecified: Secondary | ICD-10-CM | POA: Diagnosis not present

## 2019-10-27 DIAGNOSIS — R1084 Generalized abdominal pain: Secondary | ICD-10-CM

## 2019-10-27 DIAGNOSIS — Z1159 Encounter for screening for other viral diseases: Secondary | ICD-10-CM

## 2019-10-27 DIAGNOSIS — Z0000 Encounter for general adult medical examination without abnormal findings: Secondary | ICD-10-CM | POA: Diagnosis not present

## 2019-10-27 DIAGNOSIS — H353211 Exudative age-related macular degeneration, right eye, with active choroidal neovascularization: Secondary | ICD-10-CM

## 2019-10-27 MED ORDER — PNEUMOCOCCAL 13-VAL CONJ VACC IM SUSP: 0.5000 mL | INTRAMUSCULAR | 0 refills | Status: AC

## 2019-10-27 NOTE — Progress Notes (Signed)
I,Yamilka Roman [employer] as a Neurosurgeon for [employer], FNP.,have documented all relevant documentation on the behalf of Arnette Felts, FNP,as directed by  Arnette Felts, FNP while in the presence of Arnette Felts, FNP.  This visit occurred during the SARS-CoV-2 public health emergency.  Safety protocols were in place, including screening questions prior to the visit, additional usage of staff PPE, and extensive cleaning of exam room while observing appropriate contact time as indicated for disinfecting solutions.  Subjective:     Patient ID: [name] , female    DOB: 17-Jan-1941 , 79 y.o.   MRN: [id number]   Chief Complaint  Patient presents with  . Annual Exam    HPI  Here for HM.   She went to the ophthalmologist yesterday with Dr. Luciana Axe on [address].  She is getting injections to her right eye. She is having eye surgery on her eyes on August 25th.   Abdominal Pain This is a chronic problem. The current episode started more than 1 year ago. The onset quality is gradual. The problem occurs constantly. The pain is located in the generalized abdominal region. The pain is mild. The quality of the pain is aching. The abdominal pain radiates to the RUQ. Associated symptoms include constipation. Pertinent negatives include no anorexia, belching or nausea. The pain is relieved by nothing. She has tried nothing for the symptoms.     Past Medical History:  Diagnosis Date  . Abnormality of gait 04/10/2015  . Cervical spondylosis without myelopathy 07/18/2013  . Common migraine with intractable migraine 04/10/2015  . DJD (degenerative joint disease)   . Fibromyalgia   . Hiatal hernia   . Migraine headache      Family History  Problem Relation Age of Onset  . CVA Mother   . Stroke Father   . Emphysema Father      Current Outpatient Medications:  .  acetaminophen (TYLENOL) 500 MG tablet, Take 1,000 mg by mouth every 6 (six) hours as needed., Disp: , Rfl:  .   Boswellia-Glucosamine-Vit D (OSTEO BI-FLEX ONE PER DAY PO), Take by mouth., Disp: , Rfl:  .  Calcium Carbonate-Vit D-Min (CALCIUM 1200 PO), Take 1 capsule by mouth daily., Disp: , Rfl:  .  cetirizine (ZYRTEC) 10 MG tablet, Take 10 mg by mouth daily., Disp: , Rfl:  .  Cholecalciferol (VITAMIN D) 2000 units tablet, Take 2,000 Units by mouth daily., Disp: , Rfl:  .  clopidogrel (PLAVIX) 75 MG tablet, TAKE 1 TABLET BY MOUTH EVERY DAY, Disp: 90 tablet, Rfl: 1 .  fluticasone (FLONASE) 50 MCG/ACT nasal spray, Place into both nostrils daily as needed for allergies or rhinitis., Disp: , Rfl:  .  Omega-3 Fatty Acids (FISH OIL PO), Take 1 capsule by mouth daily. Takes it sometimes, Disp: , Rfl:  .  Polyethyl Glycol-Propyl Glycol (SYSTANE OP), Apply to eye. As needed, Disp: , Rfl:  .  pregabalin (LYRICA) 25 MG capsule, TAKE 1 CAPSULE BY MOUTH THREE TIMES A DAY, Disp: 270 capsule, Rfl: 1 .  sertraline (ZOLOFT) 25 MG tablet, TAKE 1 TABLET BY MOUTH EVERY DAY, Disp: 90 tablet, Rfl: 1 .  topiramate (TOPAMAX) 25 MG tablet, Take one tablet in the morning and 3 in the evening, Disp: 360 tablet, Rfl: 3 .  vitamin C (ASCORBIC ACID) 250 MG tablet, Take 250 mg by mouth daily. , Disp: , Rfl:  .  pneumococcal 13-valent conjugate vaccine (PREVNAR 13) SUSP injection, Inject 0.5 mLs into the muscle tomorrow at 10 am for  1 dose., Disp: 0.5 mL, Rfl: 0   Allergies  Allergen Reactions  . Aspirin Anaphylaxis and Hives  . Elavil [Amitriptyline] Anaphylaxis  . Latex   . Relafen [Nabumetone] Hives      The patient states she is  post menopausal status   Negative for Dysmenorrhea and Negative for Menorrhagia. Negative for: breast discharge, breast lump(s), breast pain and breast self exam. Associated symptoms include abnormal vaginal bleeding. Pertinent negatives include abnormal bleeding (hematology), anxiety, decreased libido, depression, difficulty falling sleep, dyspareunia, history of infertility, nocturia, sexual  dysfunction, sleep disturbances, urinary incontinence, urinary urgency, vaginal discharge and vaginal itching. Diet regular, rarely eats beef. The patient states her exercise level is minimal with walking. She will use the weights with her arms, she feels she is able to do better in bed.   The patient's tobacco use is:  Social History   Tobacco Use  Smoking Status Never Smoker  Smokeless Tobacco Never Used  . She has been exposed to passive smoke. The patient's alcohol use is:  Social History   Substance and Sexual Activity  Alcohol Use No   Comment: very occasional    Review of Systems  Constitutional: Negative.   HENT: Negative.   Eyes: Negative.   Respiratory: Negative.   Cardiovascular: Negative.  Negative for chest pain, palpitations and leg swelling.  Gastrointestinal: Positive for abdominal pain and constipation. Negative for anorexia and nausea.  Endocrine: Negative.   Genitourinary: Negative.   Musculoskeletal: Negative.   Skin: Negative.   Allergic/Immunologic: Negative.   Neurological: Negative.   Hematological: Negative.   Psychiatric/Behavioral: Negative.      Today's Vitals   10/27/19 1013  BP: 116/68  Pulse: 73  Temp: 98.2 F (36.8 C)  TempSrc: Oral  Weight: 159 lb 12.8 oz (72.5 kg)  Height: 5' 3.2" (1.605 m)  PainSc: 0-No pain   Body mass index is 28.13 kg/m.   Objective:  Physical Exam Constitutional:      General: She is not in acute distress.    Appearance: Normal appearance. She is well-developed.     Comments: overweight  HENT:     Head: Normocephalic and atraumatic.     Right Ear: Hearing, tympanic membrane, ear canal and external ear normal. There is impacted cerumen (hard cerumen present to canal).     Left Ear: Hearing, tympanic membrane, ear canal and external ear normal. There is no impacted cerumen.     Nose: Nose normal.     Mouth/Throat:     Mouth: Mucous membranes are dry.  Eyes:     General: Lids are normal.     Extraocular  Movements: Extraocular movements intact.     Conjunctiva/sclera: Conjunctivae normal.     Pupils: Pupils are equal, round, and reactive to light.     Funduscopic exam:    Right eye: No papilledema.        Left eye: No papilledema.  Neck:     Thyroid: No thyroid mass.     Vascular: No carotid bruit.  Cardiovascular:     Rate and Rhythm: Normal rate and regular rhythm.     Pulses: Normal pulses.     Heart sounds: Normal heart sounds. No murmur heard.   Pulmonary:     Effort: Pulmonary effort is normal. No respiratory distress.     Breath sounds: Normal breath sounds. No wheezing.  Abdominal:     General: Abdomen is flat. Bowel sounds are normal. There is no distension.     Palpations: Abdomen is  soft.     Tenderness: There is abdominal tenderness (generalized abdominal tenderness).  Genitourinary:    Rectum: Guaiac result negative.  Musculoskeletal:        General: No swelling. Normal range of motion.     Cervical back: Full passive range of motion without pain, normal range of motion and neck supple.     Right lower leg: No edema.     Left lower leg: No edema.  Skin:    General: Skin is warm and dry.     Capillary Refill: Capillary refill takes less than 2 seconds.  Neurological:     General: No focal deficit present.     Mental Status: She is alert and oriented to person, place, and time.     Cranial Nerves: No cranial nerve deficit.     Sensory: No sensory deficit.     Motor: No weakness.  Psychiatric:        Mood and Affect: Mood normal.        Behavior: Behavior normal.        Thought Content: Thought content normal.        Judgment: Judgment normal.         Assessment And Plan:     1. Encounter for general adult medical examination w/o abnormal findings . Behavior modifications discussed and diet history reviewed.   . Pt will continue to exercise regularly and modify diet with low GI, plant based foods and decrease intake of processed foods.  . Recommend intake  of daily multivitamin, Vitamin D, and calcium.  . Recommend for preventive screenings, as well as recommend immunizations that include influenza, TDAP (up to date) - CMP14+EGFR - CBC  2. Encounter for hepatitis C screening test for low risk patient  Will check Hepatitis C screening due to recent recommendations to screen all adults 18 years and older - Hepatitis C antibody  3. Abnormal glucose  Chronic, stable  No current medications  Encouraged to limit intake of sugary foods and drinks  Encouraged to increase physical activity to 150 minutes per week as tolerated - Hemoglobin A1c  4. Vitamin D deficiency  Will check vitamin D level and supplement as needed.     Also encouraged to spend 15 minutes in the sun daily.  - VITAMIN D 25 Hydroxy (Vit-D Deficiency, Fractures)  5. Elevated cholesterol  Chronic, controlled  Continue with current medications, tolerating well - Lipid panel  6. Exudative age-related macular degeneration of right eye with active choroidal neovascularization ([place])  She is being followed by a specialist and receiving injections  She reports she is to have cataract surgery in the upcoming months  7. Generalized abdominal pain  Tenderness throughout her abdomen  This has been ongoing will check abdomen ultrasound - US Abdomen Complete; Future  8. Impacted cerumen of right ear  Water lavage to right ear with good results    Patient was given opportunity to ask questions. Patient verbalized understanding of the plan and was able to repeat key elements of the plan. All questions were answered to their satisfaction.    Teola Bradley, FNP, have reviewed all documentation for this visit. The documentation on 10/27/19 for the exam, diagnosis, procedures, and orders are all accurate and complete.   THE PATIENT IS ENCOURAGED TO PRACTICE SOCIAL DISTANCING DUE TO THE COVID-19 PANDEMIC.

## 2019-10-27 NOTE — Patient Instructions (Addendum)
Health Maintenance, Female Adopting a healthy lifestyle and getting preventive care are important in promoting health and wellness. Ask your health care provider about:  The right schedule for you to have regular tests and exams.  Things you can do on your own to prevent diseases and keep yourself healthy. What should I know about diet, weight, and exercise? Eat a healthy diet   Eat a diet that includes plenty of vegetables, fruits, low-fat dairy products, and lean protein.  Do not eat a lot of foods that are high in solid fats, added sugars, or sodium. Maintain a healthy weight Body mass index (BMI) is used to identify weight problems. It estimates body fat based on height and weight. Your health care provider can help determine your BMI and help you achieve or maintain a healthy weight. Get regular exercise Get regular exercise. This is one of the most important things you can do for your health. Most adults should:  Exercise for at least 150 minutes each week. The exercise should increase your heart rate and make you sweat (moderate-intensity exercise).  Do strengthening exercises at least twice a week. This is in addition to the moderate-intensity exercise.  Spend less time sitting. Even light physical activity can be beneficial. Watch cholesterol and blood lipids Have your blood tested for lipids and cholesterol at 78 years of age, then have this test every 5 years. Have your cholesterol levels checked more often if:  Your lipid or cholesterol levels are high.  You are older than 78 years of age.  You are at high risk for heart disease. What should I know about cancer screening? Depending on your health history and family history, you may need to have cancer screening at various ages. This may include screening for:  Breast cancer.  Cervical cancer.  Colorectal cancer.  Skin cancer.  Lung cancer. What should I know about heart disease, diabetes, and high blood  pressure? Blood pressure and heart disease  High blood pressure causes heart disease and increases the risk of stroke. This is more likely to develop in people who have high blood pressure readings, are of African descent, or are overweight.  Have your blood pressure checked: ? Every 3-5 years if you are 18-39 years of age. ? Every year if you are 40 years old or older. Diabetes Have regular diabetes screenings. This checks your fasting blood sugar level. Have the screening done:  Once every three years after age 40 if you are at a normal weight and have a low risk for diabetes.  More often and at a younger age if you are overweight or have a high risk for diabetes. What should I know about preventing infection? Hepatitis B If you have a higher risk for hepatitis B, you should be screened for this virus. Talk with your health care provider to find out if you are at risk for hepatitis B infection. Hepatitis C Testing is recommended for:  Everyone born from 1945 through 1965.  Anyone with known risk factors for hepatitis C. Sexually transmitted infections (STIs)  Get screened for STIs, including gonorrhea and chlamydia, if: ? You are sexually active and are younger than 78 years of age. ? You are older than 78 years of age and your health care provider tells you that you are at risk for this type of infection. ? Your sexual activity has changed since you were last screened, and you are at increased risk for chlamydia or gonorrhea. Ask your health care provider if   you are at risk.  Ask your health care provider about whether you are at high risk for HIV. Your health care provider may recommend a prescription medicine to help prevent HIV infection. If you choose to take medicine to prevent HIV, you should first get tested for HIV. You should then be tested every 3 months for as long as you are taking the medicine. Pregnancy  If you are about to stop having your period (premenopausal) and  you may become pregnant, seek counseling before you get pregnant.  Take 400 to 800 micrograms (mcg) of folic acid every day if you become pregnant.  Ask for birth control (contraception) if you want to prevent pregnancy. Osteoporosis and menopause Osteoporosis is a disease in which the bones lose minerals and strength with aging. This can result in bone fractures. If you are 64 years old or older, or if you are at risk for osteoporosis and fractures, ask your health care provider if you should:  Be screened for bone loss.  Take a calcium or vitamin D supplement to lower your risk of fractures.  Be given hormone replacement therapy (HRT) to treat symptoms of menopause. Follow these instructions at home: Lifestyle  Do not use any products that contain nicotine or tobacco, such as cigarettes, e-cigarettes, and chewing tobacco. If you need help quitting, ask your health care provider.  Do not use street drugs.  Do not share needles.  Ask your health care provider for help if you need support or information about quitting drugs. Alcohol use  Do not drink alcohol if: ? Your health care provider tells you not to drink. ? You are pregnant, may be pregnant, or are planning to become pregnant.  If you drink alcohol: ? Limit how much you use to 0-1 drink a day. ? Limit intake if you are breastfeeding.  Be aware of how much alcohol is in your drink. In the [place] one drink equals one 12 oz bottle of beer (355 mL), one 5 oz glass of wine (148 mL), or one 1 oz glass of hard liquor (44 mL). General instructions  Schedule regular health, dental, and eye exams.  Stay current with your vaccines.  Tell your health care provider if: ? You often feel depressed. ? You have ever been abused or do not feel safe at home. Summary  Adopting a healthy lifestyle and getting preventive care are important in promoting health and wellness.  Follow your health care provider's instructions about healthy  diet, exercising, and getting tested or screened for diseases.  Follow your health care provider's instructions on monitoring your cholesterol and blood pressure. This information is not intended to replace advice given to you by your health care provider. Make sure you discuss any questions you have with your health care provider. Document Revised: 02/17/2018 Document Reviewed: 02/17/2018 Elsevier Patient Education  2020 [employer]   Earwax Buildup, Adult The ears produce a substance called earwax that helps keep bacteria out of the ear and protects the skin in the ear canal. Occasionally, earwax can build up in the ear and cause discomfort or hearing loss. What increases the risk? This condition is more likely to develop in people who:  Are female.  Are elderly.  Naturally produce more earwax.  Clean their ears often with cotton swabs.  Use earplugs often.  Use in-ear headphones often.  Wear hearing aids.  Have narrow ear canals.  Have earwax that is overly thick or sticky.  Have eczema.  Are dehydrated.  Have excess hair in the ear canal. What are the signs or symptoms? Symptoms of this condition include:  Reduced or muffled hearing.  A feeling of fullness in the ear or feeling that the ear is plugged.  Fluid coming from the ear.  Ear pain.  Ear itch.  Ringing in the ear.  Coughing.  An obvious piece of earwax that can be seen inside the ear canal. How is this diagnosed? This condition may be diagnosed based on:  Your symptoms.  Your medical history.  An ear exam. During the exam, your health care provider will look into your ear with an instrument called an otoscope. You may have tests, including a hearing test. How is this treated? This condition may be treated by:  Using ear drops to soften the earwax.  Having the earwax removed by a health care provider. The health care provider may: ? Flush the ear with water. ? Use an instrument that  has a loop on the end (curette). ? Use a suction device.  Surgery to remove the wax buildup. This may be done in severe cases. Follow these instructions at home:   Take over-the-counter and prescription medicines only as told by your health care provider.  Do not put any objects, including cotton swabs, into your ear. You can clean the opening of your ear canal with a washcloth or facial tissue.  Follow instructions from your health care provider about cleaning your ears. Do not over-clean your ears.  Drink enough fluid to keep your urine clear or pale yellow. This will help to thin the earwax.  Keep all follow-up visits as told by your health care provider. If earwax builds up in your ears often or if you use hearing aids, consider seeing your health care provider for routine, preventive ear cleanings. Ask your health care provider how often you should schedule your cleanings.  If you have hearing aids, clean them according to instructions from the manufacturer and your health care provider. Contact a health care provider if:  You have ear pain.  You develop a fever.  You have blood, pus, or other fluid coming from your ear.  You have hearing loss.  You have ringing in your ears that does not go away.  Your symptoms do not improve with treatment.  You feel like the room is spinning (vertigo). Summary  Earwax can build up in the ear and cause discomfort or hearing loss.  The most common symptoms of this condition include reduced or muffled hearing and a feeling of fullness in the ear or feeling that the ear is plugged.  This condition may be diagnosed based on your symptoms, your medical history, and an ear exam.  This condition may be treated by using ear drops to soften the earwax or by having the earwax removed by a health care provider.  Do not put any objects, including cotton swabs, into your ear. You can clean the opening of your ear canal with a washcloth or facial  tissue. This information is not intended to replace advice given to you by your health care provider. Make sure you discuss any questions you have with your health care provider. Document Revised: 02/06/2017 Document Reviewed: 05/07/2016 Elsevier Patient Education  2020 [employer].

## 2019-10-28 LAB — CMP14+EGFR
ALT: 7 IU/L (ref 0–32)
AST: 11 IU/L (ref 0–40)
Albumin/Globulin Ratio: 2 (ref 1.2–2.2)
Albumin: 4.3 g/dL (ref 3.7–4.7)
Alkaline Phosphatase: 87 IU/L (ref 48–121)
BUN/Creatinine Ratio: 17 (ref 12–28)
BUN: 13 mg/dL (ref 8–27)
Bilirubin Total: 0.3 mg/dL (ref 0.0–1.2)
CO2: 24 mmol/L (ref 20–29)
Calcium: 9.4 mg/dL (ref 8.7–10.3)
Chloride: 105 mmol/L (ref 96–106)
Creatinine, Ser: 0.77 mg/dL (ref 0.57–1.00)
GFR calc Af Amer: 86 mL/min/{1.73_m2} (ref >59)
GFR calc non Af Amer: 75 mL/min/{1.73_m2} (ref >59)
Globulin, Total: 2.1 g/dL (ref 1.5–4.5)
Glucose: 91 mg/dL (ref 65–99)
Potassium: 4.4 mmol/L (ref 3.5–5.2)
Sodium: 141 mmol/L (ref 134–144)
Total Protein: 6.4 g/dL (ref 6.0–8.5)

## 2019-10-28 LAB — LIPID PANEL
Chol/HDL Ratio: 3 ratio (ref 0.0–4.4)
Cholesterol, Total: 173 mg/dL (ref 100–199)
HDL: 57 mg/dL (ref >39)
LDL Chol Calc (NIH): 96 mg/dL (ref 0–99)
Triglycerides: 113 mg/dL (ref 0–149)
VLDL Cholesterol Cal: 20 mg/dL (ref 5–40)

## 2019-10-28 LAB — CBC
Hematocrit: 44.1 % (ref 34.0–46.6)
Hemoglobin: 14.4 g/dL (ref 11.1–15.9)
MCH: 30.8 pg (ref 26.6–33.0)
MCHC: 32.7 g/dL (ref 31.5–35.7)
MCV: 94 fL (ref 79–97)
Platelets: 211 10*3/uL (ref 150–450)
RBC: 4.67 x10E6/uL (ref 3.77–5.28)
RDW: 12.8 % (ref 11.7–15.4)
WBC: 4.8 10*3/uL (ref 3.4–10.8)

## 2019-10-28 LAB — HEPATITIS C ANTIBODY: Hep C Virus Ab: <0.1 s/co ratio (ref 0.0–0.9)

## 2019-10-28 LAB — VITAMIN D 25 HYDROXY (VIT D DEFICIENCY, FRACTURES): Vit D, 25-Hydroxy: 41.8 ng/mL (ref 30.0–100.0)

## 2019-10-28 LAB — HEMOGLOBIN A1C
Est. average glucose Bld gHb Est-mCnc: 111 mg/dL
Hgb A1c MFr Bld: 5.5 % (ref 4.8–5.6)

## 2019-11-01 ENCOUNTER — Encounter: Payer: Self-pay | Admitting: Nurse Practitioner

## 2019-11-04 ENCOUNTER — Ambulatory Visit
Admission: RE | Admit: 2019-11-04 | Discharge: 2019-11-04 | Disposition: A | Discharge status: Home / Self Care | Payer: Medicare Other | Source: Ambulatory Visit | Attending: Nurse Practitioner | Admitting: Nurse Practitioner

## 2019-11-04 DIAGNOSIS — K8020 Calculus of gallbladder without cholecystitis without obstruction: Secondary | ICD-10-CM | POA: Diagnosis not present

## 2019-11-04 DIAGNOSIS — R1084 Generalized abdominal pain: Secondary | ICD-10-CM

## 2019-11-04 DIAGNOSIS — K828 Other specified diseases of gallbladder: Secondary | ICD-10-CM | POA: Diagnosis not present

## 2019-11-30 ENCOUNTER — Ambulatory Visit (INDEPENDENT_AMBULATORY_CARE_PROVIDER_SITE_OTHER): Payer: Medicare Other | Admitting: Ophthalmology

## 2019-11-30 ENCOUNTER — Encounter (INDEPENDENT_AMBULATORY_CARE_PROVIDER_SITE_OTHER): Payer: Self-pay | Admitting: Ophthalmology

## 2019-11-30 ENCOUNTER — Other Ambulatory Visit: Payer: Self-pay

## 2019-11-30 DIAGNOSIS — H353211 Exudative age-related macular degeneration, right eye, with active choroidal neovascularization: Secondary | ICD-10-CM

## 2019-11-30 MED ORDER — BEVACIZUMAB CHEMO INJECTION 1.25MG/0.05ML SYRINGE FOR KALEIDOSCOPE
1.2500 mg | INTRAVITREAL | Status: AC | PRN
  Administered 2019-11-30: 1.25 mg via INTRAVITREAL

## 2019-11-30 NOTE — Assessment & Plan Note (Signed)
OD, anatomy vastly improved status post intravitreal Avastin No. 1, with much less subretinal fluid.

## 2019-11-30 NOTE — Patient Instructions (Signed)
Instructed to contact the office promptly should new difficulties visual acuity changes or distortion arise

## 2019-11-30 NOTE — Progress Notes (Signed)
11/30/2019     CHIEF COMPLAINT Patient presents for Retina Follow Up   HISTORY OF PRESENT ILLNESS: [name] is a 79 y.o. female who presents to the clinic today for:   HPI    Retina Follow Up    Patient presents with  Wet AMD.  In right eye.  Severity is moderate.  Duration of 5 weeks.  I, the attending physician,  performed the HPI with the patient and updated documentation appropriately.          Comments    5 Week Wet AMD f\u OD. Possible Avastin OD. OCT  Pt states some days vision is better than others. Denies any complaints.       Last edited by Elyse Jarvis on 11/30/2019  9:13 AM. (History)      Referring physician: Arnette Felts, FNP [address] [place],  [place] [address]  HISTORICAL INFORMATION:   Selected notes from the MEDICAL RECORD NUMBER    Lab Results  Component Value Date   HGBA1C 5.5 10/27/2019     CURRENT MEDICATIONS: Current Outpatient Medications (Ophthalmic Drugs)  Medication Sig  . Polyethyl Glycol-Propyl Glycol (SYSTANE OP) Apply to eye. As needed   No current facility-administered medications for this visit. (Ophthalmic Drugs)   Current Outpatient Medications (Other)  Medication Sig  . acetaminophen (TYLENOL) 500 MG tablet Take 1,000 mg by mouth every 6 (six) hours as needed.  . Boswellia-Glucosamine-Vit D (OSTEO BI-FLEX ONE PER DAY PO) Take by mouth.  . Calcium Carbonate-Vit D-Min (CALCIUM 1200 PO) Take 1 capsule by mouth daily.  . cetirizine (ZYRTEC) 10 MG tablet Take 10 mg by mouth daily.  . Cholecalciferol (VITAMIN D) 2000 units tablet Take 2,000 Units by mouth daily.  . clopidogrel (PLAVIX) 75 MG tablet TAKE 1 TABLET BY MOUTH EVERY DAY  . fluticasone (FLONASE) 50 MCG/ACT nasal spray Place into both nostrils daily as needed for allergies or rhinitis.  . Omega-3 Fatty Acids (FISH OIL PO) Take 1 capsule by mouth daily. Takes it sometimes  . pregabalin (LYRICA) 25 MG capsule TAKE 1 CAPSULE BY MOUTH THREE TIMES  A DAY  . sertraline (ZOLOFT) 25 MG tablet TAKE 1 TABLET BY MOUTH EVERY DAY  . topiramate (TOPAMAX) 25 MG tablet Take one tablet in the morning and 3 in the evening  . vitamin C (ASCORBIC ACID) 250 MG tablet Take 250 mg by mouth daily.    No current facility-administered medications for this visit. (Other)      REVIEW OF SYSTEMS:    ALLERGIES Allergies  Allergen Reactions  . Aspirin Anaphylaxis and Hives  . Elavil [Amitriptyline] Anaphylaxis  . Latex   . Relafen [Nabumetone] Hives    PAST MEDICAL HISTORY Past Medical History:  Diagnosis Date  . Abnormality of gait 04/10/2015  . Cervical spondylosis without myelopathy 07/18/2013  . Common migraine with intractable migraine 04/10/2015  . DJD (degenerative joint disease)   . Fibromyalgia   . Hiatal hernia   . Migraine headache    Past Surgical History:  Procedure Laterality Date  . ABDOMINAL HYSTERECTOMY    . DILATION AND CURETTAGE OF UTERUS    . HIATAL HERNIA REPAIR    . TONSILLECTOMY      FAMILY HISTORY Family History  Problem Relation Age of Onset  . CVA Mother   . Stroke Father   . Emphysema Father     SOCIAL HISTORY Social History   Tobacco Use  . Smoking status: Never Smoker  . Smokeless tobacco:  Never Used  Vaping Use  . Vaping Use: Never used  Substance Use Topics  . Alcohol use: No    Comment: very occasional  . Drug use: No         OPHTHALMIC EXAM:  Base Eye Exam    Visual Acuity (Snellen - Linear)      Right Left   Dist cc 20/100 -2 20/25 +   Dist ph cc 20/50        Tonometry (Tonopen, 9:20 AM)      Right Left   Pressure 11 11       Pupils      Pupils Dark Light Shape React APD   Right PERRL 5 4 Round Brisk None   Left PERRL 5 4 Round Brisk None       Visual Fields (Counting fingers)      Left Right    Full Full       Neuro/Psych    Oriented x3: Yes   Mood/Affect: Normal       Dilation    Right eye: 1.0% Mydriacyl, 2.5% Phenylephrine @ 9:20 AM        Slit Lamp  and Fundus Exam    External Exam      Right Left   External Normal Normal       Slit Lamp Exam      Right Left   Lids/Lashes Normal Normal   Conjunctiva/Sclera White and quiet White and quiet   Cornea Clear Clear   Anterior Chamber Deep and quiet Deep and quiet   Iris Round and reactive Round and reactive   Lens 2+ Nuclear sclerosis 2+ Nuclear sclerosis   Anterior Vitreous Normal Normal       Fundus Exam      Right Left   Posterior Vitreous Central vitreous floaters    Disc Normal    C/D Ratio 0.2    Macula Macular thickening, Smaller Subretinal neovascular membrane, Retinal pigment epithelial mottling, Early age related macular degeneration    Vessels Normal    Periphery Normal           IMAGING AND PROCEDURES  Imaging and Procedures for 11/30/19  OCT, Retina - OU - Both Eyes       Right Eye Quality was good. Scan locations included subfoveal. Central Foveal Thickness: 274. Findings include no SRF, intraretinal fluid.   Left Eye Quality was good. Scan locations included subfoveal. Central Foveal Thickness: 261. Findings include retinal drusen , abnormal foveal contour, no IRF, no SRF.   Notes Subretinal scarring and pigmentary change right eye, but no subretinal fluid as compared to 5 weeks previous when active CNVM was present prior to therapy       Intravitreal Injection, Pharmacologic Agent - OD - Right Eye       Time Out 11/30/2019. 9:56 AM. Confirmed correct patient, procedure, site, and patient consented.   Anesthesia Anesthetic medications included Akten 3.5%.   Procedure Preparation included Ofloxacin , Tobramycin 0.3%, 10% betadine to eyelids, 5% betadine to ocular surface. A supplied needle was used.   Injection:  1.25 mg Bevacizumab (AVASTIN) SOLN   NDC: [address]-5, Lot: [id number]   Route: Intravitreal, Site: Right Eye, Waste: 0 mg  Post-op Post injection exam found visual acuity of at least counting fingers. The patient tolerated the  procedure well. There were no complications. The patient received written and verbal post procedure care education. Post injection medications were not given.  ASSESSMENT/PLAN:  Exudative age-related macular degeneration of right eye with active choroidal neovascularization (HCC) OD, anatomy vastly improved status post intravitreal Avastin No. 1, with much less subretinal fluid.      ICD-10-CM   1. Exudative age-related macular degeneration of right eye with active choroidal neovascularization (HCC)  H35.3211 OCT, Retina - OU - Both Eyes    Intravitreal Injection, Pharmacologic Agent - OD - Right Eye    Bevacizumab (AVASTIN) SOLN 1.25 mg    1.  Repeat Intra-Op vitreal Avastin OD today, number 2 injection.  Much improved anatomy of the macula post injection #1  2.  For patient to proceed with cataract extraction in either eye as scheduled, with Dr. Ernesto Rutherford  3.  Ophthalmic Meds Ordered this visit:  Meds ordered this encounter  Medications  . Bevacizumab (AVASTIN) SOLN 1.25 mg       Return in about 6 weeks (around 01/11/2020) for dilate, OD, AVASTIN OCT.  Patient Instructions  Instructed to contact the office promptly should new difficulties visual acuity changes or distortion arise    Explained the diagnoses, plan, and follow up with the patient and they expressed understanding.  Patient expressed understanding of the importance of proper follow up care.   Alford Highland Shawntae Lowy M.D. Diseases & Surgery of the Retina and Vitreous [facility] 11/30/19     Abbreviations: M myopia (nearsighted); A astigmatism; H hyperopia (farsighted); P presbyopia; Mrx spectacle prescription;  CTL contact lenses; OD right eye; OS left eye; OU both eyes  XT exotropia; ET esotropia; PEK punctate epithelial keratitis; PEE punctate epithelial erosions; DES dry eye syndrome; MGD meibomian gland dysfunction; ATs artificial tears; PFAT's preservative free  artificial tears; NSC nuclear sclerotic cataract; PSC posterior subcapsular cataract; ERM epi-retinal membrane; PVD posterior vitreous detachment; RD retinal detachment; DM diabetes mellitus; DR diabetic retinopathy; NPDR non-proliferative diabetic retinopathy; PDR proliferative diabetic retinopathy; CSME clinically significant macular edema; DME diabetic macular edema; dbh dot blot hemorrhages; CWS cotton wool spot; POAG primary open angle glaucoma; C/D cup-to-disc ratio; HVF humphrey visual field; GVF goldmann visual field; OCT optical coherence tomography; IOP intraocular pressure; BRVO Branch retinal vein occlusion; CRVO central retinal vein occlusion; CRAO central retinal artery occlusion; BRAO branch retinal artery occlusion; RT retinal tear; SB scleral buckle; PPV pars plana vitrectomy; VH Vitreous hemorrhage; PRP panretinal laser photocoagulation; IVK intravitreal kenalog; VMT vitreomacular traction; MH Macular hole;  NVD neovascularization of the disc; NVE neovascularization elsewhere; AREDS age related eye disease study; ARMD age related macular degeneration; POAG primary open angle glaucoma; EBMD epithelial/anterior basement membrane dystrophy; ACIOL anterior chamber intraocular lens; IOL intraocular lens; PCIOL posterior chamber intraocular lens; Phaco/IOL phacoemulsification with intraocular lens placement; PRK photorefractive keratectomy; LASIK laser assisted in situ keratomileusis; HTN hypertension; DM diabetes mellitus; COPD chronic obstructive pulmonary disease

## 2019-12-02 DIAGNOSIS — H25811 Combined forms of age-related cataract, right eye: Secondary | ICD-10-CM | POA: Diagnosis not present

## 2019-12-21 ENCOUNTER — Telehealth: Payer: Self-pay | Admitting: Nurse Practitioner

## 2019-12-21 NOTE — Telephone Encounter (Signed)
I left a message asking the patient to call and schedule her yearly Medicare questionnaire with [place].   

## 2020-01-04 ENCOUNTER — Telehealth: Payer: Self-pay

## 2020-01-04 NOTE — Telephone Encounter (Signed)
I called the pt to schedule AWV w/ [name]. No answer

## 2020-01-11 ENCOUNTER — Encounter (INDEPENDENT_AMBULATORY_CARE_PROVIDER_SITE_OTHER): Payer: Medicare Other | Admitting: Ophthalmology

## 2020-01-11 ENCOUNTER — Ambulatory Visit (INDEPENDENT_AMBULATORY_CARE_PROVIDER_SITE_OTHER): Payer: Medicare Other | Admitting: Nurse Practitioner

## 2020-01-11 ENCOUNTER — Encounter: Payer: Self-pay | Admitting: Nurse Practitioner

## 2020-01-11 ENCOUNTER — Other Ambulatory Visit: Payer: Self-pay

## 2020-01-11 VITALS — BP 130/72 | HR 71 | Temp 98.7°F

## 2020-01-11 DIAGNOSIS — R0602 Shortness of breath: Secondary | ICD-10-CM | POA: Diagnosis not present

## 2020-01-11 DIAGNOSIS — R059 Cough, unspecified: Secondary | ICD-10-CM | POA: Diagnosis not present

## 2020-01-11 DIAGNOSIS — K8020 Calculus of gallbladder without cholecystitis without obstruction: Secondary | ICD-10-CM | POA: Diagnosis not present

## 2020-01-11 DIAGNOSIS — J069 Acute upper respiratory infection, unspecified: Secondary | ICD-10-CM

## 2020-01-11 MED ORDER — AMOXICILLIN 875 MG PO TABS: 875.0000 mg | ORAL_TABLET | Freq: Two times a day (BID) | ORAL | 0 refills | Status: DC

## 2020-01-11 NOTE — Patient Instructions (Addendum)

## 2020-01-11 NOTE — Progress Notes (Signed)
I,Tianna Badgett,acting as a Neurosurgeon for [employer], FNP.,have documented all relevant documentation on the behalf of Arnette Felts, FNP,as directed by  Arnette Felts, FNP while in the presence of Arnette Felts, FNP.  This visit occurred during the SARS-CoV-2 public health emergency.  Safety protocols were in place, including screening questions prior to the visit, additional usage of staff PPE, and extensive cleaning of exam room while observing appropriate contact time as indicated for disinfecting solutions.  Subjective:     Patient ID: [name] , female    DOB: 1941-01-05 , 79 y.o.   MRN: [id number]   Chief Complaint  Patient presents with  . Cough    HPI  Patient is here today to be evaluated for cough. Her symptoms started about a week ago. She reports that her daughter was sick as well but worse off than she is. She is also complaining of congestion, runny nose, fatigue and headaches. She says that she has tried OTC medications with no relief. She was scheduled to have an injection to her eye this morning and was sent here to be evaluated.    Cough This is a new problem. The current episode started in the past 7 days. The problem has been gradually improving. The problem occurs every few hours. The cough is productive of sputum (she was coughing up yellow and white mucous). Associated symptoms include headaches, nasal congestion and rhinorrhea. Nothing aggravates the symptoms. She has tried OTC cough suppressant (mucinex and zyrtec) for the symptoms. The treatment provided no relief. There is no history of asthma.     Past Medical History:  Diagnosis Date  . Abnormality of gait 04/10/2015  . Cervical spondylosis without myelopathy 07/18/2013  . Common migraine with intractable migraine 04/10/2015  . DJD (degenerative joint disease)   . Fibromyalgia   . Hiatal hernia   . Migraine headache      Family History  Problem Relation Age of Onset  . CVA Mother   . Stroke Father   .  Emphysema Father      Current Outpatient Medications:  .  acetaminophen (TYLENOL) 500 MG tablet, Take 1,000 mg by mouth every 6 (six) hours as needed., Disp: , Rfl:  .  amoxicillin (AMOXIL) 875 MG tablet, Take 1 tablet (875 mg total) by mouth 2 (two) times daily., Disp: 14 tablet, Rfl: 0 .  Boswellia-Glucosamine-Vit D (OSTEO BI-FLEX ONE PER DAY PO), Take by mouth., Disp: , Rfl:  .  Calcium Carbonate-Vit D-Min (CALCIUM 1200 PO), Take 1 capsule by mouth daily., Disp: , Rfl:  .  cetirizine (ZYRTEC) 10 MG tablet, Take 10 mg by mouth daily., Disp: , Rfl:  .  Cholecalciferol (VITAMIN D) 2000 units tablet, Take 2,000 Units by mouth daily., Disp: , Rfl:  .  clopidogrel (PLAVIX) 75 MG tablet, TAKE 1 TABLET BY MOUTH EVERY DAY, Disp: 90 tablet, Rfl: 1 .  fluticasone (FLONASE) 50 MCG/ACT nasal spray, Place into both nostrils daily as needed for allergies or rhinitis., Disp: , Rfl:  .  Omega-3 Fatty Acids (FISH OIL PO), Take 1 capsule by mouth daily. Takes it sometimes, Disp: , Rfl:  .  Polyethyl Glycol-Propyl Glycol (SYSTANE OP), Apply to eye. As needed, Disp: , Rfl:  .  pregabalin (LYRICA) 25 MG capsule, TAKE 1 CAPSULE BY MOUTH THREE TIMES A DAY, Disp: 270 capsule, Rfl: 1 .  sertraline (ZOLOFT) 25 MG tablet, TAKE 1 TABLET BY MOUTH EVERY DAY, Disp: 90 tablet, Rfl: 1 .  topiramate (TOPAMAX) 25 MG tablet,  Take one tablet in the morning and 3 in the evening, Disp: 360 tablet, Rfl: 3 .  vitamin C (ASCORBIC ACID) 250 MG tablet, Take 250 mg by mouth daily. , Disp: , Rfl:    Allergies  Allergen Reactions  . Aspirin Anaphylaxis and Hives  . Elavil [Amitriptyline] Anaphylaxis  . Latex   . Relafen [Nabumetone] Hives     Review of Systems  Constitutional: Positive for fatigue.  HENT: Positive for congestion, rhinorrhea, sinus pressure and sneezing.   Respiratory: Positive for cough.   Neurological: Positive for headaches.     Today's Vitals   01/11/20 1018  BP: 130/72  Pulse: 71  Temp: 98.7 F (37.1  C)  TempSrc: Oral   There is no height or weight on file to calculate BMI.   Objective:  Physical Exam Vitals reviewed.  Constitutional:      General: She is not in acute distress.    Appearance: Normal appearance.  HENT:     Head: Normocephalic.     Nose: Nose normal.     Mouth/Throat:     Mouth: Mucous membranes are moist.  Eyes:     Extraocular Movements: Extraocular movements intact.     Conjunctiva/sclera: Conjunctivae normal.     Pupils: Pupils are equal, round, and reactive to light.  Cardiovascular:     Rate and Rhythm: Normal rate and regular rhythm.     Pulses: Normal pulses.     Heart sounds: Normal heart sounds. No murmur heard.   Pulmonary:     Effort: Pulmonary effort is normal. No respiratory distress.     Breath sounds: Normal breath sounds. No wheezing.  Skin:    General: Skin is warm and dry.  Neurological:     General: No focal deficit present.     Mental Status: She is alert and oriented to person, place, and time.     Cranial Nerves: No cranial nerve deficit.  Psychiatric:        Mood and Affect: Mood normal.        Behavior: Behavior normal.        Thought Content: Thought content normal.        Judgment: Judgment normal.         Assessment And Plan:     1. Cough  Will treat with amoxicillin for possible URI and will obtain CXR due to history of pneumonia. Breath sounds are clear bilaterally  She is also encouraged to remain isolated until her covid test returns - Novel Coronavirus, NAA ([facility]) - amoxicillin (AMOXIL) 875 MG tablet; Take 1 tablet (875 mg total) by mouth 2 (two) times daily.  Dispense: 14 tablet; Refill: 0 - DG Chest 2 View; Future  2. Calculus of gallbladder without cholecystitis without obstruction Seen on CT scan of abdomen, we attempted to contact her by phone on 3 occasions with no answer She would like to wait to be referred to a surgeon, discussed risk of enlarging and potential to block the bile duct  3. Viral  upper respiratory tract infection  Stay well hydrated with water and get rest - amoxicillin (AMOXIL) 875 MG tablet; Take 1 tablet (875 mg total) by mouth 2 (two) times daily.  Dispense: 14 tablet; Refill: 0  4. Shortness of breath  Will check CXR, no shortness of breath noted during visit - DG Chest 2 View; Future     Patient was given opportunity to ask questions. Patient verbalized understanding of the plan and was able to repeat key elements  of the plan. All questions were answered to their satisfaction.    Jeanell Sparrow, FNP, have reviewed all documentation for this visit. The documentation on 01/11/20 for the exam, diagnosis, procedures, and orders are all accurate and complete.  THE PATIENT IS ENCOURAGED TO PRACTICE SOCIAL DISTANCING DUE TO THE COVID-19 PANDEMIC.

## 2020-01-12 ENCOUNTER — Ambulatory Visit
Admission: RE | Admit: 2020-01-12 | Discharge: 2020-01-12 | Disposition: A | Discharge status: Home / Self Care | Payer: Medicare Other | Source: Ambulatory Visit | Attending: Nurse Practitioner | Admitting: Nurse Practitioner

## 2020-01-12 DIAGNOSIS — R0602 Shortness of breath: Secondary | ICD-10-CM | POA: Diagnosis not present

## 2020-01-12 DIAGNOSIS — R059 Cough, unspecified: Secondary | ICD-10-CM | POA: Diagnosis not present

## 2020-01-13 LAB — SARS-COV-2, NAA 2 DAY TAT

## 2020-01-13 LAB — NOVEL CORONAVIRUS, NAA: SARS-CoV-2, NAA: NOT DETECTED

## 2020-01-19 ENCOUNTER — Ambulatory Visit (INDEPENDENT_AMBULATORY_CARE_PROVIDER_SITE_OTHER): Payer: Medicare Other | Admitting: Ophthalmology

## 2020-01-19 ENCOUNTER — Encounter (INDEPENDENT_AMBULATORY_CARE_PROVIDER_SITE_OTHER): Payer: Self-pay | Admitting: Ophthalmology

## 2020-01-19 ENCOUNTER — Other Ambulatory Visit: Payer: Self-pay

## 2020-01-19 DIAGNOSIS — H353132 Nonexudative age-related macular degeneration, bilateral, intermediate dry stage: Secondary | ICD-10-CM

## 2020-01-19 DIAGNOSIS — H353211 Exudative age-related macular degeneration, right eye, with active choroidal neovascularization: Secondary | ICD-10-CM | POA: Diagnosis not present

## 2020-01-19 DIAGNOSIS — H43812 Vitreous degeneration, left eye: Secondary | ICD-10-CM

## 2020-01-19 MED ORDER — BEVACIZUMAB CHEMO INJECTION 1.25MG/0.05ML SYRINGE FOR KALEIDOSCOPE
1.2500 mg | INTRAVITREAL | Status: AC | PRN
  Administered 2020-01-19: 1.25 mg via INTRAVITREAL

## 2020-01-19 NOTE — Assessment & Plan Note (Signed)
The nature of wet macular degeneration was discussed with the patient.  Forms of therapy reviewed include the use of Anti-VEGF medications injected painlessly into the eye, as well as other possible treatment modalities, including thermal laser therapy. Fellow eye involvement and risks were discussed with the patient. Upon the finding of wet age related macular degeneration, treatment will be offered. The treatment regimen is on a treat as needed basis with the intent to treat if necessary and extend interval of exams when possible. On average 1 out of 6 patients do not need lifetime therapy. However, the risk of recurrent disease is high for a lifetime.  Initially monthly, then periodic, examinations and evaluations will determine whether the next treatment is required on the day of the examination.  Improved since onset August 2021 on Avastin, currently 7-week interval, repeat injection today

## 2020-01-19 NOTE — Patient Instructions (Signed)
Patient to report new onset visual acuity decline or distortion at any time

## 2020-01-19 NOTE — Assessment & Plan Note (Signed)
No active CNVM left eye by OCT

## 2020-01-19 NOTE — Assessment & Plan Note (Signed)

## 2020-01-19 NOTE — Progress Notes (Signed)
01/19/2020     CHIEF COMPLAINT Patient presents for Retina Follow Up   HISTORY OF PRESENT ILLNESS: [name] is a 79 y.o. female who presents to the clinic today for:   HPI    Retina Follow Up    Patient presents with  Wet AMD.  In right eye.  This started 7 weeks ago.  Severity is mild.  Duration of 7 weeks.  Since onset it is stable.          Comments    7 Week AMD F/U OD, poss Avastin OD  Pt denies noticeable changes to [place] OU since last visit. Pt denies ocular pain, flashes of light, or floaters OU.         Last edited by Ileana Roup, COA on 01/19/2020  3:16 PM. (History)      Referring physician: Arnette Felts, FNP [address] [place],  [place] [address]  HISTORICAL INFORMATION:   Selected notes from the MEDICAL RECORD NUMBER    Lab Results  Component Value Date   HGBA1C 5.5 10/27/2019     CURRENT MEDICATIONS: Current Outpatient Medications (Ophthalmic Drugs)  Medication Sig  . Polyethyl Glycol-Propyl Glycol (SYSTANE OP) Apply to eye. As needed   No current facility-administered medications for this visit. (Ophthalmic Drugs)   Current Outpatient Medications (Other)  Medication Sig  . acetaminophen (TYLENOL) 500 MG tablet Take 1,000 mg by mouth every 6 (six) hours as needed.  Marland Kitchen amoxicillin (AMOXIL) 875 MG tablet Take 1 tablet (875 mg total) by mouth 2 (two) times daily.  . Boswellia-Glucosamine-Vit D (OSTEO BI-FLEX ONE PER DAY PO) Take by mouth.  . Calcium Carbonate-Vit D-Min (CALCIUM 1200 PO) Take 1 capsule by mouth daily.  . cetirizine (ZYRTEC) 10 MG tablet Take 10 mg by mouth daily.  . Cholecalciferol (VITAMIN D) 2000 units tablet Take 2,000 Units by mouth daily.  . clopidogrel (PLAVIX) 75 MG tablet TAKE 1 TABLET BY MOUTH EVERY DAY  . fluticasone (FLONASE) 50 MCG/ACT nasal spray Place into both nostrils daily as needed for allergies or rhinitis.  . Omega-3 Fatty Acids (FISH OIL PO) Take 1 capsule by mouth daily. Takes it  sometimes  . pregabalin (LYRICA) 25 MG capsule TAKE 1 CAPSULE BY MOUTH THREE TIMES A DAY  . sertraline (ZOLOFT) 25 MG tablet TAKE 1 TABLET BY MOUTH EVERY DAY  . topiramate (TOPAMAX) 25 MG tablet Take one tablet in the morning and 3 in the evening  . vitamin C (ASCORBIC ACID) 250 MG tablet Take 250 mg by mouth daily.    No current facility-administered medications for this visit. (Other)      REVIEW OF SYSTEMS:    ALLERGIES Allergies  Allergen Reactions  . Aspirin Anaphylaxis and Hives  . Elavil [Amitriptyline] Anaphylaxis  . Latex   . Relafen [Nabumetone] Hives    PAST MEDICAL HISTORY Past Medical History:  Diagnosis Date  . Abnormality of gait 04/10/2015  . Cervical spondylosis without myelopathy 07/18/2013  . Common migraine with intractable migraine 04/10/2015  . DJD (degenerative joint disease)   . Fibromyalgia   . Hiatal hernia   . Migraine headache    Past Surgical History:  Procedure Laterality Date  . ABDOMINAL HYSTERECTOMY    . DILATION AND CURETTAGE OF UTERUS    . HIATAL HERNIA REPAIR    . TONSILLECTOMY      FAMILY HISTORY Family History  Problem Relation Age of Onset  . CVA Mother   . Stroke Father   . Emphysema  Father     SOCIAL HISTORY Social History   Tobacco Use  . Smoking status: Never Smoker  . Smokeless tobacco: Never Used  Vaping Use  . Vaping Use: Never used  Substance Use Topics  . Alcohol use: No    Comment: very occasional  . Drug use: No         OPHTHALMIC EXAM:  Base Eye Exam    Visual Acuity (ETDRS)      Right Left   Dist [place] 20/50 +2 20/30   Dist ph [place] 20/25 -2       Tonometry (Tonopen, 3:16 PM)      Right Left   Pressure 09 08       Pupils      Pupils Dark Light Shape React APD   Right PERRL 5 4 Round Brisk None   Left PERRL 5 4 Round Brisk None       Visual Fields (Counting fingers)      Left Right    Full Full       Extraocular Movement      Right Left    Full Full       Neuro/Psych     Oriented x3: Yes   Mood/Affect: Normal       Dilation    Right eye: 1.0% Mydriacyl, 2.5% Phenylephrine @ 3:22 PM        Slit Lamp and Fundus Exam    External Exam      Right Left   External Normal Normal       Slit Lamp Exam      Right Left   Lids/Lashes Normal Normal   Conjunctiva/Sclera White and quiet White and quiet   Cornea Clear Clear   Anterior Chamber Deep and quiet Deep and quiet   Iris Round and reactive Round and reactive   Lens 2+ Nuclear sclerosis 2+ Nuclear sclerosis   Anterior Vitreous Normal Normal       Fundus Exam      Right Left   Posterior Vitreous Central vitreous floaters    Disc Normal    C/D Ratio 0.2    Macula Macular thickening, Smaller Subretinal neovascular membrane, Retinal pigment epithelial mottling, Early age related macular degeneration    Vessels Normal    Periphery Normal           IMAGING AND PROCEDURES  Imaging and Procedures for 01/19/20  OCT, Retina - OU - Both Eyes       Right Eye Quality was good. Scan locations included subfoveal. Central Foveal Thickness: 310. Progression has improved. Findings include vitreomacular adhesion , subretinal fluid.   Left Eye Quality was good. Scan locations included subfoveal. Central Foveal Thickness: 269. Progression has been stable.   Notes Much less subretinal fluid right eye, status post Avastin currently at 7-week follow-up. Incidental posterior vitreous detachment with small amount of focal inner foveal detachment is noted right eye.  OS With posterior vitreous detachment as well.       Intravitreal Injection, Pharmacologic Agent - OD - Right Eye       Time Out 01/19/2020. 3:46 PM. Confirmed correct patient, procedure, site, and patient consented.   Anesthesia Anesthetic medications included Akten 3.5%.   Procedure Preparation included Ofloxacin , Tobramycin 0.3%, 10% betadine to eyelids, 5% betadine to ocular surface. A supplied needle was used.   Injection:  1.25  mg Bevacizumab (AVASTIN) SOLN   NDC: 70360-001-02, Lot: [id number]   Route: Intravitreal, Site: Right Eye, Waste: 0 mg  Post-op Post injection exam found visual acuity of at least counting fingers. The patient tolerated the procedure well. There were no complications. The patient received written and verbal post procedure care education. Post injection medications were not given.                 ASSESSMENT/PLAN:  Exudative age-related macular degeneration of right eye with active choroidal neovascularization (HCC) The nature of wet macular degeneration was discussed with the patient.  Forms of therapy reviewed include the use of Anti-VEGF medications injected painlessly into the eye, as well as other possible treatment modalities, including thermal laser therapy. Fellow eye involvement and risks were discussed with the patient. Upon the finding of wet age related macular degeneration, treatment will be offered. The treatment regimen is on a treat as needed basis with the intent to treat if necessary and extend interval of exams when possible. On average 1 out of 6 patients do not need lifetime therapy. However, the risk of recurrent disease is high for a lifetime.  Initially monthly, then periodic, examinations and evaluations will determine whether the next treatment is required on the day of the examination.  Improved since onset August 2021 on Avastin, currently 7-week interval, repeat injection today  Intermediate stage nonexudative age-related macular degeneration of both eyes No active CNVM left eye by OCT  Posterior vitreous detachment of left eye   The nature of posterior vitreous detachment was discussed with the patient as well as its physiology, its age prevalence, and its possible implication regarding retinal breaks and detachment.  An informational brochure was given to the patient.  All the patient's questions were answered.  The patient was asked to return if new or different  flashes or floaters develops.   Patient was instructed to contact office immediately if any changes were noticed. I explained to the patient that vitreous inside the eye is similar to jello inside a bowl. As the jello melts it can start to pull away from the bowl, similarly the vitreous throughout our lives can begin to pull away from the retina. That process is called a posterior vitreous detachment. In some cases, the vitreous can tug hard enough on the retina to form a retinal tear. I discussed with the patient the signs and symptoms of a retinal detachment.  Do not rub the eye.      ICD-10-CM   1. Exudative age-related macular degeneration of right eye with active choroidal neovascularization (HCC)  H35.3211 OCT, Retina - OU - Both Eyes    Intravitreal Injection, Pharmacologic Agent - OD - Right Eye    Bevacizumab (AVASTIN) SOLN 1.25 mg  2. Intermediate stage nonexudative age-related macular degeneration of both eyes  H35.3132   3. Posterior vitreous detachment of left eye  H43.812     1.  Repeat intravitreal Avastin OD today at 7-week interval and follow-up in 7 to 8 weeks  2.  3.  Ophthalmic Meds Ordered this visit:  Meds ordered this encounter  Medications  . Bevacizumab (AVASTIN) SOLN 1.25 mg       Return in about 7 weeks (around 03/08/2020) for dilate, OD, AVASTIN OCT.  Patient Instructions  Patient to report new onset visual acuity decline or distortion at any time    Explained the diagnoses, plan, and follow up with the patient and they expressed understanding.  Patient expressed understanding of the importance of proper follow up care.   Alford HighlandGary A. Shatonia Hoots M.D. Diseases & Surgery of the Retina and Vitreous [facility] 01/19/20     Abbreviations: M myopia (nearsighted); A astigmatism; H hyperopia (farsighted); P presbyopia; Mrx spectacle prescription;  CTL contact lenses; OD right eye; OS left eye; OU both eyes  XT exotropia; ET esotropia; PEK  punctate epithelial keratitis; PEE punctate epithelial erosions; DES dry eye syndrome; MGD meibomian gland dysfunction; ATs artificial tears; PFAT's preservative free artificial tears; NSC nuclear sclerotic cataract; PSC posterior subcapsular cataract; ERM epi-retinal membrane; PVD posterior vitreous detachment; RD retinal detachment; DM diabetes mellitus; DR diabetic retinopathy; NPDR non-proliferative diabetic retinopathy; PDR proliferative diabetic retinopathy; CSME clinically significant macular edema; DME diabetic macular edema; dbh dot blot hemorrhages; CWS cotton wool spot; POAG primary open angle glaucoma; C/D cup-to-disc ratio; HVF humphrey visual field; GVF goldmann visual field; OCT optical coherence tomography; IOP intraocular pressure; BRVO Branch retinal vein occlusion; CRVO central retinal vein occlusion; CRAO central retinal artery occlusion; BRAO branch retinal artery occlusion; RT retinal tear; SB scleral buckle; PPV pars plana vitrectomy; VH Vitreous hemorrhage; PRP panretinal laser photocoagulation; IVK intravitreal kenalog; VMT vitreomacular traction; MH Macular hole;  NVD neovascularization of the disc; NVE neovascularization elsewhere; AREDS age related eye disease study; ARMD age related macular degeneration; POAG primary open angle glaucoma; EBMD epithelial/anterior basement membrane dystrophy; ACIOL anterior chamber intraocular lens; IOL intraocular lens; PCIOL posterior chamber intraocular lens; Phaco/IOL phacoemulsification with intraocular lens placement; PRK photorefractive keratectomy; LASIK laser assisted in situ keratomileusis; HTN hypertension; DM diabetes mellitus; COPD chronic obstructive pulmonary disease

## 2020-02-16 ENCOUNTER — Other Ambulatory Visit: Payer: Self-pay | Admitting: Nurse Practitioner

## 2020-02-16 DIAGNOSIS — Z8673 Personal history of transient ischemic attack (TIA), and cerebral infarction without residual deficits: Secondary | ICD-10-CM

## 2020-03-08 ENCOUNTER — Encounter (INDEPENDENT_AMBULATORY_CARE_PROVIDER_SITE_OTHER): Payer: Self-pay | Admitting: Ophthalmology

## 2020-03-08 ENCOUNTER — Ambulatory Visit (INDEPENDENT_AMBULATORY_CARE_PROVIDER_SITE_OTHER): Payer: Medicare Other | Admitting: Ophthalmology

## 2020-03-08 ENCOUNTER — Other Ambulatory Visit: Payer: Self-pay

## 2020-03-08 DIAGNOSIS — H353211 Exudative age-related macular degeneration, right eye, with active choroidal neovascularization: Secondary | ICD-10-CM

## 2020-03-08 DIAGNOSIS — H43821 Vitreomacular adhesion, right eye: Secondary | ICD-10-CM | POA: Insufficient documentation

## 2020-03-08 HISTORY — DX: Vitreomacular adhesion, right eye: H43.821

## 2020-03-08 MED ORDER — BEVACIZUMAB 2.5 MG/0.1ML IZ SOSY
2.5000 mg | PREFILLED_SYRINGE | INTRAVITREAL | Status: AC | PRN
  Administered 2020-03-08: 2.5 mg via INTRAVITREAL

## 2020-03-08 NOTE — Assessment & Plan Note (Signed)
Improved OD at current 7-week follow-up interval.  Yet still slightly active serous retinal detachment temporal OD.  Repeat injection intravitreal Avastin today and examination in 7 weeks

## 2020-03-08 NOTE — Assessment & Plan Note (Signed)
Currently minor no effect on acuity OD

## 2020-03-08 NOTE — Progress Notes (Signed)
03/08/2020     CHIEF COMPLAINT Patient presents for Retina Follow Up (7 Week AMD F/U OD, poss Avastin OD//Pt sts [facility] OD is improving. Pt sts small print like in the newspaper is difficult to read.)   HISTORY OF PRESENT ILLNESS: [name] is a 79 y.o. female who presents to the clinic today for:   HPI    Retina Follow Up    Patient presents with  Wet AMD.  In right eye.  This started 7 weeks ago.  Severity is mild.  Duration of 7 weeks.  Since onset it is gradually improving. Additional comments: 7 Week AMD F/U OD, poss Avastin OD  Pt sts [facility] OD is improving. Pt sts small print like in the newspaper is difficult to read.       Last edited by Ileana Roup, COA on 03/08/2020  1:20 PM. (History)      Referring physician: Arnette Felts, FNP [address] [place],  [place] [address]  HISTORICAL INFORMATION:   Selected notes from the MEDICAL RECORD NUMBER    Lab Results  Component Value Date   HGBA1C 5.5 10/27/2019      CURRENT MEDICATIONS: Current Outpatient Medications (Ophthalmic Drugs)  Medication Sig  . Polyethyl Glycol-Propyl Glycol (SYSTANE OP) Apply to eye. As needed   No current facility-administered medications for this visit. (Ophthalmic Drugs)   Current Outpatient Medications (Other)  Medication Sig  . acetaminophen (TYLENOL) 500 MG tablet Take 1,000 mg by mouth every 6 (six) hours as needed.  Marland Kitchen amoxicillin (AMOXIL) 875 MG tablet Take 1 tablet (875 mg total) by mouth 2 (two) times daily.  . Boswellia-Glucosamine-Vit D (OSTEO BI-FLEX ONE PER DAY PO) Take by mouth.  . Calcium Carbonate-Vit D-Min (CALCIUM 1200 PO) Take 1 capsule by mouth daily.  . cetirizine (ZYRTEC) 10 MG tablet Take 10 mg by mouth daily.  . Cholecalciferol (VITAMIN D) 2000 units tablet Take 2,000 Units by mouth daily.  . clopidogrel (PLAVIX) 75 MG tablet TAKE 1 TABLET BY MOUTH EVERY DAY  . fluticasone (FLONASE) 50 MCG/ACT nasal spray Place into both nostrils daily as needed  for allergies or rhinitis.  . Omega-3 Fatty Acids (FISH OIL PO) Take 1 capsule by mouth daily. Takes it sometimes  . pregabalin (LYRICA) 25 MG capsule TAKE 1 CAPSULE BY MOUTH THREE TIMES A DAY  . sertraline (ZOLOFT) 25 MG tablet TAKE 1 TABLET BY MOUTH EVERY DAY  . topiramate (TOPAMAX) 25 MG tablet Take one tablet in the morning and 3 in the evening  . vitamin C (ASCORBIC ACID) 250 MG tablet Take 250 mg by mouth daily.    No current facility-administered medications for this visit. (Other)      REVIEW OF SYSTEMS:    ALLERGIES Allergies  Allergen Reactions  . Aspirin Anaphylaxis and Hives  . Elavil [Amitriptyline] Anaphylaxis  . Latex   . Relafen [Nabumetone] Hives    PAST MEDICAL HISTORY Past Medical History:  Diagnosis Date  . Abnormality of gait 04/10/2015  . Cervical spondylosis without myelopathy 07/18/2013  . Common migraine with intractable migraine 04/10/2015  . DJD (degenerative joint disease)   . Fibromyalgia   . Hiatal hernia   . Migraine headache    Past Surgical History:  Procedure Laterality Date  . ABDOMINAL HYSTERECTOMY    . DILATION AND CURETTAGE OF UTERUS    . HIATAL HERNIA REPAIR    . TONSILLECTOMY      FAMILY HISTORY Family History  Problem Relation Age of Onset  .  CVA Mother   . Stroke Father   . Emphysema Father     SOCIAL HISTORY Social History   Tobacco Use  . Smoking status: Never Smoker  . Smokeless tobacco: Never Used  Vaping Use  . Vaping Use: Never used  Substance Use Topics  . Alcohol use: No    Comment: very occasional  . Drug use: No         OPHTHALMIC EXAM:  Base Eye Exam    Visual Acuity (ETDRS)      Right Left   Dist [place] 20/50 +1 20/30 -2   Dist ph [place] 20/25 -1 NI       Tonometry (Tonopen, 1:20 PM)      Right Left   Pressure 12 14       Pupils      Pupils Dark Light Shape React APD   Right PERRL 5 4 Round Brisk None   Left PERRL 5 4 Round Brisk None       Visual Fields (Counting fingers)      Left  Right    Full Full       Extraocular Movement      Right Left    Full Full       Neuro/Psych    Oriented x3: Yes   Mood/Affect: Normal       Dilation    Right eye: 1.0% Mydriacyl, 2.5% Phenylephrine @ 1:26 PM        Slit Lamp and Fundus Exam    External Exam      Right Left   External Normal Normal       Slit Lamp Exam      Right Left   Lids/Lashes Normal Normal   Conjunctiva/Sclera White and quiet White and quiet   Cornea Clear Clear   Anterior Chamber Deep and quiet Deep and quiet   Iris Round and reactive Round and reactive   Lens 2+ Nuclear sclerosis 2+ Nuclear sclerosis   Anterior Vitreous Normal Normal       Fundus Exam      Right Left   Posterior Vitreous Central vitreous floaters    Disc Normal    C/D Ratio 0.2    Macula Macular thickening, Smaller Subretinal neovascular membrane, Retinal pigment epithelial mottling and hyperpigmentation subfoveal, Early age related macular degeneration    Vessels Normal    Periphery Normal           IMAGING AND PROCEDURES  Imaging and Procedures for 03/08/20  OCT, Retina - OU - Both Eyes       Right Eye Quality was good. Scan locations included subfoveal. Central Foveal Thickness: 284. Progression has improved. Findings include vitreomacular adhesion , vitreous traction, no IRF, abnormal foveal contour, pigment epithelial detachment.   Left Eye Quality was good. Scan locations included subfoveal. Central Foveal Thickness: 270. Progression has been stable. Findings include retinal drusen .   Notes Small residual cyst serous subretinal detachment temporal to the fovea much improved though compared to August, OD  OS with incidental posterior vitreous detachment       Intravitreal Injection, Pharmacologic Agent - OD - Right Eye       Time Out 03/08/2020. 2:10 PM. Confirmed correct patient, procedure, site, and patient consented.   Anesthesia Topical anesthesia was used. Anesthetic medications included  Akten 3.5%.   Procedure Preparation included Ofloxacin , Tobramycin 0.3%, 10% betadine to eyelids, 5% betadine to ocular surface. A supplied needle was used.   Injection:  2.5 mg Bevacizumab (  AVASTIN) 2.5mg /0.59mL SOSY   NDC: 27035-009-38, Lot: [id number]   Route: Intravitreal, Site: Right Eye  Post-op Post injection exam found visual acuity of at least counting fingers. The patient tolerated the procedure well. There were no complications. The patient received written and verbal post procedure care education. Post injection medications were not given.                 ASSESSMENT/PLAN:  Exudative age-related macular degeneration of right eye with active choroidal neovascularization (HCC) Improved OD at current 7-week follow-up interval.  Yet still slightly active serous retinal detachment temporal OD.  Repeat injection intravitreal Avastin today and examination in 7 weeks  Vitreomacular adhesion of right eye Currently minor no effect on acuity OD      ICD-10-CM   1. Exudative age-related macular degeneration of right eye with active choroidal neovascularization (HCC)  H35.3211 OCT, Retina - OU - Both Eyes    Intravitreal Injection, Pharmacologic Agent - OD - Right Eye    bevacizumab (AVASTIN) SOSY 2.5 mg  2. Vitreomacular adhesion of right eye  H43.821     1.  OD, vastly improved since onset of disease and therapy August 2021.  Repeat injection today currently at 7-week follow-up and repeat evaluation in right I again in 7 weeks  2.  3.  Ophthalmic Meds Ordered this visit:  Meds ordered this encounter  Medications  . bevacizumab (AVASTIN) SOSY 2.5 mg       Return in about 7 weeks (around 04/26/2020) for dilate, OD, AVASTIN OCT.  There are no Patient Instructions on file for this visit.   Explained the diagnoses, plan, and follow up with the patient and they expressed understanding.  Patient expressed understanding of the importance of proper follow up care.   Alford Highland Zachory Mangual M.D. Diseases & Surgery of the Retina and Vitreous [facility] 03/08/20     Abbreviations: M myopia (nearsighted); A astigmatism; H hyperopia (farsighted); P presbyopia; Mrx spectacle prescription;  CTL contact lenses; OD right eye; OS left eye; OU both eyes  XT exotropia; ET esotropia; PEK punctate epithelial keratitis; PEE punctate epithelial erosions; DES dry eye syndrome; MGD meibomian gland dysfunction; ATs artificial tears; PFAT's preservative free artificial tears; NSC nuclear sclerotic cataract; PSC posterior subcapsular cataract; ERM epi-retinal membrane; PVD posterior vitreous detachment; RD retinal detachment; DM diabetes mellitus; DR diabetic retinopathy; NPDR non-proliferative diabetic retinopathy; PDR proliferative diabetic retinopathy; CSME clinically significant macular edema; DME diabetic macular edema; dbh dot blot hemorrhages; CWS cotton wool spot; POAG primary open angle glaucoma; C/D cup-to-disc ratio; HVF humphrey visual field; GVF goldmann visual field; OCT optical coherence tomography; IOP intraocular pressure; BRVO Branch retinal vein occlusion; CRVO central retinal vein occlusion; CRAO central retinal artery occlusion; BRAO branch retinal artery occlusion; RT retinal tear; SB scleral buckle; PPV pars plana vitrectomy; VH Vitreous hemorrhage; PRP panretinal laser photocoagulation; IVK intravitreal kenalog; VMT vitreomacular traction; MH Macular hole;  NVD neovascularization of the disc; NVE neovascularization elsewhere; AREDS age related eye disease study; ARMD age related macular degeneration; POAG primary open angle glaucoma; EBMD epithelial/anterior basement membrane dystrophy; ACIOL anterior chamber intraocular lens; IOL intraocular lens; PCIOL posterior chamber intraocular lens; Phaco/IOL phacoemulsification with intraocular lens placement; PRK photorefractive keratectomy; LASIK laser assisted in situ keratomileusis; HTN hypertension; DM diabetes  mellitus; COPD chronic obstructive pulmonary disease

## 2020-04-01 ENCOUNTER — Other Ambulatory Visit: Payer: Self-pay | Admitting: Nurse Practitioner

## 2020-04-01 DIAGNOSIS — F3341 Major depressive disorder, recurrent, in partial remission: Secondary | ICD-10-CM

## 2020-04-02 NOTE — Telephone Encounter (Signed)
Please refill patient's prescription YL,RMA 

## 2020-04-26 ENCOUNTER — Ambulatory Visit (INDEPENDENT_AMBULATORY_CARE_PROVIDER_SITE_OTHER): Payer: Medicare Other | Admitting: Ophthalmology

## 2020-04-26 ENCOUNTER — Encounter (INDEPENDENT_AMBULATORY_CARE_PROVIDER_SITE_OTHER): Payer: Self-pay | Admitting: Ophthalmology

## 2020-04-26 ENCOUNTER — Other Ambulatory Visit: Payer: Self-pay

## 2020-04-26 DIAGNOSIS — H353211 Exudative age-related macular degeneration, right eye, with active choroidal neovascularization: Secondary | ICD-10-CM

## 2020-04-26 MED ORDER — BEVACIZUMAB 2.5 MG/0.1ML IZ SOSY
2.5000 mg | PREFILLED_SYRINGE | INTRAVITREAL | Status: AC | PRN
  Administered 2020-04-26: 2.5 mg via INTRAVITREAL

## 2020-04-26 NOTE — Assessment & Plan Note (Signed)
The nature of wet macular degeneration was discussed with the patient.  Forms of therapy reviewed include the use of Anti-VEGF medications injected painlessly into the eye, as well as other possible treatment modalities, including thermal laser therapy. Fellow eye involvement and risks were discussed with the patient. Upon the finding of wet age related macular degeneration, treatment will be offered. The treatment regimen is on a treat as needed basis with the intent to treat if necessary and extend interval of exams when possible. On average 1 out of 6 patients do not need lifetime therapy. However, the risk of recurrent disease is high for a lifetime.  Initially monthly, then periodic, examinations and evaluations will determine whether the next treatment is required on the day of the examination.  OD, visual acuity has been preserved on therapy and stable macular findings by OCT and clinical examination at 7-week follow-up.  Repeat Avastin OD today and examination again in in 8 weeks

## 2020-04-26 NOTE — Progress Notes (Signed)
04/26/2020     CHIEF COMPLAINT Patient presents for Retina Follow Up (7 WK FU OD, POSS AVASTIN OD ///Pt reports that she still has difficulty reading small print. Pt denies any new F/F, pain, or pressure OU. )   HISTORY OF PRESENT ILLNESS: [name] is a 80 y.o. female who presents to the clinic today for:   HPI    Retina Follow Up    Patient presents with  Wet AMD.  In right eye. Additional comments: 7 WK FU OD, POSS AVASTIN OD    Pt reports that she still has difficulty reading small print. Pt denies any new F/F, pain, or pressure OU.        Last edited by Varney Biles D on 04/26/2020  3:03 PM. (History)      Referring physician: Arnette Felts, FNP [address] [place],  [place] [address]  HISTORICAL INFORMATION:   Selected notes from the MEDICAL RECORD NUMBER    Lab Results  Component Value Date   HGBA1C 5.5 10/27/2019     CURRENT MEDICATIONS: Current Outpatient Medications (Ophthalmic Drugs)  Medication Sig  . Polyethyl Glycol-Propyl Glycol (SYSTANE OP) Apply to eye. As needed   No current facility-administered medications for this visit. (Ophthalmic Drugs)   Current Outpatient Medications (Other)  Medication Sig  . acetaminophen (TYLENOL) 500 MG tablet Take 1,000 mg by mouth every 6 (six) hours as needed.  Marland Kitchen amoxicillin (AMOXIL) 875 MG tablet Take 1 tablet (875 mg total) by mouth 2 (two) times daily.  . Boswellia-Glucosamine-Vit D (OSTEO BI-FLEX ONE PER DAY PO) Take by mouth.  . Calcium Carbonate-Vit D-Min (CALCIUM 1200 PO) Take 1 capsule by mouth daily.  . cetirizine (ZYRTEC) 10 MG tablet Take 10 mg by mouth daily.  . Cholecalciferol (VITAMIN D) 2000 units tablet Take 2,000 Units by mouth daily.  . clopidogrel (PLAVIX) 75 MG tablet TAKE 1 TABLET BY MOUTH EVERY DAY  . fluticasone (FLONASE) 50 MCG/ACT nasal spray Place into both nostrils daily as needed for allergies or rhinitis.  . Omega-3 Fatty Acids (FISH OIL PO) Take 1 capsule by  mouth daily. Takes it sometimes  . pregabalin (LYRICA) 25 MG capsule TAKE 1 CAPSULE BY MOUTH THREE TIMES A DAY  . sertraline (ZOLOFT) 25 MG tablet TAKE 1 TABLET BY MOUTH EVERY DAY  . topiramate (TOPAMAX) 25 MG tablet Take one tablet in the morning and 3 in the evening  . vitamin C (ASCORBIC ACID) 250 MG tablet Take 250 mg by mouth daily.    No current facility-administered medications for this visit. (Other)      REVIEW OF SYSTEMS:    ALLERGIES Allergies  Allergen Reactions  . Aspirin Anaphylaxis and Hives  . Elavil [Amitriptyline] Anaphylaxis  . Latex   . Relafen [Nabumetone] Hives    PAST MEDICAL HISTORY Past Medical History:  Diagnosis Date  . Abnormality of gait 04/10/2015  . Cervical spondylosis without myelopathy 07/18/2013  . Common migraine with intractable migraine 04/10/2015  . DJD (degenerative joint disease)   . Fibromyalgia   . Hiatal hernia   . Migraine headache    Past Surgical History:  Procedure Laterality Date  . ABDOMINAL HYSTERECTOMY    . DILATION AND CURETTAGE OF UTERUS    . HIATAL HERNIA REPAIR    . TONSILLECTOMY      FAMILY HISTORY Family History  Problem Relation Age of Onset  . CVA Mother   . Stroke Father   . Emphysema Father     SOCIAL  HISTORY Social History   Tobacco Use  . Smoking status: Never Smoker  . Smokeless tobacco: Never Used  Vaping Use  . Vaping Use: Never used  Substance Use Topics  . Alcohol use: No    Comment: very occasional  . Drug use: No         OPHTHALMIC EXAM:  Base Eye Exam    Visual Acuity (ETDRS)      Right Left   Dist [place] 20/40 +2 20/40 +2   Dist ph [place] 20/30 -1 20/25 -1       Neuro/Psych    Oriented x3: Yes   Mood/Affect: Normal        Slit Lamp and Fundus Exam    External Exam      Right Left   External Normal Normal       Slit Lamp Exam      Right Left   Lids/Lashes Normal Normal   Conjunctiva/Sclera White and quiet White and quiet   Cornea Clear Clear   Anterior Chamber  Deep and quiet Deep and quiet   Iris Round and reactive Round and reactive   Lens 2+ Nuclear sclerosis 2+ Nuclear sclerosis   Anterior Vitreous Normal Normal       Fundus Exam      Right Left   Posterior Vitreous Central vitreous floaters    Disc Normal    C/D Ratio 0.2    Macula Macular thickening, Smaller Subretinal neovascular membrane, Retinal pigment epithelial mottling and hyperpigmentation subfoveal, Early age related macular degeneration    Vessels Normal    Periphery Normal           IMAGING AND PROCEDURES  Imaging and Procedures for 04/26/20  Intravitreal Injection, Pharmacologic Agent - OD - Right Eye       Time Out 04/26/2020. 3:10 PM. Confirmed correct patient, procedure, site, and patient consented.   Anesthesia Topical anesthesia was used. Anesthetic medications included Akten 3.5%.   Procedure Preparation included Ofloxacin , Tobramycin 0.3%, 10% betadine to eyelids, 5% betadine to ocular surface. A supplied needle was used.   Injection:  2.5 mg Bevacizumab (AVASTIN) 2.5mg /0.65mL SOSY   NDC: 16109-604-54, Lot: 0   Route: Intravitreal, Site: Right Eye  Post-op Post injection exam found visual acuity of at least counting fingers. The patient tolerated the procedure well. There were no complications. The patient received written and verbal post procedure care education. Post injection medications were not given.        OCT, Retina - OU - Both Eyes       Right Eye Quality was good. Scan locations included subfoveal. Central Foveal Thickness: 284. Progression has improved. Findings include vitreomacular adhesion , vitreous traction, no IRF, abnormal foveal contour, pigment epithelial detachment.   Left Eye Quality was good. Scan locations included subfoveal. Central Foveal Thickness: 270. Progression has been stable. Findings include retinal drusen .   Notes Small residual cyst serous subretinal detachment temporal to the fovea much improved though  compared to August, OD  OS with incidental posterior vitreous detachment                ASSESSMENT/PLAN:  Exudative age-related macular degeneration of right eye with active choroidal neovascularization (HCC) The nature of wet macular degeneration was discussed with the patient.  Forms of therapy reviewed include the use of Anti-VEGF medications injected painlessly into the eye, as well as other possible treatment modalities, including thermal laser therapy. Fellow eye involvement and risks were discussed with the patient. Upon the  finding of wet age related macular degeneration, treatment will be offered. The treatment regimen is on a treat as needed basis with the intent to treat if necessary and extend interval of exams when possible. On average 1 out of 6 patients do not need lifetime therapy. However, the risk of recurrent disease is high for a lifetime.  Initially monthly, then periodic, examinations and evaluations will determine whether the next treatment is required on the day of the examination.  OD, visual acuity has been preserved on therapy and stable macular findings by OCT and clinical examination at 7-week follow-up.  Repeat Avastin OD today and examination again in in 8 weeks      ICD-10-CM   1. Exudative age-related macular degeneration of right eye with active choroidal neovascularization (HCC)  H35.3211 Intravitreal Injection, Pharmacologic Agent - OD - Right Eye    bevacizumab (AVASTIN) SOSY 2.5 mg    1.  Intravitreal Avastin OD today, currently at 7-week follow-up.  Macular anatomy is abnormal yet improved from baseline.  Visual acuity has remained stable.  We will attempt extend next interval to 8-week examination.  2.  3.  Ophthalmic Meds Ordered this visit:  Meds ordered this encounter  Medications  . bevacizumab (AVASTIN) SOSY 2.5 mg       Return in about 8 weeks (around 06/21/2020) for DILATE OU, AVASTIN OCT, OD.  There are no Patient Instructions on  file for this visit.   Explained the diagnoses, plan, and follow up with the patient and they expressed understanding.  Patient expressed understanding of the importance of proper follow up care.   Alford Highland Correll Denbow M.D. Diseases & Surgery of the Retina and Vitreous [facility] 04/26/20     Abbreviations: M myopia (nearsighted); A astigmatism; H hyperopia (farsighted); P presbyopia; Mrx spectacle prescription;  CTL contact lenses; OD right eye; OS left eye; OU both eyes  XT exotropia; ET esotropia; PEK punctate epithelial keratitis; PEE punctate epithelial erosions; DES dry eye syndrome; MGD meibomian gland dysfunction; ATs artificial tears; PFAT's preservative free artificial tears; NSC nuclear sclerotic cataract; PSC posterior subcapsular cataract; ERM epi-retinal membrane; PVD posterior vitreous detachment; RD retinal detachment; DM diabetes mellitus; DR diabetic retinopathy; NPDR non-proliferative diabetic retinopathy; PDR proliferative diabetic retinopathy; CSME clinically significant macular edema; DME diabetic macular edema; dbh dot blot hemorrhages; CWS cotton wool spot; POAG primary open angle glaucoma; C/D cup-to-disc ratio; HVF humphrey visual field; GVF goldmann visual field; OCT optical coherence tomography; IOP intraocular pressure; BRVO Branch retinal vein occlusion; CRVO central retinal vein occlusion; CRAO central retinal artery occlusion; BRAO branch retinal artery occlusion; RT retinal tear; SB scleral buckle; PPV pars plana vitrectomy; VH Vitreous hemorrhage; PRP panretinal laser photocoagulation; IVK intravitreal kenalog; VMT vitreomacular traction; MH Macular hole;  NVD neovascularization of the disc; NVE neovascularization elsewhere; AREDS age related eye disease study; ARMD age related macular degeneration; POAG primary open angle glaucoma; EBMD epithelial/anterior basement membrane dystrophy; ACIOL anterior chamber intraocular lens; IOL intraocular lens; PCIOL  posterior chamber intraocular lens; Phaco/IOL phacoemulsification with intraocular lens placement; PRK photorefractive keratectomy; LASIK laser assisted in situ keratomileusis; HTN hypertension; DM diabetes mellitus; COPD chronic obstructive pulmonary disease

## 2020-05-01 ENCOUNTER — Other Ambulatory Visit: Payer: Self-pay

## 2020-05-01 ENCOUNTER — Encounter: Payer: Self-pay | Admitting: Nurse Practitioner

## 2020-05-01 ENCOUNTER — Ambulatory Visit (INDEPENDENT_AMBULATORY_CARE_PROVIDER_SITE_OTHER): Payer: Medicare Other | Admitting: Nurse Practitioner

## 2020-05-01 VITALS — BP 116/60 | HR 58 | Temp 98.3°F | Ht 63.0 in | Wt 160.6 lb

## 2020-05-01 DIAGNOSIS — I2510 Atherosclerotic heart disease of native coronary artery without angina pectoris: Secondary | ICD-10-CM | POA: Diagnosis not present

## 2020-05-01 DIAGNOSIS — W19XXXA Unspecified fall, initial encounter: Secondary | ICD-10-CM | POA: Diagnosis not present

## 2020-05-01 DIAGNOSIS — Z8673 Personal history of transient ischemic attack (TIA), and cerebral infarction without residual deficits: Secondary | ICD-10-CM | POA: Diagnosis not present

## 2020-05-01 DIAGNOSIS — R413 Other amnesia: Secondary | ICD-10-CM

## 2020-05-01 DIAGNOSIS — F3341 Major depressive disorder, recurrent, in partial remission: Secondary | ICD-10-CM | POA: Diagnosis not present

## 2020-05-01 LAB — POCT URINALYSIS DIPSTICK
Bilirubin, UA: NEGATIVE
Glucose, UA: NEGATIVE
Ketones, UA: NEGATIVE
Nitrite, UA: POSITIVE
Protein, UA: NEGATIVE
Spec Grav, UA: 1.02 (ref 1.010–1.025)
Urobilinogen, UA: 0.2 E.U./dL
pH, UA: 5.5 (ref 5.0–8.0)

## 2020-05-01 MED ORDER — SERTRALINE HCL 50 MG PO TABS: 50.0000 mg | ORAL_TABLET | Freq: Every day | ORAL | 2 refills | Status: DC

## 2020-05-01 NOTE — Progress Notes (Signed)
I,Yamilka Roman [employer] as a Education administrator for [employer], FNP.,have documented all relevant documentation on the behalf of [name], FNP,as directed by  [name], FNP while in the presence of [name], [place]. This visit occurred during the SARS-CoV-2 public health emergency.  Safety protocols were in place, including screening questions prior to the visit, additional usage of staff PPE, and extensive cleaning of exam room while observing appropriate contact time as indicated for disinfecting solutions.  Subjective:     Patient ID: [name] , female    DOB: 1941-02-12 , 80 y.o.   MRN: [id number]   Chief Complaint  Patient presents with  . Depression    Patient stated she has been unable to sleep.   . Fall    Patient's daughter has been falling a little more frequently     HPI  Patient presents today for a f/u on her depression.  Her granddaughter lives with her.  She feels like she is up more than in the bed asleep. Occasionally will nap during the day.  Her daughter feels she is lonely and increased stress. She has not been getting out as much since covid.  She has made statements to her daughter indicating wanting to be with her husband.   She has had multiple falls in her life and her daughter is concerned about her memory state. Her daughter reports she has been forgetting more. She paid her taxes late this year.  Her daughters name is Arnoldo Hooker.  She continues to drive.  The only time she does not drive is when she has an injection to her eye.    Patients mother had Alzheimers at the end.    Depression        This is a chronic problem.  The current episode started more than 1 month ago.   The onset quality is gradual.   Associated symptoms include no decreased concentration and no fatigue.     The symptoms are aggravated by nothing.   Pertinent negatives include no chronic fatigue syndrome, no chronic pain and no anxiety.    Past Medical History:  Diagnosis Date   . Abnormality of gait 04/10/2015  . Cervical spondylosis without myelopathy 07/18/2013  . Common migraine with intractable migraine 04/10/2015  . DJD (degenerative joint disease)   . Fibromyalgia   . Hiatal hernia   . Migraine headache      Family History  Problem Relation Age of Onset  . CVA Mother   . Stroke Father   . Emphysema Father      Current Outpatient Medications:  .  acetaminophen (TYLENOL) 500 MG tablet, Take 1,000 mg by mouth every 6 (six) hours as needed., Disp: , Rfl:  .  Boswellia-Glucosamine-Vit D (OSTEO BI-FLEX ONE PER DAY PO), Take by mouth., Disp: , Rfl:  .  Calcium Carbonate-Vit D-Min (CALCIUM 1200 PO), Take 1 capsule by mouth daily., Disp: , Rfl:  .  cetirizine (ZYRTEC) 10 MG tablet, Take 10 mg by mouth daily., Disp: , Rfl:  .  Cholecalciferol (VITAMIN D) 2000 units tablet, Take 2,000 Units by mouth daily., Disp: , Rfl:  .  clopidogrel (PLAVIX) 75 MG tablet, TAKE 1 TABLET BY MOUTH EVERY DAY, Disp: 90 tablet, Rfl: 1 .  fluticasone (FLONASE) 50 MCG/ACT nasal spray, Place into both nostrils daily as needed for allergies or rhinitis., Disp: , Rfl:  .  Omega-3 Fatty Acids (FISH OIL PO), Take 1 capsule by mouth daily. Takes it sometimes, Disp: , Rfl:  .  Polyethyl Glycol-Propyl Glycol (SYSTANE OP), Apply to eye. As needed, Disp: , Rfl:  .  vitamin C (ASCORBIC ACID) 250 MG tablet, Take 250 mg by mouth daily. , Disp: , Rfl:  .  pregabalin (LYRICA) 25 MG capsule, Take 1 capsule 3 times daily, Disp: 270 capsule, Rfl: 1 .  sertraline (ZOLOFT) 50 MG tablet, TAKE 1 TABLET BY MOUTH EVERY DAY, Disp: 90 tablet, Rfl: 1 .  topiramate (TOPAMAX) 25 MG tablet, Take one tablet in the morning and 3 in the evening, Disp: 360 tablet, Rfl: 3   Allergies  Allergen Reactions  . Aspirin Anaphylaxis and Hives  . Elavil [Amitriptyline] Anaphylaxis  . Latex   . Relafen [Nabumetone] Hives     Review of Systems  Constitutional: Negative.  Negative for fatigue.  HENT: Negative.   Eyes:  Negative.   Respiratory: Negative.   Cardiovascular: Negative.   Gastrointestinal: Negative.   Endocrine: Negative.   Genitourinary: Negative.   Musculoskeletal: Negative.   Skin: Negative.   Hematological: Negative.   Psychiatric/Behavioral: Positive for depression. Negative for decreased concentration.     Today's Vitals   05/01/20 0957  BP: 116/60  Pulse: (!) 58  Temp: 98.3 F (36.8 C)  TempSrc: Oral  Weight: 160 lb 9.6 oz (72.8 kg)  Height: '5\' 3"'  (1.6 m)  PainSc: 5   PainLoc: Leg   Body mass index is 28.45 kg/m.   Objective:  Physical Exam Constitutional:      General: She is not in acute distress.    Appearance: Normal appearance. She is normal weight.  Cardiovascular:     Rate and Rhythm: Normal rate and regular rhythm.     Pulses: Normal pulses.     Heart sounds: Normal heart sounds.  Pulmonary:     Effort: Pulmonary effort is normal.     Breath sounds: Normal breath sounds.  Abdominal:     General: Abdomen is flat. Bowel sounds are normal.     Palpations: Abdomen is soft.  Musculoskeletal:        General: Normal range of motion.     Cervical back: Normal range of motion and neck supple.  Skin:    General: Skin is warm and dry.     Capillary Refill: Capillary refill takes less than 2 seconds.  Neurological:     General: No focal deficit present.     Mental Status: She is alert and oriented to person, place, and time.  Psychiatric:        Mood and Affect: Mood normal.        Behavior: Behavior normal.        Thought Content: Thought content normal.        Judgment: Judgment normal.         Assessment And Plan:     1. Recurrent major depressive disorder, in partial remission (HCC)  Chronic, will check labs to see if any changes in thyroid  Will increase sertraline to 50 mg daily as well Depression screen [facility] 2/9 05/01/2020 10/27/2019 06/28/2019 06/28/2019 02/24/2019  Decreased Interest 0 0 0 0 0  Down, Depressed, Hopeless 1 0 0 0 0  PHQ - 2 Score  1 0 0 0 0  Altered sleeping 3 0 0 - 0  Tired, decreased energy 0 0 0 - 0  Change in appetite 0 0 0 - 0  Feeling bad or failure about yourself  1 0 0 - 0  Trouble concentrating - 0 0 - 1  Moving slowly or fidgety/restless 0  0 0 - 0  Suicidal thoughts 0 0 0 - 0  PHQ-9 Score 5 0 0 - 1  Difficult doing work/chores Somewhat difficult Not difficult at all Not difficult at all - Not difficult at all  Some recent data might be hidden    - CBC - CMP14+EGFR  2. Memory changes  Will check for metabolic causes this can also be related to worsening depression - TSH - Vitamin B12 - T pallidum Screening Cascade - CT Head Wo Contrast; Future - CMP14+EGFR - POCT Urinalysis Dipstick (81002)  3. Atherosclerotic cardiovascular disease  Will check lipid panel  Encouraged to avoid fried and fatty foods. - Lipid panel  4. History of TIA (transient ischemic attack)  With her changes in mentation and frequent falls will check CT scan of head without contrast - CT Head Wo Contrast; Future   5. Fall, initial encounter  Having more falls without injury    Patient was given opportunity to ask questions. Patient verbalized understanding of the plan and was able to repeat key elements of the plan. All questions were answered to their satisfaction.  [name], FNP   I, [name], FNP, have reviewed all documentation for this visit. The documentation on 06/02/20 for the exam, diagnosis, procedures, and orders are all accurate and complete.  THE PATIENT IS ENCOURAGED TO PRACTICE SOCIAL DISTANCING DUE TO THE COVID-19 PANDEMIC.

## 2020-05-01 NOTE — Patient Instructions (Addendum)
Take over the counter melatonin daily to help with your sleep Take magnesium 250 mg evening meal daily They will call you for your CT scan of your head

## 2020-05-02 ENCOUNTER — Other Ambulatory Visit: Payer: Self-pay | Admitting: Nurse Practitioner

## 2020-05-02 DIAGNOSIS — N3000 Acute cystitis without hematuria: Secondary | ICD-10-CM

## 2020-05-02 LAB — CMP14+EGFR
ALT: 6 IU/L (ref 0–32)
AST: 13 IU/L (ref 0–40)
Albumin/Globulin Ratio: 1.9 (ref 1.2–2.2)
Albumin: 4.4 g/dL (ref 3.7–4.7)
Alkaline Phosphatase: 83 IU/L (ref 44–121)
BUN/Creatinine Ratio: 24 (ref 12–28)
BUN: 20 mg/dL (ref 8–27)
Bilirubin Total: 0.3 mg/dL (ref 0.0–1.2)
CO2: 21 mmol/L (ref 20–29)
Calcium: 9.6 mg/dL (ref 8.7–10.3)
Chloride: 104 mmol/L (ref 96–106)
Creatinine, Ser: 0.84 mg/dL (ref 0.57–1.00)
GFR calc Af Amer: 77 mL/min/{1.73_m2} (ref >59)
GFR calc non Af Amer: 67 mL/min/{1.73_m2} (ref >59)
Globulin, Total: 2.3 g/dL (ref 1.5–4.5)
Glucose: 92 mg/dL (ref 65–99)
Potassium: 4.5 mmol/L (ref 3.5–5.2)
Sodium: 142 mmol/L (ref 134–144)
Total Protein: 6.7 g/dL (ref 6.0–8.5)

## 2020-05-02 LAB — CBC
Hematocrit: 44.2 % (ref 34.0–46.6)
Hemoglobin: 14.8 g/dL (ref 11.1–15.9)
MCH: 31.1 pg (ref 26.6–33.0)
MCHC: 33.5 g/dL (ref 31.5–35.7)
MCV: 93 fL (ref 79–97)
Platelets: 238 10*3/uL (ref 150–450)
RBC: 4.76 x10E6/uL (ref 3.77–5.28)
RDW: 12.5 % (ref 11.7–15.4)
WBC: 4.5 10*3/uL (ref 3.4–10.8)

## 2020-05-02 LAB — LIPID PANEL
Chol/HDL Ratio: 3.3 ratio (ref 0.0–4.4)
Cholesterol, Total: 187 mg/dL (ref 100–199)
HDL: 56 mg/dL (ref >39)
LDL Chol Calc (NIH): 103 mg/dL — ABNORMAL HIGH (ref 0–99)
Triglycerides: 164 mg/dL — ABNORMAL HIGH (ref 0–149)
VLDL Cholesterol Cal: 28 mg/dL (ref 5–40)

## 2020-05-02 LAB — TSH: TSH: 4.05 u[IU]/mL (ref 0.450–4.500)

## 2020-05-02 LAB — T PALLIDUM SCREENING CASCADE: T pallidum Antibodies (TP-PA): NONREACTIVE

## 2020-05-02 LAB — VITAMIN B12: Vitamin B-12: 245 pg/mL (ref 232–1245)

## 2020-05-02 MED ORDER — NITROFURANTOIN MONOHYD MACRO 100 MG PO CAPS: 100.0000 mg | ORAL_CAPSULE | Freq: Two times a day (BID) | ORAL | 0 refills | Status: AC

## 2020-05-08 ENCOUNTER — Other Ambulatory Visit: Payer: Self-pay

## 2020-05-08 ENCOUNTER — Ambulatory Visit (INDEPENDENT_AMBULATORY_CARE_PROVIDER_SITE_OTHER): Payer: Medicare Other

## 2020-05-08 VITALS — BP 116/60 | Temp 98.1°F | Ht 64.0 in | Wt 158.0 lb

## 2020-05-08 DIAGNOSIS — E538 Deficiency of other specified B group vitamins: Secondary | ICD-10-CM | POA: Diagnosis not present

## 2020-05-08 MED ORDER — CYANOCOBALAMIN 1000 MCG/ML IJ SOLN
1000.0000 ug | Freq: Once | INTRAMUSCULAR | Status: AC
  Administered 2020-05-08: 1000 ug via INTRAMUSCULAR

## 2020-05-08 NOTE — Progress Notes (Signed)
Pt is here for b12 injection. 

## 2020-05-15 ENCOUNTER — Ambulatory Visit (INDEPENDENT_AMBULATORY_CARE_PROVIDER_SITE_OTHER): Payer: Medicare Other

## 2020-05-15 ENCOUNTER — Other Ambulatory Visit: Payer: Self-pay

## 2020-05-15 VITALS — BP 116/68 | HR 66 | Temp 97.7°F | Ht 64.0 in | Wt 158.2 lb

## 2020-05-15 DIAGNOSIS — E538 Deficiency of other specified B group vitamins: Secondary | ICD-10-CM | POA: Diagnosis not present

## 2020-05-15 MED ORDER — CYANOCOBALAMIN 1000 MCG/ML IJ SOLN
1000.0000 ug | Freq: Once | INTRAMUSCULAR | Status: AC
  Administered 2020-05-15: 1000 ug via INTRAMUSCULAR

## 2020-05-15 NOTE — Progress Notes (Signed)
PATIENT: [name] DOB: 1940/05/02  REASON FOR VISIT: follow up HISTORY FROM: patient  HISTORY OF PRESENT ILLNESS: Today 05/16/20  [name] is a 80 year old female with history of fibromyalgia and headaches.  She remains on Lyrica and Topamax.  Headaches well controlled, usually 2 a month, will take Tylenol and lay down with good benefit.  Has chronic right knee pain, low back pain.  Has good and bad days with fibromyalgia, worse with weather change.  Uses a quad cane, no falls of recent.  Her granddaughter lives with her.  She drives a car.  Indicates she does her own ADLs, housework, activities on her own.  Having CT head scan tomorrow, ordered by PCP, family was concerned with memory. She does not think necessary.  Her Zoloft has been increased.  Has been started on B12 injections.  Is on Plavix.  Here today for evaluation unaccompanied.  Update 05/18/2019 SS: [name] is a 80 year old female with history of fibromyalgia and headache.  She remains on Lyrica 25 mg 3 times a day.  She is also taking Topamax.  She takes Tylenol with good benefit.  Headaches are so infrequent, she does not even keep track of them.  She sleeps well.  She has chronic right knee pain, that is bothersome.  She has low back pain, that may radiate down the right leg with her fibromyalgia.  Lyrica is helpful, some days, she may only take 25 mg twice a day, for bad days she will take 3 times a day.  She has not been as active as she usually is.  She has not had any falls.  She uses a quad cane.  She drives a car, her granddaughter lives with her.  She denies any new problems or concerns.  She presents today for evaluation unaccompanied.  HISTORY 11/16/2018 SS: [name] is a 80 year old female with history of fibromyalgia and headache.  She remains on Lyrica 25 mg 3 times a day and this is providing adequate management of her discomfort and pain.  She does report pain and swelling to her right knee for several  months.  She says she will be going to see an orthopedic doctor when she returns from vacation.  She reports her headaches have improved with the increased dose of Topamax.  She remains on Topamax 25 mg in the morning, 75 mg at bedtime.  She reports on average she may have 4 headaches a month.  She will take Tylenol and lie down with headache relief.  She reports her granddaughter currently lives with her.  She is able to perform all of her daily activities independently.  She does drive a car without difficulty.  She is tolerating her medications well.  She presents today for follow-up unaccompanied.   REVIEW OF SYSTEMS: Out of a complete 14 system review of symptoms, the patient complains only of the following symptoms, and all other reviewed systems are negative.  Headache  ALLERGIES: Allergies  Allergen Reactions   Aspirin Anaphylaxis and Hives   Elavil [Amitriptyline] Anaphylaxis   Latex    Relafen [Nabumetone] Hives    HOME MEDICATIONS: Outpatient Medications Prior to Visit  Medication Sig Dispense Refill   acetaminophen (TYLENOL) 500 MG tablet Take 1,000 mg by mouth every 6 (six) hours as needed.     Boswellia-Glucosamine-Vit D (OSTEO BI-FLEX ONE PER DAY PO) Take by mouth.     Calcium Carbonate-Vit D-Min (CALCIUM 1200 PO) Take 1 capsule by mouth daily.  cetirizine (ZYRTEC) 10 MG tablet Take 10 mg by mouth daily.     Cholecalciferol (VITAMIN D) 2000 units tablet Take 2,000 Units by mouth daily.     clopidogrel (PLAVIX) 75 MG tablet TAKE 1 TABLET BY MOUTH EVERY DAY 90 tablet 1   fluticasone (FLONASE) 50 MCG/ACT nasal spray Place into both nostrils daily as needed for allergies or rhinitis.     Omega-3 Fatty Acids (FISH OIL PO) Take 1 capsule by mouth daily. Takes it sometimes     Polyethyl Glycol-Propyl Glycol (SYSTANE OP) Apply to eye. As needed     pregabalin (LYRICA) 25 MG capsule TAKE 1 CAPSULE BY MOUTH THREE TIMES A DAY 270 capsule 1   sertraline (ZOLOFT) 50 MG  tablet Take 1 tablet (50 mg total) by mouth daily. 30 tablet 2   topiramate (TOPAMAX) 25 MG tablet Take one tablet in the morning and 3 in the evening 360 tablet 3   vitamin C (ASCORBIC ACID) 250 MG tablet Take 250 mg by mouth daily.      No facility-administered medications prior to visit.    PAST MEDICAL HISTORY: Past Medical History:  Diagnosis Date   Abnormality of gait 04/10/2015   Cervical spondylosis without myelopathy 07/18/2013   Common migraine with intractable migraine 04/10/2015   DJD (degenerative joint disease)    Fibromyalgia    Hiatal hernia    Migraine headache     PAST SURGICAL HISTORY: Past Surgical History:  Procedure Laterality Date   ABDOMINAL HYSTERECTOMY     DILATION AND CURETTAGE OF UTERUS     HIATAL HERNIA REPAIR     TONSILLECTOMY      FAMILY HISTORY: Family History  Problem Relation Age of Onset   CVA Mother    Stroke Father    Emphysema Father     SOCIAL HISTORY: Social History   Socioeconomic History   Marital status: Widowed    Spouse name: Not on file   Number of children: 5   Years of education: college   Highest education level: Not on file  Occupational History   Occupation: Retired  Tobacco Use   Smoking status: Never Smoker   Smokeless tobacco: Never Used  Building services engineer Use: Never used  Substance and Sexual Activity   Alcohol use: No    Comment: very occasional   Drug use: No   Sexual activity: Not Currently  Other Topics Concern   Not on file  Social History Narrative   Patient is married with 5 children.   Patient is right handed.   Patient has college education.   Patient drinks 3 cups daily.         Social Determinants of Health   Financial Resource Strain: Not on file  Food Insecurity: Not on file  Transportation Needs: Not on file  Physical Activity: Not on file  Stress: Not on file  Social Connections: Not on file  Intimate Partner Violence: Not on file       PHYSICAL EXAM  Vitals:   05/16/20 1125  BP: 102/62  Pulse: 65  Weight: 159 lb (72.1 kg)  Height: 5\' 4"  (1.626 m)   Body mass index is 27.29 kg/m.  Generalized: Well developed, in no acute distress   Neurological examination  Mentation: Alert oriented to time, place, history taking. Follows all commands speech and language fluent, pleasant  Cranial nerve II-XII: Pupils were equal round reactive to light. Extraocular movements were full, visual field were full on confrontational test. Facial sensation and  strength were normal. Head turning and shoulder shrug were normal and symmetric. Motor: Good strength of all extremities Sensory: Sensory testing is intact to soft touch on all 4 extremities. No evidence of extinction is noted.  Coordination: Cerebellar testing reveals good finger-nose-finger and heel-to-shin bilaterally.  Gait and station: Able to rise from seated position without pushoff, gait is normal, steady, has a quad cane to use. Reflexes: Deep tendon reflexes are symmetric   DIAGNOSTIC DATA (LABS, IMAGING, TESTING) - I reviewed patient records, labs, notes, testing and imaging myself where available.  Lab Results  Component Value Date   WBC 4.5 05/01/2020   HGB 14.8 05/01/2020   HCT 44.2 05/01/2020   MCV 93 05/01/2020   PLT 238 05/01/2020      Component Value Date/Time   NA 142 05/01/2020 1115   K 4.5 05/01/2020 1115   CL 104 05/01/2020 1115   CO2 21 05/01/2020 1115   GLUCOSE 92 05/01/2020 1115   GLUCOSE 95 05/30/2013 0629   BUN 20 05/01/2020 1115   CREATININE 0.84 05/01/2020 1115   CALCIUM 9.6 05/01/2020 1115   PROT 6.7 05/01/2020 1115   ALBUMIN 4.4 05/01/2020 1115   AST 13 05/01/2020 1115   ALT 6 05/01/2020 1115   ALKPHOS 83 05/01/2020 1115   BILITOT 0.3 05/01/2020 1115   GFRNONAA 67 05/01/2020 1115   GFRAA 77 05/01/2020 1115   Lab Results  Component Value Date   CHOL 187 05/01/2020   HDL 56 05/01/2020   LDLCALC 103 (H) 05/01/2020   TRIG  164 (H) 05/01/2020   CHOLHDL 3.3 05/01/2020   Lab Results  Component Value Date   HGBA1C 5.5 10/27/2019   Lab Results  Component Value Date   VITAMINB12 245 05/01/2020    ASSESSMENT AND PLAN 80 y.o. year old female  has a past medical history of Abnormality of gait (04/10/2015), Cervical spondylosis without myelopathy (07/18/2013), Common migraine with intractable migraine (04/10/2015), DJD (degenerative joint disease), Fibromyalgia, Hiatal hernia, and Migraine headache. here with:  1.  Fibromyalgia 2.  Migraine headache  -Remains overall stable -Will continue Lyrica and Topamax for chronic conditions -Refills were sent, drug registry checked, last fill on Lyrica 02/19/2020 #270 -Continue routine follow-up with PCP -Follow-up in our office in 1 year or sooner if needed  I spent 20 minutes of face-to-face and non-face-to-face time with patient.  This included previsit chart review, lab review, study review, order entry, electronic health record documentation, patient education.  Margie Ege, AGNP-C, DNP 05/16/2020, 11:41 AM [facility] [address], [address] [place], [place] [address] [phone]

## 2020-05-15 NOTE — Progress Notes (Signed)
Pt is here for b12 injection. 

## 2020-05-16 ENCOUNTER — Encounter: Payer: Self-pay | Admitting: Neurology

## 2020-05-16 ENCOUNTER — Ambulatory Visit: Payer: Medicare Other | Admitting: Neurology

## 2020-05-16 VITALS — BP 102/62 | HR 65 | Ht 64.0 in | Wt 159.0 lb

## 2020-05-16 DIAGNOSIS — R519 Headache, unspecified: Secondary | ICD-10-CM | POA: Diagnosis not present

## 2020-05-16 DIAGNOSIS — M797 Fibromyalgia: Secondary | ICD-10-CM | POA: Diagnosis not present

## 2020-05-16 MED ORDER — TOPIRAMATE 25 MG PO TABS: ORAL_TABLET | ORAL | 3 refills | Status: DC

## 2020-05-16 MED ORDER — PREGABALIN 25 MG PO CAPS: ORAL_CAPSULE | ORAL | 1 refills | Status: DC

## 2020-05-16 NOTE — Patient Instructions (Signed)
We will continue current medications Get your CT scan tomorrow See you back in 1 year or sooner if needed

## 2020-05-17 ENCOUNTER — Ambulatory Visit
Admission: RE | Admit: 2020-05-17 | Discharge: 2020-05-17 | Disposition: A | Discharge status: Home / Self Care | Payer: Medicare Other | Source: Ambulatory Visit | Attending: Nurse Practitioner | Admitting: Nurse Practitioner

## 2020-05-17 DIAGNOSIS — R413 Other amnesia: Secondary | ICD-10-CM | POA: Diagnosis not present

## 2020-05-17 DIAGNOSIS — Z8673 Personal history of transient ischemic attack (TIA), and cerebral infarction without residual deficits: Secondary | ICD-10-CM

## 2020-05-20 NOTE — Progress Notes (Signed)
I have read the note, and I agree with the clinical assessment and plan.  Phelan Schadt K Canna Nickelson   

## 2020-05-21 DIAGNOSIS — Z1231 Encounter for screening mammogram for malignant neoplasm of breast: Secondary | ICD-10-CM | POA: Diagnosis not present

## 2020-05-21 LAB — HM MAMMOGRAPHY

## 2020-05-22 ENCOUNTER — Ambulatory Visit (INDEPENDENT_AMBULATORY_CARE_PROVIDER_SITE_OTHER): Payer: Medicare Other

## 2020-05-22 ENCOUNTER — Ambulatory Visit: Payer: Self-pay

## 2020-05-22 ENCOUNTER — Encounter: Payer: Self-pay | Admitting: Nurse Practitioner

## 2020-05-22 ENCOUNTER — Other Ambulatory Visit: Payer: Self-pay

## 2020-05-22 VITALS — BP 116/78 | HR 62 | Temp 98.4°F | Ht 64.0 in | Wt 158.4 lb

## 2020-05-22 DIAGNOSIS — E538 Deficiency of other specified B group vitamins: Secondary | ICD-10-CM

## 2020-05-22 MED ORDER — CYANOCOBALAMIN 1000 MCG/ML IJ SOLN
1000.0000 ug | Freq: Once | INTRAMUSCULAR | Status: AC
  Administered 2020-05-22: 1000 ug via INTRAMUSCULAR

## 2020-05-22 NOTE — Progress Notes (Signed)
She needs to keep her blood pressure under good control.

## 2020-05-22 NOTE — Progress Notes (Signed)
Pt is here for b12 injection. 

## 2020-05-23 ENCOUNTER — Telehealth: Payer: Self-pay | Admitting: Nurse Practitioner

## 2020-05-23 NOTE — Telephone Encounter (Signed)
Left message for patient to call back and schedule Medicare Annual Wellness Visit (AWV) either virtually or in office. No detailed message left    Last AWV 08/24/2018  please schedule at anytime with [facility]    This should be a 45 minute visit.

## 2020-05-24 ENCOUNTER — Other Ambulatory Visit: Payer: Self-pay | Admitting: Nurse Practitioner

## 2020-05-24 DIAGNOSIS — F3341 Major depressive disorder, recurrent, in partial remission: Secondary | ICD-10-CM

## 2020-05-29 ENCOUNTER — Ambulatory Visit (INDEPENDENT_AMBULATORY_CARE_PROVIDER_SITE_OTHER): Payer: Medicare Other

## 2020-05-29 ENCOUNTER — Other Ambulatory Visit: Payer: Self-pay

## 2020-05-29 VITALS — BP 116/80 | HR 74 | Temp 98.1°F | Ht 64.0 in | Wt 158.2 lb

## 2020-05-29 DIAGNOSIS — E538 Deficiency of other specified B group vitamins: Secondary | ICD-10-CM

## 2020-05-29 MED ORDER — CYANOCOBALAMIN 1000 MCG/ML IJ SOLN
1000.0000 ug | Freq: Once | INTRAMUSCULAR | Status: AC
  Administered 2020-05-29: 1000 ug via INTRAMUSCULAR

## 2020-06-04 ENCOUNTER — Telehealth: Payer: Self-pay | Admitting: *Deleted

## 2020-06-04 NOTE — Chronic Care Management (AMB) (Signed)
  Chronic Care Management   Note  06/04/2020 Name: [name] MRN: [id number] DOB: Jul 02, 1940  [name] is a 80 y.o. year old female who is a primary care patient of [name], [place]. I reached out to [name] by phone today in response to a referral sent by Ms. Megan Salon Kracke's PCP, [name], FNP     Ms. Anzalone was given information about Chronic Care Management services today including:  1. CCM service includes personalized support from designated clinical staff supervised by her physician, including individualized plan of care and coordination with other care providers 2. 24/7 contact phone numbers for assistance for urgent and routine care needs. 3. Service will only be billed when office clinical staff spend 20 minutes or more in a month to coordinate care. 4. Only one practitioner may furnish and bill the service in a calendar month. 5. The patient may stop CCM services at any time (effective at the end of the month) by phone call to the office staff. 6. The patient will be responsible for cost sharing (co-pay) of up to 20% of the service fee (after annual deductible is met).  Patient agreed to services and verbal consent obtained.   Follow up plan: Telephone appointment with care management team member scheduled for:   BSW 06/18/2020 [facility] 07/06/2020  [facility]

## 2020-06-04 NOTE — Chronic Care Management (AMB) (Signed)
  Chronic Care Management   Outreach Note  06/04/2020 Name: [name] MRN: [id number] DOB: 12-16-1940  [name] is a 80 y.o. year old female who is a primary care patient of Arnette Felts, FNP. I reached out to [name] by phone today in response to a referral sent by Ms. Salley Scarlet Amparan's PCP, Arnette Felts, FNP.      An unsuccessful telephone outreach was attempted today. The patient was referred to the case management team for assistance with care management and care coordination.   Follow Up Plan: A HIPAA compliant phone message was left for the patient providing contact information and requesting a return call. The care management team will reach out to the patient again over the next 7 days. If patient returns call to provider office, please advise to call Embedded Care Management Care Guide Gwenevere Ghazi at [phone].  Gwenevere Ghazi  Care Guide, Embedded Care Coordination [facility] Management

## 2020-06-05 ENCOUNTER — Encounter: Payer: Self-pay | Admitting: Nurse Practitioner

## 2020-06-05 ENCOUNTER — Other Ambulatory Visit: Payer: Self-pay

## 2020-06-05 ENCOUNTER — Ambulatory Visit (INDEPENDENT_AMBULATORY_CARE_PROVIDER_SITE_OTHER): Payer: Medicare Other | Admitting: Nurse Practitioner

## 2020-06-05 VITALS — BP 118/72 | HR 65 | Temp 98.2°F | Ht 64.0 in | Wt 160.2 lb

## 2020-06-05 DIAGNOSIS — E538 Deficiency of other specified B group vitamins: Secondary | ICD-10-CM

## 2020-06-05 DIAGNOSIS — Z23 Encounter for immunization: Secondary | ICD-10-CM

## 2020-06-05 DIAGNOSIS — F3341 Major depressive disorder, recurrent, in partial remission: Secondary | ICD-10-CM

## 2020-06-05 MED ORDER — CYANOCOBALAMIN 1000 MCG/ML IJ SOLN: 1000.0000 ug | Freq: Once | INTRAMUSCULAR | Status: DC

## 2020-06-05 NOTE — Progress Notes (Signed)
I,Jacquel Redditt,acting as a Neurosurgeon for Arnette Felts, FNP.,have documented all relevant documentation on the behalf of Arnette Felts, FNP,as directed by  Arnette Felts, FNP while in the presence of Arnette Felts, FNP. This visit occurred during the SARS-CoV-2 public health emergency.  Safety protocols were in place, including screening questions prior to the visit, additional usage of staff PPE, and extensive cleaning of exam room while observing appropriate contact time as indicated for disinfecting solutions.  Subjective:     Patient ID: [name] , female    DOB: [birth year] , 80 y.o.   MRN: [id number]   Chief Complaint  Patient presents with  . Depression    HPI  Patient presents today for a f/u on her depression. She feels like her mood has been better since the increase in her medication.  She has had 4 vitamin B12 injections. She feels she has been doing well overall until this morning she complains of sudden light headed feeling prior to coming to office, once she ate she felt better.    Depression        This is a chronic problem.  The current episode started more than 1 month ago.   The onset quality is gradual.   Associated symptoms include no decreased concentration and no fatigue.     The symptoms are aggravated by nothing.  Compliance with treatment is good.   Pertinent negatives include no chronic fatigue syndrome, no chronic pain and no anxiety.    Past Medical History:  Diagnosis Date  . Abnormality of gait 04/10/2015  . Cervical spondylosis without myelopathy 07/18/2013  . Common migraine with intractable migraine 04/10/2015  . DJD (degenerative joint disease)   . Fibromyalgia   . Hiatal hernia   . Migraine headache      Family History  Problem Relation Age of Onset  . CVA Mother   . Stroke Father   . Emphysema Father      Current Outpatient Medications:  .  acetaminophen (TYLENOL) 500 MG tablet, Take 1,000 mg by mouth every 6 (six) hours as needed., Disp: ,  Rfl:  .  Boswellia-Glucosamine-Vit D (OSTEO BI-FLEX ONE PER DAY PO), Take by mouth., Disp: , Rfl:  .  Calcium Carbonate-Vit D-Min (CALCIUM 1200 PO), Take 1 capsule by mouth daily., Disp: , Rfl:  .  cetirizine (ZYRTEC) 10 MG tablet, Take 10 mg by mouth daily., Disp: , Rfl:  .  Cholecalciferol (VITAMIN D) 2000 units tablet, Take 2,000 Units by mouth daily., Disp: , Rfl:  .  clopidogrel (PLAVIX) 75 MG tablet, TAKE 1 TABLET BY MOUTH EVERY DAY, Disp: 90 tablet, Rfl: 1 .  fluticasone (FLONASE) 50 MCG/ACT nasal spray, Place into both nostrils daily as needed for allergies or rhinitis., Disp: , Rfl:  .  Omega-3 Fatty Acids (FISH OIL PO), Take 1 capsule by mouth daily. Takes it sometimes, Disp: , Rfl:  .  Polyethyl Glycol-Propyl Glycol (SYSTANE OP), Apply to eye. As needed, Disp: , Rfl:  .  pregabalin (LYRICA) 25 MG capsule, Take 1 capsule 3 times daily, Disp: 270 capsule, Rfl: 1 .  sertraline (ZOLOFT) 50 MG tablet, TAKE 1 TABLET BY MOUTH EVERY DAY, Disp: 90 tablet, Rfl: 1 .  topiramate (TOPAMAX) 25 MG tablet, Take one tablet in the morning and 3 in the evening, Disp: 360 tablet, Rfl: 3 .  vitamin C (ASCORBIC ACID) 250 MG tablet, Take 250 mg by mouth daily. , Disp: , Rfl:   Current Facility-Administered Medications:  .  cyanocobalamin ((VITAMIN  B-12)) injection 1,000 mcg, 1,000 mcg, Intramuscular, Once, Arnette Felts, FNP   Allergies  Allergen Reactions  . Aspirin Anaphylaxis and Hives  . Elavil [Amitriptyline] Anaphylaxis  . Latex   . Relafen [Nabumetone] Hives     Review of Systems  Constitutional: Negative.  Negative for fatigue.  HENT: Negative.   Eyes: Negative.   Respiratory: Negative.   Cardiovascular: Negative.   Gastrointestinal: Negative.   Endocrine: Negative.   Genitourinary: Negative.   Musculoskeletal: Negative.   Skin: Negative.   Allergic/Immunologic: Negative.   Neurological: Negative.   Hematological: Negative.   Psychiatric/Behavioral: Positive for depression.  Negative for decreased concentration.     Today's Vitals   06/05/20 1119  BP: 118/72  Pulse: 65  Temp: 98.2 F (36.8 C)  Weight: 160 lb 3.2 oz (72.7 kg)  Height: 5\' 4"  (1.626 m)   Body mass index is 27.5 kg/m.  Wt Readings from Last 3 Encounters:  06/05/20 160 lb 3.2 oz (72.7 kg)  05/29/20 158 lb 3.2 oz (71.8 kg)  05/22/20 158 lb 6.4 oz (71.8 kg)   Objective:  Physical Exam Vitals reviewed.  Pulmonary:     Effort: Pulmonary effort is normal. No respiratory distress.     Breath sounds: Normal breath sounds.  Neurological:     General: No focal deficit present.     Mental Status: She is oriented to person, place, and time.     Cranial Nerves: No cranial nerve deficit.  Psychiatric:        Mood and Affect: Mood normal.        Behavior: Behavior normal.        Thought Content: Thought content normal.        Judgment: Judgment normal.         Assessment And Plan:     1. Recurrent major depressive disorder, in partial remission (HCC)  She is feeling better with the increase in her medication  She is also had vitamin B12 injections for the last 4 weeks  2. B12 deficiency  Will check her vitamin B12 in one week she received her vitamin B12 injection before labs - cyanocobalamin ((VITAMIN B-12)) injection 1,000 mcg - Vitamin B12; Future  3. Need for influenza vaccination  Influenza vaccine administered  Encouraged to take Tylenol as needed for fever or muscle aches. - Flu Vaccine QUAD High Dose(Fluad)     Patient was given opportunity to ask questions. Patient verbalized understanding of the plan and was able to repeat key elements of the plan. All questions were answered to their satisfaction.  05/24/20, FNP   I, Arnette Felts, FNP, have reviewed all documentation for this visit. The documentation on 06/05/20 for the exam, diagnosis, procedures, and orders are all accurate and complete.   IF YOU HAVE BEEN REFERRED TO A SPECIALIST, IT MAY TAKE 1-2 WEEKS TO  SCHEDULE/PROCESS THE REFERRAL. IF YOU HAVE NOT HEARD FROM US/SPECIALIST IN TWO WEEKS, PLEASE GIVE 06/07/20 A CALL AT [phone].   THE PATIENT IS ENCOURAGED TO PRACTICE SOCIAL DISTANCING DUE TO THE COVID-19 PANDEMIC.

## 2020-06-18 ENCOUNTER — Ambulatory Visit (INDEPENDENT_AMBULATORY_CARE_PROVIDER_SITE_OTHER): Payer: Medicare Other

## 2020-06-18 ENCOUNTER — Ambulatory Visit: Payer: Self-pay

## 2020-06-18 ENCOUNTER — Telehealth: Payer: Medicare Other

## 2020-06-18 DIAGNOSIS — I2510 Atherosclerotic heart disease of native coronary artery without angina pectoris: Secondary | ICD-10-CM | POA: Diagnosis not present

## 2020-06-18 DIAGNOSIS — F3341 Major depressive disorder, recurrent, in partial remission: Secondary | ICD-10-CM

## 2020-06-18 DIAGNOSIS — E538 Deficiency of other specified B group vitamins: Secondary | ICD-10-CM

## 2020-06-18 DIAGNOSIS — E559 Vitamin D deficiency, unspecified: Secondary | ICD-10-CM

## 2020-06-18 NOTE — Patient Instructions (Signed)
Social Worker Visit Information  Goals we discussed today:  Goals Addressed            This Visit's Progress   . Barriers to Treatment Identified and Managed       Timeframe:  Long-Range Goal Priority:  Medium Start Date:    4.11.22                         Expected End Date:   8.9.22                    Next planned outreach: 5.11.22  Patient Goals/Self-Care Activities . Over the next 30 days, patient will:   - Patient will self administer medications as prescribed Patient will attend all scheduled provider appointments Review mailed resources prior to next scheduled call        Materials provided: Yes: Provided both verbal education and written materials on community resources  Ms. Arpino was given information about Chronic Care Management services today including:  1. CCM service includes personalized support from designated clinical staff supervised by her physician, including individualized plan of care and coordination with other care providers 2. 24/7 contact phone numbers for assistance for urgent and routine care needs. 3. Service will only be billed when office clinical staff spend 20 minutes or more in a month to coordinate care. 4. Only one practitioner may furnish and bill the service in a calendar month. 5. The patient may stop CCM services at any time (effective at the end of the month) by phone call to the office staff. 6. The patient will be responsible for cost sharing (co-pay) of up to 20% of the service fee (after annual deductible is met).  Patient agreed to services and verbal consent obtained.   The patient verbalized understanding of instructions, educational materials, and care plan provided today and declined offer to receive copy of patient instructions, educational materials, and care plan.   Follow up plan: SW will follow up with patient by phone over the next month.   Daneen Schick, BSW, CDP Social Worker, Certified Dementia Practitioner [place]  / [place] [facility] [phone]

## 2020-06-18 NOTE — Chronic Care Management (AMB) (Signed)
Chronic Care Management    Social Work Note  06/18/2020 Name: [name] MRN: [id number] DOB: December 31, 1940  [name] is a 80 y.o. year old female who is a primary care patient of [name], [place]. The CCM team was consulted to assist the patient with chronic disease management and/or care coordination needs related to: [employer] .   Engaged with patient by telephone for initial visit in response to provider referral for social work chronic care management and care coordination services.   Consent to Services:  The patient was given the following information about Chronic Care Management services today, agreed to services, and gave verbal consent: 1. CCM service includes personalized support from designated clinical staff supervised by the primary care provider, including individualized plan of care and coordination with other care providers 2. 24/7 contact phone numbers for assistance for urgent and routine care needs. 3. Service will only be billed when office clinical staff spend 20 minutes or more in a month to coordinate care. 4. Only one practitioner may furnish and bill the service in a calendar month. 5.The patient may stop CCM services at any time (effective at the end of the month) by phone call to the office staff. 6. The patient will be responsible for cost sharing (co-pay) of up to 20% of the service fee (after annual deductible is met). Patient agreed to services and consent obtained.  Patient agreed to services and consent obtained.   Assessment: Review of patient past medical history, allergies, medications, and health status, including review of relevant consultants reports was performed today as part of a comprehensive evaluation and provision of chronic care management and care coordination services.     SDOH (Social Determinants of Health) assessments and interventions performed:  SDOH Interventions   Flowsheet Row Most Recent Value  SDOH Interventions    Food Insecurity Interventions Other (Comment)  [sent info on out of the garden project]  Housing Interventions Other (Comment)  [Mailed information on LIEAP]  Transportation Interventions Intervention Not Indicated       Advanced Directives Status: Patient reports her son is her POA. Encouraged the patient to provide a copy of her Advance Directives to the practice  CCM Care Plan  Allergies  Allergen Reactions  . Aspirin Anaphylaxis and Hives  . Elavil [Amitriptyline] Anaphylaxis  . Latex   . Relafen [Nabumetone] Hives    Outpatient Encounter Medications as of 06/18/2020  Medication Sig  . acetaminophen (TYLENOL) 500 MG tablet Take 1,000 mg by mouth every 6 (six) hours as needed.  . [place]well[place]-Glucosamine-Vit D (OSTEO BI-FLEX ONE PER DAY PO) Take by mouth.  . Calcium Carbonate-Vit D-Min (CALCIUM 1200 PO) Take 1 capsule by mouth daily.  . cetirizine (ZYRTEC) 10 MG tablet Take 10 mg by mouth daily.  . Cholecalciferol (VITAMIN D) 2000 units tablet Take 2,000 Units by mouth daily.  . clopidogrel (PLAVIX) 75 MG tablet TAKE 1 TABLET BY MOUTH EVERY DAY  . fluticasone (FLONASE) 50 MCG/ACT nasal spray Place into both nostrils daily as needed for allergies or rhinitis.  . Omega-3 Fatty Acids (FISH OIL PO) Take 1 capsule by mouth daily. Takes it sometimes  . Polyethyl Glycol-Propyl Glycol (SYSTANE OP) Apply to eye. As needed  . pregabalin (LYRICA) 25 MG capsule Take 1 capsule 3 times daily  . sertraline (ZOLOFT) 50 MG tablet TAKE 1 TABLET BY MOUTH EVERY DAY  . topiramate (TOPAMAX) 25 MG tablet Take one tablet in the morning and 3 in the evening  . vitamin  C (ASCORBIC ACID) 250 MG tablet Take 250 mg by mouth daily.    Facility-Administered Encounter Medications as of 06/18/2020  Medication  . cyanocobalamin ((VITAMIN B-12)) injection 1,000 mcg    Patient Active Problem List   Diagnosis Date Noted  . Vitreomacular adhesion of right eye 03/08/2020  . Posterior vitreous detachment of left  eye 01/19/2020  . Exudative age-related macular degeneration of right eye with active choroidal neovascularization ([place]) 10/26/2019  . 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 Intermediate stage nonexudative age-related macular degeneration of both eyes 10/26/2019  . Vitamin D deficiency 06/28/2019  . Abnormal glucose 10/21/2018  . History of TIA (transient ischemic attack) 06/17/2018  . Recurrent major depressive disorder, in partial remission ([place]) 06/17/2018  . Seasonal allergies 06/17/2018  . Depression 02/11/2018  . Hiatal hernia 02/11/2018  . Dysphagia 02/11/2018  . Hyperkalemia 02/11/2018  . Abnormality of gait 04/10/2015  . Cervical spondylosis without myelopathy 07/18/2013  . Left-sided weakness 05/29/2013  . Left sided numbness 05/29/2013  . Headache 05/29/2013  . TIA (transient ischemic attack) 05/29/2013  . Neck pain on left side 05/29/2013  . Fibromyalgia     Conditions to be addressed/monitored: Recurrent Major Depression, Atherosclerosis Cardiovascular Disease, Vitamin D Deficiency; Financial constraints related to costs of living  Care Plan : Social Work [place]  Updates made by Daneen Schick since 06/18/2020 12:00 AM    Problem: Barriers to Treatment     Long-Range Goal: Barriers to Treatment Identified and Managed   Start Date: 06/18/2020  Expected End Date: 10/16/2020  Priority: Medium  Note:   Current Barriers:  . Chronic disease management support and education needs related to  Recurrent Major Depression, Atherosclerosis Cardiovascular Disease, Vitamin D Deficiency   . Financial constraints related to costs of living  Education officer, museum Clinical Goal(s):  Marland Kitchen Over the next 120 days, patient will work with SW to identify and address any acute and/or chronic care coordination needs related to the self health management of  Recurrent Major Depression, Atherosclerosis Cardiovascular Disease, Vitamin D Deficiency   . Over the next 90 days  the patient will work with SW to become more knowledgeable of community resources  CCM SW Interventions:  . Inter-disciplinary care team collaboration (see longitudinal plan of care) . Collaboration with [name], FNP regarding development and update of comprehensive plan of care as evidenced by provider attestation and co-signature . Successful outbound call placed to the patient to assess for [facility] needs . Determined the patient has difficulty paying to heat her home - the patient denies being behind on bills at this time . Provided verbal and written education about LIEAP (low Income Energy Assistance Program) . Discussed the patient sometimes worries she may run out of food - when asked if the patient has ever ran out of food the patient stated "I always find a way" . Provided verbal and written information on Out of the [employer] . Advised the patient SW would contact her over the next month to confirm receipt of mailed resources . Discussed the patient was referred to care management for depression . Determined the patient recently lost a past foster child within the past few months who was 79 years old. The patient also reports her husband passed away in 05-10-2011 and her mother passed away in 2012-05-09 . Assessed for patients interest in resources for grief support - the patient declined stating "I have done that before and I know what to do" . Discussed  follow up with SW while patient engages with  RN Case Manager  to address care management needs . Collaboration with RN Care Manager regarding interventions and plan . Scheduled follow up call over the next 30 days  Patient Goals/Self-Care Activities . Over the next 30 days, patient will:   - Patient will self administer medications as prescribed Patient will attend all scheduled provider appointments Review mailed resources prior to next scheduled call  Follow Up Plan:  SW will follow up with the patient over the next month        Follow Up Plan: SW will follow up with patient by phone over the next month      Daneen Schick, BSW, CDP Social Worker, Certified Dementia Practitioner [place] [facility] [place] [facility] [phone]  Total time spent performing care coordination and/or care management activities with the patient by phone or face to face = 54 minutes.

## 2020-06-18 NOTE — Chronic Care Management (AMB) (Signed)
Chronic Care Management   CCM RN Visit Note  06/18/2020 Name: [name] MRN: [id number] DOB: 1940/06/27  Subjective: [name] is a 80 y.o. year old female who is a primary care patient of [name], [place]. The care management team was consulted for assistance with disease management and care coordination needs.    Collaboration with Daneen Schick BSW  for Case Collaboration  in response to provider referral for case management and/or care coordination services.   Consent to Services:  The patient was given the following information about Chronic Care Management services today, agreed to services, and gave verbal consent: 1. CCM service includes personalized support from designated clinical staff supervised by the primary care provider, including individualized plan of care and coordination with other care providers 2. 24/7 contact phone numbers for assistance for urgent and routine care needs. 3. Service will only be billed when office clinical staff spend 20 minutes or more in a month to coordinate care. 4. Only one practitioner may furnish and bill the service in a calendar month. 5.The patient may stop CCM services at any time (effective at the end of the month) by phone call to the office staff. 6. The patient will be responsible for cost sharing (co-pay) of up to 20% of the service fee (after annual deductible is met). Patient agreed to services and consent obtained.  Patient agreed to services and verbal consent obtained.   Assessment: Review of patient past medical history, allergies, medications, health status, including review of consultants reports, laboratory and other test data, was performed as part of comprehensive evaluation and provision of chronic care management services.   SDOH (Social Determinants of Health) assessments and interventions performed:    CCM Care Plan  Allergies  Allergen Reactions  . Aspirin Anaphylaxis and Hives  . Elavil [Amitriptyline]  Anaphylaxis  . Latex   . Relafen [Nabumetone] Hives    Outpatient Encounter Medications as of 06/18/2020  Medication Sig  . acetaminophen (TYLENOL) 500 MG tablet Take 1,000 mg by mouth every 6 (six) hours as needed.  . Boswellia-Glucosamine-Vit D (OSTEO BI-FLEX ONE PER DAY PO) Take by mouth.  . Calcium Carbonate-Vit D-Min (CALCIUM 1200 PO) Take 1 capsule by mouth daily.  . cetirizine (ZYRTEC) 10 MG tablet Take 10 mg by mouth daily.  . Cholecalciferol (VITAMIN D) 2000 units tablet Take 2,000 Units by mouth daily.  . clopidogrel (PLAVIX) 75 MG tablet TAKE 1 TABLET BY MOUTH EVERY DAY  . fluticasone (FLONASE) 50 MCG/ACT nasal spray Place into both nostrils daily as needed for allergies or rhinitis.  . Omega-3 Fatty Acids (FISH OIL PO) Take 1 capsule by mouth daily. Takes it sometimes  . Polyethyl Glycol-Propyl Glycol (SYSTANE OP) Apply to eye. As needed  . pregabalin (LYRICA) 25 MG capsule Take 1 capsule 3 times daily  . sertraline (ZOLOFT) 50 MG tablet TAKE 1 TABLET BY MOUTH EVERY DAY  . topiramate (TOPAMAX) 25 MG tablet Take one tablet in the morning and 3 in the evening  . vitamin C (ASCORBIC ACID) 250 MG tablet Take 250 mg by mouth daily.    Facility-Administered Encounter Medications as of 06/18/2020  Medication  . cyanocobalamin ((VITAMIN B-12)) injection 1,000 mcg    Patient Active Problem List   Diagnosis Date Noted  . Vitreomacular adhesion of right eye 03/08/2020  . Posterior vitreous detachment of left eye 01/19/2020  . Exudative age-related macular degeneration of right eye with active choroidal neovascularization ([place]) 10/26/2019  . Macular pigment epithelial detachment of  right eye 10/26/2019  . Intermediate stage nonexudative age-related macular degeneration of both eyes 10/26/2019  . Vitamin D deficiency 06/28/2019  . Abnormal glucose 10/21/2018  . History of TIA (transient ischemic attack) 06/17/2018  . Recurrent major depressive disorder, in partial remission ([employer])  06/17/2018  . Seasonal allergies 06/17/2018  . Depression 02/11/2018  . Hiatal hernia 02/11/2018  . Dysphagia 02/11/2018  . Hyperkalemia 02/11/2018  . Abnormality of gait 04/10/2015  . Cervical spondylosis without myelopathy 07/18/2013  . Left-sided weakness 05/29/2013  . Left sided numbness 05/29/2013  . Headache 05/29/2013  . TIA (transient ischemic attack) 05/29/2013  . Neck pain on left side 05/29/2013  . Fibromyalgia     Conditions to be addressed/monitored:Recurrent major depression, Atherosclerotic cardiovascular disease, Vitamin D deficiency   Care Plan : Assist with Chronic Care Management and Care Coordination needs  Updates made by Lynne Logan, RN since 06/18/2020 12:00 AM    Problem: Assist with Chronic Care Management and Care Coordination needs   Priority: High    Goal: Assist with Chronic Care Management and Care Coordination needs   Start Date: 06/18/2020  Expected End Date: 08/17/2020  This Visit's Progress: On track  Priority: High  Note:   Current Barriers:  Marland Kitchen Knowledge Barriers related to resources and support available to address needs related to Recurrent major depression, Atherosclerotic cardiovascular disease, Vitamin D deficiency.   Case Manager Clinical Goal(s):  Marland Kitchen Over the next 60 days, patient will work with the CCM team to address needs related to Chronic disease management and Care Coordination needs for Recurrent major depression, Atherosclerotic cardiovascular disease, Vitamin D deficiency.   Interventions:  . Collaborated with  embedded BSW Daneen Schick  to establish an individualized plan of care   Patient Self Care Activities:  . Patient will work with the CCM team to address care coordination needs and will continue to work with the clinical team to address health care and disease management related needs.    Initial RN CM Call scheduled for 07/06/20 '@3' :30 PM      Plan:Telephone follow up appointment with care management team member  scheduled for:  07/06/20  Barb Merino, RN, BSN, CCM Care Management Coordinator [place] [facility]  Direct Phone: [phone]

## 2020-06-19 ENCOUNTER — Other Ambulatory Visit: Payer: Medicare Other

## 2020-06-21 ENCOUNTER — Ambulatory Visit (INDEPENDENT_AMBULATORY_CARE_PROVIDER_SITE_OTHER): Payer: Medicare Other | Admitting: Ophthalmology

## 2020-06-21 ENCOUNTER — Other Ambulatory Visit: Payer: Self-pay

## 2020-06-21 ENCOUNTER — Encounter (INDEPENDENT_AMBULATORY_CARE_PROVIDER_SITE_OTHER): Payer: Self-pay | Admitting: Ophthalmology

## 2020-06-21 ENCOUNTER — Encounter (INDEPENDENT_AMBULATORY_CARE_PROVIDER_SITE_OTHER): Payer: Medicare Other | Admitting: Ophthalmology

## 2020-06-21 DIAGNOSIS — H353211 Exudative age-related macular degeneration, right eye, with active choroidal neovascularization: Secondary | ICD-10-CM | POA: Diagnosis not present

## 2020-06-21 DIAGNOSIS — H353132 Nonexudative age-related macular degeneration, bilateral, intermediate dry stage: Secondary | ICD-10-CM

## 2020-06-21 DIAGNOSIS — H43812 Vitreous degeneration, left eye: Secondary | ICD-10-CM

## 2020-06-21 MED ORDER — BEVACIZUMAB 2.5 MG/0.1ML IZ SOSY
2.5000 mg | PREFILLED_SYRINGE | INTRAVITREAL | Status: AC | PRN
  Administered 2020-06-21: 2.5 mg via INTRAVITREAL

## 2020-06-21 NOTE — Assessment & Plan Note (Signed)
No signs of CNVM development left eye today

## 2020-06-21 NOTE — Assessment & Plan Note (Signed)
Much less subretinal fluid in subfoveal location as compared to onset noted August 2021.  On intravitreal Avastin today at 8-week follow-up we will repeat injection today and examination again in 8 weeks

## 2020-06-21 NOTE — Assessment & Plan Note (Signed)

## 2020-06-21 NOTE — Progress Notes (Signed)
06/21/2020     CHIEF COMPLAINT Patient presents for Retina Follow Up (8 Wk F/U OU, poss Avastin OD//Pt denies noticeable changes to [place] OU since last visit. Pt denies ocular pain, flashes of light, or floaters OU. //)   HISTORY OF PRESENT ILLNESS: [name] is a 80 y.o. female who presents to the clinic today for:   HPI    Retina Follow Up    Patient presents with  Wet AMD.  In right eye.  This started 8 weeks ago.  Severity is mild.  Duration of 8 weeks.  Since onset it is stable. Additional comments: 8 Wk F/U OU, poss Avastin OD  Pt denies noticeable changes to [place] OU since last visit. Pt denies ocular pain, flashes of light, or floaters OU.          Last edited by Ileana Roup, COA on 06/21/2020  1:57 PM. (History)      Referring physician: Arnette Felts, FNP [address] [place],  [place] [address]  HISTORICAL INFORMATION:   Selected notes from the MEDICAL RECORD NUMBER    Lab Results  Component Value Date   HGBA1C 5.5 10/27/2019     CURRENT MEDICATIONS: Current Outpatient Medications (Ophthalmic Drugs)  Medication Sig  . Polyethyl Glycol-Propyl Glycol (SYSTANE OP) Apply to eye. As needed   No current facility-administered medications for this visit. (Ophthalmic Drugs)   Current Outpatient Medications (Other)  Medication Sig  . acetaminophen (TYLENOL) 500 MG tablet Take 1,000 mg by mouth every 6 (six) hours as needed.  . Boswellia-Glucosamine-Vit D (OSTEO BI-FLEX ONE PER DAY PO) Take by mouth.  . Calcium Carbonate-Vit D-Min (CALCIUM 1200 PO) Take 1 capsule by mouth daily.  . cetirizine (ZYRTEC) 10 MG tablet Take 10 mg by mouth daily.  . Cholecalciferol (VITAMIN D) 2000 units tablet Take 2,000 Units by mouth daily.  . clopidogrel (PLAVIX) 75 MG tablet TAKE 1 TABLET BY MOUTH EVERY DAY  . fluticasone (FLONASE) 50 MCG/ACT nasal spray Place into both nostrils daily as needed for allergies or rhinitis.  . Omega-3 Fatty Acids (FISH OIL PO) Take 1  capsule by mouth daily. Takes it sometimes  . pregabalin (LYRICA) 25 MG capsule Take 1 capsule 3 times daily  . sertraline (ZOLOFT) 50 MG tablet TAKE 1 TABLET BY MOUTH EVERY DAY  . topiramate (TOPAMAX) 25 MG tablet Take one tablet in the morning and 3 in the evening  . vitamin C (ASCORBIC ACID) 250 MG tablet Take 250 mg by mouth daily.    Current Facility-Administered Medications (Other)  Medication Route  . cyanocobalamin ((VITAMIN B-12)) injection 1,000 mcg Intramuscular      REVIEW OF SYSTEMS:    ALLERGIES Allergies  Allergen Reactions  . Aspirin Anaphylaxis and Hives  . Elavil [Amitriptyline] Anaphylaxis  . Latex   . Relafen [Nabumetone] Hives    PAST MEDICAL HISTORY Past Medical History:  Diagnosis Date  . Abnormality of gait 04/10/2015  . Cervical spondylosis without myelopathy 07/18/2013  . Common migraine with intractable migraine 04/10/2015  . DJD (degenerative joint disease)   . Fibromyalgia   . Hiatal hernia   . Migraine headache   . Vitreomacular adhesion of right eye 03/08/2020   Past Surgical History:  Procedure Laterality Date  . ABDOMINAL HYSTERECTOMY    . DILATION AND CURETTAGE OF UTERUS    . HIATAL HERNIA REPAIR    . TONSILLECTOMY      FAMILY HISTORY Family History  Problem Relation Age of Onset  . CVA  Mother   . Stroke Father   . Emphysema Father     SOCIAL HISTORY Social History   Tobacco Use  . Smoking status: Never Smoker  . Smokeless tobacco: Never Used  Vaping Use  . Vaping Use: Never used  Substance Use Topics  . Alcohol use: No    Comment: very occasional  . Drug use: No         OPHTHALMIC EXAM:  Base Eye Exam    Visual Acuity (ETDRS)      Right Left   Dist [place] 20/30 -1 20/40 +2   Dist ph [place] 20/30 +1 20/25 +1       Tonometry (Tonopen, 2:03 PM)      Right Left   Pressure 12 11       Pupils      Pupils Dark Light Shape React APD   Right PERRL 5 5 Round Minimal None   Left PERRL 5 5 Round Minimal None        Visual Fields (Counting fingers)      Left Right    Full Full       Extraocular Movement      Right Left    Full Full       Neuro/Psych    Oriented x3: Yes   Mood/Affect: Normal       Dilation    Both eyes: 1.0% Mydriacyl, 2.5% Phenylephrine @ 2:03 PM        Slit Lamp and Fundus Exam    External Exam      Right Left   External Normal Normal       Slit Lamp Exam      Right Left   Lids/Lashes Normal Normal   Conjunctiva/Sclera White and quiet White and quiet   Cornea Clear Clear   Anterior Chamber Deep and quiet Deep and quiet   Iris Round and reactive Round and reactive   Lens 2+ Nuclear sclerosis 2+ Nuclear sclerosis   Anterior Vitreous Normal Normal       Fundus Exam      Right Left   Posterior Vitreous Central vitreous floaters Central vitreous floaters   Disc Normal Normal   C/D Ratio 0.2 0.1   Macula less Macular thickening, Smaller Subretinal neovascular membrane, Retinal pigment epithelial mottling and hyperpigmentation subfoveal, Early age related macular degeneration Early age related macular degeneration, no macular thickening, no exudates   Vessels Normal Normal   Periphery Normal Normal          IMAGING AND PROCEDURES  Imaging and Procedures for 06/21/20  OCT, Retina - OU - Both Eyes       Right Eye Quality was good. Scan locations included subfoveal. Central Foveal Thickness: 267. Progression has improved. Findings include vitreous traction, no IRF, abnormal foveal contour, pigment epithelial detachment.   Left Eye Quality was good. Scan locations included subfoveal. Central Foveal Thickness: 262. Progression has been stable. Findings include retinal drusen .   Notes Small residual cyst serous subretinal detachment temporal to the fovea much improved though compared to August, OD vastly improved overall.  Apparent posterior vitreous detachment now  OS with incidental posterior vitreous detachment       Intravitreal Injection,  Pharmacologic Agent - OD - Right Eye       Time Out 06/21/2020. 3:03 PM. Confirmed correct patient, procedure, site, and patient consented.   Anesthesia Topical anesthesia was used. Anesthetic medications included Akten 3.5%.   Procedure Preparation included Ofloxacin , Tobramycin 0.3%, 10% betadine to  eyelids, 5% betadine to ocular surface. A supplied needle was used.   Injection:  2.5 mg Bevacizumab (AVASTIN) 2.5mg /0.32mL SOSY   NDC: 50539-767-34, Lot: [id number]   Route: Intravitreal, Site: Right Eye  Post-op Post injection exam found visual acuity of at least counting fingers. The patient tolerated the procedure well. There were no complications. The patient received written and verbal post procedure care education. Post injection medications were not given.                 ASSESSMENT/PLAN:  Exudative age-related macular degeneration of right eye with active choroidal neovascularization (HCC) Much less subretinal fluid in subfoveal location as compared to onset noted August 2021.  On intravitreal Avastin today at 8-week follow-up we will repeat injection today and examination again in 8 weeks  Intermediate stage nonexudative age-related macular degeneration of both eyes No signs of CNVM development left eye today  Posterior vitreous detachment of left eye   The nature of posterior vitreous detachment was discussed with the patient as well as its physiology, its age prevalence, and its possible implication regarding retinal breaks and detachment.  An informational brochure was given to the patient.  All the patient's questions were answered.  The patient was asked to return if new or different flashes or floaters develops.   Patient was instructed to contact office immediately if any changes were noticed. I explained to the patient that vitreous inside the eye is similar to jello inside a bowl. As the jello melts it can start to pull away from the bowl, similarly the vitreous  throughout our lives can begin to pull away from the retina. That process is called a posterior vitreous detachment. In some cases, the vitreous can tug hard enough on the retina to form a retinal tear. I discussed with the patient the signs and symptoms of a retinal detachment.  Do not rub the eye.      ICD-10-CM   1. Exudative age-related macular degeneration of right eye with active choroidal neovascularization (HCC)  H35.3211 OCT, Retina - OU - Both Eyes    Intravitreal Injection, Pharmacologic Agent - OD - Right Eye    bevacizumab (AVASTIN) SOSY 2.5 mg  2. Intermediate stage nonexudative age-related macular degeneration of both eyes  H35.3132   3. Posterior vitreous detachment of left eye  H43.812     1.  Improve macular findings OD overall since onset of therapy, August 2021.  Less subretinal fluid yet still active.  Early at 8-week follow-up.  Repeat injection Avastin today  2.  Incidentally VMA has resolved OD as well  3.  Ophthalmic Meds Ordered this visit:  Meds ordered this encounter  Medications  . bevacizumab (AVASTIN) SOSY 2.5 mg       Return in about 8 weeks (around 08/16/2020) for dilate, OD, AVASTIN OCT.  There are no Patient Instructions on file for this visit.   Explained the diagnoses, plan, and follow up with the patient and they expressed understanding.  Patient expressed understanding of the importance of proper follow up care.   Alford Highland Keaston Pile M.D. Diseases & Surgery of the Retina and Vitreous [facility] 06/21/20     Abbreviations: M myopia (nearsighted); A astigmatism; H hyperopia (farsighted); P presbyopia; Mrx spectacle prescription;  CTL contact lenses; OD right eye; OS left eye; OU both eyes  XT exotropia; ET esotropia; PEK punctate epithelial keratitis; PEE punctate epithelial erosions; DES dry eye syndrome; MGD meibomian gland dysfunction; ATs artificial tears; PFAT's preservative free  artificial tears; [place] nuclear sclerotic  cataract; PSC posterior subcapsular cataract; ERM epi-retinal membrane; PVD posterior vitreous detachment; RD retinal detachment; DM diabetes mellitus; DR diabetic retinopathy; NPDR non-proliferative diabetic retinopathy; PDR proliferative diabetic retinopathy; CSME clinically significant macular edema; DME diabetic macular edema; dbh dot blot hemorrhages; CWS cotton wool spot; POAG primary open angle glaucoma; C/D cup-to-disc ratio; HVF humphrey visual field; GVF goldmann visual field; OCT optical coherence tomography; IOP intraocular pressure; BRVO Branch retinal vein occlusion; CRVO central retinal vein occlusion; CRAO central retinal artery occlusion; BRAO branch retinal artery occlusion; RT retinal tear; SB scleral buckle; PPV pars plana vitrectomy; VH Vitreous hemorrhage; PRP panretinal laser photocoagulation; IVK intravitreal kenalog; VMT vitreomacular traction; MH Macular hole;  NVD neovascularization of the disc; NVE neovascularization elsewhere; AREDS age related eye disease study; ARMD age related macular degeneration; POAG primary open angle glaucoma; EBMD epithelial/anterior basement membrane dystrophy; ACIOL anterior chamber intraocular lens; IOL intraocular lens; PCIOL posterior chamber intraocular lens; Phaco/IOL phacoemulsification with intraocular lens placement; Sharp photorefractive keratectomy; LASIK laser assisted in situ keratomileusis; HTN hypertension; DM diabetes mellitus; COPD chronic obstructive pulmonary disease

## 2020-06-26 ENCOUNTER — Other Ambulatory Visit: Payer: Self-pay

## 2020-06-26 ENCOUNTER — Other Ambulatory Visit: Payer: Medicare Other

## 2020-06-26 DIAGNOSIS — E538 Deficiency of other specified B group vitamins: Secondary | ICD-10-CM | POA: Diagnosis not present

## 2020-06-27 LAB — VITAMIN B12: Vitamin B-12: 532 pg/mL (ref 232–1245)

## 2020-06-29 ENCOUNTER — Telehealth: Payer: Self-pay

## 2020-06-29 NOTE — Telephone Encounter (Signed)
Left a detailed message.

## 2020-06-29 NOTE — Telephone Encounter (Signed)
-----   Message from Arnette Felts, FNP sent at 06/27/2020  8:14 AM EDT ----- Your vitamin B12 levels are much better

## 2020-07-06 ENCOUNTER — Other Ambulatory Visit: Payer: Self-pay

## 2020-07-06 ENCOUNTER — Telehealth: Payer: Medicare Other

## 2020-07-06 ENCOUNTER — Ambulatory Visit (INDEPENDENT_AMBULATORY_CARE_PROVIDER_SITE_OTHER): Payer: Medicare Other

## 2020-07-06 ENCOUNTER — Telehealth: Payer: Self-pay

## 2020-07-06 VITALS — BP 112/74 | HR 69 | Temp 98.2°F | Ht 64.0 in | Wt 159.8 lb

## 2020-07-06 DIAGNOSIS — E538 Deficiency of other specified B group vitamins: Secondary | ICD-10-CM | POA: Diagnosis not present

## 2020-07-06 MED ORDER — CYANOCOBALAMIN 1000 MCG/ML IJ SOLN
1000.0000 ug | Freq: Once | INTRAMUSCULAR | Status: AC
  Administered 2020-07-06: 1000 ug via INTRAMUSCULAR

## 2020-07-06 NOTE — Telephone Encounter (Addendum)
  Chronic Care Management   Outreach Note  07/06/2020 Name: [name] MRN: [id number] DOB: February 20, 1941  Referred by: Arnette Felts, FNP Reason for referral : Chronic Care Management (Initial RN CM Call Attempt )   An unsuccessful telephone outreach was attempted today. The patient was referred to the case management team for assistance with care management and care coordination.   Follow Up Plan: A HIPAA compliant phone message was left for the patient providing contact information and requesting a return call. Telephone follow up appointment with care management team member scheduled for: 07/18/20  Delsa Sale, RN, BSN, CCM Care Management Coordinator [facility] Care Management/[facility]  Direct Phone: [phone]

## 2020-07-06 NOTE — Patient Instructions (Signed)
Vitamin B12 Deficiency Vitamin B12 deficiency occurs when the body does not have enough vitamin B12, which is an important vitamin. The body needs this vitamin:  To make red blood cells.  To make DNA. This is the genetic material inside cells.  To help the nerves work properly so they can carry messages from the brain to the body. Vitamin B12 deficiency can cause various health problems, such as a low red blood cell count (anemia) or nerve damage. What are the causes? This condition may be caused by:  Not eating enough foods that contain vitamin B12.  Not having enough stomach acid and digestive fluids to properly absorb vitamin B12 from the food that you eat.  Certain digestive system diseases that make it hard to absorb vitamin B12. These diseases include Crohn's disease, chronic pancreatitis, and cystic fibrosis.  A condition in which the body does not make enough of a protein (intrinsic factor), resulting in too few red blood cells (pernicious anemia).  Having a surgery in which part of the stomach or small intestine is removed.  Taking certain medicines that make it hard for the body to absorb vitamin B12. These medicines include: ? Heartburn medicines (antacids and proton pump inhibitors). ? Certain antibiotic medicines. ? Some medicines that are used to treat diabetes, tuberculosis, gout, or high cholesterol. What increases the risk? The following factors may make you more likely to develop a B12 deficiency:  Being older than age 50.  Eating a vegetarian or vegan diet, especially while you are pregnant.  Eating a poor diet while you are pregnant.  Taking certain medicines.  Having alcoholism. What are the signs or symptoms? In some cases, there are no symptoms of this condition. If the condition leads to anemia or nerve damage, various symptoms can occur, such as:  Weakness.  Fatigue.  Loss of appetite.  Weight loss.  Numbness or tingling in your hands and  feet.  Redness and burning of the tongue.  Confusion or memory problems.  Depression.  Sensory problems, such as color blindness, ringing in the ears, or loss of taste.  Diarrhea or constipation.  Trouble walking. If anemia is severe, symptoms can include:  Shortness of breath.  Dizziness.  Rapid heart rate (tachycardia). How is this diagnosed? This condition may be diagnosed with a blood test to measure the level of vitamin B12 in your blood. You may also have other tests, including:  A group of tests that measure certain characteristics of blood cells (complete blood count, CBC).  A blood test to measure intrinsic factor.  A procedure where a thin tube with a camera on the end is used to look into your stomach or intestines (endoscopy). Other tests may be needed to discover the cause of B12 deficiency. How is this treated? Treatment for this condition depends on the cause. This condition may be treated by:  Changing your eating and drinking habits, such as: ? Eating more foods that contain vitamin B12. ? Drinking less alcohol or no alcohol.  Getting vitamin B12 injections.  Taking vitamin B12 supplements. Your health care provider will tell you which dosage is best for you. Follow these instructions at home: Eating and drinking  Eat lots of healthy foods that contain vitamin B12, including: ? Meats and poultry. This includes beef, pork, chicken, turkey, and organ meats, such as liver. ? Seafood. This includes clams, rainbow trout, salmon, tuna, and haddock. ? Eggs. ? Cereal and dairy products that are fortified. This means that vitamin B12 has   been added to the food. Check the label on the package to see if the food is fortified. The items listed above may not be a complete list of recommended foods and beverages. Contact a dietitian for more information.   General instructions  Get any injections that are prescribed by your health care provider.  Take  supplements only as told by your health care provider. Follow the directions carefully.  Do not drink alcohol if your health care provider tells you not to. In some cases, you may only be asked to limit alcohol use.  Keep all follow-up visits as told by your health care provider. This is important. Contact a health care provider if:  Your symptoms come back. Get help right away if you:  Develop shortness of breath.  Have a rapid heart rate.  Have chest pain.  Become dizzy or lose consciousness. Summary  Vitamin B12 deficiency occurs when the body does not have enough vitamin B12.  The main causes of vitamin B12 deficiency include dietary deficiency, digestive diseases, pernicious anemia, and having a surgery in which part of the stomach or small intestine is removed.  In some cases, there are no symptoms of this condition. If the condition leads to anemia or nerve damage, various symptoms can occur, such as weakness, shortness of breath, and numbness.  Treatment may include getting vitamin B12 injections or taking vitamin B12 supplements. Eat lots of healthy foods that contain vitamin B12. This information is not intended to replace advice given to you by your health care provider. Make sure you discuss any questions you have with your health care provider. Document Revised: 08/13/2018 Document Reviewed: 11/03/2017 Elsevier Patient Education  2021 Elsevier Inc.  

## 2020-07-09 ENCOUNTER — Telehealth: Payer: Self-pay

## 2020-07-09 ENCOUNTER — Ambulatory Visit (INDEPENDENT_AMBULATORY_CARE_PROVIDER_SITE_OTHER): Payer: Medicare Other

## 2020-07-09 ENCOUNTER — Telehealth: Payer: Medicare Other

## 2020-07-09 DIAGNOSIS — Z8673 Personal history of transient ischemic attack (TIA), and cerebral infarction without residual deficits: Secondary | ICD-10-CM

## 2020-07-09 DIAGNOSIS — F3341 Major depressive disorder, recurrent, in partial remission: Secondary | ICD-10-CM | POA: Diagnosis not present

## 2020-07-09 DIAGNOSIS — E7800 Pure hypercholesterolemia, unspecified: Secondary | ICD-10-CM

## 2020-07-09 DIAGNOSIS — I2510 Atherosclerotic heart disease of native coronary artery without angina pectoris: Secondary | ICD-10-CM | POA: Diagnosis not present

## 2020-07-09 DIAGNOSIS — R413 Other amnesia: Secondary | ICD-10-CM

## 2020-07-09 DIAGNOSIS — E559 Vitamin D deficiency, unspecified: Secondary | ICD-10-CM

## 2020-07-09 DIAGNOSIS — W19XXXA Unspecified fall, initial encounter: Secondary | ICD-10-CM

## 2020-07-09 NOTE — Telephone Encounter (Signed)
  Chronic Care Management   Outreach Note  07/09/2020 Name: [name] MRN: [id number] DOB: [birth year]  Referred by: Arnette Felts, FNP Reason for referral : Chronic Care Management (#2 Initial RN CM Call Attempt )   A second unsuccessful telephone outreach was attempted today. The patient was referred to the case management team for assistance with care management and care coordination.   Follow Up Plan: A HIPAA compliant phone message was left for the patient providing contact information and requesting a return call. The care management team will reach out to the patient again over the next 30-45 days.   Delsa Sale, RN, BSN, CCM Care Management Coordinator [facility] Care Management/[facility]  Direct Phone: [phone]

## 2020-07-18 ENCOUNTER — Telehealth: Payer: Medicare Other

## 2020-07-18 ENCOUNTER — Telehealth: Payer: Self-pay

## 2020-07-18 NOTE — Telephone Encounter (Signed)
  Chronic Care Management   Outreach Note  07/18/2020 Name: [name] MRN: [id number] DOB: 1940/10/17  Referred by: Arnette Felts, FNP Reason for referral : Chronic Care Management   An unsuccessful telephone outreach was attempted today. The patient was referred to the case management team for assistance with care management and care coordination.   Follow Up Plan: A HIPAA compliant phone message was left for the patient providing contact information and requesting a return call.  The care management team will reach out to the patient again over the next 30 days.   Bevelyn Ngo, BSW, CDP Social Worker, Certified Dementia Practitioner [facility] Care Management [phone]

## 2020-07-23 NOTE — Patient Instructions (Signed)
Goals Addressed    . COMPLETED: Assist with Chronic Care Management and Care Coordination needs       Timeframe:  Short-Term Goal Priority:  High Start Date:  06/18/20                           Expected End Date:  08/17/20    Initial RN CM Call Scheduled for: 07/06/20 @3 :30 PM   Patient Self Care Activities:  . Patient will work with the CCM team to address care coordination needs and will continue to work with the clinical team to address health care and disease management related needs.                         . Prevent Falls and Injury   On track    Timeframe:  Short-Term Goal Priority:  High Start Date: 07/09/20                            Expected End Date: 01/09/21                    Follow Up Date: 08/23/20    - always wear low-heeled or flat shoes or slippers with nonskid soles - keep my cell phone with me always - learn how to get back up if I fall - make an emergency alert plan in case I fall - pick up clutter from the floors - use a nonslip pad with throw rugs, or remove them completely - use a cane or walker - use a nightlight in the bathroom    Why is this important?    Most falls happen when it is hard for you to walk safely. Your balance may be off because of an illness. You may have pain in your knees, hip or other joints.   You may be overly tired or taking medicines that make you sleepy. You may not be able to see or hear clearly.   Falls can lead to broken bones, bruises or other injuries.   There are things you can do to help prevent falling.     Notes:     . Track and Manage My Symptoms-Depression   On track    Timeframe:  Long-Range Goal Priority:  High Start Date: 07/09/20                            Expected End Date: 07/10/21                      Follow Up Date: 08/23/20     - avoid negative self-talk - develop a plan to deal with triggers like holidays, anniversaries - exercise at least 2 to 3 times per week - have a plan for how to handle bad days -  journal feelings and what helps to feel better or worse - spend time or talk with others at least 2 to 3 times per week - watch for early signs of feeling worse    Why is this important?    Keeping track of your progress will help your treatment team find the right mix of medicine and therapy for you.   Write in your journal every day.   Day-to-day changes in depression symptoms are normal. It may be more helpful to check your progress at the end  of each week instead of every day.     Notes:     Marland Kitchen Vitamin D deficiency - treatment optimized   On track    Timeframe:  Long-Range Goal Priority:  Medium Start Date:  07/09/20                           Expected End Date: 07/09/21  Next Scheduled Follow up date: 08/23/20    Self Care Activities:  . Continue to keep all scheduled follow up appointments . Take medications as directed  . Let your healthcare team know if you are unable to take your medications . Call your pharmacy for refills at least 7 days prior to running out of medication . Eat Vitamin D rich foods, try to get at least 15 minutes of sunlight daily when able  . Review patient educational materials related to Vitamin D deficiency and FAQ Patient Goals: - maintain and or improve Vitamin D deficiency                          Patient Care Plan: Assist with Chronic Care Management and Care Coordination needs  Completed 07/23/2020  Problem Identified: Assist with Chronic Care Management and Care Coordination needs Resolved 07/09/2020  Priority: High    Goal: Assist with Chronic Care Management and Care Coordination needs Completed 07/09/2020  Start Date: 06/18/2020  Expected End Date: 08/17/2020  Recent Progress: On track  Priority: High  Note:   Current Barriers:  Marland Kitchen Knowledge Barriers related to resources and support available to address needs related to Recurrent major depression, Atherosclerotic cardiovascular disease, Vitamin D deficiency.   Case Manager Clinical Goal(s):   Marland Kitchen Over the next 60 days, patient will work with the CCM team to address needs related to Chronic disease management and Care Coordination needs for Recurrent major depression, Atherosclerotic cardiovascular disease, Vitamin D deficiency.   Interventions:  . Collaborated with  embedded BSW Bevelyn Ngo  to establish an individualized plan of care   Patient Self Care Activities:  . Patient will work with the CCM team to address care coordination needs and will continue to work with the clinical team to address health care and disease management related needs.    Initial RN CM Call scheduled for 07/06/20 @3 :30 PM     Patient Care Plan: Social Work [facility] Care Plan    Problem Identified: Barriers to Treatment     Long-Range Goal: Barriers to Treatment Identified and Managed   Start Date: 06/18/2020  Expected End Date: 10/16/2020  Priority: Medium  Note:   Current Barriers:  . Chronic disease management support and education needs related to  Recurrent Major Depression, Atherosclerosis Cardiovascular Disease, Vitamin D Deficiency   . Financial constraints related to costs of living  12/16/2020 Clinical Goal(s):  Child psychotherapist Over the next 120 days, patient will work with SW to identify and address any acute and/or chronic care coordination needs related to the self health management of  Recurrent Major Depression, Atherosclerosis Cardiovascular Disease, Vitamin D Deficiency   . Over the next 90 days the patient will work with SW to become more knowledgeable of community resources  CCM SW Interventions:  . Inter-disciplinary care team collaboration (see longitudinal plan of care) . Collaboration with Marland Kitchen, FNP regarding development and update of comprehensive plan of care as evidenced by provider attestation and co-signature . Successful outbound call placed to the patient to assess for [facility]  needs . Determined the patient has difficulty paying to heat her home - the patient denies being  behind on bills at this time . Provided verbal and written education about LIEAP (low Income Energy Assistance Program) . Discussed the patient sometimes worries she may run out of food - when asked if the patient has ever ran out of food the patient stated "I always find a way" . Provided verbal and written information on Out of the [employer] . Advised the patient SW would contact her over the next month to confirm receipt of mailed resources . Discussed the patient was referred to care management for depression . Determined the patient recently lost a past foster child within the past few months who was 79 years old. The patient also reports her husband passed away in 07-23-11 and her mother passed away in 07/22/12 . Assessed for patients interest in resources for grief support - the patient declined stating "I have done that before and I know what to do" . Discussed follow up with SW while patient engages with  RN Case Manager  to address care management needs . Collaboration with RN Care Manager regarding interventions and plan . Scheduled follow up call over the next 30 days  Patient Goals/Self-Care Activities . Over the next 30 days, patient will:   - Patient will self administer medications as prescribed Patient will attend all scheduled provider appointments Review mailed resources prior to next scheduled call  Follow Up Plan:  SW will follow up with the patient over the next month    Patient Care Plan: Fall Risk (Adult)    Problem Identified: Fall Risk   Priority: High    Long-Range Goal: Absence of Fall and Fall-Related Injury   Start Date: 07/09/2020  Expected End Date: 01/09/2021  This Visit's Progress: On track  Priority: High  Note:   Current Barriers:  Marland Kitchen Knowledge Deficits related to fall precautions in patient with Recurrent major depression, Atherosclerotic cardiovascular disease, Vitamin D deficiency, Memory changes, History of TIA, Fall, initial encounter,  Elevated cholesterol   . Decreased adherence to prescribed treatment for fall prevention . Lacks social connections Clinical Goal(s):  . patient will demonstrate improved adherence to prescribed treatment plan for decreasing falls as evidenced by patient reporting and review of EMR . patient will verbalize using fall risk reduction strategies discussed . patient will not experience additional falls Interventions:  07/09/20 completed successful outbound call with patient  . Collaboration with Arnette Felts, FNP regarding development and update of comprehensive plan of care as evidenced by provider attestation and co-signature . Inter-disciplinary care team collaboration (see longitudinal plan of care) . Evaluation of current treatment plan related to Abnormal gait/falls and patient's adherence to plan as established by provider. . Provided written and verbal education re: Potential causes of falls and Fall prevention strategies . Reviewed medications and discussed potential side effects of medications such as dizziness and frequent urination . Assessed for s/s of orthostatic hypotension . Assessed for falls since last encounter. . Assessed patients knowledge of fall risk prevention secondary to previously provided education. . Assessed working status of life alert bracelet and patient adherence . Provided patient information for fall alert systems . Discussed plans with patient for ongoing care management follow up and provided patient with direct contact information for care management team Patient Self-Care Activities:  . Continue to keep all scheduled follow up appointments . Take medications as directed  . Let your healthcare team know if you are unable  to take your medications . Call your pharmacy for refills at least 7 days prior to running out of medication Patient Goals:  - always wear low-heeled or flat shoes or slippers with nonskid soles - keep my cell phone with me always - learn  how to get back up if I fall - make an emergency alert plan in case I fall - pick up clutter from the floors - use a nonslip pad with throw rugs, or remove them completely - use a cane or walker - use a nightlight in the bathroom  Follow Up Plan: Telephone follow up appointment with care management team member scheduled for: 08/23/20    Patient Care Plan: Depression (Adult)    Problem Identified: Symptoms (Depression)   Priority: High    Long-Range Goal: Symptoms Monitored and Managed   Start Date: 07/09/2020  Expected End Date: 01/09/2021  This Visit's Progress: On track  Priority: High  Note:   Current Barriers:   Ineffective Self Health Maintenance   Lacks social connections Clinical Goal(s):  Marland Kitchen Collaboration with Arnette Felts, FNP regarding development and update of comprehensive plan of care as evidenced by provider attestation and co-signature . Inter-disciplinary care team collaboration (see longitudinal plan of care)  patient will work with care management team to address care coordination and chronic disease management needs related to Disease Management  Educational Needs  Care Coordination  Medication Management and Education  Psychosocial Support   Interventions:  07/09/20 completed successful outbound call with patient   Evaluation of current treatment plan related to Depression, self-management and patient's adherence to plan as established by provider.  Collaboration with Arnette Felts, FNP regarding development and update of comprehensive plan of care as evidenced by provider attestation       and co-signature  Inter-disciplinary care team collaboration (see longitudinal plan of care) . Provided education to patient about basic disease process related to Depression . Review of patient status, including review of consultants reports, relevant laboratory and other test results, and medications completed. . Reviewed medications with patient and discussed  importance of medication adherence . Determined patient is grieving after the loss of her foster daughter; She has lost several other members of her family . Determined patient lacks social connections but uses prayer and meditation to help her cope . Determined patient will decline f/u with outpatient Psychiatry at this time but will continue to keep her PCP informed of how she's doing  . Instructed patient to contact the CCM team and or PCP for worsening or persistent symptoms right away  Discussed plans with patient for ongoing care management follow up and provided patient with direct contact information for care management team Self Care Activities:  . - avoid negative self-talk . - develop a plan to deal with triggers like holidays, anniversaries . - exercise at least 2 to 3 times per week . - have a plan for how to handle bad days . - journal feelings and what helps to feel better or worse . - spend time or talk with others at least 2 to 3 times per week . - watch for early signs of feeling worse Patient Goals: - to be less depressed   Follow Up Plan: Telephone follow up appointment with care management team member scheduled for: 08/23/20    Patient Care Plan: Vitamin D deficiency    Problem Identified: Vitamin D deficiency   Priority: Medium    Long-Range Goal: Vitamin D deficiency - treatment optimized   Start Date: 07/09/2020  Expected End Date: 07/09/2021  This Visit's Progress: On track  Priority: Medium  Note:   Current Barriers:   Ineffective Self Health Maintenance  Clinical Goal(s):  Marland Kitchen. Collaboration with Arnette FeltsMoore, Janece, FNP regarding development and update of comprehensive plan of care as evidenced by provider attestation and co-signature . Inter-disciplinary care team collaboration (see longitudinal plan of care)  patient will work with care management team to address care coordination and chronic disease management needs related to Disease Management  Educational  Needs  Care Coordination  Medication Management and Education  Medication Reconciliation  Psychosocial Support   Interventions:  07/09/20 completed successful outbound call with patient   Evaluation of current treatment plan related to  Vitamin D deficiency , self-management and patient's adherence to plan as established by provider.  Collaboration with Arnette FeltsMoore, Janece, FNP regarding development and update of comprehensive plan of care as evidenced by provider attestation       and co-signature  Inter-disciplinary care team collaboration (see longitudinal plan of care) . Provided education to patient about basic disease process related to Vitamin D deficiency . Review of patient status, including review of consultants reports, relevant laboratory and other test results, and medications completed. . Reviewed medications with patient and discussed importance of medication adherence . Educated patient on dietary recommendations and encouraged patient to get at least 15 minutes of natural sunlight daily when possible . Mailed printed educational materials related to Vitamin D deficiency and FAQ  Discussed plans with patient for ongoing care management follow up and provided patient with direct contact information for care management team Self Care Activities:  . Continue to keep all scheduled follow up appointments . Take medications as directed  . Let your healthcare team know if you are unable to take your medications . Call your pharmacy for refills at least 7 days prior to running out of medication . Eat Vitamin D rich foods, try to get at least 15 minutes of sunlight daily when able  . Review patient educational materials related to Vitamin D deficiency and FAQ Patient Goals: - maintain and or improve Vitamin D deficiency  Follow Up Plan: Telephone follow up appointment with care management team member scheduled for: 08/23/20

## 2020-07-23 NOTE — Chronic Care Management (AMB) (Signed)
Chronic Care Management   CCM RN Visit Note  07/09/2020 Name: [name] MRN: [id number] DOB: 11/01/1940  Subjective: [name] is a 80 y.o. year old female who is a primary care patient of Arnette Felts, FNP. The care management team was consulted for assistance with disease management and care coordination needs.    Engaged with patient by telephone for initial visit in response to provider referral for case management and/or care coordination services.   Consent to Services:  The patient was given information about Chronic Care Management services, agreed to services, and gave verbal consent prior to initiation of services.  Please see initial visit note for detailed documentation.   Patient agreed to services and verbal consent obtained.   Assessment: Review of patient past medical history, allergies, medications, health status, including review of consultants reports, laboratory and other test data, was performed as part of comprehensive evaluation and provision of chronic care management services.   SDOH (Social Determinants of Health) assessments and interventions performed:    CCM Care Plan  Allergies  Allergen Reactions  . Aspirin Anaphylaxis and Hives  . Elavil [Amitriptyline] Anaphylaxis  . Latex   . Relafen [Nabumetone] Hives    Outpatient Encounter Medications as of 07/09/2020  Medication Sig  . acetaminophen (TYLENOL) 500 MG tablet Take 1,000 mg by mouth every 6 (six) hours as needed.  . Boswellia-Glucosamine-Vit D (OSTEO BI-FLEX ONE PER DAY PO) Take by mouth.  . Calcium Carbonate-Vit D-Min (CALCIUM 1200 PO) Take 1 capsule by mouth daily.  . cetirizine (ZYRTEC) 10 MG tablet Take 10 mg by mouth daily.  . Cholecalciferol (VITAMIN D) 2000 units tablet Take 2,000 Units by mouth daily.  . clopidogrel (PLAVIX) 75 MG tablet TAKE 1 TABLET BY MOUTH EVERY DAY  . fluticasone (FLONASE) 50 MCG/ACT nasal spray Place into both nostrils daily as needed for allergies or  rhinitis.  . Omega-3 Fatty Acids (FISH OIL PO) Take 1 capsule by mouth daily. Takes it sometimes  . Polyethyl Glycol-Propyl Glycol (SYSTANE OP) Apply to eye. As needed  . pregabalin (LYRICA) 25 MG capsule Take 1 capsule 3 times daily  . sertraline (ZOLOFT) 50 MG tablet TAKE 1 TABLET BY MOUTH EVERY DAY  . topiramate (TOPAMAX) 25 MG tablet Take one tablet in the morning and 3 in the evening  . vitamin C (ASCORBIC ACID) 250 MG tablet Take 250 mg by mouth daily.    Facility-Administered Encounter Medications as of 07/09/2020  Medication  . cyanocobalamin ((VITAMIN B-12)) injection 1,000 mcg    Patient Active Problem List   Diagnosis Date Noted  . Posterior vitreous detachment of left eye 01/19/2020  . Exudative age-related macular degeneration of right eye with active choroidal neovascularization (HCC) 10/26/2019  . 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 Intermediate stage nonexudative age-related macular degeneration of both eyes 10/26/2019  . Vitamin D deficiency 06/28/2019  . Abnormal glucose 10/21/2018  . History of TIA (transient ischemic attack) 06/17/2018  . Recurrent major depressive disorder, in partial remission (HCC) 06/17/2018  . Seasonal allergies 06/17/2018  . Depression 02/11/2018  . Hiatal hernia 02/11/2018  . Dysphagia 02/11/2018  . Hyperkalemia 02/11/2018  . Abnormality of gait 04/10/2015  . Cervical spondylosis without myelopathy 07/18/2013  . Left-sided weakness 05/29/2013  . Left sided numbness 05/29/2013  . Headache 05/29/2013  . TIA (transient ischemic attack) 05/29/2013  . Neck pain on left side 05/29/2013  . Fibromyalgia     Conditions to be addressed/monitored:Recurrent major depression, Atherosclerotic cardiovascular  disease, Vitamin D deficiency, Memory changes, History of TIA, Fall, initial encounter, Elevated cholesterol    Care Plan : Assist with Chronic Care Management and Care Coordination needs  Updates made by Riley ChurchesLittle,  Jonny Longino L, RN since 07/23/2020 12:00 AM  Completed 07/23/2020  Problem: Assist with Chronic Care Management and Care Coordination needs Resolved 07/09/2020  Priority: High    Goal: Assist with Chronic Care Management and Care Coordination needs Completed 07/09/2020  Start Date: 06/18/2020  Expected End Date: 08/17/2020  Recent Progress: On track  Priority: High  Note:   Current Barriers:  Marland Kitchen. Knowledge Barriers related to resources and support available to address needs related to Recurrent major depression, Atherosclerotic cardiovascular disease, Vitamin D deficiency.   Case Manager Clinical Goal(s):  Marland Kitchen. Over the next 60 days, patient will work with the CCM team to address needs related to Chronic disease management and Care Coordination needs for Recurrent major depression, Atherosclerotic cardiovascular disease, Vitamin D deficiency.   Interventions:  . Collaborated with  embedded BSW Bevelyn NgoKendra Humble  to establish an individualized plan of care   Patient Self Care Activities:  . Patient will work with the CCM team to address care coordination needs and will continue to work with the clinical team to address health care and disease management related needs.    Initial RN CM Call scheduled for 07/06/20 @3 :30 PM     Care Plan : Fall Risk (Adult)  Updates made by Riley ChurchesLittle, Shenaya Lebo L, RN since 07/23/2020 12:00 AM    Problem: Fall Risk   Priority: High    Long-Range Goal: Absence of Fall and Fall-Related Injury   Start Date: 07/09/2020  Expected End Date: 01/09/2021  This Visit's Progress: On track  Priority: High  Note:   Current Barriers:  Marland Kitchen. Knowledge Deficits related to fall precautions in patient with Recurrent major depression, Atherosclerotic cardiovascular disease, Vitamin D deficiency, Memory changes, History of TIA, Fall, initial encounter, Elevated cholesterol   . Decreased adherence to prescribed treatment for fall prevention . Lacks social connections Clinical Goal(s):  . patient will  demonstrate improved adherence to prescribed treatment plan for decreasing falls as evidenced by patient reporting and review of EMR . patient will verbalize using fall risk reduction strategies discussed . patient will not experience additional falls Interventions:  07/09/20 completed successful outbound call with patient  . Collaboration with Arnette FeltsMoore, Janece, FNP regarding development and update of comprehensive plan of care as evidenced by provider attestation and co-signature . Inter-disciplinary care team collaboration (see longitudinal plan of care) . Evaluation of current treatment plan related to Abnormal gait/falls and patient's adherence to plan as established by provider. . Provided written and verbal education re: Potential causes of falls and Fall prevention strategies . Reviewed medications and discussed potential side effects of medications such as dizziness and frequent urination . Assessed for s/s of orthostatic hypotension . Assessed for falls since last encounter. . Assessed patients knowledge of fall risk prevention secondary to previously provided education. . Assessed working status of life alert bracelet and patient adherence . Provided patient information for fall alert systems . Discussed plans with patient for ongoing care management follow up and provided patient with direct contact information for care management team Patient Self-Care Activities:  . Continue to keep all scheduled follow up appointments . Take medications as directed  . Let your healthcare team know if you are unable to take your medications . Call your pharmacy for refills at least 7 days prior to running out of medication  Patient Goals:  - always wear low-heeled or flat shoes or slippers with nonskid soles - keep my cell phone with me always - learn how to get back up if I fall - make an emergency alert plan in case I fall - pick up clutter from the floors - use a nonslip pad with throw rugs, or  remove them completely - use a cane or walker - use a nightlight in the bathroom  Follow Up Plan: Telephone follow up appointment with care management team member scheduled for: 08/23/20    Care Plan : Depression (Adult)  Updates made by Riley Churches, RN since 07/23/2020 12:00 AM    Problem: Symptoms (Depression)   Priority: High    Long-Range Goal: Symptoms Monitored and Managed   Start Date: 07/09/2020  Expected End Date: 01/09/2021  This Visit's Progress: On track  Priority: High  Note:   Current Barriers:   Ineffective Self Health Maintenance   Lacks social connections Clinical Goal(s):  Marland Kitchen Collaboration with Arnette Felts, FNP regarding development and update of comprehensive plan of care as evidenced by provider attestation and co-signature . Inter-disciplinary care team collaboration (see longitudinal plan of care)  patient will work with care management team to address care coordination and chronic disease management needs related to Disease Management  Educational Needs  Care Coordination  Medication Management and Education  Psychosocial Support   Interventions:  07/09/20 completed successful outbound call with patient   Evaluation of current treatment plan related to Depression, self-management and patient's adherence to plan as established by provider.  Collaboration with Arnette Felts, FNP regarding development and update of comprehensive plan of care as evidenced by provider attestation       and co-signature  Inter-disciplinary care team collaboration (see longitudinal plan of care) . Provided education to patient about basic disease process related to Depression . Review of patient status, including review of consultants reports, relevant laboratory and other test results, and medications completed. . Reviewed medications with patient and discussed importance of medication adherence . Determined patient is grieving after the loss of her foster daughter; She  has lost several other members of her family . Determined patient lacks social connections but uses prayer and meditation to help her cope . Determined patient will decline f/u with outpatient Psychiatry at this time but will continue to keep her PCP informed of how she's doing  . Instructed patient to contact the CCM team and or PCP for worsening or persistent symptoms right away  Discussed plans with patient for ongoing care management follow up and provided patient with direct contact information for care management team Self Care Activities:  . - avoid negative self-talk . - develop a plan to deal with triggers like holidays, anniversaries . - exercise at least 2 to 3 times per week . - have a plan for how to handle bad days . - journal feelings and what helps to feel better or worse . - spend time or talk with others at least 2 to 3 times per week . - watch for early signs of feeling worse Patient Goals: - to be less depressed   Follow Up Plan: Telephone follow up appointment with care management team member scheduled for: 08/23/20    Care Plan : Vitamin D deficiency  Updates made by Riley Churches, RN since 07/23/2020 12:00 AM    Problem: Vitamin D deficiency   Priority: Medium    Long-Range Goal: Vitamin D deficiency - treatment optimized   Start  Date: 07/09/2020  Expected End Date: 07/09/2021  This Visit's Progress: On track  Priority: Medium  Note:   Current Barriers:   Ineffective Self Health Maintenance  Clinical Goal(s):  Marland Kitchen Collaboration with Arnette Felts, FNP regarding development and update of comprehensive plan of care as evidenced by provider attestation and co-signature . Inter-disciplinary care team collaboration (see longitudinal plan of care)  patient will work with care management team to address care coordination and chronic disease management needs related to Disease Management  Educational Needs  Care Coordination  Medication Management and  Education  Medication Reconciliation  Psychosocial Support   Interventions:  07/09/20 completed successful outbound call with patient   Evaluation of current treatment plan related to  Vitamin D deficiency , self-management and patient's adherence to plan as established by provider.  Collaboration with Arnette Felts, FNP regarding development and update of comprehensive plan of care as evidenced by provider attestation       and co-signature  Inter-disciplinary care team collaboration (see longitudinal plan of care) . Provided education to patient about basic disease process related to Vitamin D deficiency . Review of patient status, including review of consultants reports, relevant laboratory and other test results, and medications completed. . Reviewed medications with patient and discussed importance of medication adherence . Educated patient on dietary recommendations and encouraged patient to get at least 15 minutes of natural sunlight daily when possible . Mailed printed educational materials related to Vitamin D deficiency and FAQ  Discussed plans with patient for ongoing care management follow up and provided patient with direct contact information for care management team Self Care Activities:  . Continue to keep all scheduled follow up appointments . Take medications as directed  . Let your healthcare team know if you are unable to take your medications . Call your pharmacy for refills at least 7 days prior to running out of medication . Eat Vitamin D rich foods, try to get at least 15 minutes of sunlight daily when able  . Review patient educational materials related to Vitamin D deficiency and FAQ Patient Goals: - maintain and or improve Vitamin D deficiency  Follow Up Plan: Telephone follow up appointment with care management team member scheduled for: 08/23/20     Plan:Telephone follow up appointment with care management team member scheduled for:  08/23/20  Delsa Sale, RN, BSN, CCM Care Management Coordinator [facility] Care Management/[facility]  Direct Phone: [phone]

## 2020-08-08 ENCOUNTER — Telehealth: Payer: Medicare Other

## 2020-08-08 ENCOUNTER — Telehealth: Payer: Self-pay

## 2020-08-08 NOTE — Telephone Encounter (Signed)
  Care Management   Follow Up Note   08/08/2020 Name: [name] MRN: [id number] DOB: 1940-07-14   Referred by: Arnette Felts, FNP Reason for referral : Chronic Care Management (Unsuccessful call attempt)   A second unsuccessful telephone outreach was attempted today. The patient was referred to the case management team for assistance with care management and care coordination. SW left a HIPAA compliant voice message requesting a return call.  Follow Up Plan: The care management team will reach out to the patient again over the next 21 days.   Bevelyn Ngo, BSW, CDP Social Worker, Certified Dementia Practitioner [facility] Care Management [phone]

## 2020-08-09 ENCOUNTER — Other Ambulatory Visit: Payer: Self-pay | Admitting: Nurse Practitioner

## 2020-08-09 DIAGNOSIS — Z8673 Personal history of transient ischemic attack (TIA), and cerebral infarction without residual deficits: Secondary | ICD-10-CM

## 2020-08-20 ENCOUNTER — Encounter (INDEPENDENT_AMBULATORY_CARE_PROVIDER_SITE_OTHER): Payer: Self-pay | Admitting: Ophthalmology

## 2020-08-20 ENCOUNTER — Ambulatory Visit (INDEPENDENT_AMBULATORY_CARE_PROVIDER_SITE_OTHER): Payer: Medicare Other | Admitting: Ophthalmology

## 2020-08-20 ENCOUNTER — Other Ambulatory Visit: Payer: Self-pay

## 2020-08-20 DIAGNOSIS — H35721 Serous detachment of retinal pigment epithelium, right eye: Secondary | ICD-10-CM

## 2020-08-20 DIAGNOSIS — H353211 Exudative age-related macular degeneration, right eye, with active choroidal neovascularization: Secondary | ICD-10-CM | POA: Diagnosis not present

## 2020-08-20 MED ORDER — BEVACIZUMAB 2.5 MG/0.1ML IZ SOSY
2.5000 mg | PREFILLED_SYRINGE | INTRAVITREAL | Status: AC | PRN
  Administered 2020-08-20: 2.5 mg via INTRAVITREAL

## 2020-08-20 NOTE — Assessment & Plan Note (Signed)
Today at 8-week follow-up, increased subretinal fluid.  We will need to shorten interval of examination and treatment of wet AMD

## 2020-08-20 NOTE — Assessment & Plan Note (Signed)
Today at 8-week follow-up, serous subretinal fluid increased at 8-week follow-up post injection Avastin.  Will need repeat injection today and follow-up again next in 6 weeks

## 2020-08-20 NOTE — Progress Notes (Signed)
08/20/2020     CHIEF COMPLAINT Patient presents for Retina Follow Up (8 Wk F/U OD, poss Avastin OD//Pt sts she can see TV "really good" with glasses OU. Pt c/o difficulty seeing small print when reading OU.)   HISTORY OF PRESENT ILLNESS: [name] is a 80 y.o. female who presents to the clinic today for:   HPI     Retina Follow Up           Diagnosis: Wet AMD   Laterality: right eye   Onset: 8 weeks ago   Severity: mild   Duration: 8 weeks   Course: stable   Comments: 8 Wk F/U OD, poss Avastin OD  Pt sts she can see TV "really good" with glasses OU. Pt c/o difficulty seeing small print when reading OU.       Last edited by Ileana RoupKennerly, Paige, COA on 08/20/2020  2:03 PM.      Referring physician: Arnette FeltsMoore, Janece, FNP [address] [place] [id number]  HISTORICAL INFORMATION:   Selected notes from the MEDICAL RECORD NUMBER    Lab Results  Component Value Date   HGBA1C 5.5 10/27/2019     CURRENT MEDICATIONS: Current Outpatient Medications (Ophthalmic Drugs)  Medication Sig   Polyethyl Glycol-Propyl Glycol (SYSTANE OP) Apply to eye. As needed   No current facility-administered medications for this visit. (Ophthalmic Drugs)   Current Outpatient Medications (Other)  Medication Sig   acetaminophen (TYLENOL) 500 MG tablet Take 1,000 mg by mouth every 6 (six) hours as needed.   Boswellia-Glucosamine-Vit D (OSTEO BI-FLEX ONE PER DAY PO) Take by mouth.   Calcium Carbonate-Vit D-Min (CALCIUM 1200 PO) Take 1 capsule by mouth daily.   cetirizine (ZYRTEC) 10 MG tablet Take 10 mg by mouth daily.   Cholecalciferol (VITAMIN D) 2000 units tablet Take 2,000 Units by mouth daily.   clopidogrel (PLAVIX) 75 MG tablet TAKE 1 TABLET BY MOUTH EVERY DAY   fluticasone (FLONASE) 50 MCG/ACT nasal spray Place into both nostrils daily as needed for allergies or rhinitis.   Omega-3 Fatty Acids (FISH OIL PO) Take 1 capsule by mouth daily. Takes it sometimes   pregabalin  (LYRICA) 25 MG capsule Take 1 capsule 3 times daily   sertraline (ZOLOFT) 50 MG tablet TAKE 1 TABLET BY MOUTH EVERY DAY   topiramate (TOPAMAX) 25 MG tablet Take one tablet in the morning and 3 in the evening   vitamin C (ASCORBIC ACID) 250 MG tablet Take 250 mg by mouth daily.    Current Facility-Administered Medications (Other)  Medication Route   cyanocobalamin ((VITAMIN B-12)) injection 1,000 mcg Intramuscular      REVIEW OF SYSTEMS:    ALLERGIES Allergies  Allergen Reactions   Aspirin Anaphylaxis and Hives   Elavil [Amitriptyline] Anaphylaxis   Latex    Relafen [Nabumetone] Hives    PAST MEDICAL HISTORY Past Medical History:  Diagnosis Date   Abnormality of gait 04/10/2015   Cervical spondylosis without myelopathy 07/18/2013   Common migraine with intractable migraine 04/10/2015   DJD (degenerative joint disease)    Fibromyalgia    Hiatal hernia    Migraine headache    Vitreomacular adhesion of right eye 03/08/2020   Past Surgical History:  Procedure Laterality Date   ABDOMINAL HYSTERECTOMY     DILATION AND CURETTAGE OF UTERUS     HIATAL HERNIA REPAIR     TONSILLECTOMY      FAMILY HISTORY Family History  Problem Relation Age of Onset   CVA  Mother    Stroke Father    Emphysema Father     SOCIAL HISTORY Social History   Tobacco Use   Smoking status: Never   Smokeless tobacco: Never  Vaping Use   Vaping Use: Never used  Substance Use Topics   Alcohol use: No    Comment: very occasional   Drug use: No         OPHTHALMIC EXAM:  Base Eye Exam     Visual Acuity (ETDRS)       Right Left   Dist [place] 20/25 -1 20/30   Dist ph [place]  NI         Tonometry (Tonopen, 2:07 PM)       Right Left   Pressure 08 05         Pupils       Pupils Dark Light Shape React APD   Right PERRL 4 3 Round Minimal None   Left PERRL 4 3 Round Minimal None         Visual Fields (Counting fingers)       Left Right    Full Full         Extraocular  Movement       Right Left    Full Full         Neuro/Psych     Oriented x3: Yes   Mood/Affect: Normal         Dilation     Right eye: 1.0% Mydriacyl, 2.5% Phenylephrine @ 2:07 PM           Slit Lamp and Fundus Exam     External Exam       Right Left   External Normal Normal         Slit Lamp Exam       Right Left   Lids/Lashes Normal Normal   Conjunctiva/Sclera White and quiet White and quiet   Cornea Clear Clear   Anterior Chamber Deep and quiet Deep and quiet   Iris Round and reactive Round and reactive   Lens 2+ Nuclear sclerosis 2+ Nuclear sclerosis   Anterior Vitreous Normal Normal         Fundus Exam       Right Left   Posterior Vitreous Central vitreous floaters    Disc Normal    C/D Ratio 0.2    Macula less Macular thickening, Smaller Subretinal neovascular membrane, Retinal pigment epithelial mottling and hyperpigmentation subfoveal, Early age related macular degeneration    Vessels Normal    Periphery Normal             IMAGING AND PROCEDURES  Imaging and Procedures for 08/20/20  OCT, Retina - OU - Both Eyes       Right Eye Quality was good. Scan locations included subfoveal. Central Foveal Thickness: 298. Progression has worsened. Findings include vitreous traction, no IRF, abnormal foveal contour, pigment epithelial detachment.   Left Eye Quality was good. Scan locations included subfoveal. Central Foveal Thickness: 269. Progression has been stable. Findings include retinal drusen .   Notes Small residual cyst serous subretinal detachment temporal to the fovea much improved though compared to August, OD vastly improved overall.  Apparent posterior vitreous detachment now  OS with incidental posterior vitreous detachment     Intravitreal Injection, Pharmacologic Agent - OD - Right Eye       Time Out 08/20/2020. 2:57 PM. Confirmed correct patient, procedure, site, and patient consented.   Anesthesia Topical anesthesia  was used. Anesthetic medications  included [place] 3.5%.   Procedure Preparation included Ofloxacin , Tobramycin 0.3%, 10% betadine to eyelids, 5% betadine to ocular surface. A 30 gauge needle was used.   Injection: 2.5 mg bevacizumab 2.5 MG/0.1ML   Route: Intravitreal   NDC: 97416-384-53   Post-op Post injection exam found visual acuity of at least counting fingers. The patient tolerated the procedure well. There were no complications. The patient received written and verbal post procedure care education. Post injection medications were not given.              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  We will need to shorten interval of examination and treatment of wet AMD  Exudative age-related macular degeneration of right eye with active choroidal neovascularization (HCC) Today at 8-week follow-up, serous subretinal fluid increased at 8-week follow-up post injection Avastin.  Will need repeat injection today and follow-up again next in 6 weeks     ICD-10-CM   1. Exudative age-related macular degeneration of right eye with active choroidal neovascularization (HCC)  H35.3211 OCT, Retina - OU - Both Eyes    Intravitreal Injection, Pharmacologic Agent - OD - Right Eye    bevacizumab (AVASTIN) SOSY 2.5 mg    2. 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  Increased subretinal fluid serous retinal detachment as component of wet AMD OD, at 8-week interval today, repeat injection today and examination next OD in 6-week  2.  Patient asked if poor sleep can affect this and I explained to her that if she has sleep apnea that that may need indeed have an effect on macular oxygenation and should be checked out if she has concerns in this regard  3.  Ophthalmic Meds Ordered this visit:  Meds ordered this encounter  Medications   bevacizumab (AVASTIN) SOSY 2.5 mg        Return in about 6 weeks (around 10/01/2020) for dilate, OD, AVASTIN OCT.  There are no Patient Instructions on file for this visit.   Explained the diagnoses, plan, and follow up with the patient and they expressed understanding.  Patient expressed understanding of the importance of proper follow up care.   Alford Highland Kayona Foor M.D. Diseases & Surgery of the Retina and Vitreous [facility] 08/20/20     Abbreviations: M myopia (nearsighted); A astigmatism; H hyperopia (farsighted); P presbyopia; Mrx spectacle prescription;  CTL contact lenses; OD right eye; OS left eye; OU both eyes  XT exotropia; ET esotropia; PEK punctate epithelial keratitis; PEE punctate epithelial erosions; DES dry eye syndrome; MGD meibomian gland dysfunction; ATs artificial tears; PFAT's preservative free artificial tears; NSC nuclear sclerotic cataract; PSC posterior subcapsular cataract; ERM epi-retinal membrane; PVD posterior vitreous detachment; RD retinal detachment; DM diabetes mellitus; DR diabetic retinopathy; NPDR non-proliferative diabetic retinopathy; PDR proliferative diabetic retinopathy; CSME clinically significant macular edema; DME diabetic macular edema; dbh dot blot hemorrhages; CWS cotton wool spot; POAG primary open angle glaucoma; C/D cup-to-disc ratio; HVF humphrey visual field; GVF goldmann visual field; OCT optical coherence tomography; IOP intraocular pressure; BRVO Branch retinal vein occlusion; CRVO central retinal vein occlusion; CRAO central retinal artery occlusion; BRAO branch retinal artery occlusion; RT retinal tear; SB scleral buckle; PPV pars plana vitrectomy; VH Vitreous hemorrhage; PRP panretinal laser photocoagulation; IVK intravitreal kenalog; VMT vitreomacular traction; MH Macular hole;  NVD neovascularization of the disc; NVE neovascularization elsewhere; AREDS age related eye disease study; ARMD  age related macular degeneration; POAG primary open angle glaucoma; EBMD  epithelial/anterior basement membrane dystrophy; ACIOL anterior chamber intraocular lens; IOL intraocular lens; PCIOL posterior chamber intraocular lens; Phaco/IOL phacoemulsification with intraocular lens placement; PRK photorefractive keratectomy; LASIK laser assisted in situ keratomileusis; HTN hypertension; DM diabetes mellitus; COPD chronic obstructive pulmonary disease

## 2020-08-22 ENCOUNTER — Encounter: Payer: Self-pay | Admitting: Nurse Practitioner

## 2020-08-22 ENCOUNTER — Ambulatory Visit (INDEPENDENT_AMBULATORY_CARE_PROVIDER_SITE_OTHER): Payer: Medicare Other | Admitting: Nurse Practitioner

## 2020-08-22 ENCOUNTER — Other Ambulatory Visit: Payer: Self-pay

## 2020-08-22 VITALS — BP 114/80 | HR 72 | Temp 98.3°F | Ht 64.0 in | Wt 156.8 lb

## 2020-08-22 DIAGNOSIS — E538 Deficiency of other specified B group vitamins: Secondary | ICD-10-CM

## 2020-08-22 DIAGNOSIS — R413 Other amnesia: Secondary | ICD-10-CM | POA: Diagnosis not present

## 2020-08-22 DIAGNOSIS — F3341 Major depressive disorder, recurrent, in partial remission: Secondary | ICD-10-CM | POA: Diagnosis not present

## 2020-08-22 DIAGNOSIS — Z139 Encounter for screening, unspecified: Secondary | ICD-10-CM | POA: Diagnosis not present

## 2020-08-22 DIAGNOSIS — E7800 Pure hypercholesterolemia, unspecified: Secondary | ICD-10-CM

## 2020-08-22 DIAGNOSIS — E559 Vitamin D deficiency, unspecified: Secondary | ICD-10-CM

## 2020-08-22 MED ORDER — CYANOCOBALAMIN 1000 MCG/ML IJ SOLN
1000.0000 ug | Freq: Once | INTRAMUSCULAR | Status: AC
  Administered 2020-08-22: 1000 ug via INTRAMUSCULAR

## 2020-08-22 NOTE — Progress Notes (Signed)
I,Yamilka Roman [employer] as a Education administrator for [employer], FNP.,have documented all relevant documentation on the behalf of [name], FNP,as directed by  [name], FNP while in the presence of [name], [place].  This visit occurred during the SARS-CoV-2 public health emergency.  Safety protocols were in place, including screening questions prior to the visit, additional usage of staff PPE, and extensive cleaning of exam room while observing appropriate contact time as indicated for disinfecting solutions.  Subjective:     Patient ID: [name] , female    DOB: 1940-03-22 , 80 y.o.   MRN: [id number]   Chief Complaint  Patient presents with   Depression   vitamin b12 recheck    HPI  Patient presents today for a f/u on her depression and her vitamin b12.   She had an injection to her right eye on Monday - she will have this done every 6 weeks.   Patient does not feel like she had the chicken pox.   Depression        This is a chronic problem.  The current episode started more than 1 month ago.   The onset quality is gradual.   Associated symptoms include no decreased concentration and no fatigue.     The symptoms are aggravated by nothing.  Compliance with treatment is good.   Pertinent negatives include , no chronic pain and no anxiety.   Past Medical History:  Diagnosis Date   Abnormality of gait 04/10/2015   Cervical spondylosis without myelopathy 07/18/2013   Common migraine with intractable migraine 04/10/2015   DJD (degenerative joint disease)    Fibromyalgia    Hiatal hernia    Migraine headache    Vitreomacular adhesion of right eye 03/08/2020     Family History  Problem Relation Age of Onset   CVA Mother    Stroke Father    Emphysema Father      Current Outpatient Medications:    acetaminophen (TYLENOL) 500 MG tablet, Take 1,000 mg by mouth every 6 (six) hours as needed., Disp: , Rfl:    Boswellia-Glucosamine-Vit D (OSTEO BI-FLEX ONE PER DAY PO), Take  by mouth., Disp: , Rfl:    Calcium Carbonate-Vit D-Min (CALCIUM 1200 PO), Take 1 capsule by mouth daily., Disp: , Rfl:    cetirizine (ZYRTEC) 10 MG tablet, Take 10 mg by mouth daily., Disp: , Rfl:    Cholecalciferol (VITAMIN D) 2000 units tablet, Take 2,000 Units by mouth daily., Disp: , Rfl:    clopidogrel (PLAVIX) 75 MG tablet, TAKE 1 TABLET BY MOUTH EVERY DAY, Disp: 90 tablet, Rfl: 1   fluticasone (FLONASE) 50 MCG/ACT nasal spray, Place into both nostrils daily as needed for allergies or rhinitis., Disp: , Rfl:    Omega-3 Fatty Acids (FISH OIL PO), Take 1 capsule by mouth daily. Takes it sometimes, Disp: , Rfl:    Polyethyl Glycol-Propyl Glycol (SYSTANE OP), Apply to eye. As needed, Disp: , Rfl:    pregabalin (LYRICA) 25 MG capsule, Take 1 capsule 3 times daily, Disp: 270 capsule, Rfl: 1   sertraline (ZOLOFT) 50 MG tablet, TAKE 1 TABLET BY MOUTH EVERY DAY, Disp: 90 tablet, Rfl: 1   topiramate (TOPAMAX) 25 MG tablet, Take one tablet in the morning and 3 in the evening, Disp: 360 tablet, Rfl: 3   vitamin C (ASCORBIC ACID) 250 MG tablet, Take 250 mg by mouth daily. , Disp: , Rfl:   Current Facility-Administered Medications:    cyanocobalamin ((VITAMIN B-12)) injection 1,000 mcg,  1,000 mcg, Intramuscular, Once, [name], FNP   Allergies  Allergen Reactions   Aspirin Anaphylaxis and Hives   Elavil [Amitriptyline] Anaphylaxis   Latex    Relafen [Nabumetone] Hives     Review of Systems  Constitutional: Negative.  Negative for fatigue.  HENT: Negative.    Eyes: Negative.   Respiratory: Negative.    Cardiovascular: Negative.  Negative for chest pain, palpitations and leg swelling.  Gastrointestinal: Negative.   Endocrine: Negative.   Genitourinary: Negative.   Musculoskeletal: Negative.   Skin: Negative.   Allergic/Immunologic: Negative.   Neurological: Negative.   Hematological: Negative.   Psychiatric/Behavioral:  Positive for depression. Negative for decreased concentration.      Today's Vitals   08/22/20 1012  BP: 114/80  Pulse: 72  Temp: 98.3 F (36.8 C)  Weight: 156 lb 12.8 oz (71.1 kg)  Height: _0  (1.626 m)   Body mass index is 26.91 kg/m.   Objective:  Physical Exam Vitals reviewed.  Constitutional:      General: She is not in acute distress.    Appearance: Normal appearance.  Cardiovascular:     Rate and Rhythm: Normal rate and regular rhythm.     Pulses: Normal pulses.     Heart sounds: No murmur heard. Pulmonary:     Effort: Pulmonary effort is normal. No respiratory distress.     Breath sounds: Normal breath sounds.  Neurological:     General: No focal deficit present.     Mental Status: She is alert and oriented to person, place, and time.     Cranial Nerves: No cranial nerve deficit.  Psychiatric:        Mood and Affect: Mood normal.        Behavior: Behavior normal.        Thought Content: Thought content normal.        Judgment: Judgment normal.        Assessment And Plan:     1. Recurrent major depressive disorder, in partial remission (HCC) Chronic, stable Continue current medications - CMP14+EGFR  2. B12 deficiency Chronic, she will get a vitamin B12 injection today and recheck her levels - Vitamin B12 - cyanocobalamin ((VITAMIN B-12)) injection 1,000 mcg  3. Memory changes She feels she is doing well.  - Vitamin B12 - cyanocobalamin ((VITAMIN B-12)) injection 1,000 mcg  4. Vitamin D deficiency Will check vitamin D level and supplement as needed.    Also encouraged to spend 15 minutes in the sun daily.  - VITAMIN D 25 Hydroxy (Vit-D Deficiency, Fractures)  5. Elevated cholesterol Chronic, no current medications Continue to avoid fried and fatty foods - Lipid panel  6. Encounter for screening Will check to see if she has an immunity to chicken pox as she is eligible for the shingles vaccine - Varicella zoster antibody, IgG    Patient was given opportunity to ask questions. Patient verbalized  understanding of the plan and was able to repeat key elements of the plan. All questions were answered to their satisfaction.  [name], FNP   I, [name], FNP, have reviewed all documentation for this visit. The documentation on 08/22/20 for the exam, diagnosis, procedures, and orders are all accurate and complete.   IF YOU HAVE BEEN REFERRED TO A SPECIALIST, IT MAY TAKE 1-2 WEEKS TO SCHEDULE/PROCESS THE REFERRAL. IF YOU HAVE NOT HEARD FROM US/SPECIALIST IN TWO WEEKS, PLEASE GIVE [place] A CALL AT [phone].   THE PATIENT IS ENCOURAGED TO PRACTICE SOCIAL DISTANCING  DUE TO THE COVID-19 PANDEMIC.

## 2020-08-23 ENCOUNTER — Ambulatory Visit (INDEPENDENT_AMBULATORY_CARE_PROVIDER_SITE_OTHER): Payer: Medicare Other

## 2020-08-23 ENCOUNTER — Telehealth: Payer: Medicare Other

## 2020-08-23 ENCOUNTER — Telehealth: Payer: Self-pay

## 2020-08-23 DIAGNOSIS — I2510 Atherosclerotic heart disease of native coronary artery without angina pectoris: Secondary | ICD-10-CM

## 2020-08-23 DIAGNOSIS — F3341 Major depressive disorder, recurrent, in partial remission: Secondary | ICD-10-CM

## 2020-08-23 DIAGNOSIS — E559 Vitamin D deficiency, unspecified: Secondary | ICD-10-CM

## 2020-08-23 DIAGNOSIS — W19XXXA Unspecified fall, initial encounter: Secondary | ICD-10-CM

## 2020-08-23 DIAGNOSIS — E7800 Pure hypercholesterolemia, unspecified: Secondary | ICD-10-CM

## 2020-08-23 DIAGNOSIS — Z8673 Personal history of transient ischemic attack (TIA), and cerebral infarction without residual deficits: Secondary | ICD-10-CM

## 2020-08-23 LAB — CMP14+EGFR
ALT: 6 IU/L (ref 0–32)
AST: 11 IU/L (ref 0–40)
Albumin/Globulin Ratio: 2 (ref 1.2–2.2)
Albumin: 4.4 g/dL (ref 3.7–4.7)
Alkaline Phosphatase: 99 IU/L (ref 44–121)
BUN/Creatinine Ratio: 18 (ref 12–28)
BUN: 19 mg/dL (ref 8–27)
Bilirubin Total: 0.3 mg/dL (ref 0.0–1.2)
CO2: 24 mmol/L (ref 20–29)
Calcium: 10 mg/dL (ref 8.7–10.3)
Chloride: 102 mmol/L (ref 96–106)
Creatinine, Ser: 1.03 mg/dL — ABNORMAL HIGH (ref 0.57–1.00)
Globulin, Total: 2.2 g/dL (ref 1.5–4.5)
Glucose: 89 mg/dL (ref 65–99)
Potassium: 4.7 mmol/L (ref 3.5–5.2)
Sodium: 140 mmol/L (ref 134–144)
Total Protein: 6.6 g/dL (ref 6.0–8.5)
eGFR: 56 mL/min/{1.73_m2} — ABNORMAL LOW (ref >59)

## 2020-08-23 LAB — LIPID PANEL
Chol/HDL Ratio: 4 ratio (ref 0.0–4.4)
Cholesterol, Total: 191 mg/dL (ref 100–199)
HDL: 48 mg/dL (ref >39)
LDL Chol Calc (NIH): 108 mg/dL — ABNORMAL HIGH (ref 0–99)
Triglycerides: 203 mg/dL — ABNORMAL HIGH (ref 0–149)
VLDL Cholesterol Cal: 35 mg/dL (ref 5–40)

## 2020-08-23 LAB — VARICELLA ZOSTER ANTIBODY, IGG: Varicella zoster IgG: 853 index (ref >165)

## 2020-08-23 LAB — VITAMIN D 25 HYDROXY (VIT D DEFICIENCY, FRACTURES): Vit D, 25-Hydroxy: 35.7 ng/mL (ref 30.0–100.0)

## 2020-08-23 LAB — VITAMIN B12: Vitamin B-12: 446 pg/mL (ref 232–1245)

## 2020-08-23 NOTE — Telephone Encounter (Signed)
  Care Management   Follow Up Note   08/23/2020 Name: MAANYA HIPPERT MRN: [id number] DOB: [birth year]   Referred by: Arnette Felts, FNP Reason for referral : Chronic Care Management (RNCM Follow up call - 1st attempt )   An unsuccessful telephone outreach was attempted today. The patient was referred to the case management team for assistance with care management and care coordination.   Follow Up Plan: A HIPPA compliant phone message was left for the patient providing contact information and requesting a return call.   Delsa Sale, RN, BSN, CCM Care Management Coordinator [facility] Care Management/[facility]  Direct Phone: [phone]

## 2020-08-23 NOTE — Patient Instructions (Signed)
Goals Addressed      Chronic Kidney disease - progression prevented or minimized   On track    Timeframe:  Long-Range Goal Priority:  Medium Start Date:  08/23/20                           Expected End Date: 08/23/21   Next Scheduled Follow up: 11/05/20         Self-Care Activities:  Continue to adhere to MD recommendations for CKD  Continue to keep all scheduled follow up appointments Take medications as directed  Let your healthcare team know if you are unable to take your medications Call your pharmacy for refills at least 7 days prior to running out of medication Increase your water intake unless otherwise directed Review mailed printed educational materials related to kidney disease Patient Goals: - maintain and or improve renal function                    Elevated Cholesterol - treatment optimized   On track    Timeframe:  Long-Range Goal Priority:  High Start Date: 08/23/20                            Expected End Date: 08/23/21  Next Scheduled Follow up: 11/05/20          Self Care Activities:  Self administers medications as prescribed Attends all scheduled provider appointments Calls pharmacy for medication refills Calls provider office for new concerns or questions Adhere to dietary and exercise recommendations                    Prevent Falls and Injury   On track    Timeframe:  Short-Term Goal Priority:  High Start Date: 07/09/20                            Expected End Date: 01/09/21                    Follow Up Date: 11/05/20    - always wear low-heeled or flat shoes or slippers with nonskid soles - keep my cell phone with me always - learn how to get back up if I fall - make an emergency alert plan in case I fall - pick up clutter from the floors - use a nonslip pad with throw rugs, or remove them completely - use a cane or walker - use a nightlight in the bathroom    Why is this important?   Most falls happen when it is hard for you to walk safely. Your  balance may be off because of an illness. You may have pain in your knees, hip or other joints.  You may be overly tired or taking medicines that make you sleepy. You may not be able to see or hear clearly.  Falls can lead to broken bones, bruises or other injuries.  There are things you can do to help prevent falling.     Notes:       Track and Manage My Symptoms-Depression   On track    Timeframe:  Long-Range Goal Priority:  High Start Date: 07/09/20                            Expected End Date: 07/10/21  Follow Up Date: 11/05/20    - avoid negative self-talk - develop a plan to deal with triggers like holidays, anniversaries - exercise at least 2 to 3 times per week - have a plan for how to handle bad days - journal feelings and what helps to feel better or worse - spend time or talk with others at least 2 to 3 times per week - watch for early signs of feeling worse    Why is this important?   Keeping track of your progress will help your treatment team find the right mix of medicine and therapy for you.  Write in your journal every day.  Day-to-day changes in depression symptoms are normal. It may be more helpful to check your progress at the end of each week instead of every day.     Notes:       Vitamin D deficiency - treatment optimized   On track    Timeframe:  Long-Range Goal Priority:  Medium Start Date:  07/09/20                           Expected End Date: 07/09/21  Next Scheduled Follow up date: 11/05/20    Self Care Activities:  Continue to keep all scheduled follow up appointments Take medications as directed  Let your healthcare team know if you are unable to take your medications Call your pharmacy for refills at least 7 days prior to running out of medication Eat Vitamin D rich foods, try to get at least 15 minutes of sunlight daily when able  Review patient educational materials related to Vitamin D deficiency and FAQ Patient Goals: -  maintain and or improve Vitamin D deficiency

## 2020-08-23 NOTE — Chronic Care Management (AMB) (Signed)
Chronic Care Management   CCM RN Visit Note  08/23/2020 Name: [name] MRN: [id number] DOB: 04/28/1940  Subjective: [name] is a 80 y.o. year old female who is a primary care patient of Arnette Felts, FNP. The care management team was consulted for assistance with disease management and care coordination needs.    Engaged with patient by telephone for follow up visit in response to provider referral for case management and/or care coordination services.   Consent to Services:  The patient was given information about Chronic Care Management services, agreed to services, and gave verbal consent prior to initiation of services.  Please see initial visit note for detailed documentation.   Patient agreed to services and verbal consent obtained.   Assessment: Review of patient past medical history, allergies, medications, health status, including review of consultants reports, laboratory and other test data, was performed as part of comprehensive evaluation and provision of chronic care management services.   SDOH (Social Determinants of Health) assessments and interventions performed:  Yes, no acute challenges   CCM Care Plan  Allergies  Allergen Reactions   Aspirin Anaphylaxis and Hives   Elavil [Amitriptyline] Anaphylaxis   Latex    Relafen [Nabumetone] Hives    Outpatient Encounter Medications as of 08/23/2020  Medication Sig   acetaminophen (TYLENOL) 500 MG tablet Take 1,000 mg by mouth every 6 (six) hours as needed.   Boswellia-Glucosamine-Vit D (OSTEO BI-FLEX ONE PER DAY PO) Take by mouth.   Calcium Carbonate-Vit D-Min (CALCIUM 1200 PO) Take 1 capsule by mouth daily.   cetirizine (ZYRTEC) 10 MG tablet Take 10 mg by mouth daily.   Cholecalciferol (VITAMIN D) 2000 units tablet Take 2,000 Units by mouth daily.   clopidogrel (PLAVIX) 75 MG tablet TAKE 1 TABLET BY MOUTH EVERY DAY   fluticasone (FLONASE) 50 MCG/ACT nasal spray Place into both nostrils daily as needed  for allergies or rhinitis.   Omega-3 Fatty Acids (FISH OIL PO) Take 1 capsule by mouth daily. Takes it sometimes   Polyethyl Glycol-Propyl Glycol (SYSTANE OP) Apply to eye. As needed   pregabalin (LYRICA) 25 MG capsule Take 1 capsule 3 times daily   sertraline (ZOLOFT) 50 MG tablet TAKE 1 TABLET BY MOUTH EVERY DAY   topiramate (TOPAMAX) 25 MG tablet Take one tablet in the morning and 3 in the evening   vitamin C (ASCORBIC ACID) 250 MG tablet Take 250 mg by mouth daily.    No facility-administered encounter medications on file as of 08/23/2020.    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 10/26/2019   Intermediate stage nonexudative age-related macular degeneration of both eyes 10/26/2019   Vitamin D deficiency 06/28/2019   Abnormal glucose 10/21/2018   History of TIA (transient ischemic attack) 06/17/2018   Recurrent major depressive disorder, in partial remission (HCC) 06/17/2018   Seasonal allergies 06/17/2018   Depression 02/11/2018   Hiatal hernia 02/11/2018   Dysphagia 02/11/2018   Hyperkalemia 02/11/2018   Abnormality of gait 04/10/2015   Cervical spondylosis without myelopathy 07/18/2013   Left-sided weakness 05/29/2013   Left sided numbness 05/29/2013   Headache 05/29/2013   TIA (transient ischemic attack) 05/29/2013   Neck pain on left side 05/29/2013   Fibromyalgia     Conditions to be addressed/monitored: Recurrent major depression, Atherosclerotic cardiovascular disease,  Vitamin D deficiency, Memory changes, History of TIA, Fall, initial encounter, Elevated cholesterol     Care Plan : Fall Risk (Adult)  Updates made by Riley Churches, RN since 08/23/2020 12:00 AM     Problem: Fall Risk   Priority: High     Long-Range Goal: Absence  of Fall and Fall-Related Injury   Start Date: 07/09/2020  Expected End Date: 01/09/2021  Recent Progress: On track  Priority: High  Note:   Current Barriers:  Knowledge Deficits related to fall precautions in patient with Recurrent major depression, Atherosclerotic cardiovascular disease, Vitamin D deficiency, Memory changes, History of TIA, Fall, initial encounter, Elevated cholesterol   Decreased adherence to prescribed treatment for fall prevention Lacks social connections Clinical Goal(s):  patient will demonstrate improved adherence to prescribed treatment plan for decreasing falls as evidenced by patient reporting and review of EMR patient will verbalize using fall risk reduction strategies discussed patient will not experience additional falls Interventions:  08/23/20 completed outbound successful call with patient  Collaboration with Arnette Felts, FNP regarding development and update of comprehensive plan of care as evidenced by provider attestation and co-signature Inter-disciplinary care team collaboration (see longitudinal plan of care) Evaluation of current treatment plan related to Abnormal gait/falls and patient's adherence to plan as established by provider Determined patient has not experienced falls since our last contact, she remains to be ambulatory without DME Reinforced fall prevention tips, instructed patient to notify PCP of any/all falls  Discussed plans with patient for ongoing care management follow up and provided patient with direct contact information for care management team Patient Self-Care Activities:  Continue to keep all scheduled follow up appointments Take medications as directed  Let your healthcare team know if you are unable to take your medications Call your pharmacy for refills at least 7 days prior to running out of medication Patient Goals:  - always wear low-heeled or flat shoes or slippers with nonskid soles - keep my cell phone with me  always - learn how to get back up if I fall - make an emergency alert plan in case I fall - pick up clutter from the floors - use a nonslip pad with throw rugs, or remove them completely - use a cane or walker - use a nightlight in the bathroom  Follow Up Plan: Telephone follow up appointment with care management team member scheduled for: 11/05/20    Care Plan : Depression (Adult)  Updates made by Riley Churches, RN since 08/23/2020 12:00 AM     Problem: Symptoms (Depression)   Priority: High     Long-Range Goal: Symptoms Monitored and Managed   Start Date: 07/09/2020  Expected End Date: 07/09/2021  Recent Progress: On track  Priority: High  Note:   Current Barriers:  Ineffective Self Health Maintenance  Lacks social connections Clinical Goal(s):  Collaboration with Arnette Felts, FNP regarding development and update of comprehensive plan of care as evidenced by provider attestation and co-signature Inter-disciplinary care team collaboration (see longitudinal plan of care) patient will work with care management team to address care coordination and chronic disease management needs related to Disease Management Educational Needs Care Coordination Medication Management and Education Psychosocial Support   Interventions:  08/23/20 completed successful outbound call with patient  Evaluation of current treatment plan related to Depression, self-management and patient's adherence to plan as established by provider. Collaboration with Arnette Felts, FNP regarding development and update of comprehensive plan of care as evidenced by provider attestation  and co-signature Inter-disciplinary care team collaboration (see longitudinal plan of care) Provided education to patient about basic disease process related to Depression Review of patient status, including review of consultants reports, relevant laboratory and other test results, and medications completed. Reviewed medications  with patient and discussed importance of medication adherence Determined patient feels her depression has improved, she is using prayer and medication to help her cope Determined patient has the support of her son and daughter in law who call and check on her and are available to assist as needed  Encouraged patient to keep her PCP aware of changes with her depression including new or worsening symptoms Educated patient about the black box warning for Topamax increasing her risk for new or worsening depression and instructed patient to discuss this with the prescribing MD  Discussed plans with patient for ongoing care management follow up and provided patient with direct contact information for care management team Self Care Activities:  - avoid negative self-talk - develop a plan to deal with triggers like holidays, anniversaries - exercise at least 2 to 3 times per week - have a plan for how to handle bad days - journal feelings and what helps to feel better or worse - spend time or talk with others at least 2 to 3 times per week - watch for early signs of feeling worse Patient Goals: - to be less depressed   Follow Up Plan: Telephone follow up appointment with care management team member scheduled for: 11/05/20     Care Plan : Vitamin D deficiency  Updates made by Riley Churches, RN since 08/23/2020 12:00 AM     Problem: Vitamin D deficiency   Priority: Medium     Long-Range Goal: Vitamin D deficiency - treatment optimized   Start Date: 07/09/2020  Expected End Date: 07/09/2021  Recent Progress: On track  Priority: Medium  Note:   Current Barriers:  Ineffective Self Health Maintenance  Clinical Goal(s):  Collaboration with Arnette Felts, FNP regarding development and update of comprehensive plan of care as evidenced by provider attestation and co-signature Inter-disciplinary care team collaboration (see longitudinal plan of care) patient will work with care management team to  address care coordination and chronic disease management needs related to Disease Management Educational Needs Care Coordination Medication Management and Education Medication Reconciliation Psychosocial Support   Interventions:  08/23/20 completed outbound call with patient  Evaluation of current treatment plan related to  Vitamin D deficiency , self-management and patient's adherence to plan as established by provider. Collaboration with Arnette Felts, FNP regarding development and update of comprehensive plan of care as evidenced by provider attestation       and co-signature Inter-disciplinary care team collaboration (see longitudinal plan of care) Provided education to patient about basic disease process related to Vitamin D deficiency Review of patient status, including review of consultants reports, relevant laboratory and other test results, and medications completed. Reviewed medications with patient and discussed importance of medication adherence Educated patient on dietary recommendations and encouraged patient to get at least 15 minutes of natural sunlight daily when possible Collaborated with PCP provider regarding patient's current Vitamin D level and supplemental dosing, per Arnette Felts FNP patient should increase her Vitamin D dosage to 3000 iu daily, patient made aware  Discussed plans with patient for ongoing care management follow up and provided patient with direct contact information for care management team Self Care Activities:  Continue to keep all scheduled follow up appointments Take medications as directed  Let  your healthcare team know if you are unable to take your medications Call your pharmacy for refills at least 7 days prior to running out of medication Eat Vitamin D rich foods, try to get at least 15 minutes of sunlight daily when able  Review patient educational materials related to Vitamin D deficiency and FAQ Patient Goals: - maintain and or improve  Vitamin D deficiency  Follow Up Plan: Telephone follow up appointment with care management team member scheduled for: 11/05/20     Care Plan : Elevated Cholesterol  Updates made by Riley ChurchesLittle, Sabrina Arriaga L, RN since 08/23/2020 12:00 AM     Problem: Elevated Cholesterol   Priority: High     Long-Range Goal: Elevated Cholesterol - treatment optimized   Start Date: 08/23/2020  Expected End Date: 08/23/2021  This Visit's Progress: On track  Priority: High  Note:   Current Barriers:  Ineffective Self Health Maintenance  Clinical Goal(s):  Collaboration with Arnette FeltsMoore, Janece, FNP regarding development and update of comprehensive plan of care as evidenced by provider attestation and co-signature Inter-disciplinary care team collaboration (see longitudinal plan of care) patient will work with care management team to address care coordination and chronic disease management needs related to Disease Management Educational Needs Care Coordination Medication Management and Education Psychosocial Support   Interventions:  08/23/20 completed successful outbound call with patient  Evaluation of current treatment plan related to  Elevated Cholesterol  , self-management and patient's adherence to plan as established by provider. Collaboration with Arnette FeltsMoore, Janece, FNP regarding development and update of comprehensive plan of care as evidenced by provider attestation       and co-signature Inter-disciplinary care team collaboration (see longitudinal plan of care) Provided education to patient about basic disease process related to elevated Cholesterol  Review of patient status, including review of consultant's reports, relevant laboratory and other test results, and medications completed. Reviewed medications with patient and discussed importance of medication adherence Educated patient on dietary and exercise recommendations Mailed printed educational materials related to My Cholesterol Guide  Discussed plans  with patient for ongoing care management follow up and provided patient with direct contact information for care management team Self Care Activities:  Self administers medications as prescribed Attends all scheduled provider appointments Calls pharmacy for medication refills Calls provider office for new concerns or questions Adhere to dietary and exercise recommendations  Patient Goals: - adhere to dietary and exercise recommendations to lower Cholesterol  Follow Up Plan: Telephone follow up appointment with care management team member scheduled for: 11/05/20     Care Plan : Chronic Kidney (Adult)  Updates made by Riley ChurchesLittle, Prabhjot Piscitello L, RN since 08/23/2020 12:00 AM     Problem: Disease Progression   Priority: Medium     Long-Range Goal: Disease Progression Prevented or Minimized   Start Date: 08/23/2020  Expected End Date: 08/23/2021  This Visit's Progress: On track  Priority: Medium  Note:   Current Barriers:  Ineffective Self Health Maintenance  Clinical Goal(s):  Collaboration with Arnette FeltsMoore, Janece, FNP regarding development and update of comprehensive plan of care as evidenced by provider attestation and co-signature Inter-disciplinary care team collaboration (see longitudinal plan of care) patient will work with care management team to address care coordination and chronic disease management needs related to Disease Management Educational Needs Care Coordination Medication Management and Education Psychosocial Support   Interventions: 08/23/20 completed successful outbound call with patient   Evaluation of current treatment plan related to CKD Stage III , self-management and patient's adherence to plan as  established by provider. Collaboration with Arnette Felts, FNP regarding development and update of comprehensive plan of care as evidenced by provider attestation       and co-signature Inter-disciplinary care team collaboration (see longitudinal plan of care) Provided education  to patient about basic disease process related to stage III Chronic Kidney disease  Review of patient status, including review of consultant's reports, relevant laboratory and other test results, and medications completed. Reviewed medications with patient and discussed importance of medication adherence Educated on the importance of increasing water intake to 64 oz daily; Educated on the potential SE for kidney stones secondary to taking Topamax Geophysicist/field seismologist related to Stages of Chronic Kidney disease   Discussed plans with patient for ongoing care management follow up and provided patient with direct contact information for care management team Self-Care Activities:  Continue to adhere to MD recommendations for CKD  Continue to keep all scheduled follow up appointments Take medications as directed  Let your healthcare team know if you are unable to take your medications Call your pharmacy for refills at least 7 days prior to running out of medication Increase your water intake unless otherwise directed Review mailed printed educational materials related to kidney disease Patient Goals: - maintain and or improve renal function   Follow Up Plan: Telephone follow up appointment with care management team member scheduled for: 11/05/20      Plan:Telephone follow up appointment with care management team member scheduled for:  11/05/20  Delsa Sale, RN, BSN, CCM Care Management Coordinator [facility] Care Management/[facility]  Direct Phone: [phone]

## 2020-08-24 ENCOUNTER — Telehealth: Payer: Self-pay

## 2020-08-24 NOTE — Telephone Encounter (Signed)
-----   Message from Arnette Felts, FNP sent at 08/23/2020  3:44 PM EDT ----- Your vitamin B12 is lower at 446 we gave you a vitamin b12 injection at your visit, if you would like one next month go ahead and schedule.  Your kidney functions are slightly sluggish, be sure to stay well hydrated with water. Liver functions are normal.  Triglycerides slightly up from 164 cut back on breads and sweets.  LDL is 108 avoid fried and fatty foods. Vitamin d is normal. You have been exposed to chicken pox and have an immunity, if you would like the shingles vaccine we can send a Rx to the pharmacy.

## 2020-08-24 NOTE — Telephone Encounter (Signed)
Left the patient a message to call back for lab results. 

## 2020-08-27 ENCOUNTER — Telehealth: Payer: Medicare Other

## 2020-08-27 ENCOUNTER — Ambulatory Visit: Payer: Self-pay

## 2020-08-27 DIAGNOSIS — E7800 Pure hypercholesterolemia, unspecified: Secondary | ICD-10-CM | POA: Diagnosis not present

## 2020-08-27 DIAGNOSIS — I2510 Atherosclerotic heart disease of native coronary artery without angina pectoris: Secondary | ICD-10-CM | POA: Diagnosis not present

## 2020-08-27 DIAGNOSIS — E559 Vitamin D deficiency, unspecified: Secondary | ICD-10-CM

## 2020-08-27 DIAGNOSIS — F3341 Major depressive disorder, recurrent, in partial remission: Secondary | ICD-10-CM

## 2020-08-27 NOTE — Chronic Care Management (AMB) (Signed)
Chronic Care Management    Social Work Note  08/27/2020 Name: [name] MRN: [id number] DOB: [birth year]  [name] is a 80 y.o. year old female who is a primary care patient of Mary Felts, FNP. The CCM team was consulted to assist the patient with chronic disease management and/or care coordination needs related to: [name] .    Third unsuccessful telephone outreach was attempted today. The patient was referred to the case management team for assistance with care management and care coordination. The patient's primary care provider has been notified of our unsuccessful attempts to make or maintain contact with the patient. The care management team is pleased to engage with this patient at any time in the future should he/she be interested in assistance from the care management team.    CCM Care Plan  Allergies  Allergen Reactions   Aspirin Anaphylaxis and Hives   Elavil [Amitriptyline] Anaphylaxis   Latex    Relafen [Nabumetone] Hives    Outpatient Encounter Medications as of 08/27/2020  Medication Sig   acetaminophen (TYLENOL) 500 MG tablet Take 1,000 mg by mouth every 6 (six) hours as needed.   Boswellia-Glucosamine-Vit D (OSTEO BI-FLEX ONE PER DAY PO) Take by mouth.   Calcium Carbonate-Vit D-Min (CALCIUM 1200 PO) Take 1 capsule by mouth daily.   cetirizine (ZYRTEC) 10 MG tablet Take 10 mg by mouth daily.   Cholecalciferol (VITAMIN D) 2000 units tablet Take 2,000 Units by mouth daily.   clopidogrel (PLAVIX) 75 MG tablet TAKE 1 TABLET BY MOUTH EVERY DAY   fluticasone (FLONASE) 50 MCG/ACT nasal spray Place into both nostrils daily as needed for allergies or rhinitis.   Omega-3 Fatty Acids (FISH OIL PO) Take 1 capsule by mouth daily. Takes it sometimes   Polyethyl Glycol-Propyl Glycol (SYSTANE OP) Apply to eye. As needed   pregabalin (LYRICA) 25 MG capsule Take 1 capsule 3 times daily   sertraline (ZOLOFT) 50 MG tablet TAKE 1 TABLET BY MOUTH EVERY DAY    topiramate (TOPAMAX) 25 MG tablet Take one tablet in the morning and 3 in the evening   vitamin C (ASCORBIC ACID) 250 MG tablet Take 250 mg by mouth daily.    No facility-administered encounter medications on file as of 08/27/2020.    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 10/26/2019   Intermediate stage nonexudative age-related macular degeneration of both eyes 10/26/2019   Vitamin D deficiency 06/28/2019   Abnormal glucose 10/21/2018   History of TIA (transient ischemic attack) 06/17/2018   Recurrent major depressive disorder, in partial remission (HCC) 06/17/2018   Seasonal allergies 06/17/2018   Depression 02/11/2018   Hiatal hernia 02/11/2018   Dysphagia 02/11/2018   Hyperkalemia 02/11/2018   Abnormality of gait 04/10/2015   Cervical spondylosis without myelopathy 07/18/2013   Left-sided weakness 05/29/2013   Left sided numbness 05/29/2013   Headache 05/29/2013   TIA (transient ischemic attack) 05/29/2013   Neck pain on left side 05/29/2013   Fibromyalgia     Conditions to be addressed/monitored:  Atherosclerosis Cardiovascular Disease, Recurrent Major Depression, and Vitamin D Deficiency  Care Plan : Social Work [name] Plan  Updates made by [name] since 08/27/2020 12:00 AM  Completed 08/27/2020   Problem: Barriers to Treatment Resolved 08/27/2020     Long-Range Goal: Barriers  to Treatment Identified and Managed Completed 08/27/2020  Start Date: 06/18/2020  Expected End Date: 10/16/2020  Priority: Medium  Note:   Current Barriers:  Chronic disease management support and education needs related to  Recurrent Major Depression, Atherosclerosis Cardiovascular Disease, Vitamin D Deficiency   Financial constraints related to costs of  living  Child psychotherapist Clinical Goal(s):  Over the next 120 days, patient will work with SW to identify and address any acute and/or chronic care coordination needs related to the self health management of  Recurrent Major Depression, Atherosclerosis Cardiovascular Disease, Vitamin D Deficiency   Over the next 90 days the patient will work with SW to become more knowledgeable of community resources  6.20.22- Goal closed due to inability to maintain patient contact  CCM SW Interventions:  Inter-disciplinary care team collaboration (see longitudinal plan of care) Collaboration with Mary Felts, FNP regarding development and update of comprehensive plan of care as evidenced by provider attestation and co-signature Successful outbound call placed to the patient to assess for [facility] needs Determined the patient has difficulty paying to heat her home - the patient denies being behind on bills at this time Provided verbal and written education about LIEAP (low Income Energy Assistance Program) Discussed the patient sometimes worries she may run out of food - when asked if the patient has ever ran out of food the patient stated "I always find a way" Provided verbal and written information on Out of the [employer] Advised the patient SW would contact her over the next month to confirm receipt of mailed resources Discussed the patient was referred to care management for depression Determined the patient recently lost a past foster child within the past few months who was 79 years old. The patient also reports her husband passed away in Jul 16, 2011 and her mother passed away in 07/15/12 Assessed for patients interest in resources for grief support - the patient declined stating "I have done that before and I know what to do" Discussed follow up with SW while patient engages with  RN Case Manager  to address care management needs Collaboration with RN Care Manager regarding interventions and plan Scheduled  follow up call over the next 30 days  Patient Goals/Self-Care Activities Over the next 30 days, patient will:   - Patient will self administer medications as prescribed Patient will attend all scheduled provider appointments Review mailed resources prior to next scheduled call  Follow Up Plan:  SW will follow up with the patient over the next month       Follow Up Plan:  No SW follow up planned at this time due to inability to maintain patient contact. Patient will remain engaged with RN Care Manager to address care management needs.      [name], BSW, CDP Social Worker, Certified Dementia Practitioner [facility] Care Management [phone]  Total time spent performing care coordination and/or care management activities with the patient by phone or face to face = 15 minutes.

## 2020-09-04 ENCOUNTER — Other Ambulatory Visit: Payer: Self-pay

## 2020-09-04 ENCOUNTER — Ambulatory Visit (INDEPENDENT_AMBULATORY_CARE_PROVIDER_SITE_OTHER): Payer: Medicare Other

## 2020-09-04 DIAGNOSIS — Z23 Encounter for immunization: Secondary | ICD-10-CM | POA: Diagnosis not present

## 2020-09-04 NOTE — Progress Notes (Signed)
   Covid-19 Vaccination Clinic  Name:  [name]    MRN: [id number] DOB: 1940/03/18  09/04/2020  Ms. Vidaurri was observed post Covid-19 immunization for 15 minutes without incident. She was provided with Vaccine Information Sheet and instruction to access the V-Safe system.   Ms. Duan was instructed to call 911 with any severe reactions post vaccine: Difficulty breathing  Swelling of face and throat  A fast heartbeat  A bad rash all over body  Dizziness and weakness   Immunizations Administered     Name Date Dose VIS Date Route   Moderna Covid-19 Booster Vaccine 09/04/2020  3:13 PM 0.25 mL 12/28/2019 Intramuscular   Manufacturer: Moderna   Lot: [id number]   NDC: 41962-229-79

## 2020-09-27 ENCOUNTER — Ambulatory Visit (INDEPENDENT_AMBULATORY_CARE_PROVIDER_SITE_OTHER): Payer: Medicare Other

## 2020-09-27 VITALS — Ht 64.0 in | Wt 150.0 lb

## 2020-09-27 DIAGNOSIS — Z0000 Encounter for general adult medical examination without abnormal findings: Secondary | ICD-10-CM

## 2020-09-27 NOTE — Patient Instructions (Signed)
[name] , Thank you for taking time to come for your Medicare Wellness Visit. I appreciate your ongoing commitment to your health goals. Please review the following plan we discussed and let me know if I can assist you in the future.   Screening recommendations/referrals: Colonoscopy: not required Mammogram: completed 05/21/2020 Bone Density: completed 03/29/2015 Recommended yearly ophthalmology/optometry visit for glaucoma screening and checkup Recommended yearly dental visit for hygiene and checkup  Vaccinations: Influenza vaccine: due 10/08/2020 Pneumococcal vaccine: completed 10/28/2019 Tdap vaccine: completed 03/22/2013, due 03/23/2023 Shingles vaccine: decline   Covid-19:09/04/2020, 07/25/2019, 06/27/2019  Advanced directives: Please bring a copy of your POA (Power of Attorney) and/or Living Will to your next appointment.   Conditions/risks identified: none  Next appointment: Follow up in one year for your annual wellness visit    Preventive Care 65 Years and Older, Female Preventive care refers to lifestyle choices and visits with your health care provider that can promote health and wellness. What does preventive care include? A yearly physical exam. This is also called an annual well check. Dental exams once or twice a year. Routine eye exams. Ask your health care provider how often you should have your eyes checked. Personal lifestyle choices, including: Daily care of your teeth and gums. Regular physical activity. Eating a healthy diet. Avoiding tobacco and drug use. Limiting alcohol use. Practicing safe sex. Taking low-dose aspirin every day. Taking vitamin and mineral supplements as recommended by your health care provider. What happens during an annual well check? The services and screenings done by your health care provider during your annual well check will depend on your age, overall health, lifestyle risk factors, and family history of disease. Counseling  Your  health care provider may ask you questions about your: Alcohol use. Tobacco use. Drug use. Emotional well-being. Home and relationship well-being. Sexual activity. Eating habits. History of falls. Memory and ability to understand (cognition). Work and work Astronomer. Reproductive health. Screening  You may have the following tests or measurements: Height, weight, and BMI. Blood pressure. Lipid and cholesterol levels. These may be checked every 5 years, or more frequently if you are over 37 years old. Skin check. Lung cancer screening. You may have this screening every year starting at age 23 if you have a 30-pack-year history of smoking and currently smoke or have quit within the past 15 years. Fecal occult blood test (FOBT) of the stool. You may have this test every year starting at age 78. Flexible sigmoidoscopy or colonoscopy. You may have a sigmoidoscopy every 5 years or a colonoscopy every 10 years starting at age 22. Hepatitis C blood test. Hepatitis B blood test. Sexually transmitted disease (STD) testing. Diabetes screening. This is done by checking your blood sugar (glucose) after you have not eaten for a while (fasting). You may have this done every 1-3 years. Bone density scan. This is done to screen for osteoporosis. You may have this done starting at age 89. Mammogram. This may be done every 1-2 years. Talk to your health care provider about how often you should have regular mammograms. Talk with your health care provider about your test results, treatment options, and if necessary, the need for more tests. Vaccines  Your health care provider may recommend certain vaccines, such as: Influenza vaccine. This is recommended every year. Tetanus, diphtheria, and acellular pertussis (Tdap, Td) vaccine. You may need a Td booster every 10 years. Zoster vaccine. You may need this after age 58. Pneumococcal 13-valent conjugate (PCV13) vaccine. One dose is  recommended after age  76. Pneumococcal polysaccharide (PPSV23) vaccine. One dose is recommended after age 10. Talk to your health care provider about which screenings and vaccines you need and how often you need them. This information is not intended to replace advice given to you by your health care provider. Make sure you discuss any questions you have with your health care provider. Document Released: 03/23/2015 Document Revised: 11/14/2015 Document Reviewed: 12/26/2014 Elsevier Interactive Patient Education  2017 [employer] [employer] Prevention in the Home Falls can cause injuries. They can happen to people of all ages. There are many things you can do to make your home safe and to help prevent falls. What can I do on the outside of my home? Regularly fix the edges of walkways and driveways and fix any cracks. Remove anything that might make you trip as you walk through a door, such as a raised step or threshold. Trim any bushes or trees on the path to your home. Use bright outdoor lighting. Clear any walking paths of anything that might make someone trip, such as rocks or tools. Regularly check to see if handrails are loose or broken. Make sure that both sides of any steps have handrails. Any raised decks and porches should have guardrails on the edges. Have any leaves, snow, or ice cleared regularly. Use sand or salt on walking paths during winter. Clean up any spills in your garage right away. This includes oil or grease spills. What can I do in the bathroom? Use night lights. Install grab bars by the toilet and in the tub and shower. Do not use towel bars as grab bars. Use non-skid mats or decals in the tub or shower. If you need to sit down in the shower, use a plastic, non-slip stool. Keep the floor dry. Clean up any water that spills on the floor as soon as it happens. Remove soap buildup in the tub or shower regularly. Attach bath mats securely with double-sided non-slip rug tape. Do not have throw  rugs and other things on the floor that can make you trip. What can I do in the bedroom? Use night lights. Make sure that you have a light by your bed that is easy to reach. Do not use any sheets or blankets that are too big for your bed. They should not hang down onto the floor. Have a firm chair that has side arms. You can use this for support while you get dressed. Do not have throw rugs and other things on the floor that can make you trip. What can I do in the kitchen? Clean up any spills right away. Avoid walking on wet floors. Keep items that you use a lot in easy-to-reach places. If you need to reach something above you, use a strong step stool that has a grab bar. Keep electrical cords out of the way. Do not use floor polish or wax that makes floors slippery. If you must use wax, use non-skid floor wax. Do not have throw rugs and other things on the floor that can make you trip. What can I do with my stairs? Do not leave any items on the stairs. Make sure that there are handrails on both sides of the stairs and use them. Fix handrails that are broken or loose. Make sure that handrails are as long as the stairways. Check any carpeting to make sure that it is firmly attached to the stairs. Fix any carpet that is loose or worn. Avoid having  throw rugs at the top or bottom of the stairs. If you do have throw rugs, attach them to the floor with carpet tape. Make sure that you have a light switch at the top of the stairs and the bottom of the stairs. If you do not have them, ask someone to add them for you. What else can I do to help prevent falls? Wear shoes that: Do not have high heels. Have rubber bottoms. Are comfortable and fit you well. Are closed at the toe. Do not wear sandals. If you use a stepladder: Make sure that it is fully opened. Do not climb a closed stepladder. Make sure that both sides of the stepladder are locked into place. Ask someone to hold it for you, if  possible. Clearly mark and make sure that you can see: Any grab bars or handrails. First and last steps. Where the edge of each step is. Use tools that help you move around (mobility aids) if they are needed. These include: Canes. Walkers. Scooters. Crutches. Turn on the lights when you go into a dark area. Replace any light bulbs as soon as they burn out. Set up your furniture so you have a clear path. Avoid moving your furniture around. If any of your floors are uneven, fix them. If there are any pets around you, be aware of where they are. Review your medicines with your doctor. Some medicines can make you feel dizzy. This can increase your chance of falling. Ask your doctor what other things that you can do to help prevent falls. This information is not intended to replace advice given to you by your health care provider. Make sure you discuss any questions you have with your health care provider. Document Released: 12/21/2008 Document Revised: 08/02/2015 Document Reviewed: 03/31/2014 Elsevier Interactive Patient Education  2017 [employer].

## 2020-09-27 NOTE — Progress Notes (Signed)
I connected with [name] today by telephone and verified that I am speaking with the correct person using two identifiers. Location patient: home Location provider: work Persons participating in the virtual visit: [name], Elisha Ponder LPN.   I discussed the limitations, risks, security and privacy concerns of performing an evaluation and management service by telephone and the availability of in person appointments. I also discussed with the patient that there may be a patient responsible charge related to this service. The patient expressed understanding and verbally consented to this telephonic visit.    Interactive audio and video telecommunications were attempted between this provider and patient, however failed, due to patient having technical difficulties OR patient did not have access to video capability.  We continued and completed visit with audio only.     Vital signs may be patient reported or missing.  Subjective:   [name] is a 80 y.o. female who presents for Medicare Annual (Subsequent) preventive examination.  Review of Systems     Cardiac Risk Factors include: advanced age (>38men, >64 women);sedentary lifestyle     Objective:    Today's Vitals   09/27/20 1026  Weight: 150 lb (68 kg)  Height: 5\' 4"  (1.626 m)   Body mass index is 25.75 kg/m.  Advanced Directives 09/27/2020 06/18/2020 08/24/2018 04/10/2015 11/20/2014 05/18/2014 05/29/2013  Does Patient Have a Medical Advance Directive? Yes Yes Yes Yes No No Patient does not have advance directive  Type of Advance Directive Healthcare Power of [place];Living will Healthcare Power of [place] Power of [employer] Power of [place];Living will - - -  Does patient want to make changes to medical advance directive? - No - Patient declined - - - - -  Copy of Healthcare Power of Attorney in Chart? No - copy requested - No - copy requested - - - -  Would patient like information on  creating a medical advance directive? - - - - - Yes - Educational materials given -  Pre-existing out of facility DNR order (yellow form or pink MOST form) - - - - - - No    Current Medications (verified) Outpatient Encounter Medications as of 09/27/2020  Medication Sig   acetaminophen (TYLENOL) 500 MG tablet Take 1,000 mg by mouth every 6 (six) hours as needed.   Calcium Carbonate-Vit D-Min (CALCIUM 1200 PO) Take 1 capsule by mouth daily.   Cholecalciferol (VITAMIN D) 2000 units tablet Take 2,000 Units by mouth daily.   clopidogrel (PLAVIX) 75 MG tablet TAKE 1 TABLET BY MOUTH EVERY DAY   fluticasone (FLONASE) 50 MCG/ACT nasal spray Place into both nostrils daily as needed for allergies or rhinitis.   Omega-3 Fatty Acids (FISH OIL PO) Take 1 capsule by mouth daily. Takes it sometimes   Polyethyl Glycol-Propyl Glycol (SYSTANE OP) Apply to eye. As needed   pregabalin (LYRICA) 25 MG capsule Take 1 capsule 3 times daily   sertraline (ZOLOFT) 50 MG tablet TAKE 1 TABLET BY MOUTH EVERY DAY   topiramate (TOPAMAX) 25 MG tablet Take one tablet in the morning and 3 in the evening   vitamin C (ASCORBIC ACID) 250 MG tablet Take 250 mg by mouth daily.    Boswellia-Glucosamine-Vit D (OSTEO BI-FLEX ONE PER DAY PO) Take by mouth. (Patient not taking: Reported on 09/27/2020)   cetirizine (ZYRTEC) 10 MG tablet Take 10 mg by mouth daily. (Patient not taking: Reported on 09/27/2020)   No facility-administered encounter medications on file as of 09/27/2020.    Allergies (verified) Aspirin,  Elavil [amitriptyline], Latex, and Relafen [nabumetone]   History: Past Medical History:  Diagnosis Date   Abnormality of gait 04/10/2015   Cervical spondylosis without myelopathy 07/18/2013   Common migraine with intractable migraine 04/10/2015   DJD (degenerative joint disease)    Fibromyalgia    Hiatal hernia    Migraine headache    Vitreomacular adhesion of right eye 03/08/2020   Past Surgical History:  Procedure  Laterality Date   ABDOMINAL HYSTERECTOMY     DILATION AND CURETTAGE OF UTERUS     HIATAL HERNIA REPAIR     TONSILLECTOMY     Family History  Problem Relation Age of Onset   CVA Mother    Stroke Father    Emphysema Father    Social History   Socioeconomic History   Marital status: Widowed    Spouse name: Not on file   Number of children: 5   Years of education: college   Highest education level: Not on file  Occupational History   Occupation: Retired  Tobacco Use   Smoking status: Never   Smokeless tobacco: Never  Vaping Use   Vaping Use: Never used  Substance and Sexual Activity   Alcohol use: No    Comment: very occasional   Drug use: No   Sexual activity: Not Currently  Other Topics Concern   Not on file  Social History Narrative   Patient is married with 5 children.   Patient is right handed.   Patient has college education.   Patient drinks 3 cups daily.         Social Determinants of Health   Financial Resource Strain: Low Risk    Difficulty of Paying Living Expenses: Not hard at all  Food Insecurity: Food Insecurity Present   Worried About Programme researcher, broadcasting/film/videounning Out of Food in the Last Year: Sometimes true   Baristaan Out of Food in the Last Year: Sometimes true  Transportation Needs: No Transportation Needs   Lack of Transportation (Medical): No   Lack of Transportation (Non-Medical): No  Physical Activity: Inactive   Days of Exercise per Week: 0 days   Minutes of Exercise per Session: 0 min  Stress: No Stress Concern Present   Feeling of Stress : Not at all  Social Connections: Not on file    Tobacco Counseling Counseling given: Not Answered   Clinical Intake:  Pre-visit preparation completed: Yes  Pain : No/denies pain     Nutritional Status: BMI 25 -29 Overweight Nutritional Risks: Nausea/ vomitting/ diarrhea (diarrhea with drinking milk and eating beef) Diabetes: No  How often do you need to have someone help you when you read instructions, pamphlets,  or other written materials from your doctor or pharmacy?: 1 - Never What is the last grade level you completed in school?: college  Diabetic? no  Interpreter Needed?: No  Information entered by :: NAllen LPN   Activities of Daily Living In your present state of health, do you have any difficulty performing the following activities: 09/27/2020 10/27/2019  Hearing? N Y  Comment has hearing aides, but does not use -  Vision? Y N  Comment small print -  Difficulty concentrating or making decisions? N N  Walking or climbing stairs? Y N  Comment sometimes -  Dressing or bathing? N N  Doing errands, shopping? N N  Preparing Food and eating ? N -  Using the Toilet? N -  In the past six months, have you accidently leaked urine? Y -  Do you have problems  with loss of bowel control? Y -  Managing your Medications? N -  Managing your Finances? N -  Housekeeping or managing your Housekeeping? N -  Some recent data might be hidden    Patient Care Team: Arnette Felts, FNP as PCP - General (General Practice) Little, Karma Lew, RN as [facility] Care Management  Indicate any recent Medical Services you may have received from other than [facility] providers in the past year (date may be approximate).     Assessment:   This is a routine wellness examination for [place].  Hearing/Vision screen Vision Screening - Comments:: Regular eye exams, Dr. Luciana Axe  Dietary issues and exercise activities discussed: Current Exercise Habits: The patient does not participate in regular exercise at present   Goals Addressed             This Visit's Progress    Patient Stated       09/27/2020, lose some weight       Depression Screen PHQ 2/9 Scores 09/27/2020 08/22/2020 05/01/2020 10/27/2019 06/28/2019 06/28/2019 02/24/2019  PHQ - 2 Score 0 0 1 0 0 0 0  PHQ- 9 Score - 0 5 0 0 - 1    Fall Risk Fall Risk  09/27/2020 08/22/2020 06/28/2019 02/24/2019 01/31/2019  Falls in the past year? 0 0 0 0 0   Number falls in past yr: - 0 - - -  Injury with Fall? - 1 - - -  Risk for fall due to : Medication side effect No Fall Risks - - -  Follow up Falls evaluation completed;Education provided;Falls prevention discussed Falls evaluation completed - - -    FALL RISK PREVENTION PERTAINING TO THE HOME:  Any stairs in or around the home? Yes  If so, are there any without handrails? Yes  Home free of loose throw rugs in walkways, pet beds, electrical cords, etc? Yes  Adequate lighting in your home to reduce risk of falls? No keeps it dim  ASSISTIVE DEVICES UTILIZED TO PREVENT FALLS:  Life alert? No  Use of a cane, walker or w/c? Yes  Grab bars in the bathroom? Yes  Shower chair or bench in shower? Yes  Elevated toilet seat or a handicapped toilet? Yes   TIMED UP AND GO:  Was the test performed? No .  .     Cognitive Function: MMSE - Mini Mental State Exam 05/01/2020  Orientation to time 5  Orientation to Place 5  Registration 3  Attention/ Calculation 5  Recall 3  Language- name 2 objects 2  Language- repeat 1  Language- follow 3 step command 3  Language- read & follow direction 1  Write a sentence 1  Copy design 1  Total score 30     6CIT Screen 09/27/2020 05/01/2020 08/24/2018  What Year? 0 points 0 points 0 points  What month? 0 points 0 points 0 points  What time? 0 points 3 points 0 points  Count back from 20 0 points 0 points 0 points  Months in reverse 0 points 0 points 0 points  Repeat phrase 4 points 0 points 0 points  Total Score 4 3 0    Immunizations Immunization History  Administered Date(s) Administered   Influenza, High Dose Seasonal PF 02/24/2019   Influenza-Unspecified 01/06/2018   Moderna SARS-COV2 Booster Vaccination 09/04/2020   Moderna Sars-Covid-2 Vaccination 06/27/2019, 07/25/2019   Pneumococcal Polysaccharide-23 10/21/2018, 10/28/2019   Tdap 03/22/2013    TDAP status: Up to date  Flu Vaccine status: Up to date  Pneumococcal vaccine  status: Up to date  Covid-19 vaccine status: Completed vaccines  Qualifies for Shingles Vaccine? Yes   Zostavax completed No   Shingrix Completed?: No.    Education has been provided regarding the importance of this vaccine. Patient has been advised to call insurance company to determine out of pocket expense if they have not yet received this vaccine. Advised may also receive vaccine at local pharmacy or Health Dept. Verbalized acceptance and understanding.  Screening Tests Health Maintenance  Topic Date Due   Zoster Vaccines- Shingrix (1 of 2) 12/28/2020 (Originally [birth year]/18/1991)   INFLUENZA VACCINE  10/08/2020   PNA vac Low Risk Adult (2 of 2 - PCV13) 10/27/2020   COVID-19 Vaccine (4 - Booster for Moderna series) 01/04/2021   MAMMOGRAM  05/21/2021   TETANUS/TDAP  03/23/2023   DEXA SCAN  Completed   Hepatitis C Screening  Completed   HPV VACCINES  Aged Out    Health Maintenance  There are no preventive care reminders to display for this patient.   Colorectal cancer screening: No longer required.   Mammogram status: Completed 05/21/2020. Repeat every year  Bone Density status: Completed 03/29/2015.   Lung Cancer Screening: (Low Dose CT Chest recommended if Age 59-80 years, 30 pack-year currently smoking OR have quit w/in 15years.) does not qualify.   Lung Cancer Screening Referral: no  Additional Screening:  Hepatitis C Screening: does qualify; Completed 10/27/2019  Vision Screening: Recommended annual ophthalmology exams for early detection of glaucoma and other disorders of the eye. Is the patient up to date with their annual eye exam?  Yes  Who is the provider or what is the name of the office in which the patient attends annual eye exams? Dr. Luciana Axe If pt is not established with a provider, would they like to be referred to a provider to establish care? No .   Dental Screening: Recommended annual dental exams for proper oral hygiene  Community Resource Referral /  Chronic Care Management: CRR required this visit?  No   CCM required this visit?  No      Plan:     I have personally reviewed and noted the following in the patient's chart:   Medical and social history Use of alcohol, tobacco or illicit drugs  Current medications and supplements including opioid prescriptions.  Functional ability and status Nutritional status Physical activity Advanced directives List of other physicians Hospitalizations, surgeries, and ER visits in previous 12 months Vitals Screenings to include cognitive, depression, and falls Referrals and appointments  In addition, I have reviewed and discussed with patient certain preventive protocols, quality metrics, and best practice recommendations. A written personalized care plan for preventive services as well as general preventive health recommendations were provided to patient.     Barb Merino, LPN   [id number]   Nurse Notes:

## 2020-10-01 ENCOUNTER — Other Ambulatory Visit: Payer: Self-pay

## 2020-10-01 ENCOUNTER — Encounter (INDEPENDENT_AMBULATORY_CARE_PROVIDER_SITE_OTHER): Payer: Self-pay | Admitting: Ophthalmology

## 2020-10-01 ENCOUNTER — Ambulatory Visit (INDEPENDENT_AMBULATORY_CARE_PROVIDER_SITE_OTHER): Payer: Medicare Other | Admitting: Ophthalmology

## 2020-10-01 DIAGNOSIS — H353132 Nonexudative age-related macular degeneration, bilateral, intermediate dry stage: Secondary | ICD-10-CM

## 2020-10-01 DIAGNOSIS — H353211 Exudative age-related macular degeneration, right eye, with active choroidal neovascularization: Secondary | ICD-10-CM

## 2020-10-01 DIAGNOSIS — H35721 Serous detachment of retinal pigment epithelium, right eye: Secondary | ICD-10-CM

## 2020-10-01 MED ORDER — BEVACIZUMAB 2.5 MG/0.1ML IZ SOSY
2.5000 mg | PREFILLED_SYRINGE | INTRAVITREAL | Status: AC | PRN
  Administered 2020-10-01: 2.5 mg via INTRAVITREAL

## 2020-10-01 NOTE — Assessment & Plan Note (Signed)
Less subretinal fluid associated OD today

## 2020-10-01 NOTE — Assessment & Plan Note (Signed)

## 2020-10-01 NOTE — Assessment & Plan Note (Signed)
Much less disease activity with less subretinal fluid subfoveal location as well as improved acuity.

## 2020-10-01 NOTE — Progress Notes (Signed)
10/01/2020     CHIEF COMPLAINT Patient presents for Retina Follow Up (6 week fu OD and Avastin OD/Pt states VA OU stable since last visit. Pt denies FOL, floaters, or ocular pain OU. /)   HISTORY OF PRESENT ILLNESS: [name] is a 80 y.o. female who presents to the clinic today for:   HPI     Retina Follow Up           Diagnosis: Wet AMD   Laterality: right eye   Onset: 6 weeks ago   Severity: mild   Duration: 6 weeks   Course: stable   Comments: 6 week fu OD and Avastin OD Pt states VA OU stable since last visit. Pt denies FOL, floaters, or ocular pain OU.         Last edited by Demetrios Loll, COA on 10/01/2020  1:58 PM.      Referring physician: Arnette Felts, FNP [address] [place],  [place] [address]  HISTORICAL INFORMATION:   Selected notes from the MEDICAL RECORD NUMBER    Lab Results  Component Value Date   HGBA1C 5.5 10/27/2019     CURRENT MEDICATIONS: Current Outpatient Medications (Ophthalmic Drugs)  Medication Sig   Polyethyl Glycol-Propyl Glycol (SYSTANE OP) Apply to eye. As needed   No current facility-administered medications for this visit. (Ophthalmic Drugs)   Current Outpatient Medications (Other)  Medication Sig   acetaminophen (TYLENOL) 500 MG tablet Take 1,000 mg by mouth every 6 (six) hours as needed.   Boswellia-Glucosamine-Vit D (OSTEO BI-FLEX ONE PER DAY PO) Take by mouth. (Patient not taking: Reported on 09/27/2020)   Calcium Carbonate-Vit D-Min (CALCIUM 1200 PO) Take 1 capsule by mouth daily.   cetirizine (ZYRTEC) 10 MG tablet Take 10 mg by mouth daily. (Patient not taking: Reported on 09/27/2020)   Cholecalciferol (VITAMIN D) 2000 units tablet Take 2,000 Units by mouth daily.   clopidogrel (PLAVIX) 75 MG tablet TAKE 1 TABLET BY MOUTH EVERY DAY   fluticasone (FLONASE) 50 MCG/ACT nasal spray Place into both nostrils daily as needed for allergies or rhinitis.   Omega-3 Fatty Acids (FISH OIL PO) Take 1 capsule by  mouth daily. Takes it sometimes   pregabalin (LYRICA) 25 MG capsule Take 1 capsule 3 times daily   sertraline (ZOLOFT) 50 MG tablet TAKE 1 TABLET BY MOUTH EVERY DAY   topiramate (TOPAMAX) 25 MG tablet Take one tablet in the morning and 3 in the evening   vitamin C (ASCORBIC ACID) 250 MG tablet Take 250 mg by mouth daily.    No current facility-administered medications for this visit. (Other)      REVIEW OF SYSTEMS:    ALLERGIES Allergies  Allergen Reactions   Aspirin Anaphylaxis and Hives   Elavil [Amitriptyline] Anaphylaxis   Latex    Relafen [Nabumetone] Hives    PAST MEDICAL HISTORY Past Medical History:  Diagnosis Date   Abnormality of gait 04/10/2015   Cervical spondylosis without myelopathy 07/18/2013   Common migraine with intractable migraine 04/10/2015   DJD (degenerative joint disease)    Fibromyalgia    Hiatal hernia    Migraine headache    Vitreomacular adhesion of right eye 03/08/2020   Past Surgical History:  Procedure Laterality Date   ABDOMINAL HYSTERECTOMY     DILATION AND CURETTAGE OF UTERUS     HIATAL HERNIA REPAIR     TONSILLECTOMY      FAMILY HISTORY Family History  Problem Relation Age of Onset   CVA Mother  Stroke Father    Emphysema Father     SOCIAL HISTORY Social History   Tobacco Use   Smoking status: Never   Smokeless tobacco: Never  Vaping Use   Vaping Use: Never used  Substance Use Topics   Alcohol use: No    Comment: very occasional   Drug use: No         OPHTHALMIC EXAM:  Base Eye Exam     Visual Acuity (ETDRS)       Right Left   Dist [place] 20/30 20/40   Dist ph [place] 20/25 -2 20/25    Correction: Glasses         Tonometry (Tonopen, 2:02 PM)       Right Left   Pressure 10 12         Pupils       Pupils Dark Light Shape React APD   Right PERRL 4 3 Round Minimal None   Left PERRL 4 3 Round Minimal None         Visual Fields (Counting fingers)       Left Right    Full Full          Extraocular Movement       Right Left    Full Full         Neuro/Psych     Oriented x3: Yes   Mood/Affect: Normal         Dilation     Right eye: 1.0% Mydriacyl, 2.5% Phenylephrine @ 2:02 PM           Slit Lamp and Fundus Exam     External Exam       Right Left   External Normal Normal         Slit Lamp Exam       Right Left   Lids/Lashes Normal Normal   Conjunctiva/Sclera White and quiet White and quiet   Cornea Clear Clear   Anterior Chamber Deep and quiet Deep and quiet   Iris Round and reactive Round and reactive   Lens 2+ Nuclear sclerosis 2+ Nuclear sclerosis   Anterior Vitreous Normal Normal         Fundus Exam       Right Left   Posterior Vitreous Central vitreous floaters    Disc Normal    C/D Ratio 0.2    Macula less Macular thickening, Smaller Subretinal neovascular membrane, Retinal pigment epithelial mottling and hyperpigmentation subfoveal, Early age related macular degeneration    Vessels Normal    Periphery Normal             IMAGING AND PROCEDURES  Imaging and Procedures for 10/01/20  OCT, Retina - OU - Both Eyes       Right Eye Quality was good. Scan locations included subfoveal. Central Foveal Thickness: 276. Progression has worsened. Findings include vitreous traction, no IRF, abnormal foveal contour, pigment epithelial detachment, subretinal fluid.   Left Eye Quality was good. Scan locations included subfoveal. Central Foveal Thickness: 270. Progression has been stable. Findings include retinal drusen .   Notes Small residual cyst serous subretinal detachment temporal to the fovea much improved though compared to August, OD vastly improved overall.   Apparent posterior vitreous detachment now  OS with incidental posterior vitreous detachment     Intravitreal Injection, Pharmacologic Agent - OD - Right Eye       Time Out 10/01/2020. 2:13 PM. Confirmed correct patient, procedure, site, and patient consented.    Anesthesia Topical anesthesia  was used. Anesthetic medications included Akten 3.5%.   Procedure Preparation included Ofloxacin , Tobramycin 0.3%, 10% betadine to eyelids, 5% betadine to ocular surface. A 30 gauge needle was used.   Injection: 2.5 mg bevacizumab 2.5 MG/0.1ML   Route: Intravitreal, Site: Right Eye   NDC: [phone], Lot: [id number]   Post-op Post injection exam found visual acuity of at least counting fingers. The patient tolerated the procedure well. There were no complications. The patient received written and verbal post procedure care education. Post injection medications were not given.              ASSESSMENT/PLAN:  Exudative age-related macular degeneration of right eye with active choroidal neovascularization (HCC) Much less disease activity with less subretinal fluid subfoveal location as well as improved acuity.  Intermediate stage nonexudative age-related macular degeneration of both eyes The nature of age--related macular degeneration was discussed with the patient as well as the distinction between dry and wet types. Checking an Amsler Grid daily with advice to return immediately should a distortion develop, was given to the patient. The patient 's smoking status now and in the past was determined and advice based on the AREDS study was provided regarding the consumption of antioxidant supplements. AREDS 2 vitamin formulation was recommended. Consumption of dark leafy vegetables and fresh fruits of various colors was recommended. Treatment modalities for wet macular degeneration particularly the use of intravitreal injections of anti-blood vessel growth factors was discussed with the patient. Avastin, Lucentis, and Eylea are the available options. On occasion, therapy includes the use of photodynamic therapy and thermal laser. Stressed to the patient do not rub eyes.  Patient was advised to check Amsler Grid daily and return immediately if changes are noted.  Instructions on using the grid were given to the patient. All patient questions were answered.  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 Exudative age-related macular degeneration of right eye with active choroidal neovascularization (HCC)  H35.3211 OCT, Retina - OU - Both Eyes    Intravitreal Injection, Pharmacologic Agent - OD - Right Eye    bevacizumab (AVASTIN) SOSY 2.5 mg    2. Intermediate stage nonexudative age-related macular degeneration of both eyes  H35.3132     3. 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  Vastly improved macular anatomy and stable acuity and slight improvement in the acuity OD currently in 6-week follow-up post antivegF, Avastin.  Much less subretinal fluid overall.  Describes PED associated.  We will repeat injection today and maintain 6-week interval follow-up  2.  Dilate OU next possible Avastin OD next  3.  Ophthalmic Meds Ordered this visit:  Meds ordered this encounter  Medications   bevacizumab (AVASTIN) SOSY 2.5 mg       Return in about 6 weeks (around 11/12/2020) for DILATE OU, AVASTIN OCT, OD.  There are no Patient Instructions on file for this visit.   Explained the diagnoses, plan, and follow up with the patient and they expressed understanding.  Patient expressed understanding of the importance of proper follow up care.   Alford Highland Marv Alfrey M.D. Diseases & Surgery of the Retina and Vitreous [facility] 10/01/20     Abbreviations: M myopia (nearsighted); A astigmatism; H hyperopia (farsighted); P presbyopia; Mrx spectacle prescription;  CTL contact lenses; OD right eye; OS left eye; OU both eyes  XT exotropia; ET esotropia;  PEK punctate epithelial keratitis; PEE punctate epithelial erosions; DES dry eye syndrome; MGD meibomian gland dysfunction; ATs artificial tears; PFAT's preservative free artificial tears; NSC nuclear  sclerotic cataract; PSC posterior subcapsular cataract; ERM epi-retinal membrane; PVD posterior vitreous detachment; RD retinal detachment; DM diabetes mellitus; DR diabetic retinopathy; NPDR non-proliferative diabetic retinopathy; PDR proliferative diabetic retinopathy; CSME clinically significant macular edema; DME diabetic macular edema; dbh dot blot hemorrhages; CWS cotton wool spot; POAG primary open angle glaucoma; C/D cup-to-disc ratio; HVF humphrey visual field; GVF goldmann visual field; OCT optical coherence tomography; IOP intraocular pressure; BRVO Branch retinal vein occlusion; CRVO central retinal vein occlusion; CRAO central retinal artery occlusion; BRAO branch retinal artery occlusion; RT retinal tear; SB scleral buckle; PPV pars plana vitrectomy; VH Vitreous hemorrhage; PRP panretinal laser photocoagulation; IVK intravitreal kenalog; VMT vitreomacular traction; MH Macular hole;  NVD neovascularization of the disc; NVE neovascularization elsewhere; AREDS age related eye disease study; ARMD age related macular degeneration; POAG primary open angle glaucoma; EBMD epithelial/anterior basement membrane dystrophy; ACIOL anterior chamber intraocular lens; IOL intraocular lens; PCIOL posterior chamber intraocular lens; Phaco/IOL phacoemulsification with intraocular lens placement; PRK photorefractive keratectomy; LASIK laser assisted in situ keratomileusis; HTN hypertension; DM diabetes mellitus; COPD chronic obstructive pulmonary disease

## 2020-10-22 ENCOUNTER — Encounter (HOSPITAL_COMMUNITY): Payer: Self-pay

## 2020-10-22 ENCOUNTER — Inpatient Hospital Stay (HOSPITAL_COMMUNITY)
Admission: EM | Admit: 2020-10-22 | Discharge: 2020-10-25 | DRG: 871 | Disposition: A | Discharge status: Home Health | Payer: Medicare Other | Attending: Internal Medicine | Admitting: Internal Medicine

## 2020-10-22 ENCOUNTER — Inpatient Hospital Stay (HOSPITAL_COMMUNITY): Payer: Medicare Other

## 2020-10-22 ENCOUNTER — Other Ambulatory Visit: Payer: Self-pay

## 2020-10-22 ENCOUNTER — Emergency Department (HOSPITAL_COMMUNITY): Payer: Medicare Other

## 2020-10-22 DIAGNOSIS — N12 Tubulo-interstitial nephritis, not specified as acute or chronic: Secondary | ICD-10-CM | POA: Diagnosis not present

## 2020-10-22 DIAGNOSIS — E559 Vitamin D deficiency, unspecified: Secondary | ICD-10-CM | POA: Diagnosis present

## 2020-10-22 DIAGNOSIS — G43909 Migraine, unspecified, not intractable, without status migrainosus: Secondary | ICD-10-CM | POA: Diagnosis present

## 2020-10-22 DIAGNOSIS — R6520 Severe sepsis without septic shock: Secondary | ICD-10-CM | POA: Diagnosis not present

## 2020-10-22 DIAGNOSIS — E872 Acidosis: Secondary | ICD-10-CM | POA: Diagnosis not present

## 2020-10-22 DIAGNOSIS — H353211 Exudative age-related macular degeneration, right eye, with active choroidal neovascularization: Secondary | ICD-10-CM | POA: Diagnosis present

## 2020-10-22 DIAGNOSIS — R29810 Facial weakness: Secondary | ICD-10-CM | POA: Diagnosis not present

## 2020-10-22 DIAGNOSIS — I499 Cardiac arrhythmia, unspecified: Secondary | ICD-10-CM | POA: Diagnosis not present

## 2020-10-22 DIAGNOSIS — Z79899 Other long term (current) drug therapy: Secondary | ICD-10-CM | POA: Diagnosis not present

## 2020-10-22 DIAGNOSIS — F3341 Major depressive disorder, recurrent, in partial remission: Secondary | ICD-10-CM

## 2020-10-22 DIAGNOSIS — Z91040 Latex allergy status: Secondary | ICD-10-CM

## 2020-10-22 DIAGNOSIS — M797 Fibromyalgia: Secondary | ICD-10-CM | POA: Diagnosis not present

## 2020-10-22 DIAGNOSIS — R269 Unspecified abnormalities of gait and mobility: Secondary | ICD-10-CM | POA: Diagnosis present

## 2020-10-22 DIAGNOSIS — I517 Cardiomegaly: Secondary | ICD-10-CM | POA: Diagnosis not present

## 2020-10-22 DIAGNOSIS — A4151 Sepsis due to Escherichia coli [E. coli]: Principal | ICD-10-CM | POA: Diagnosis present

## 2020-10-22 DIAGNOSIS — R6889 Other general symptoms and signs: Secondary | ICD-10-CM | POA: Diagnosis not present

## 2020-10-22 DIAGNOSIS — Z90710 Acquired absence of both cervix and uterus: Secondary | ICD-10-CM | POA: Diagnosis not present

## 2020-10-22 DIAGNOSIS — A419 Sepsis, unspecified organism: Secondary | ICD-10-CM | POA: Diagnosis present

## 2020-10-22 DIAGNOSIS — W06XXXA Fall from bed, initial encounter: Secondary | ICD-10-CM | POA: Diagnosis present

## 2020-10-22 DIAGNOSIS — M47812 Spondylosis without myelopathy or radiculopathy, cervical region: Secondary | ICD-10-CM | POA: Diagnosis present

## 2020-10-22 DIAGNOSIS — N390 Urinary tract infection, site not specified: Secondary | ICD-10-CM | POA: Diagnosis not present

## 2020-10-22 DIAGNOSIS — Z886 Allergy status to analgesic agent status: Secondary | ICD-10-CM | POA: Diagnosis not present

## 2020-10-22 DIAGNOSIS — Z888 Allergy status to other drugs, medicaments and biological substances status: Secondary | ICD-10-CM

## 2020-10-22 DIAGNOSIS — R471 Dysarthria and anarthria: Secondary | ICD-10-CM | POA: Diagnosis not present

## 2020-10-22 DIAGNOSIS — F32A Depression, unspecified: Secondary | ICD-10-CM | POA: Diagnosis not present

## 2020-10-22 DIAGNOSIS — R404 Transient alteration of awareness: Secondary | ICD-10-CM | POA: Diagnosis not present

## 2020-10-22 DIAGNOSIS — Z823 Family history of stroke: Secondary | ICD-10-CM | POA: Diagnosis not present

## 2020-10-22 DIAGNOSIS — D649 Anemia, unspecified: Secondary | ICD-10-CM | POA: Diagnosis not present

## 2020-10-22 DIAGNOSIS — Z743 Need for continuous supervision: Secondary | ICD-10-CM | POA: Diagnosis not present

## 2020-10-22 DIAGNOSIS — Z7902 Long term (current) use of antithrombotics/antiplatelets: Secondary | ICD-10-CM | POA: Diagnosis not present

## 2020-10-22 DIAGNOSIS — Z20822 Contact with and (suspected) exposure to covid-19: Secondary | ICD-10-CM | POA: Diagnosis present

## 2020-10-22 DIAGNOSIS — D6959 Other secondary thrombocytopenia: Secondary | ICD-10-CM | POA: Diagnosis not present

## 2020-10-22 DIAGNOSIS — E876 Hypokalemia: Secondary | ICD-10-CM | POA: Diagnosis not present

## 2020-10-22 DIAGNOSIS — Z8673 Personal history of transient ischemic attack (TIA), and cerebral infarction without residual deficits: Secondary | ICD-10-CM | POA: Diagnosis not present

## 2020-10-22 DIAGNOSIS — R000 Tachycardia, unspecified: Secondary | ICD-10-CM | POA: Diagnosis not present

## 2020-10-22 DIAGNOSIS — G9341 Metabolic encephalopathy: Secondary | ICD-10-CM | POA: Diagnosis present

## 2020-10-22 LAB — URINALYSIS, ROUTINE W REFLEX MICROSCOPIC
Bilirubin Urine: NEGATIVE
Glucose, UA: NEGATIVE mg/dL
Ketones, ur: 20 mg/dL — AB
Nitrite: POSITIVE — AB
Protein, ur: 100 mg/dL — AB
Specific Gravity, Urine: 1.014 (ref 1.005–1.030)
pH: 5 (ref 5.0–8.0)

## 2020-10-22 LAB — CBC WITH DIFFERENTIAL/PLATELET
Abs Immature Granulocytes: 0.02 10*3/uL (ref 0.00–0.07)
Basophils Absolute: 0 10*3/uL (ref 0.0–0.1)
Basophils Relative: 0 %
Eosinophils Absolute: 0 10*3/uL (ref 0.0–0.5)
Eosinophils Relative: 0 %
HCT: 39 % (ref 36.0–46.0)
Hemoglobin: 12.8 g/dL (ref 12.0–15.0)
Immature Granulocytes: 0 %
Lymphocytes Relative: 2 %
Lymphs Abs: 0.1 10*3/uL — ABNORMAL LOW (ref 0.7–4.0)
MCH: 30.8 pg (ref 26.0–34.0)
MCHC: 32.8 g/dL (ref 30.0–36.0)
MCV: 94 fL (ref 80.0–100.0)
Monocytes Absolute: 0.1 10*3/uL (ref 0.1–1.0)
Monocytes Relative: 2 %
Neutro Abs: 5.5 10*3/uL (ref 1.7–7.7)
Neutrophils Relative %: 96 %
Platelets: 127 10*3/uL — ABNORMAL LOW (ref 150–400)
RBC: 4.15 MIL/uL (ref 3.87–5.11)
RDW: 13 % (ref 11.5–15.5)
WBC: 5.7 10*3/uL (ref 4.0–10.5)
nRBC: 0 % (ref 0.0–0.2)

## 2020-10-22 LAB — I-STAT VENOUS BLOOD GAS, ED
Acid-base deficit: 6 mmol/L — ABNORMAL HIGH (ref 0.0–2.0)
Bicarbonate: 16 mmol/L — ABNORMAL LOW (ref 20.0–28.0)
Calcium, Ion: 0.98 mmol/L — ABNORMAL LOW (ref 1.15–1.40)
HCT: 35 % — ABNORMAL LOW (ref 36.0–46.0)
Hemoglobin: 11.9 g/dL — ABNORMAL LOW (ref 12.0–15.0)
O2 Saturation: 83 %
Patient temperature: 104.5
Potassium: 3.1 mmol/L — ABNORMAL LOW (ref 3.5–5.1)
Sodium: 137 mmol/L (ref 135–145)
TCO2: 17 mmol/L — ABNORMAL LOW (ref 22–32)
pCO2, Ven: 26.1 mmHg — ABNORMAL LOW (ref 44.0–60.0)
pH, Ven: 7.408 (ref 7.250–7.430)
pO2, Ven: 54 mmHg — ABNORMAL HIGH (ref 32.0–45.0)

## 2020-10-22 LAB — COMPREHENSIVE METABOLIC PANEL
ALT: 21 U/L (ref 0–44)
AST: 61 U/L — ABNORMAL HIGH (ref 15–41)
Albumin: 3 g/dL — ABNORMAL LOW (ref 3.5–5.0)
Alkaline Phosphatase: 66 U/L (ref 38–126)
Anion gap: 11 (ref 5–15)
BUN: 22 mg/dL (ref 8–23)
CO2: 18 mmol/L — ABNORMAL LOW (ref 22–32)
Calcium: 8.3 mg/dL — ABNORMAL LOW (ref 8.9–10.3)
Chloride: 106 mmol/L (ref 98–111)
Creatinine, Ser: 1.09 mg/dL — ABNORMAL HIGH (ref 0.44–1.00)
GFR, Estimated: 52 mL/min — ABNORMAL LOW (ref >60)
Glucose, Bld: 138 mg/dL — ABNORMAL HIGH (ref 70–99)
Potassium: 3.1 mmol/L — ABNORMAL LOW (ref 3.5–5.1)
Sodium: 135 mmol/L (ref 135–145)
Total Bilirubin: 1 mg/dL (ref 0.3–1.2)
Total Protein: 5.8 g/dL — ABNORMAL LOW (ref 6.5–8.1)

## 2020-10-22 LAB — RESP PANEL BY RT-PCR (FLU A&B, COVID) ARPGX2
Influenza A by PCR: NEGATIVE
Influenza B by PCR: NEGATIVE
SARS Coronavirus 2 by RT PCR: NEGATIVE

## 2020-10-22 LAB — LIPASE, BLOOD: Lipase: 21 U/L (ref 11–51)

## 2020-10-22 LAB — LACTIC ACID, PLASMA
Lactic Acid, Venous: 1.5 mmol/L (ref 0.5–1.9)
Lactic Acid, Venous: 1.4 mmol/L (ref 0.5–1.9)

## 2020-10-22 LAB — APTT: aPTT: 25 seconds (ref 24–36)

## 2020-10-22 LAB — PROTIME-INR
INR: 1.3 — ABNORMAL HIGH (ref 0.8–1.2)
Prothrombin Time: 16.5 seconds — ABNORMAL HIGH (ref 11.4–15.2)

## 2020-10-22 MED ORDER — SODIUM CHLORIDE 0.9% FLUSH
3.0000 mL | Freq: Two times a day (BID) | INTRAVENOUS | Status: DC
  Administered 2020-10-22 – 2020-10-25 (×4): 3 mL via INTRAVENOUS

## 2020-10-22 MED ORDER — TOPIRAMATE 25 MG PO TABS
25.0000 mg | ORAL_TABLET | Freq: Two times a day (BID) | ORAL | Status: DC
  Administered 2020-10-23: 25 mg via ORAL
  Administered 2020-10-23: 75 mg via ORAL
  Administered 2020-10-23 – 2020-10-24 (×2): 25 mg via ORAL
  Administered 2020-10-24: 75 mg via ORAL
  Administered 2020-10-25: 25 mg via ORAL
  Filled 2020-10-22: qty 3
  Filled 2020-10-22: qty 1
  Filled 2020-10-22 (×4): qty 3
  Filled 2020-10-22: qty 1
  Filled 2020-10-22: qty 3

## 2020-10-22 MED ORDER — POLYETHYL GLYCOL-PROPYL GLYCOL 0.4-0.3 % OP GEL: OPHTHALMIC | Status: DC | PRN

## 2020-10-22 MED ORDER — SODIUM CHLORIDE 0.9 % IV SOLN
1.0000 g | INTRAVENOUS | Status: DC
  Administered 2020-10-22: 1 g via INTRAVENOUS
  Filled 2020-10-22: qty 10

## 2020-10-22 MED ORDER — SERTRALINE HCL 50 MG PO TABS
50.0000 mg | ORAL_TABLET | Freq: Every day | ORAL | Status: DC
  Administered 2020-10-23 – 2020-10-25 (×4): 50 mg via ORAL
  Filled 2020-10-22 (×4): qty 1

## 2020-10-22 MED ORDER — SODIUM CHLORIDE 0.9 % IV SOLN
2.0000 g | Freq: Once | INTRAVENOUS | Status: AC
  Administered 2020-10-22: 2 g via INTRAVENOUS
  Filled 2020-10-22: qty 2

## 2020-10-22 MED ORDER — ALBUTEROL SULFATE (2.5 MG/3ML) 0.083% IN NEBU: 2.5000 mg | INHALATION_SOLUTION | Freq: Four times a day (QID) | RESPIRATORY_TRACT | Status: DC | PRN

## 2020-10-22 MED ORDER — LACTATED RINGERS IV BOLUS (SEPSIS)
1000.0000 mL | Freq: Once | INTRAVENOUS | Status: AC
  Administered 2020-10-22: 1000 mL via INTRAVENOUS

## 2020-10-22 MED ORDER — PREGABALIN 25 MG PO CAPS
25.0000 mg | ORAL_CAPSULE | Freq: Three times a day (TID) | ORAL | Status: DC
  Administered 2020-10-23 – 2020-10-25 (×7): 25 mg via ORAL
  Filled 2020-10-22 (×8): qty 1

## 2020-10-22 MED ORDER — METRONIDAZOLE 500 MG/100ML IV SOLN
500.0000 mg | Freq: Once | INTRAVENOUS | Status: AC
  Administered 2020-10-22: 500 mg via INTRAVENOUS
  Filled 2020-10-22: qty 100

## 2020-10-22 MED ORDER — ACETAMINOPHEN 650 MG RE SUPP: 650.0000 mg | Freq: Once | RECTAL | Status: DC

## 2020-10-22 MED ORDER — ACETAMINOPHEN 650 MG RE SUPP: 650.0000 mg | Freq: Four times a day (QID) | RECTAL | Status: DC | PRN

## 2020-10-22 MED ORDER — FLUTICASONE PROPIONATE 50 MCG/ACT NA SUSP
1.0000 | Freq: Every day | NASAL | Status: DC | PRN
  Administered 2020-10-24: 1 via NASAL
  Filled 2020-10-22: qty 16

## 2020-10-22 MED ORDER — POTASSIUM CHLORIDE CRYS ER 20 MEQ PO TBCR
40.0000 meq | EXTENDED_RELEASE_TABLET | ORAL | Status: AC
  Administered 2020-10-22: 40 meq via ORAL
  Filled 2020-10-22: qty 2

## 2020-10-22 MED ORDER — ENOXAPARIN SODIUM 40 MG/0.4ML IJ SOSY
40.0000 mg | PREFILLED_SYRINGE | Freq: Every day | INTRAMUSCULAR | Status: DC
  Administered 2020-10-22 – 2020-10-24 (×3): 40 mg via SUBCUTANEOUS
  Filled 2020-10-22 (×3): qty 0.4

## 2020-10-22 MED ORDER — ACETAMINOPHEN 325 MG PO TABS
650.0000 mg | ORAL_TABLET | Freq: Four times a day (QID) | ORAL | Status: DC | PRN
  Administered 2020-10-22: 650 mg via ORAL
  Filled 2020-10-22: qty 2

## 2020-10-22 MED ORDER — LORATADINE 10 MG PO TABS
10.0000 mg | ORAL_TABLET | Freq: Every day | ORAL | Status: DC
  Administered 2020-10-23 – 2020-10-25 (×4): 10 mg via ORAL
  Filled 2020-10-22 (×5): qty 1

## 2020-10-22 MED ORDER — CLOPIDOGREL BISULFATE 75 MG PO TABS
75.0000 mg | ORAL_TABLET | Freq: Every day | ORAL | Status: DC
  Administered 2020-10-23 – 2020-10-25 (×4): 75 mg via ORAL
  Filled 2020-10-22 (×4): qty 1

## 2020-10-22 MED ORDER — VANCOMYCIN HCL 1250 MG/250ML IV SOLN
1250.0000 mg | Freq: Once | INTRAVENOUS | Status: AC
  Administered 2020-10-22: 1250 mg via INTRAVENOUS
  Filled 2020-10-22: qty 250

## 2020-10-22 MED ORDER — LACTATED RINGERS IV SOLN
INTRAVENOUS | Status: DC
  Administered 2020-10-23: 100 mL via INTRAVENOUS

## 2020-10-22 MED ORDER — SODIUM CHLORIDE 0.9 % IV SOLN: 2.0000 g | Freq: Two times a day (BID) | INTRAVENOUS | Status: DC

## 2020-10-22 MED ORDER — POLYVINYL ALCOHOL 1.4 % OP SOLN
1.0000 [drp] | OPHTHALMIC | Status: DC | PRN
  Filled 2020-10-22: qty 15

## 2020-10-22 MED ORDER — VANCOMYCIN HCL 750 MG/150ML IV SOLN: 750.0000 mg | INTRAVENOUS | Status: DC

## 2020-10-22 MED ORDER — SODIUM CHLORIDE 0.9 % IV BOLUS
500.0000 mL | Freq: Once | INTRAVENOUS | Status: AC
  Administered 2020-10-22: 500 mL via INTRAVENOUS

## 2020-10-22 NOTE — H&P (Addendum)
Emergency History and Physical    RHEGAN TRUNNELL SEG:[id number] DOB: 1940-06-24 DOA: 10/22/2020  Referring MD/NP/PA: Coralee Pesa, DO PCP: Arnette Felts, FNP  Patient coming from: home   Chief Complaint: Altered mental status  I have personally briefly reviewed patient's old medical records in [facility]   HPI: [name] is a 80 y.o. female with medical history significant of HFpEF last EF 55 -60% in 2015, fibromyalgia, degenerative joint disease, and gait abnormality who presented after being found to be acutely altered by family.  Most of the history is obtained from the patient's daughter over the phone, but the patient is able to provide some of her own history at this time.  She recalls falling off the bed while sleeping yesterday around 5:30 AM.  Her daughter notes that the patient stated that she had hit her head and sustained a small bruise on her tailbone.  Patient was not able to be helped off the floor until later that evening around 6:30 PM after her children were not able to get a hold of her by phone.  Apparently, over this weekend the patient has also had intermittent slurred speech.  Her daughter reported that yesterday afternoon after they got her on the floor she seemed to be confused and was talking out of her head.  This morning the patient seemed to be confused and talking to herself for which family became more concerned and recommended her to come to the hospital for further evaluation.  They also noticed that her hands were shaking patient complains of having back, neck, hand, and knee pain.  She chronically has some quite large swelling that is unchanged.  Patient denies having any other symptoms at this time.   ED course: Patient was noted to be febrile up to 104.5 F heart rate 92-1 10, respiration 19-34, blood pressures 107/58-120 8/55, and O2 saturation maintained on room air.  Labs significant for hemoglobin 9.7, platelets 127, platelets 3.1, BUN 22,   creatinine 1.09, lactic acid 1.5. Influenza and COVID-19 screening were negative.  Chest x-ray noted borderline cardiomegaly with vascular congestion, but without overt pulmonary edema.  Urinalysis was positive for moderate hemoglobin positive nitrites, many bacteria, and 21-50 WBCs.  Blood cultures were obtained.  Patient had initially been given 2 L fluid bolus, Tylenol, vancomycin, metronidazole, and cefepime.  Review of Systems  Unable to perform ROS: Mental status change  Constitutional:  Negative for fever.  HENT:  Negative for ear discharge and nosebleeds.   Eyes:  Negative for photophobia and pain.  Respiratory:  Negative for shortness of breath.   Cardiovascular:  Positive for leg swelling. Negative for chest pain.  Gastrointestinal:  Negative for abdominal pain and diarrhea.  Genitourinary:  Negative for dysuria.  Musculoskeletal:  Positive for back pain, falls, joint pain and myalgias.  Skin:  Negative for itching and rash.  Neurological:  Positive for speech change. Negative for loss of consciousness.   Past Medical History:  Diagnosis Date   Abnormality of gait 04/10/2015   Cervical spondylosis without myelopathy 07/18/2013   Common migraine with intractable migraine 04/10/2015   DJD (degenerative joint disease)    Fibromyalgia    Hiatal hernia    Migraine headache    Vitreomacular adhesion of right eye 03/08/2020    Past Surgical History:  Procedure Laterality Date   ABDOMINAL HYSTERECTOMY     DILATION AND CURETTAGE OF UTERUS     HIATAL HERNIA REPAIR     TONSILLECTOMY  reports that she has never smoked. She has never used smokeless tobacco. She reports that she does not drink alcohol and does not use drugs.  Allergies  Allergen Reactions   Aspirin Anaphylaxis and Hives   Elavil [Amitriptyline] Anaphylaxis   Latex    Relafen [Nabumetone] Hives    Family History  Problem Relation Age of Onset   CVA Mother    Stroke Father    Emphysema Father     Prior  to Admission medications   Medication Sig Start Date End Date Taking? Authorizing Provider  acetaminophen (TYLENOL) 500 MG tablet Take 1,000 mg by mouth every 6 (six) hours as needed.   Yes [provider]  cetirizine (ZYRTEC) 10 MG tablet Take 10 mg by mouth daily.   Yes [provider]  Cholecalciferol (VITAMIN D) 2000 units tablet Take 2,000 Units by mouth daily.   Yes [provider]  clopidogrel (PLAVIX) 75 MG tablet TAKE 1 TABLET BY MOUTH EVERY DAY 08/09/20  Yes Arnette FeltsMoore, Janece, FNP  fluticasone (FLONASE) 50 MCG/ACT nasal spray Place into both nostrils daily as needed for allergies or rhinitis.   Yes [provider]  Polyethyl Glycol-Propyl Glycol (SYSTANE OP) Apply to eye. As needed   Yes [provider]  pregabalin (LYRICA) 25 MG capsule Take 1 capsule 3 times daily 05/16/20  Yes Glean SalvoSlack, Sarah J, NP  sertraline (ZOLOFT) 50 MG tablet TAKE 1 TABLET BY MOUTH EVERY DAY 05/24/20  Yes Arnette FeltsMoore, Janece, FNP  topiramate (TOPAMAX) 25 MG tablet Take one tablet in the morning and 3 in the evening 05/16/20  Yes Glean SalvoSlack, Sarah J, NP  vitamin C (ASCORBIC ACID) 250 MG tablet Take 250 mg by mouth daily.    Yes [provider]    Physical Exam:  Constitutional: Elderly female currently in no acute distress Vitals:   10/22/20 1330 10/22/20 1400 10/22/20 1430 10/22/20 1500  BP: (!) 119/51 (!) 115/58 (!) 115/56 113/65  Pulse: (!) 109 (!) 103 99 (!) 103  Resp: (!) 25 (!) 24 (!) 31 20  Temp:      TempSrc:      SpO2: 93% 96% 92% 95%  Weight:      Height:       Eyes: PERRL, lids and conjunctivae normal ENMT: Mucous membranes are dry.  Posterior pharynx clear of any exudate or lesions.  Neck: normal, supple, no masses, no thyromegaly Respiratory: Normal respiratory effort without significant wheezes or rhonchi appreciated.  Patient currently on room air with O2 saturations maintained. Cardiovascular: Regular rate and rhythm, no murmurs / rubs / gallops. No  extremity edema. 2+ pedal pulses. No carotid bruits.  Abdomen: no tenderness, no masses palpated. No hepatosplenomegaly. Bowel sounds positive.  Musculoskeletal: no clubbing / cyanosis. No joint deformity upper and lower extremities. Good ROM, no contractures. Normal muscle tone.  Skin: no rashes, lesions, ulcers. No induration Neurologic: CN 2-12 grossly intact.  Able to move all extremities.  Speech is currently slurred. Psychiatric: Alert and oriented x 2 person and place at this time normal mood.     Labs on Admission: I have personally reviewed following labs and imaging studies  CBC: Recent Labs  Lab 10/22/20 1245 10/22/20 1255  WBC 5.7  --   NEUTROABS 5.5  --   HGB 12.8 11.9*  HCT 39.0 35.0*  MCV 94.0  --   PLT 127*  --    Basic Metabolic Panel: Recent Labs  Lab 10/22/20 1245 10/22/20 1255  NA 135 137  K 3.1* 3.1*  CL 106  --   CO2 18*  --   GLUCOSE 138*  --   BUN 22  --   CREATININE 1.09*  --   CALCIUM 8.3*  --    GFR: Estimated Creatinine Clearance: 40.3 mL/min (A) (by C-G formula based on SCr of 1.09 mg/dL (H)). Liver Function Tests: Recent Labs  Lab 10/22/20 1245  AST 61*  ALT 21  ALKPHOS 66  BILITOT 1.0  PROT 5.8*  ALBUMIN 3.0*   Recent Labs  Lab 10/22/20 1245  LIPASE 21   No results for input(s): AMMONIA in the last 168 hours. Coagulation Profile: Recent Labs  Lab 10/22/20 1245  INR 1.3*   Cardiac Enzymes: No results for input(s): CKTOTAL, CKMB, CKMBINDEX, TROPONINI in the last 168 hours. BNP (last 3 results) No results for input(s): PROBNP in the last 8760 hours. HbA1C: No results for input(s): HGBA1C in the last 72 hours. CBG: No results for input(s): GLUCAP in the last 168 hours. Lipid Profile: No results for input(s): CHOL, HDL, LDLCALC, TRIG, CHOLHDL, LDLDIRECT in the last 72 hours. Thyroid Function Tests: No results for input(s): TSH, T4TOTAL, FREET4, T3FREE, THYROIDAB in the last 72 hours. Anemia Panel: No results for  input(s): VITAMINB12, FOLATE, FERRITIN, TIBC, IRON, RETICCTPCT in the last 72 hours. Urine analysis:    Component Value Date/Time   COLORURINE YELLOW 10/22/2020 1250   APPEARANCEUR CLOUDY (A) 10/22/2020 1250   LABSPEC 1.014 10/22/2020 1250   PHURINE 5.0 10/22/2020 1250   GLUCOSEU NEGATIVE 10/22/2020 1250   HGBUR MODERATE (A) 10/22/2020 1250   BILIRUBINUR NEGATIVE 10/22/2020 1250   BILIRUBINUR negative 05/01/2020 1707   KETONESUR 20 (A) 10/22/2020 1250   PROTEINUR 100 (A) 10/22/2020 1250   UROBILINOGEN 0.2 05/01/2020 1707   UROBILINOGEN 0.2 05/29/2013 1851   NITRITE POSITIVE (A) 10/22/2020 1250   LEUKOCYTESUR MODERATE (A) 10/22/2020 1250   Sepsis Labs: Recent Results (from the past 240 hour(s))  Resp Panel by RT-PCR (Flu A&B, Covid) Nasopharyngeal Swab     Status: None   Collection Time: 10/22/20  1:05 PM   Specimen: Nasopharyngeal Swab; Nasopharyngeal(NP) swabs in vial transport medium  Result Value Ref Range Status   SARS Coronavirus 2 by RT PCR NEGATIVE NEGATIVE Final    Comment: (NOTE) SARS-CoV-2 target nucleic acids are NOT DETECTED.  The SARS-CoV-2 RNA is generally detectable in upper respiratory specimens during the acute phase of infection. The lowest concentration of SARS-CoV-2 viral copies this assay can detect is 138 copies/mL. A negative result does not preclude SARS-Cov-2 infection and should not be used as the sole basis for treatment or other patient management decisions. A negative result may occur with  improper specimen collection/handling, submission of specimen other than nasopharyngeal swab, presence of viral mutation(s) within the areas targeted by this assay, and inadequate number of viral copies(<138 copies/mL). A negative result must be combined with clinical observations, patient history, and epidemiological information. The expected result is Negative.  Fact Sheet for Patients:  BloggerCourse.com  Fact Sheet for Healthcare  Providers:  SeriousBroker.it  This test is no t yet approved or cleared by the [place] FDA and  has been authorized for detection and/or diagnosis of SARS-CoV-2 by FDA under an Emergency Use Authorization (EUA). This EUA will remain  in effect (meaning this test can be used) for the duration of the COVID-19 declaration under Section 564(b)(1) of the Act, 21 U.S.C.section 360bbb-3(b)(1), unless the authorization is terminated  or revoked sooner.       Influenza A by PCR  NEGATIVE NEGATIVE Final   Influenza B by PCR NEGATIVE NEGATIVE Final    Comment: (NOTE) The Xpert Xpress SARS-CoV-2/FLU/RSV plus assay is intended as an aid in the diagnosis of influenza from Nasopharyngeal swab specimens and should not be used as a sole basis for treatment. Nasal washings and aspirates are unacceptable for Xpert Xpress SARS-CoV-2/FLU/RSV testing.  Fact Sheet for Patients: BloggerCourse.com  Fact Sheet for Healthcare Providers: SeriousBroker.it  This test is not yet approved or cleared by the [place] FDA and has been authorized for detection and/or diagnosis of SARS-CoV-2 by FDA under an Emergency Use Authorization (EUA). This EUA will remain in effect (meaning this test can be used) for the duration of the COVID-19 declaration under Section 564(b)(1) of the Act, 21 U.S.C. section 360bbb-3(b)(1), unless the authorization is terminated or revoked.  Performed at [facility], [address], [place], [place] [address]      Radiological Exams on Admission: DG Chest Port 1 View  Result Date: 10/22/2020 CLINICAL DATA:  Questionable sepsis EXAM: PORTABLE CHEST 1 VIEW COMPARISON:  Chest radiograph 01/12/2020 FINDINGS: The heart is at the upper limits of normal size, and the mediastinum appears prominent, both likely exaggerated by AP technique and rightward patient rotation. Lung volumes are low. There is no  congestion without over pulmonary edema. There is no focal consolidation. There is no pleural effusion. There is no pneumothorax. There is no acute osseous abnormality. There is degenerative change of both shoulders. IMPRESSION: Borderline cardiomegaly with vascular congestion but no overt pulmonary edema. Electronically Signed   By: Lesia Hausen M.D.   On: 10/22/2020 13:04    EKG: Independently reviewed.  Sinus tachycardia 114 bpm Assessment/Plan Sepsis secondary to urinary tract infection: Acute.  Patient presents after being found acutely altered.  Noted to be febrile up to 104.5 F with tachycardia and tachypnea.  Urinalysis was significant for moderate hemoglobin, positive nitrites, many bacteria, and 21-50 WBCs.  Patient had initially been started on empiric antibiotics of vancomycin, cefepime, and metronidazole. -Admit to a medical telemetry bed -Follow-up blood and urine cultures -De-escalated antibiotics to Rocephin IV -Tylenol as needed for fever  Acute metabolic encephalopathy dysarthria: The patient had reportedly been confused and slurring her speech intermittently over the weekend. -Neurochecks -Check CT scan of the brain -May warrant further work-up if symptoms persist  Fall at home: Patient fell out of the bed yesterday morning while she was sleeping and was unable to get back up.  At baseline patient has a gait abnormality for which she normally had gotten around with a cane. -PT/OT to evaluate and treat  Hypokalemia: Acute.  Potassium noted to be 3.1 on admission. -Give potassium chloride 40 mEq x 1 dose now -Continue to monitor and replace as needed  Thrombocytopenia: Acute.  Platelet count 127 on admission. -Recheck CBC tomorrow morning  Migraine headaches: Patient is followed in the outpatient setting by [facility]. -Continue Topamax  Depression -Continue Zoloft  Fibromyalgia: Followed in the outpatient setting by [facility]. -Continue Lyrica  DVT prophylaxis: lovenox  Code Status: Full Family Communication: Daughter updated over the phone Disposition Plan: To be determined Consults called: None Admission status: Inpatient, for need of 2 midnight stay for need of IV antibiotic  Clydie Braun MD [facility]   If 7PM-7AM, please contact night-coverage   10/22/2020, 3:29 PM

## 2020-10-22 NOTE — ED Triage Notes (Signed)
Pt BIB [facility] from home. Pt had a fall Thursday since has had daughter staying with her. Pt LKW was 2am. Pt is now confused, at 945 daughter noticed she was talking to someone but not sure who and not making sense. Pt was unable to stand. Pt able to follow some commands. One minutes speech is garbled next it is fine. Pt is normally alert and oriented x4.

## 2020-10-22 NOTE — ED Provider Notes (Signed)
[facility] EMERGENCY DEPARTMENT Provider Note   CSN: [id number] Arrival date & time: 10/22/20  1214     History No chief complaint on file.   [name] is a 80 y.o. female.  HPI  80 year old female past medical history of migraines, fibromyalgia presents the emergency department concern for altered mental status and fever.  History is limited secondary to altered mental status, no family at bedside.  Patient is reportedly from home and has been having her daughter stay with her.  The daughter called an ambulance when she noticed that she seemed to be hallucinating and altered.  Patient was very weak and unable to stand.  Is oriented to herself but otherwise a poor historian.  No reported head injury.  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 10/26/2019   Intermediate stage nonexudative age-related macular degeneration of both eyes 10/26/2019   Vitamin D deficiency 06/28/2019   Abnormal glucose 10/21/2018   History of TIA (transient ischemic attack) 06/17/2018   Recurrent major depressive disorder, in partial remission (HCC) 06/17/2018   Seasonal allergies 06/17/2018   Depression 02/11/2018   Hiatal hernia 02/11/2018   Dysphagia 02/11/2018   Hyperkalemia 02/11/2018   Abnormality of gait 04/10/2015   Cervical spondylosis without myelopathy 07/18/2013   Left-sided weakness 05/29/2013   Left sided numbness  05/29/2013   Headache 05/29/2013   TIA (transient ischemic attack) 05/29/2013   Neck pain on left side 05/29/2013   Fibromyalgia     Past Surgical History:  Procedure Laterality Date   ABDOMINAL HYSTERECTOMY     DILATION AND CURETTAGE OF UTERUS     HIATAL HERNIA REPAIR     TONSILLECTOMY       OB History   No obstetric history on file.     Family History  Problem Relation Age of Onset   CVA Mother    Stroke Father    Emphysema Father     Social History   Tobacco Use   Smoking status: Never   Smokeless tobacco: Never  Vaping Use   Vaping Use: Never used  Substance Use Topics   Alcohol use: No    Comment: very occasional   Drug use: No    Home Medications Prior to Admission medications   Medication Sig Start Date End Date Taking? Authorizing Provider  acetaminophen (TYLENOL) 500 MG tablet Take 1,000 mg by mouth every 6 (six) hours as needed.    [provider]  Boswellia-Glucosamine-Vit D (OSTEO BI-FLEX ONE PER DAY PO) Take by mouth. Patient not taking: Reported on 09/27/2020    [provider]  Calcium Carbonate-Vit D-Min (CALCIUM 1200 PO) Take 1 capsule by mouth daily.    [provider]  cetirizine (ZYRTEC) 10 MG tablet Take 10 mg by mouth daily. Patient not taking: Reported on 09/27/2020    [provider]  Cholecalciferol (VITAMIN D) 2000 units tablet Take 2,000 Units by mouth daily.    [provider]  clopidogrel (PLAVIX) 75 MG tablet TAKE 1 TABLET BY MOUTH EVERY DAY 08/09/20   Arnette Felts, FNP  fluticasone [facility]) 50 MCG/ACT nasal spray Place into both nostrils daily as needed for allergies or rhinitis.    [provider]  Omega-3 Fatty Acids (FISH OIL PO) Take 1 capsule by mouth daily. Takes it sometimes    [provider]  Polyethyl Glycol-Propyl Glycol (SYSTANE OP) Apply to eye. As needed    [provider]  pregabalin (LYRICA) 25 MG capsule Take 1 capsule 3 times daily 05/16/20    Glean Salvo, NP  sertraline (ZOLOFT) 50 MG tablet TAKE 1 TABLET BY MOUTH EVERY DAY 05/24/20   Arnette Felts, FNP  topiramate (TOPAMAX) 25 MG tablet Take one tablet in the morning and 3 in the evening 05/16/20   Glean Salvo, NP  vitamin C (ASCORBIC ACID) 250 MG tablet Take 250 mg by mouth daily.     [provider]    Allergies    Aspirin, Elavil [amitriptyline], Latex, and Relafen [nabumetone]  Review of Systems   Review of Systems  Unable to perform ROS: Mental status change   Physical Exam Updated Vital Signs BP (!) 128/55   Pulse (!) 110   Temp (!) 104.5 F (40.3 C) (Rectal)   Resp (!) 33   Ht 5\' 4"  (1.626 m)   Wt 68 kg   SpO2 94%   BMI 25.73 kg/m   Physical Exam Vitals and nursing note reviewed.  Constitutional:      Appearance: Normal appearance. She is ill-appearing.  HENT:     Head: Normocephalic.     Mouth/Throat:     Mouth: Mucous membranes are moist.  Eyes:     Pupils: Pupils are equal, round, and reactive to light.  Cardiovascular:     Rate and Rhythm: Normal rate.  Pulmonary:     Effort: Pulmonary effort is normal. No respiratory distress.  Abdominal:     Palpations: Abdomen is soft.     Tenderness: There is no guarding.  Musculoskeletal:        General: No deformity.  Skin:    General: Skin is warm.  Neurological:     General: No focal deficit present.     Mental Status: She is alert. She is disoriented.  Psychiatric:        Mood and Affect: Mood normal.    ED Results / Procedures / Treatments   Labs (all labs ordered are listed, but only abnormal results are displayed) Labs Reviewed  I-STAT VENOUS BLOOD GAS, ED - Abnormal; Notable for the following components:      Result Value   pCO2, Ven 26.1 (*)    pO2, Ven 54.0 (*)    Bicarbonate 16.0 (*)    TCO2 17 (*)    Acid-base deficit 6.0 (*)    Potassium 3.1 (*)    Calcium, Ion 0.98 (*)    HCT 35.0 (*)    Hemoglobin 11.9 (*)    All other components within normal limits  RESP  PANEL BY RT-PCR (FLU A&B, COVID) ARPGX2  CULTURE, BLOOD (ROUTINE X 2)  CULTURE, BLOOD (ROUTINE X 2)  URINE CULTURE  LACTIC ACID, PLASMA  LACTIC ACID, PLASMA  COMPREHENSIVE METABOLIC PANEL  CBC WITH DIFFERENTIAL/PLATELET  PROTIME-INR  APTT  URINALYSIS, ROUTINE W REFLEX MICROSCOPIC  LIPASE, BLOOD  MISCELLANEOUS GENETIC TEST    EKG None  Radiology DG Chest Port 1 View  Result Date: 10/22/2020 CLINICAL DATA:  Questionable sepsis EXAM: PORTABLE CHEST  1 VIEW COMPARISON:  Chest radiograph 01/12/2020 FINDINGS: The heart is at the upper limits of normal size, and the mediastinum appears prominent, both likely exaggerated by AP technique and rightward patient rotation. Lung volumes are low. There is no congestion without over pulmonary edema. There is no focal consolidation. There is no pleural effusion. There is no pneumothorax. There is no acute osseous abnormality. There is degenerative change of both shoulders. IMPRESSION: Borderline cardiomegaly with vascular congestion but no overt pulmonary edema. Electronically Signed   By: Lesia Hausen M.D.   On: 10/22/2020 13:04    Procedures .Critical Care  Date/Time: 10/22/2020 3:54 PM Performed by: Rozelle Logan, DO Authorized by: Rozelle Logan, DO   Critical care provider statement:    Critical care time (minutes):  45   Critical care was necessary to treat or prevent imminent or life-threatening deterioration of the following conditions:  Sepsis   Critical care was time spent personally by me on the following activities:  Discussions with consultants, evaluation of patient's response to treatment, examination of patient, ordering and performing treatments and interventions, ordering and review of laboratory studies, ordering and review of radiographic studies, pulse oximetry, re-evaluation of patient's condition, obtaining history from patient or surrogate and review of old charts   I assumed direction of critical care for this patient  from another provider in my specialty: no     Care discussed with: admitting provider     Medications Ordered in ED Medications  lactated ringers infusion (has no administration in time range)  lactated ringers bolus 1,000 mL (1,000 mLs Intravenous New Bag/Given 10/22/20 1306)    And  lactated ringers bolus 1,000 mL (has no administration in time range)  ceFEPIme (MAXIPIME) 2 g in sodium chloride 0.9 % 100 mL IVPB (2 g Intravenous New Bag/Given 10/22/20 1320)  metroNIDAZOLE (FLAGYL) IVPB 500 mg (has no administration in time range)  vancomycin (VANCOREADY) IVPB 1250 mg/250 mL (has no administration in time range)    ED Course  I have reviewed the triage vital signs and the nursing notes.  Pertinent labs & imaging results that were available during my care of the patient were reviewed by me and considered in my medical decision making (see chart for details).    MDM Rules/Calculators/A&P                           81 year old female presents the emergency department with concern for altered mental status.  Febrile on arrival and tachycardic.  Stable blood pressure.  She is oriented to her self, pleasant and follows commands but otherwise confused.  Work-up shows a mild hypokalemia, urinary tract infection, normal lactic and white blood cell count.  Patient was given antipyretic and fluids.  Heart rate has normalized.  Patient was treated broadly per sepsis protocol, urinalysis has been sent for urine culture.  Chest x-ray looks clear, flu and COVID are negative.  Patient will be admitted for urosepsis.  Patients evaluation and results requires admission for further treatment and care. Patient agrees with admission plan, offers no new complaints and is stable/unchanged at time of admit.  Final Clinical Impression(s) / ED Diagnoses Final diagnoses:  None    Rx / DC Orders ED Discharge Orders     None        Rozelle Logan, DO 10/22/20 1555

## 2020-10-22 NOTE — Progress Notes (Signed)
Elink following for code sepsis 

## 2020-10-22 NOTE — Progress Notes (Signed)
Pharmacy Antibiotic Note  [name] is a 80 y.o. female admitted on 10/22/2020 with sepsis.  Pharmacy has been consulted for vancomycin and cefepime dosing.  Plan: Vancomycin 1250mg  x1 then 750mg  IV q24h (eAUC 439, Cr 1.09mg /dL Cefepime 2g IV -Monitor renal function, clinical status, and antibiotic plan -Order vanc levels as necessary  Height: 5\' 4"  (162.6 cm) Weight: 68 kg (149 lb 14.6 oz) IBW/kg (Calculated) : 54.7  Temp (24hrs), Avg:102.9 F (39.4 C), Min:101.3 F (38.5 C), Max:104.5 F (40.3 C)  Recent Labs  Lab 10/22/20 1233 10/22/20 1245  WBC  --  5.7  CREATININE  --  1.09*  LATICACIDVEN 1.5  --     Estimated Creatinine Clearance: 40.3 mL/min (A) (by C-G formula based on SCr of 1.09 mg/dL (H)).    Allergies  Allergen Reactions   Aspirin Anaphylaxis and Hives   Elavil [Amitriptyline] Anaphylaxis   Latex    Relafen [Nabumetone] Hives    Antimicrobials this admission: Vanc 8/15 >>  Cefepime 8/15 >>  Flagyl x1 dose in ED  Dose adjustments this admission: N/A  Microbiology results: 8/15 BCx: pending 8/15 UCx: pending   Thank you for allowing pharmacy to be a part of this patient's care.  9/15, PharmD, [facility] Emergency Medicine Clinical Pharmacist ED RPh Phone: [phone] Main RX: [phone]

## 2020-10-22 NOTE — ED Notes (Signed)
Patient transported to CT 

## 2020-10-23 LAB — BLOOD CULTURE ID PANEL (REFLEXED) - BCID2

## 2020-10-23 LAB — CBC
HCT: 34.5 % — ABNORMAL LOW (ref 36.0–46.0)
Hemoglobin: 11.4 g/dL — ABNORMAL LOW (ref 12.0–15.0)
MCH: 30.7 pg (ref 26.0–34.0)
MCHC: 33 g/dL (ref 30.0–36.0)
MCV: 93 fL (ref 80.0–100.0)
Platelets: 101 10*3/uL — ABNORMAL LOW (ref 150–400)
RBC: 3.71 MIL/uL — ABNORMAL LOW (ref 3.87–5.11)
RDW: 13.1 % (ref 11.5–15.5)
WBC: 4.1 10*3/uL (ref 4.0–10.5)
nRBC: 0 % (ref 0.0–0.2)

## 2020-10-23 LAB — BASIC METABOLIC PANEL
Anion gap: 9 (ref 5–15)
BUN: 19 mg/dL (ref 8–23)
CO2: 19 mmol/L — ABNORMAL LOW (ref 22–32)
Calcium: 7.9 mg/dL — ABNORMAL LOW (ref 8.9–10.3)
Chloride: 108 mmol/L (ref 98–111)
Creatinine, Ser: 0.96 mg/dL (ref 0.44–1.00)
GFR, Estimated: >60 mL/min (ref >60)
Glucose, Bld: 112 mg/dL — ABNORMAL HIGH (ref 70–99)
Potassium: 3.1 mmol/L — ABNORMAL LOW (ref 3.5–5.1)
Sodium: 136 mmol/L (ref 135–145)

## 2020-10-23 MED ORDER — LACTATED RINGERS IV BOLUS
500.0000 mL | Freq: Once | INTRAVENOUS | Status: AC
  Administered 2020-10-23: 500 mL via INTRAVENOUS

## 2020-10-23 MED ORDER — LACTATED RINGERS IV SOLN: INTRAVENOUS | Status: AC

## 2020-10-23 MED ORDER — POTASSIUM CHLORIDE CRYS ER 20 MEQ PO TBCR
40.0000 meq | EXTENDED_RELEASE_TABLET | Freq: Once | ORAL | Status: AC
  Administered 2020-10-23: 40 meq via ORAL
  Filled 2020-10-23: qty 2

## 2020-10-23 MED ORDER — SODIUM CHLORIDE 0.9 % IV SOLN
2.0000 g | INTRAVENOUS | Status: DC
  Administered 2020-10-23 – 2020-10-24 (×2): 2 g via INTRAVENOUS
  Filled 2020-10-23 (×3): qty 20

## 2020-10-23 NOTE — Plan of Care (Signed)
  Problem: Urinary Elimination: Goal: Signs and symptoms of infection will decrease Outcome: Progressing   

## 2020-10-23 NOTE — Plan of Care (Signed)
  Problem: Urinary Elimination: Goal: Signs and symptoms of infection will decrease Outcome: Progressing   Problem: Urinary Elimination: Goal: Signs and symptoms of infection will decrease Outcome: Progressing

## 2020-10-23 NOTE — Evaluation (Signed)
Physical Therapy Treatment Patient Details Name: [name] MRN: [id number] DOB: 02-Mar-1941 Today's Date: 10/23/2020    History of Present Illness 80yo female admitted 10/22/20 after falling OOB and hitting her head at 10; family not able to get to her until 1830 that evening. Hit head and her tailbone area in the fall. Had increased AMS and family decided to bring her to the ED. Found to be in sepsis secondary to UTI and with acute metabolic encephalopathy. PMH fibromyalgia, DJD    PT Comments    Patient received in ED stretcher, very pleasant but tangential. Daughter present and assisted in providing history. MMT assessed in bed with hip flexors being 3/5, ankle dorsiflexions 5/5, hip extensors 4/5 in supine. Able to get to/from EOB with MinA and BPs stable. Does have some light headedness and dizziness when sitting up- BP OK so question if she may have some possible BPPV after hitting her head in her fall? Will be easier to assess in hospital bed. Left on stretcher with all needs met, family present. May benefit from SNF- sounds like her home is too narrow to accommodate DME like WC or RW in some areas- to improve general strength and balance prior to return home.     Follow Up Recommendations  SNF;Supervision/Assistance - 24 hour (if she/family refuse SNF, will need HHPT with 24/7A)     Equipment Recommendations  Rolling walker with 5" wheels;3in1 (PT)    Recommendations for Other Services       Precautions / Restrictions Precautions Precautions: Fall;Other (comment) Precaution Comments: watch blood pressures, chronic sciatica Restrictions Weight Bearing Restrictions: No    Mobility  Bed Mobility Overal bed mobility: Needs Assistance Bed Mobility: Supine to Sit;Sit to Supine     Supine to sit: Min assist;HOB elevated Sit to supine: Min assist;HOB elevated   General bed mobility comments: bed of ED stretcher maximally elevated, minA for BLE and trunk management. Able to  keep balance even on uneven ED stretcher    Transfers                 General transfer comment: deferred- safety/concern over ability to get back into ED stretcher  Ambulation/Gait             General Gait Details: deferred- safety/concern over ability to get back into ED stretcher   Stairs             Wheelchair Mobility    Modified Rankin (Stroke Patients Only)       Balance Overall balance assessment: History of Falls;Needs assistance Sitting-balance support: Bilateral upper extremity supported;Feet unsupported Sitting balance-Leahy Scale: Fair Sitting balance - Comments: min guard on uneven surface                                    Cognition Arousal/Alertness: Awake/alert Behavior During Therapy: WFL for tasks assessed/performed Overall Cognitive Status: Within Functional Limits for tasks assessed                                 General Comments: pleasant but a bit tangential- however, able to give accurate history, able to spell world backwards, 3/3 on word recall, but did have difficulty with serial 7s. Limited safety awareness and insight into deficits      Exercises      General Comments General comments (skin integrity, edema, etc.):  VSS on RA, BP 90s/50s at rest and 108/60s sitting edge of stretcher      Pertinent Vitals/Pain Pain Assessment: Faces Faces Pain Scale: Hurts little more Pain Location: generalized with mobility Pain Descriptors / Indicators: Discomfort Pain Intervention(s): Limited activity within patient's tolerance;Monitored during session    Home Living Family/patient expects to be discharged to:: Private residence Living Arrangements: Alone Available Help at Discharge: Family;Available PRN/intermittently Type of Home: House Home Access: Stairs to enter Entrance Stairs-Rails: None Home Layout: One level Home Equipment: Grab bars - tub/shower;Walker - 2 wheels;Cane - single point;Shower  seat Additional Comments: shower seat is a "house" for cats outside and not inside her home; does not have WC but might be able to borrow one from church, regardless would have issues fitting WC thru doors in her home    Prior Function Level of Independence: Independent with assistive device(s)      Comments: sometimes has problems moving around, sometimes good and bad days   PT Goals (current goals can now be found in the care plan section) Acute Rehab PT Goals Patient Stated Goal: get stronger PT Goal Formulation: With patient/family Time For Goal Achievement: 11/06/20 Potential to Achieve Goals: Fair    Frequency    Min 3X/week      PT Plan      Co-evaluation              AM-PAC PT "6 Clicks" Mobility   Outcome Measure  Help needed turning from your back to your side while in a flat bed without using bedrails?: A Little Help needed moving from lying on your back to sitting on the side of a flat bed without using bedrails?: A Little Help needed moving to and from a bed to a chair (including a wheelchair)?: A Lot Help needed standing up from a chair using your arms (e.g., wheelchair or bedside chair)?: A Lot Help needed to walk in hospital room?: A Lot Help needed climbing 3-5 steps with a railing? : A Lot 6 Click Score: 14    End of Session   Activity Tolerance: Patient tolerated treatment well Patient left: in bed;with call bell/phone within reach;with family/visitor present;Other (comment) (ED stretcher) Nurse Communication: Mobility status PT Visit Diagnosis: Muscle weakness (generalized) (M62.81);History of falling (Z91.81);Difficulty in walking, not elsewhere classified (R26.2);Unsteadiness on feet (R26.81)     Time: 4103-0131 PT Time Calculation (min) (ACUTE ONLY): 33 min  Charges:  $Therapeutic Activity: 8-22 mins               Mod Eval charge    Windell Norfolk, DPT, PN2   Supplemental Physical Therapist [place]    Pager [phone] Acute  Rehab Office [phone]

## 2020-10-23 NOTE — Progress Notes (Signed)
PHARMACY - PHYSICIAN COMMUNICATION CRITICAL VALUE ALERT - BLOOD CULTURE IDENTIFICATION (BCID)  [name] is an 80 y.o. female who presented to [facility] on 10/22/2020 with a chief complaint of sepsis/UTI.   Assessment:  WBC WNL. Tmax was 104.5 (last temp was 98).   Name of physician (or Provider) Contacted: Dr. Sunnie Nielsen  Current antibiotics: Ceftriaxone 1g IV q24h  Changes to prescribed antibiotics recommended:  Increase Ceftriaxone to 2g IV q24h  Results for orders placed or performed during the hospital encounter of 10/22/20  Blood Culture ID Panel (Reflexed) (Collected: 10/22/2020 12:39 PM)  Result Value Ref Range   Enterococcus faecalis NOT DETECTED NOT DETECTED   Enterococcus Faecium NOT DETECTED NOT DETECTED   Listeria monocytogenes NOT DETECTED NOT DETECTED   Staphylococcus species NOT DETECTED NOT DETECTED   Staphylococcus aureus (BCID) NOT DETECTED NOT DETECTED   Staphylococcus epidermidis NOT DETECTED NOT DETECTED   Staphylococcus lugdunensis NOT DETECTED NOT DETECTED   Streptococcus species NOT DETECTED NOT DETECTED   Streptococcus agalactiae NOT DETECTED NOT DETECTED   Streptococcus pneumoniae NOT DETECTED NOT DETECTED   Streptococcus pyogenes NOT DETECTED NOT DETECTED   A.calcoaceticus-baumannii NOT DETECTED NOT DETECTED   Bacteroides fragilis NOT DETECTED NOT DETECTED   Enterobacterales DETECTED (A) NOT DETECTED   Enterobacter cloacae complex NOT DETECTED NOT DETECTED   Escherichia coli DETECTED (A) NOT DETECTED   Klebsiella aerogenes NOT DETECTED NOT DETECTED   Klebsiella oxytoca NOT DETECTED NOT DETECTED   Klebsiella pneumoniae NOT DETECTED NOT DETECTED   Proteus species NOT DETECTED NOT DETECTED   Salmonella species NOT DETECTED NOT DETECTED   Serratia marcescens NOT DETECTED NOT DETECTED   Haemophilus influenzae NOT DETECTED NOT DETECTED   Neisseria meningitidis NOT DETECTED NOT DETECTED   Pseudomonas aeruginosa NOT DETECTED NOT DETECTED    Stenotrophomonas maltophilia NOT DETECTED NOT DETECTED   Candida albicans NOT DETECTED NOT DETECTED   Candida auris NOT DETECTED NOT DETECTED   Candida glabrata NOT DETECTED NOT DETECTED   Candida krusei NOT DETECTED NOT DETECTED   Candida parapsilosis NOT DETECTED NOT DETECTED   Candida tropicalis NOT DETECTED NOT DETECTED   Cryptococcus neoformans/gattii NOT DETECTED NOT DETECTED   CTX-M ESBL NOT DETECTED NOT DETECTED   Carbapenem resistance IMP NOT DETECTED NOT DETECTED   Carbapenem resistance KPC NOT DETECTED NOT DETECTED   Carbapenem resistance NDM NOT DETECTED NOT DETECTED   Carbapenem resist OXA 48 LIKE NOT DETECTED NOT DETECTED   Carbapenem resistance VIM NOT DETECTED NOT DETECTED    Abran Duke 10/23/2020  7:13 AM

## 2020-10-23 NOTE — ED Notes (Signed)
Took over patient care, Iv running at 177ml/hr LR

## 2020-10-23 NOTE — Progress Notes (Addendum)
PROGRESS NOTE    ABBEYGAIL Fernandez  OHY:[id number] DOB: 11/23/1940 DOA: 10/22/2020 PCP: Arnette Felts, FNP   Brief Narrative: 80 year old with past medical history significant for diastolic heart failure, fibromyalgia, gait abnormality who was found altered by family member.  Patient report falling off of the bed while sleeping the day prior to admission around 5:30 AM.  Patient might have has hit her head and sustained a small bruise in her tailbone.  Over the weekend patient has also had intermittent slurred speech.  Patient has been noted to be confused by her family.  Evaluation in the ED patient was found to be febrile temperature of 104.5.  Urinalysis consistent with UTI.  Blood cultures now positive for E. coli.   Assessment & Plan:   Principal Problem:   Sepsis secondary to UTI [facility]) Active Problems:   Fibromyalgia   Depression   Hypokalemia   Dysarthria   Acute metabolic encephalopathy  1-Sepsis: Secondary to UTI Patient presents with altered mental status, fevers 104, tachycardia and tachypnea. Source of infection secondary to urinary tract infection, UA with positive nitrates, 21-50 white blood cell. -Was initially started on broad-spectrum antibiotics. -Sequently transition to IV ceftriaxone. -Blood cultures growing E. coli. -Continue With IV antibiotics and IV fluids.  2-Acute Metabolic Encephalopathy/Dysarthria: -Patient reported to be confused and slurred  speech, intermittently over the weekend. -Likely secondary to fevers and infectious process -CT head: No acute intracranial abnormality. -if persist will need MRI.   3-Fall: PT OT consulted  Hypokalemia:replete orally.  Metabolic acidosis; continue with IV fluids.   thrombocytopenia: In the setting of infectious process  Migraine headache: Continue Topamax  Depression: Continue with Zoloft  Fibromyalgia: Continue with Lyrica    Estimated body mass index is 25.73 kg/m as calculated from the  following:   Height as of this encounter: 5\' 4"  (1.626 m).   Weight as of this encounter: 68 kg.   DVT prophylaxis: Lovenox, monitor platelet count.  Code Status: Full Code Family Communication: Daughter over phone.  Disposition Plan:  Status is: Inpatient  Remains inpatient appropriate because:IV treatments appropriate due to intensity of illness or inability to take PO  Dispo: The patient is from: Home              Anticipated d/c is to:  to be determine              Patient currently is not medically stable to d/c.   Difficult to place patient No        Consultants:  None  Procedures:  None  Antimicrobials:    Subjective: She is alert, her speech is clear. She was able to tell me her name and that she was in the hospital.  Denies abdominal pain   Objective: Vitals:   10/23/20 0430 10/23/20 0500 10/23/20 0530 10/23/20 0600  BP: (!) 98/45 (!) 86/47 (!) 93/47 (!) 91/58  Pulse: 82 78 78 79  Resp: (!) 24 (!) 24 (!) 21 (!) 23  Temp:   98 F (36.7 C)   TempSrc:   Oral   SpO2: (!) 88% (!) 88% 93% 96%  Weight:      Height:        Intake/Output Summary (Last 24 hours) at 10/23/2020 0708 Last data filed at 10/23/2020 0037 Gross per 24 hour  Intake 3079.17 ml  Output --  Net 3079.17 ml   Filed Weights   10/22/20 1300  Weight: 68 kg    Examination:  General exam: Appears calm and comfortable  Respiratory system: Clear to auscultation. Respiratory effort normal. Cardiovascular system: S1 & S2 heard, RRR. No JVD, murmurs, rubs, gallops or clicks. No pedal edema. Gastrointestinal system: Abdomen is nondistended, soft and nontender. No organomegaly or masses felt. Normal bowel sounds heard. Central nervous system: Alert follows command Extremities: Symmetric 5 x 5 power.   Data Reviewed: I have personally reviewed following labs and imaging studies  CBC: Recent Labs  Lab 10/22/20 1245 10/22/20 1255 10/23/20 0204  WBC 5.7  --  4.1  NEUTROABS 5.5  --    --   HGB 12.8 11.9* 11.4*  HCT 39.0 35.0* 34.5*  MCV 94.0  --  93.0  PLT 127*  --  101*   Basic Metabolic Panel: Recent Labs  Lab 10/22/20 1245 10/22/20 1255 10/23/20 0204  NA 135 137 136  K 3.1* 3.1* 3.1*  CL 106  --  108  CO2 18*  --  19*  GLUCOSE 138*  --  112*  BUN 22  --  19  CREATININE 1.09*  --  0.96  CALCIUM 8.3*  --  7.9*   GFR: Estimated Creatinine Clearance: 45.7 mL/min (by C-G formula based on SCr of 0.96 mg/dL). Liver Function Tests: Recent Labs  Lab 10/22/20 1245  AST 61*  ALT 21  ALKPHOS 66  BILITOT 1.0  PROT 5.8*  ALBUMIN 3.0*   Recent Labs  Lab 10/22/20 1245  LIPASE 21   No results for input(s): AMMONIA in the last 168 hours. Coagulation Profile: Recent Labs  Lab 10/22/20 1245  INR 1.3*   Cardiac Enzymes: No results for input(s): CKTOTAL, CKMB, CKMBINDEX, TROPONINI in the last 168 hours. BNP (last 3 results) No results for input(s): PROBNP in the last 8760 hours. HbA1C: No results for input(s): HGBA1C in the last 72 hours. CBG: No results for input(s): GLUCAP in the last 168 hours. Lipid Profile: No results for input(s): CHOL, HDL, LDLCALC, TRIG, CHOLHDL, LDLDIRECT in the last 72 hours. Thyroid Function Tests: No results for input(s): TSH, T4TOTAL, FREET4, T3FREE, THYROIDAB in the last 72 hours. Anemia Panel: No results for input(s): VITAMINB12, FOLATE, FERRITIN, TIBC, IRON, RETICCTPCT in the last 72 hours. Sepsis Labs: Recent Labs  Lab 10/22/20 1233 10/22/20 1440  LATICACIDVEN 1.5 1.4    Recent Results (from the past 240 hour(s))  Blood Culture (routine x 2)     Status: None (Preliminary result)   Collection Time: 10/22/20 12:39 PM   Specimen: BLOOD RIGHT HAND  Result Value Ref Range Status   Specimen Description BLOOD RIGHT HAND  Final   Special Requests   Final    BOTTLES DRAWN AEROBIC AND ANAEROBIC Blood Culture results may not be optimal due to an inadequate volume of blood received in culture bottles   Culture  Setup  Time   Final    GRAM NEGATIVE RODS ANAEROBIC BOTTLE ONLY Organism ID to follow Performed at [facility], [address], [place], [place] [address]    Culture GRAM NEGATIVE RODS  Final   Report Status PENDING  Incomplete  Resp Panel by RT-PCR (Flu A&B, Covid) Nasopharyngeal Swab     Status: None   Collection Time: 10/22/20  1:05 PM   Specimen: Nasopharyngeal Swab; Nasopharyngeal(NP) swabs in vial transport medium  Result Value Ref Range Status   SARS Coronavirus 2 by RT PCR NEGATIVE NEGATIVE Final    Comment: (NOTE) SARS-CoV-2 target nucleic acids are NOT DETECTED.  The SARS-CoV-2 RNA is generally detectable in upper respiratory specimens during the acute phase of  infection. The lowest concentration of SARS-CoV-2 viral copies this assay can detect is 138 copies/mL. A negative result does not preclude SARS-Cov-2 infection and should not be used as the sole basis for treatment or other patient management decisions. A negative result may occur with  improper specimen collection/handling, submission of specimen other than nasopharyngeal swab, presence of viral mutation(s) within the areas targeted by this assay, and inadequate number of viral copies(<138 copies/mL). A negative result must be combined with clinical observations, patient history, and epidemiological information. The expected result is Negative.  Fact Sheet for Patients:  BloggerCourse.com  Fact Sheet for Healthcare Providers:  SeriousBroker.it  This test is no t yet approved or cleared by the [place] FDA and  has been authorized for detection and/or diagnosis of SARS-CoV-2 by FDA under an Emergency Use Authorization (EUA). This EUA will remain  in effect (meaning this test can be used) for the duration of the COVID-19 declaration under Section 564(b)(1) of the Act, 21 U.S.C.section 360bbb-3(b)(1), unless the authorization is terminated  or revoked  sooner.       Influenza A by PCR NEGATIVE NEGATIVE Final   Influenza B by PCR NEGATIVE NEGATIVE Final    Comment: (NOTE) The Xpert Xpress SARS-CoV-2/FLU/RSV plus assay is intended as an aid in the diagnosis of influenza from Nasopharyngeal swab specimens and should not be used as a sole basis for treatment. Nasal washings and aspirates are unacceptable for Xpert Xpress SARS-CoV-2/FLU/RSV testing.  Fact Sheet for Patients: BloggerCourse.com  Fact Sheet for Healthcare Providers: SeriousBroker.it  This test is not yet approved or cleared by the [place] FDA and has been authorized for detection and/or diagnosis of SARS-CoV-2 by FDA under an Emergency Use Authorization (EUA). This EUA will remain in effect (meaning this test can be used) for the duration of the COVID-19 declaration under Section 564(b)(1) of the Act, 21 U.S.C. section 360bbb-3(b)(1), unless the authorization is terminated or revoked.  Performed at [facility], [address], [place], [place] [address]          Radiology Studies: CT HEAD WO CONTRAST ( )  Result Date: 10/22/2020 CLINICAL DATA:  Altered mental status with fever EXAM: CT HEAD WITHOUT CONTRAST TECHNIQUE: Contiguous axial images were obtained from the base of the skull through the vertex without intravenous contrast. COMPARISON:  CT brain 05/17/2020 FINDINGS: Brain: No acute territorial infarction, hemorrhage or intracranial mass. Patchy white matter hypodensity with chronic small vessel ischemic change. Nonenlarged ventricles. Vascular: No hyperdense vessel.  No unexpected calcification Skull: Normal. Negative for fracture or focal lesion. Sinuses/Orbits: No acute finding. Other: None IMPRESSION: 1. No CT evidence for acute intracranial abnormality. 2. Mild chronic small vessel ischemic change of the white matter Electronically Signed   By: Jasmine Pang M.D.   On: 10/22/2020 18:34   DG  Chest Port 1 View  Result Date: 10/22/2020 CLINICAL DATA:  Questionable sepsis EXAM: PORTABLE CHEST 1 VIEW COMPARISON:  Chest radiograph 01/12/2020 FINDINGS: The heart is at the upper limits of normal size, and the mediastinum appears prominent, both likely exaggerated by AP technique and rightward patient rotation. Lung volumes are low. There is no congestion without over pulmonary edema. There is no focal consolidation. There is no pleural effusion. There is no pneumothorax. There is no acute osseous abnormality. There is degenerative change of both shoulders. IMPRESSION: Borderline cardiomegaly with vascular congestion but no overt pulmonary edema. Electronically Signed   By: Lesia Hausen M.D.   On: 10/22/2020 13:04  Scheduled Meds:  acetaminophen  650 mg Rectal Once   clopidogrel  75 mg Oral Daily   enoxaparin (LOVENOX) injection  40 mg Subcutaneous QHS   loratadine  10 mg Oral Daily   pregabalin  25 mg Oral TID   sertraline  50 mg Oral Daily   sodium chloride flush  3 mL Intravenous Q12H   topiramate  25-75 mg Oral BID   Continuous Infusions:  cefTRIAXone (ROCEPHIN)  IV Stopped (10/22/20 2251)   lactated ringers 100 mL (10/23/20 0345)     LOS: 1 day    Time spent: 35 minutes    Beanca Kiester A Ceniyah Thorp, MD [facility]   If 7PM-7AM, please contact night-coverage [email or web address]  10/23/2020, 7:08 AM

## 2020-10-24 ENCOUNTER — Inpatient Hospital Stay (HOSPITAL_COMMUNITY): Payer: Medicare Other

## 2020-10-24 LAB — BASIC METABOLIC PANEL
Anion gap: 5 (ref 5–15)
BUN: 16 mg/dL (ref 8–23)
CO2: 23 mmol/L (ref 22–32)
Calcium: 8.2 mg/dL — ABNORMAL LOW (ref 8.9–10.3)
Chloride: 109 mmol/L (ref 98–111)
Creatinine, Ser: 0.8 mg/dL (ref 0.44–1.00)
GFR, Estimated: >60 mL/min (ref >60)
Glucose, Bld: 104 mg/dL — ABNORMAL HIGH (ref 70–99)
Potassium: 3.4 mmol/L — ABNORMAL LOW (ref 3.5–5.1)
Sodium: 137 mmol/L (ref 135–145)

## 2020-10-24 LAB — CBC
HCT: 31.1 % — ABNORMAL LOW (ref 36.0–46.0)
Hemoglobin: 10.4 g/dL — ABNORMAL LOW (ref 12.0–15.0)
MCH: 30.5 pg (ref 26.0–34.0)
MCHC: 33.4 g/dL (ref 30.0–36.0)
MCV: 91.2 fL (ref 80.0–100.0)
Platelets: 112 10*3/uL — ABNORMAL LOW (ref 150–400)
RBC: 3.41 MIL/uL — ABNORMAL LOW (ref 3.87–5.11)
RDW: 13.3 % (ref 11.5–15.5)
WBC: 6.4 10*3/uL (ref 4.0–10.5)
nRBC: 0 % (ref 0.0–0.2)

## 2020-10-24 LAB — URINE CULTURE: Culture: >=100000 — AB

## 2020-10-24 LAB — VITAMIN B12: Vitamin B-12: 241 pg/mL (ref 180–914)

## 2020-10-24 LAB — TSH: TSH: 5.317 u[IU]/mL — ABNORMAL HIGH (ref 0.350–4.500)

## 2020-10-24 LAB — MAGNESIUM: Magnesium: 1.7 mg/dL (ref 1.7–2.4)

## 2020-10-24 LAB — T4, FREE: Free T4: 0.95 ng/dL (ref 0.61–1.12)

## 2020-10-24 MED ORDER — POTASSIUM CHLORIDE CRYS ER 20 MEQ PO TBCR
20.0000 meq | EXTENDED_RELEASE_TABLET | Freq: Two times a day (BID) | ORAL | Status: DC
  Administered 2020-10-24 – 2020-10-25 (×3): 20 meq via ORAL
  Filled 2020-10-24 (×3): qty 1

## 2020-10-24 NOTE — Consult Note (Signed)
[facility]  Total days of antibiotics 3               Reason for Consult: e.coli bacteremia    Referring Physician: Dr. Jonathon BellowsKC  Principal Problem:   Sepsis secondary to UTI [facility](HCC) Active Problems:   Fibromyalgia   Depression   Hypokalemia   Dysarthria   Acute metabolic encephalopathy    HPI: [name] is a 80 y.o. female who was found down at home on 8/15- where she sustained head injury, possibly for roughly > 10hrs. She remained confused. In the ED, found to have fever of 104F, mild hypotension, UA with pyuria and bacturia. She was initially started on broad spectrum abtx for presumed sepsis of urinary origin. Her infectious work up revealed both urine and blood cx + pan-sensitive ecoli infection. She now is afebrile, more alert. Denies nausea and vomiting, but did have 3 bM over last 24hrs. No abdominal pain. Past Medical History:  Diagnosis Date   Abnormality of gait 04/10/2015   Cervical spondylosis without myelopathy 07/18/2013   Common migraine with intractable migraine 04/10/2015   DJD (degenerative joint disease)    Fibromyalgia    Hiatal hernia    Migraine headache    Vitreomacular adhesion of right eye 03/08/2020    Allergies:  Allergies  Allergen Reactions   Aspirin Anaphylaxis and Hives   Elavil [Amitriptyline] Anaphylaxis   Latex    Relafen [Nabumetone] Hives     MEDICATIONS:  acetaminophen  650 mg Rectal Once   clopidogrel  75 mg Oral Daily   enoxaparin (LOVENOX) injection  40 mg Subcutaneous QHS   loratadine  10 mg Oral Daily   potassium chloride  20 mEq Oral BID   pregabalin  25 mg Oral TID   sertraline  50 mg Oral Daily   sodium chloride flush  3 mL Intravenous Q12H   topiramate  25-75 mg Oral BID    Social History   Tobacco Use   Smoking status: Never   Smokeless tobacco: Never  Vaping Use   Vaping Use: Never used  Substance Use Topics   Alcohol use: No    Comment: very occasional   Drug use: No     Family History  Problem Relation Age of Onset   CVA Mother    Stroke Father    Emphysema Father      Review of Systems  Constitutional: positive for fever, chills, diaphoresis, activity change, appetite change, fatigue and unexpected weight change.  HENT: Negative for congestion, sore throat, rhinorrhea, sneezing, trouble swallowing and sinus pressure.  Eyes: Negative for photophobia and visual disturbance.  Respiratory: Negative for cough, chest tightness, shortness of breath, wheezing and stridor.  Cardiovascular: Negative for chest pain, palpitations and leg swelling.  Gastrointestinal: Negative for nausea, vomiting, abdominal pain, diarrhea, constipation, blood in stool, abdominal distention and anal bleeding.  Genitourinary: Negative for dysuria, hematuria, flank pain and difficulty urinating.  Musculoskeletal: Negative for myalgias, back pain, joint swelling, arthralgias and gait problem.  Skin: Negative for color change, pallor, rash and wound.  Neurological: Negative for dizziness, tremors, weakness and light-headedness.  Hematological: Negative for adenopathy. Does not bruise/bleed easily.  Psychiatric/Behavioral: Negative for behavioral problems, confusion, sleep disturbance, dysphoric mood, decreased concentration and agitation.    OBJECTIVE: Temp:  [98.1 F (36.7 C)-98.8 F (37.1 C)] 98.1 F (36.7 C) (08/17 1216) Pulse Rate:  [60-73] 69 (08/17 1216) Resp:  [16-18] 18 (08/17 1216) BP: (108-118)/(48-68) 114/53 (08/17 1216) SpO2:  [94 %-  97 %] 97 % (08/17 1216) Physical Exam  Constitutional:  oriented to person, place, and time. appears well-developed and well-nourished. No distress.  HENT: [place]/AT, PERRLA, no scleral icterus Mouth/Throat: Oropharynx is clear and moist. No oropharyngeal exudate.  Cardiovascular: Normal rate, regular rhythm and normal heart sounds. Exam reveals no gallop and no friction rub.  No murmur heard.  Pulmonary/Chest: Effort normal and breath  sounds normal. No respiratory distress.  has no wheezes.  Neck = supple, no nuchal rigidity Abdominal: Soft. Bowel sounds are normal.  exhibits no distension. There is no tenderness.  Lymphadenopathy: no cervical adenopathy. No axillary adenopathy Neurological: alert and oriented to person, place, and time.  Skin: Skin is warm and dry. No rash noted. No erythema.  Psychiatric: a normal mood and affect.  behavior is normal.    LABS: Results for orders placed or performed during the hospital encounter of 10/22/20 (from the past 48 hour(s))  CBC     Status: Abnormal   Collection Time: 10/23/20  2:04 AM  Result Value Ref Range   WBC 4.1 4.0 - 10.5 K/uL   RBC 3.71 (L) 3.87 - 5.11 MIL/uL   Hemoglobin 11.4 (L) 12.0 - 15.0 g/dL   HCT 26.8 (L) 34.1 - 96.2 %   MCV 93.0 80.0 - 100.0 fL   MCH 30.7 26.0 - 34.0 pg   MCHC 33.0 30.0 - 36.0 g/dL   RDW 22.9 79.8 - 92.1 %   Platelets 101 (L) 150 - 400 K/uL    Comment: Immature Platelet Fraction may be clinically indicated, consider ordering this additional test [id number] REPEATED TO VERIFY PLATELET COUNT CONFIRMED BY SMEAR    nRBC 0.0 0.0 - 0.2 %    Comment: Performed at [facility], [address] [place], [place] [address]  Basic metabolic panel     Status: Abnormal   Collection Time: 10/23/20  2:04 AM  Result Value Ref Range   Sodium 136 135 - 145 mmol/L   Potassium 3.1 (L) 3.5 - 5.1 mmol/L   Chloride 108 98 - 111 mmol/L   CO2 19 (L) 22 - 32 mmol/L   Glucose, Bld 112 (H) 70 - 99 mg/dL    Comment: Glucose reference range applies only to samples taken after fasting for at least 8 hours.   BUN 19 8 - 23 mg/dL   Creatinine, Ser 8.56 0.44 - 1.00 mg/dL   Calcium 7.9 (L) 8.9 - 10.3 mg/dL   GFR, Estimated >31 >49 mL/min    Comment: (NOTE) Calculated using the CKD-EPI Creatinine Equation (2021)    Anion gap 9 5 - 15    Comment: Performed at [facility], [address], [place], [place] [address]  CBC     Status: Abnormal    Collection Time: 10/24/20 12:21 AM  Result Value Ref Range   WBC 6.4 4.0 - 10.5 K/uL   RBC 3.41 (L) 3.87 - 5.11 MIL/uL   Hemoglobin 10.4 (L) 12.0 - 15.0 g/dL   HCT 78.5 (L) 88.5 - 02.7 %   MCV 91.2 80.0 - 100.0 fL   MCH 30.5 26.0 - 34.0 pg   MCHC 33.4 30.0 - 36.0 g/dL   RDW 74.1 28.7 - 86.7 %   Platelets 112 (L) 150 - 400 K/uL    Comment: Immature Platelet Fraction may be clinically indicated, consider ordering this additional test [id number] CONSISTENT WITH PREVIOUS RESULT REPEATED TO VERIFY    nRBC 0.0 0.0 - 0.2 %    Comment: Performed at  [facility], [address] [place] [place], [place] [address]  Basic metabolic panel     Status: Abnormal   Collection Time: 10/24/20 12:21 AM  Result Value Ref Range   Sodium 137 135 - 145 mmol/L   Potassium 3.4 (L) 3.5 - 5.1 mmol/L   Chloride 109 98 - 111 mmol/L   CO2 23 22 - 32 mmol/L   Glucose, Bld 104 (H) 70 - 99 mg/dL    Comment: Glucose reference range applies only to samples taken after fasting for at least 8 hours.   BUN 16 8 - 23 mg/dL   Creatinine, Ser 7.85 0.44 - 1.00 mg/dL   Calcium 8.2 (L) 8.9 - 10.3 mg/dL   GFR, Estimated >88 >50 mL/min    Comment: (NOTE) Calculated using the CKD-EPI Creatinine Equation (2021)    Anion gap 5 5 - 15    Comment: Performed at [facility], [address], [place], [place] [address]  Magnesium     Status: None   Collection Time: 10/24/20 12:21 AM  Result Value Ref Range   Magnesium 1.7 1.7 - 2.4 mg/dL    Comment: Performed at [facility], [address], [place], [place] [address]  Vitamin B12     Status: None   Collection Time: 10/24/20 12:21 AM  Result Value Ref Range   Vitamin B-12 241 180 - 914 pg/mL    Comment: (NOTE) This assay is not validated for testing neonatal or myeloproliferative syndrome specimens for Vitamin B12 levels. Performed at [facility], [address], [place], [place] [address]   TSH     Status: Abnormal   Collection Time: 10/24/20 12:21  AM  Result Value Ref Range   TSH 5.317 (H) 0.350 - 4.500 uIU/mL    Comment: Performed by a 3rd Generation assay with a functional sensitivity of <=0.01 uIU/mL. Performed at [facility], [address] [place], [place] [address]   T4, free     Status: None   Collection Time: 10/24/20 12:21 AM  Result Value Ref Range   Free T4 0.95 0.61 - 1.12 ng/dL    Comment: (NOTE) Biotin ingestion may interfere with free T4 tests. If the results are inconsistent with the TSH level, previous test results, or the clinical presentation, then consider biotin interference. If needed, order repeat testing after stopping biotin. Performed at [facility], [address], [place], [place] [address]     MICRO: 8/15 blood cx Ivan Croft 8/15 urine cx ecoli IMAGING: CT HEAD WO CONTRAST ( )  Result Date: 10/22/2020 CLINICAL DATA:  Altered mental status with fever EXAM: CT HEAD WITHOUT CONTRAST TECHNIQUE: Contiguous axial images were obtained from the base of the skull through the vertex without intravenous contrast. COMPARISON:  CT brain 05/17/2020 FINDINGS: Brain: No acute territorial infarction, hemorrhage or intracranial mass. Patchy white matter hypodensity with chronic small vessel ischemic change. Nonenlarged ventricles. Vascular: No hyperdense vessel.  No unexpected calcification Skull: Normal. Negative for fracture or focal lesion. Sinuses/Orbits: No acute finding. Other: None IMPRESSION: 1. No CT evidence for acute intracranial abnormality. 2. Mild chronic small vessel ischemic change of the white matter Electronically Signed   By: Jasmine Pang M.D.   On: 10/22/2020 18:34   US RENAL  Result Date: 10/24/2020 CLINICAL DATA:  UTI/sepsis. EXAM: RENAL / URINARY TRACT ULTRASOUND COMPLETE COMPARISON:  Abdominal ultrasound dated November 04, 2019. FINDINGS: Right Kidney: Renal measurements: 10.1 x 4.5 x 5.7 cm = volume: 155 mL.  Echogenicity within normal limits. No mass or hydronephrosis visualized.  Left Kidney: Renal measurements: 11.1 x 4.7 x 5.6 cm = volume: 154 mL. Echogenicity within normal limits. No mass or hydronephrosis visualized. Bladder: Appears normal for degree of bladder distention. Other: None. IMPRESSION: 1. Normal renal ultrasound. Electronically Signed   By: Obie Dredge M.D.   On: 10/24/2020 12:03    HISTORICAL MICRO/IMAGING  Assessment/Plan:  80yo F with admitted for confusion, loss of consciousness, hypotension, fever found to have presumed pyelonephritis with ecoli bacteremia. Improving on day 3 of iv abtx  - continue on ceftriaxone 2gm IV daily - if continues to be afebrile, recommend to switch to oral abtx tomorrow after susceptibility returns. Suspect that it will be the same isolate as in the urine cx being amp sensitive - thus she maybe able to be converted to amoxicillin for the remaining part of the course  Confusion = appears improved, since she was able to recount her presentation and hospitalization  Diarrhea = will watch to see if it worsens. For the time being, this does not appear to be consistent with cdifficile. Would not recommend testing at this time.

## 2020-10-24 NOTE — Progress Notes (Signed)
Patient suffers from chronic dizziness, sepsis/UTI which impairs their ability to perform daily activities like ambulate safely in the home.  A walker alone will not resolve the issues with performing activities of daily living. A wheelchair will allow patient to safely perform daily activities.  The patient can self propel in the home or has a caregiver who can provide assistance.   Corinna Capra, PT, DPT  Acute Rehabilitation Ortho Tech Supervisor [phone] pager [phone] office

## 2020-10-24 NOTE — TOC Initial Note (Signed)
Transition of Care [facility]) - Initial/Assessment Note    Patient Details  Name: [name] MRN: [id number] Date of Birth: 06-06-1940  [facility]) CM/SW Contact:    Kingsley Plan, RN Phone Number: 10/24/2020, 5:01 PM  Clinical Narrative:                 Bedelia Person to patient and daughter [name] and daughter in law at bedside.   Patient and family denied SNF. Patient prefers to go home at discharge. [name] states family will provide 24/7 supervision at home.   Patient has cane and walker already at home.   They would like a small wheel chair for home. Discussed with PT who recommends a 18 inch by 18 inch wheel chair. [name] will measure door ways tonight. NCM will call [name] at [phone] tomorrow. NCM explained insurance does go by height and weight also for wheel chair.   Patient will also need 3 in1. NCM will order wheel chair and 3 in1 tomorrow after family measures door ways.   Patient and family have no preference for home health agency. Referral for HHPT and OT accepted by [facility] with [facility].   Expected Discharge Plan: Home w Home Health Services Barriers to Discharge: Continued Medical Work up   Patient Goals and CMS Choice Patient states their goals for this hospitalization and ongoing recovery are:: to return to home CMS Medicare.gov Compare Post Acute Care list provided to:: Patient Choice offered to / list presented to : Patient, Adult Children  Expected Discharge Plan and Services Expected Discharge Plan: Home w Home Health Services     Post Acute Care Choice: Home Health Living arrangements for the past 2 months: Single Family Home                 DME Arranged: 3-N-1, Wheelchair manual DME Agency: [employer]       HH Arranged: PT, OT HH Agency: [facility] Date [facility] Agency Contacted: 10/24/20 Time HH Agency Contacted: 1700 Representative spoke with at [facility] Agency: Kandee Keen  Prior Living Arrangements/Services Living arrangements for  the past 2 months: Single Family Home Lives with:: Adult Children Patient language and need for interpreter reviewed:: Yes Do you feel safe going back to the place where you live?: Yes      Need for Family Participation in Patient Care: Yes (Comment) Care giver support system in place?: Yes (comment) Current home services: DME Criminal Activity/Legal Involvement Pertinent to Current Situation/Hospitalization: No - Comment as needed  Activities of Daily Living      Permission Sought/Granted Permission sought to share information with : Family Supports Permission granted to share information with : Yes, Verbal Permission Granted  Share Information with NAME: daughhter Orbie Hurst  Permission granted to share info w AGENCY: [facility] , [employer]  Permission granted to share info w Relationship: Daughters     Emotional Assessment Appearance:: Appears stated age Attitude/Demeanor/Rapport: Engaged Affect (typically observed): Accepting Orientation: : Oriented to Self, Oriented to Place, Oriented to  Time, Oriented to Situation Alcohol / Substance Use: Not Applicable Psych Involvement: No (comment)  Admission diagnosis:  Sepsis secondary to UTI (HCC) [A41.9, N39.0] Urinary tract infection without hematuria, site unspecified [N39.0] Sepsis, due to unspecified organism, unspecified whether acute organ dysfunction present [facility]) [A41.9] Patient Active Problem List   Diagnosis Date Noted   Sepsis secondary to UTI (HCC) 10/22/2020   Hypokalemia 10/22/2020   Dysarthria 10/22/2020   Acute metabolic encephalopathy 10/22/2020   Posterior vitreous detachment  of left eye 01/19/2020   Exudative age-related macular degeneration of right eye with active choroidal neovascularization (HCC) 10/26/2019   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 10/26/2019   Intermediate stage nonexudative age-related macular degeneration of both eyes 10/26/2019   Vitamin D deficiency 06/28/2019   Abnormal glucose  10/21/2018   History of TIA (transient ischemic attack) 06/17/2018   Recurrent major depressive disorder, in partial remission (HCC) 06/17/2018   Seasonal allergies 06/17/2018   Depression 02/11/2018   Hiatal hernia 02/11/2018   Dysphagia 02/11/2018   Hyperkalemia 02/11/2018   Abnormality of gait 04/10/2015   Cervical spondylosis without myelopathy 07/18/2013   Left-sided weakness 05/29/2013   Left sided numbness 05/29/2013   Headache 05/29/2013   TIA (transient ischemic attack) 05/29/2013   Neck pain on left side 05/29/2013   Fibromyalgia    PCP:  Arnette Felts, FNP Pharmacy:   [id number] - [place], [place] - [address] AT [facility] [address]  [place] [address] Phone: [phone] Fax: [phone]     Social Determinants of Health (SDOH) Interventions    Readmission Risk Interventions No flowsheet data found.

## 2020-10-24 NOTE — Progress Notes (Addendum)
Physical Therapy Treatment Patient Details Name: ANDA SOBOTTA MRN: [id number] DOB: 07/01/1940 Today's Date: 10/24/2020    History of Present Illness 80yo female admitted 10/22/20 after falling OOB and hitting her head at 530a; family not able to get to her until 1830 that evening. Hit head and her tailbone area in the fall. Had increased AMS and family decided to bring her to the ED. Found to be in sepsis secondary to UTI and with acute metabolic encephalopathy. PMH fibromyalgia, DJD    PT Comments    Daughter and daughter in law in room observing session, reporting they can provide 24/7 care at d/c.  Pt able to get up and walk around the room with min guard assist and RW.  Self limiting on distance and reports she gets dizzy "sometimes" but not currently.  She would benefit from a WC for home mobility on days she is dizzy to keep her safer while attempting to get up and around her home independently.  PT will continue to follow acutely for safe mobility progression.       Follow Up Recommendations  Home health PT     Equipment Recommendations  3in1 (PT);Wheelchair (measurements PT);Wheelchair cushion (measurements PT) (18x18 WC--to fit through doors at home)    Recommendations for Other Services       Precautions / Restrictions Precautions Precautions: Fall    Mobility  Bed Mobility Overal bed mobility: Needs Assistance Bed Mobility: Supine to Sit     Supine to sit: Supervision;HOB elevated (mildly elevated) Sit to supine: Supervision;HOB elevated   General bed mobility comments: Pt was able to get herself up (even on the wrong side of the bed, reporting she normally goes left at home and we went right) with supervision, same to return to supine where she used her hands to help lift right leg back into bed.    Transfers Overall transfer level: Needs assistance Equipment used: Rolling walker (2 wheeled) Transfers: Sit to/from Stand Sit to Stand: Min guard          General transfer comment: Min guard assist for safety cues for making sure not to park the RW a few feet away before sitting down  Ambulation/Gait Ambulation/Gait assistance: Min guard Gait Distance (Feet): 40 Feet Assistive device: Rolling walker (2 wheeled) Gait Pattern/deviations: Step-through pattern     General Gait Details: good speed, cues to stand closer to the RW (pushed out ahead too far).   Stairs             Wheelchair Mobility    Modified Rankin (Stroke Patients Only)       Balance Overall balance assessment: Needs assistance Sitting-balance support: Feet supported;No upper extremity supported Sitting balance-Leahy Scale: Good     Standing balance support: Bilateral upper extremity supported;Single extremity supported;No upper extremity supported Standing balance-Leahy Scale: Fair Standing balance comment: close supervision while pulling up underwear after toileting.                            Cognition Arousal/Alertness: Awake/alert Behavior During Therapy: WFL for tasks assessed/performed Overall Cognitive Status: Impaired/Different from baseline Area of Impairment: Memory                     Memory: Decreased short-term memory         General Comments: Seems to have some STM deficits, did not ask if this is baseline, mild and functional      Exercises  General Comments General comments (skin integrity, edema, etc.): Pt did not report any dizziness today, no signs of BPPV with bed mobility or transitions up and down from sup<->sit, pt also tilted in trendelenberg without signs of dizziness, so no formal vestibular testing preformed.      Pertinent Vitals/Pain Pain Assessment: Faces Faces Pain Scale: Hurts little more Pain Location: right leg chronic "bad leg" Pain Descriptors / Indicators: Discomfort Pain Intervention(s): Monitored during session;Limited activity within patient's tolerance;Repositioned    Home  Living                      Prior Function            PT Goals (current goals can now be found in the care plan section) Acute Rehab PT Goals Patient Stated Goal: to go home Progress towards PT goals: Progressing toward goals    Frequency    Min 3X/week      PT Plan Discharge plan needs to be updated    Co-evaluation              AM-PAC PT "6 Clicks" Mobility   Outcome Measure  Help needed turning from your back to your side while in a flat bed without using bedrails?: A Little Help needed moving from lying on your back to sitting on the side of a flat bed without using bedrails?: A Little Help needed moving to and from a bed to a chair (including a wheelchair)?: A Little Help needed standing up from a chair using your arms (e.g., wheelchair or bedside chair)?: A Little Help needed to walk in hospital room?: A Little Help needed climbing 3-5 steps with a railing? : A Little 6 Click Score: 18    End of Session   Activity Tolerance: Patient tolerated treatment well Patient left: in bed;with call bell/phone within reach;with family/visitor present   PT Visit Diagnosis: Muscle weakness (generalized) (M62.81);History of falling (Z91.81);Difficulty in walking, not elsewhere classified (R26.2);Unsteadiness on feet (R26.81)     Time: 3335-4562 PT Time Calculation (min) (ACUTE ONLY): 33 min  Charges:  $Gait Training: 8-22 mins $Therapeutic Activity: 8-22 mins                    Corinna Capra, PT, DPT  Acute Rehabilitation Ortho Tech Supervisor [phone] pager [phone]) [phone] office

## 2020-10-24 NOTE — TOC Initial Note (Signed)
Transition of Care [facility]) - Initial/Assessment Note    Patient Details  Name: [name] MRN: [id number] Date of Birth: Nov 15, 1940  Transition of Care [facility]) CM/SW Contact:    Emeterio Reeve, LCSW Phone Number: 10/24/2020, 4:58 PM  Clinical Narrative:                  CSW received SNF consult. CSW met with pt at bedside, pts two daughters where present for conversation. CSW introduced self and explained role at the hospital. Pt reports that PTA she lives at home alone but daughter has been staying with her for the past week  CSW reviewed PT/OT recommendations for SNF. Pt reports she does not go to SNF. Pts daughter stated she will continue to stay with pt and provide 24 hour support.   Pt has not had HHPT in the past but is open to it, she does not have a preference in facility. Pt reports he has multiple walkers and canes at home. Daughter reports he feels like pt needs a small wheelchair and a potty chair. CSW has informed RNCM.  CSW will continue to follow.   Expected Discharge Plan: [place] Barriers to Discharge: Continued Medical Work up   Patient Goals and CMS Choice Patient states their goals for this hospitalization and ongoing recovery are:: to get better CMS Medicare.gov Compare Post Acute Care list provided to:: Patient Choice offered to / list presented to : Patient  Expected Discharge Plan and Services Expected Discharge Plan: [place]       Living arrangements for the past 2 months: Single Family Home                                      Prior Living Arrangements/Services Living arrangements for the past 2 months: Single Family Home Lives with:: Self Patient language and need for interpreter reviewed:: Yes Do you feel safe going back to the place where you live?: Yes      Need for Family Participation in Patient Care: Yes (Comment) Care giver support system in place?: Yes (comment) Current home services:  DME Criminal Activity/Legal Involvement Pertinent to Current Situation/Hospitalization: No - Comment as needed  Activities of Daily Living      Permission Sought/Granted Permission sought to share information with : Family Supports Permission granted to share information with : Yes, Verbal Permission Granted        Permission granted to share info w Relationship: Daughters     Emotional Assessment Appearance:: Appears stated age Attitude/Demeanor/Rapport: Engaged Affect (typically observed): Appropriate Orientation: : Oriented to Self, Oriented to Place, Oriented to  Time, Oriented to Situation Alcohol / Substance Use: Not Applicable Psych Involvement: No (comment)  Admission diagnosis:  Sepsis secondary to UTI ([place]) [A41.9, N39.0] Urinary tract infection without hematuria, site unspecified [N39.0] Sepsis, due to unspecified organism, unspecified whether acute organ dysfunction present [facility]) [A41.9] Patient Active Problem List   Diagnosis Date Noted   Sepsis secondary to UTI ([place]) 10/22/2020   Hypokalemia 10/22/2020   Dysarthria 18/40/3754   Acute metabolic encephalopathy 36/08/7701   Posterior vitreous detachment of left eye 01/19/2020   Exudative age-related macular degeneration of right eye with active choroidal neovascularization ([place]) 10/26/2019   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 10/26/2019   Intermediate stage nonexudative age-related macular degeneration of both eyes 10/26/2019   Vitamin D deficiency 06/28/2019  Abnormal glucose 10/21/2018   History of TIA (transient ischemic attack) 06/17/2018   Recurrent major depressive disorder, in partial remission ([place]) 06/17/2018   Seasonal allergies 06/17/2018   Depression 02/11/2018   Hiatal hernia 02/11/2018   Dysphagia 02/11/2018   Hyperkalemia 02/11/2018   Abnormality of gait 04/10/2015   Cervical spondylosis without myelopathy 07/18/2013   Left-sided weakness 05/29/2013   Left sided numbness  05/29/2013   Headache 05/29/2013   TIA (transient ischemic attack) 05/29/2013   Neck pain on left side 05/29/2013   Fibromyalgia    PCP:  [name], FNP Pharmacy:   [id number] [place] [id number]: [phone]: [phone]    Social Determinants of Health (SDOH) Interventions    Readmission Risk Interventions No flowsheet data found.  MEmeterio Reeve LSouth CarolinaClinical Social Worker [phone]

## 2020-10-24 NOTE — Evaluation (Signed)
Occupational Therapy Evaluation Patient Details Name: [name] MRN: [id number] DOB: 21-Jun-1940 Today's Date: 10/24/2020    History of Present Illness 80yo female admitted 10/22/20 after falling OOB and hitting her head at 530a; family not able to get to her until 1830 that evening. Hit head and her tailbone area in the fall. Had increased AMS and family decided to bring her to the ED. Found to be in sepsis secondary to UTI and with acute metabolic encephalopathy. PMH fibromyalgia, DJD   Clinical Impression   Pt admitted for concerns listed above. PTA Pt reported that she was independent with all ADL's and most IADL's, using a walking cane. At this time, pt presents with increased weakness and balance deficits, requiring the use of a RW and min guard for all OOB mobility. Additionally, pt presents with decreased activity tolerance, requiring seated rest break after ambulating, 15 ft. OT will follow acutely.     Follow Up Recommendations  Home health OT;Supervision - Intermittent;Supervision/Assistance - 24 hour    Equipment Recommendations  Other (comment);Tub/shower bench (RW)    Recommendations for Other Services       Precautions / Restrictions Precautions Precautions: Fall Restrictions Weight Bearing Restrictions: No      Mobility Bed Mobility Overal bed mobility: Needs Assistance Bed Mobility: Supine to Sit     Supine to sit: Supervision;HOB elevated (mildly elevated) Sit to supine: Supervision;HOB elevated   General bed mobility comments: Pt was able to get herself up with supervision, same to return to supine where she used her hands to help lift right leg back into bed.    Transfers Overall transfer level: Needs assistance Equipment used: Rolling walker (2 wheeled) Transfers: Sit to/from Stand Sit to Stand: Min guard         General transfer comment: Min guard assist for safety cues for making sure not to park the RW a few feet away before sitting  down    Balance Overall balance assessment: Needs assistance Sitting-balance support: Feet supported;No upper extremity supported Sitting balance-Leahy Scale: Good     Standing balance support: Bilateral upper extremity supported;Single extremity supported;No upper extremity supported Standing balance-Leahy Scale: Fair Standing balance comment: close supervision while pulling up underwear after toileting.                           ADL either performed or assessed with clinical judgement   ADL Overall ADL's : Needs assistance/impaired Eating/Feeding: Independent;Sitting   Grooming: Min guard;Standing   Upper Body Bathing: Supervision/ safety;Sitting   Lower Body Bathing: Minimal assistance;Sitting/lateral leans;Sit to/from stand   Upper Body Dressing : Independent;Sitting   Lower Body Dressing: Minimal assistance;Sit to/from stand;Sitting/lateral leans   Toilet Transfer: Min guard;Stand-pivot   Toileting- Clothing Manipulation and Hygiene: Minimal assistance;Sitting/lateral lean;Sit to/from stand   Tub/ Shower Transfer: Min guard;Stand-pivot   Functional mobility during ADLs: Min guard;Rolling walker General ADL Comments: Pt overall completing ADL's at what is mostlikely her baseline. Functional mobility however is impaired by increased weakness and balance concerns. RW provides increased assist.     Vision Baseline Vision/History: Wears glasses Wears Glasses: At all times Patient Visual Report: No change from baseline Vision Assessment?: No apparent visual deficits     Perception Perception Perception Tested?: No   Praxis Praxis Praxis tested?: Not tested    Pertinent Vitals/Pain Pain Assessment: Faces Faces Pain Scale: Hurts little more Pain Location: right leg chronic "bad leg" Pain Descriptors / Indicators: Discomfort Pain Intervention(s): Monitored during  session;Repositioned     Hand Dominance Right   Extremity/Trunk Assessment Upper  Extremity Assessment Upper Extremity Assessment: Generalized weakness   Lower Extremity Assessment Lower Extremity Assessment: Defer to PT evaluation   Cervical / Trunk Assessment Cervical / Trunk Assessment: Kyphotic   Communication Communication Communication: No difficulties   Cognition Arousal/Alertness: Awake/alert Behavior During Therapy: WFL for tasks assessed/performed Overall Cognitive Status: Impaired/Different from baseline Area of Impairment: Memory                     Memory: Decreased short-term memory         General Comments: Seems to have some STM deficits, did not ask if this is baseline, mild and functional   General Comments  No dizziness this session    Exercises     Shoulder Instructions      Home Living Family/patient expects to be discharged to:: Private residence Living Arrangements: Alone Available Help at Discharge: Family;Available PRN/intermittently Type of Home: House Home Access: Stairs to enter [employer] of Steps: 3STE no rails, back steps too steep to use (does OK with cane and holding onto brick wall per family) Entrance Stairs-Rails: None Home Layout: One level     Bathroom Shower/Tub: Chief Strategy Officer: Standard Bathroom Accessibility: Yes How Accessible: Accessible via walker Home Equipment: Grab bars - tub/shower;Walker - 2 wheels;Cane - single point;Shower seat   Additional Comments: shower seat is a "house" for cats outside and not inside her home; does not have WC but might be able to borrow one from church, regardless would have issues fitting WC thru doors in her home      Prior Functioning/Environment Level of Independence: Independent with assistive device(s)        Comments: sometimes has problems moving around, sometimes good and bad days        OT Problem List: Decreased strength;Decreased activity tolerance;Impaired balance (sitting and/or standing);Decreased safety  awareness;Decreased knowledge of use of DME or AE      OT Treatment/Interventions: Self-care/ADL training;Therapeutic exercise;Energy conservation;Therapeutic activities;Balance training;Patient/family education    OT Goals(Current goals can be found in the care plan section) Acute Rehab OT Goals Patient Stated Goal: to go home OT Goal Formulation: With patient Time For Goal Achievement: 11/07/20 Potential to Achieve Goals: Good ADL Goals Pt Will Perform Grooming: with modified independence;standing Pt Will Perform Lower Body Bathing: with modified independence;sitting/lateral leans;sit to/from stand Pt Will Perform Lower Body Dressing: with modified independence;sitting/lateral leans;sit to/from stand Pt Will Transfer to Toilet: with modified independence;ambulating Pt Will Perform Toileting - Clothing Manipulation and hygiene: with modified independence;sit to/from stand;sitting/lateral leans  OT Frequency: Min 2X/week   Barriers to D/C:            Co-evaluation              AM-PAC OT "6 Clicks" Daily Activity     Outcome Measure Help from another person eating meals?: None Help from another person taking care of personal grooming?: A Little Help from another person toileting, which includes using toliet, bedpan, or urinal?: A Little Help from another person bathing (including washing, rinsing, drying)?: A Little Help from another person to put on and taking off regular upper body clothing?: None Help from another person to put on and taking off regular lower body clothing?: A Little 6 Click Score: 20   End of Session Equipment Utilized During Treatment: Gait belt;Rolling walker Nurse Communication: Mobility status  Activity Tolerance: Patient tolerated treatment well Patient left:  in bed;with call bell/phone within reach  OT Visit Diagnosis: Unsteadiness on feet (R26.81);Other abnormalities of gait and mobility (R26.89);Muscle weakness (generalized) (M62.81)                 Time: 2423-5361 OT Time Calculation (min): 39 min Charges:  OT General Charges $OT Visit: 1 Visit OT Evaluation $OT Eval Moderate Complexity: 1 Mod OT Treatments $Self Care/Home Management : 23-37 mins  Krosby Ritchie H., OTR/L Acute Rehabilitation  Janmichael Giraud Elane Thaine Garriga 10/24/2020, 6:01 PM

## 2020-10-24 NOTE — Progress Notes (Signed)
PROGRESS NOTE    [name]  JXB:[id number] DOB: 1940-05-29 DOA: 10/22/2020 PCP: Arnette Felts, FNP   No chief complaint on file.   Brief Narrative: 80 year old female with diastolic CHF, fibromyalgia, GERD abnormalities for her fall of the bed while sleeping the day prior to admission around 5:30 AM, might have had hit her head, sustained a small bruise in her tailbone and over the weekend had intermittent slurred speech and found confused by the family and brought to the ED.  In the ED febrile 104.5 UA consistent with UTI blood culture positive for E. coli.  Patient was admitted for E. coli sepsis secondary to UTI, acute metabolic encephalopathy.  Subjective: Seen this am afebrile overnight.  hemodynamically stable She is alert awake oriented no speech issues no swallowing issues. See report she fell at home and she was "out" Assessment & Plan:  E. Coli sepsis  2/2 A secondary to  E coli UTI: Sepsis POA.  Sepsis parameters resolved.  Afebrile currently and hemodynamically stable.  C/S pending on urine and blood culture, continue on ceftriaxone 2 g for now.  Check renal ultrasound. Recent Labs  Lab 10/22/20 1233 10/22/20 1245 10/22/20 1440 10/23/20 0204 10/24/20 0021  WBC  --  5.7  --  4.1 6.4  LATICACIDVEN 1.5  --  1.4  --   --    Acute metabolic encephalopathy on admission with dysarthria/slurred speech: CT head no acute finding suspect in the setting of #1.  If persist may need MRI.  However her mental status appears improved, nonfocal on exam.  Fall at home PT OT to continue  Hypokalemia: Add KCl 20 M EQ twice daily x2 days  Metabolic acidosis: Resolved  Fibromyalgia Depression Continue on Zoloft, Topamax and Lyrica.  Anemia mild advise outpatient follow-up  Thrombocytopenia likely from sepsis.  Monitor Recent Labs  Lab 10/22/20 1245 10/23/20 0204 10/24/20 0021  PLT 127* 101* 112*    Diet Order             Diet Heart Room service appropriate? Yes with  Assist; Fluid consistency: Thin  Diet effective now                 Patient's Body mass index is 25.73 kg/m.  DVT prophylaxis: enoxaparin (LOVENOX) injection 40 mg Start: 10/22/20 1700 Code Status:   Code Status: Full Code  Family Communication: plan of care discussed with patient at bedside. Status is: Inpatient Remains inpatient appropriate because:IV treatments appropriate due to intensity of illness or inability to take PO and Inpatient level of care appropriate due to severity of illness Dispo: The patient is from: Home              Anticipated d/c is to: Needs a skilled nursing facility              Patient currently is not medically stable to d/c.   Difficult to place patient No  Unresulted Labs (From admission, onward)     Start     Ordered   10/22/20 1545  TSH  Once,   STAT        10/22/20 1544   10/22/20 1239  Miscellaneous Genetic Test (non-interfaced send-out)  Once,   STAT       Comments: SeptiCyte in PAXgene tube   10/22/20 1238            Medications reviewed:  Scheduled Meds:  acetaminophen  650 mg Rectal Once   clopidogrel  75 mg Oral Daily   enoxaparin (  LOVENOX) injection  40 mg Subcutaneous QHS   loratadine  10 mg Oral Daily   pregabalin  25 mg Oral TID   sertraline  50 mg Oral Daily   sodium chloride flush  3 mL Intravenous Q12H   topiramate  25-75 mg Oral BID   Continuous Infusions:  cefTRIAXone (ROCEPHIN)  IV 2 g (10/23/20 1730)   lactated ringers 100 mL/hr at 10/24/20 0443   Consultants:see note  Procedures:see note Antimicrobials: Anti-infectives (From admission, onward)    Start     Dose/Rate Route Frequency Ordered Stop   10/23/20 1600  vancomycin (VANCOREADY) IVPB 750 mg/150 mL  Status:  Discontinued        750 mg 150 mL/hr over 60 Minutes Intravenous Every 24 hours 10/22/20 1453 10/22/20 1549   10/23/20 1600  cefTRIAXone (ROCEPHIN) 2 g in sodium chloride 0.9 % 100 mL IVPB        2 g 200 mL/hr over 30 Minutes Intravenous Every 24  hours 10/23/20 0711     10/23/20 0130  ceFEPIme (MAXIPIME) 2 g in sodium chloride 0.9 % 100 mL IVPB  Status:  Discontinued        2 g 200 mL/hr over 30 Minutes Intravenous Every 12 hours 10/22/20 1453 10/22/20 1549   10/22/20 2200  cefTRIAXone (ROCEPHIN) 1 g in sodium chloride 0.9 % 100 mL IVPB  Status:  Discontinued        1 g 200 mL/hr over 30 Minutes Intravenous Every 24 hours 10/22/20 1549 10/23/20 0711   10/22/20 1315  ceFEPIme (MAXIPIME) 2 g in sodium chloride 0.9 % 100 mL IVPB        2 g 200 mL/hr over 30 Minutes Intravenous  Once 10/22/20 1313 10/22/20 1359   10/22/20 1315  metroNIDAZOLE (FLAGYL) IVPB 500 mg        500 mg 100 mL/hr over 60 Minutes Intravenous  Once 10/22/20 1313 10/22/20 1512   10/22/20 1315  vancomycin (VANCOREADY) IVPB 1250 mg/250 mL        1,250 mg 166.7 mL/hr over 90 Minutes Intravenous  Once 10/22/20 1313 10/22/20 1657      Culture/Microbiology    Component Value Date/Time   SDES BLOOD RIGHT HAND 10/22/2020 1239   SPECREQUEST  10/22/2020 1239    BOTTLES DRAWN AEROBIC AND ANAEROBIC Blood Culture results may not be optimal due to an inadequate volume of blood received in culture bottles   CULT (A) 10/22/2020 1239    ESCHERICHIA COLI SUSCEPTIBILITIES TO FOLLOW Performed at [facility], [address] [place], [place] [address]    REPTSTATUS PENDING 10/22/2020 1239    Other culture-see note  Objective: Vitals: Today's Vitals   10/23/20 2000 10/23/20 2138 10/24/20 0059 10/24/20 0450  BP:  118/64 118/68 (!) 116/48  Pulse:  73 66 60  Resp:  Temp:  98.5 F (36.9 C) 98.7 F (37.1 C) 98.3 F (36.8 C)  TempSrc:  Oral Oral Oral  SpO2:  94% 95% 95%  Weight:      Height:      PainSc: 0-No pain       Intake/Output Summary (Last 24 hours) at 10/24/2020 0820 Last data filed at 10/24/2020 0443 Gross per 24 hour  Intake 2474.34 ml  Output --  Net 2474.34 ml   Filed Weights   10/22/20 1300  Weight: 68 kg   Weight change:    Intake/Output from previous day: 08/16 0701 - 08/17 0700 In: 2474.3 [I.V.:1874.3; IV Piggyback:600] Out: -  Intake/Output this shift: No intake/output data recorded. Filed Weights   10/22/20 1300  Weight: 68 kg   Examination: General exam: AAO x3, pleasant older than stated age, weak appearing. HEENT:Oral mucosa moist, Ear/Nose WNL grossly,dentition normal. Respiratory system: bilaterally diminished,no use of accessory muscle, non tender. Cardiovascular system: S1 & S2 +,No JVD. Gastrointestinal system: Abdomen soft, NT,ND, BS+. Nervous System:Alert, awake, moving extremities Extremities: no edema, distal peripheral pulses palpable.  Skin: No rashes,no icterus. MSK: Normal muscle bulk,tone, power Data Reviewed: I have personally reviewed following labs and imaging studies CBC: Recent Labs  Lab 10/22/20 1245 10/22/20 1255 10/23/20 0204 10/24/20 0021  WBC 5.7  --  4.1 6.4  NEUTROABS 5.5  --   --   --   HGB 12.8 11.9* 11.4* 10.4*  HCT 39.0 35.0* 34.5* 31.1*  MCV 94.0  --  93.0 91.2  PLT 127*  --  101* 112*   Basic Metabolic Panel: Recent Labs  Lab 10/22/20 1245 10/22/20 1255 10/23/20 0204 10/24/20 0021  NA 135 137 136 137  K 3.1* 3.1* 3.1* 3.4*  CL 106  --  108 109  CO2 18*  --  19* 23  GLUCOSE 138*  --  112* 104*  BUN 22  --  19 16  CREATININE 1.09*  --  0.96 0.80  CALCIUM 8.3*  --  7.9* 8.2*  MG  --   --   --  1.7   GFR: Estimated Creatinine Clearance: 54.9 mL/min (by C-G formula based on SCr of 0.8 mg/dL). Liver Function Tests: Recent Labs  Lab 10/22/20 1245  AST 61*  ALT 21  ALKPHOS 66  BILITOT 1.0  PROT 5.8*  ALBUMIN 3.0*   Recent Labs  Lab 10/22/20 1245  LIPASE 21   No results for input(s): AMMONIA in the last 168 hours. Coagulation Profile: Recent Labs  Lab 10/22/20 1245  INR 1.3*   Cardiac Enzymes: No results for input(s): CKTOTAL, CKMB, CKMBINDEX, TROPONINI in the last 168 hours. BNP (last 3 results) No results for input(s):  PROBNP in the last 8760 hours. HbA1C: No results for input(s): HGBA1C in the last 72 hours. CBG: No results for input(s): GLUCAP in the last 168 hours. Lipid Profile: No results for input(s): CHOL, HDL, LDLCALC, TRIG, CHOLHDL, LDLDIRECT in the last 72 hours. Thyroid Function Tests: No results for input(s): TSH, T4TOTAL, FREET4, T3FREE, THYROIDAB in the last 72 hours. Anemia Panel: Recent Labs    10/24/20 0021  VITAMINB12 241   Sepsis Labs: Recent Labs  Lab 10/22/20 1233 10/22/20 1440  LATICACIDVEN 1.5 1.4    Recent Results (from the past 240 hour(s))  Urine Culture     Status: Abnormal (Preliminary result)   Collection Time: 10/22/20 12:33 PM   Specimen: In/Out Cath Urine  Result Value Ref Range Status   Specimen Description IN/OUT CATH URINE  Final   Special Requests NONE  Final   Culture (A)  Final    >=100,000 COLONIES/mL ESCHERICHIA COLI SUSCEPTIBILITIES TO FOLLOW Performed at [facility], [address] [place], [place] [address]    Report Status PENDING  Incomplete  Blood Culture (routine x 2)     Status: None (Preliminary result)   Collection Time: 10/22/20 12:38 PM   Specimen: BLOOD RIGHT WRIST  Result Value Ref Range Status   Specimen Description BLOOD RIGHT WRIST  Final   Special Requests   Final    BOTTLES DRAWN AEROBIC AND ANAEROBIC Blood Culture results may not be optimal due to an inadequate volume  of blood received in culture bottles   Culture   Final    NO GROWTH < 24 HOURS Performed at [facility], [address] [place], [place] [address]    Report Status PENDING  Incomplete  Blood Culture (routine x 2)     Status: Abnormal (Preliminary result)   Collection Time: 10/22/20 12:39 PM   Specimen: BLOOD RIGHT HAND  Result Value Ref Range Status   Specimen Description BLOOD RIGHT HAND  Final   Special Requests   Final    BOTTLES DRAWN AEROBIC AND ANAEROBIC Blood Culture results may not be optimal due to an inadequate volume of blood  received in culture bottles   Culture  Setup Time   Final    GRAM NEGATIVE RODS ANAEROBIC BOTTLE ONLY CRITICAL RESULT CALLED TO, READ BACK BY AND VERIFIED WITH: PHARMD JAMES LEDFORD 10/23/20@07 :10 BY TW    Culture (A)  Final    ESCHERICHIA COLI SUSCEPTIBILITIES TO FOLLOW Performed at [facility], [address] [place], [place] [address]    Report Status PENDING  Incomplete  Blood Culture ID Panel (Reflexed)     Status: Abnormal   Collection Time: 10/22/20 12:39 PM  Result Value Ref Range Status   Enterococcus faecalis NOT DETECTED NOT DETECTED Final   Enterococcus Faecium NOT DETECTED NOT DETECTED Final   Listeria monocytogenes NOT DETECTED NOT DETECTED Final   Staphylococcus species NOT DETECTED NOT DETECTED Final   Staphylococcus aureus (BCID) NOT DETECTED NOT DETECTED Final   Staphylococcus epidermidis NOT DETECTED NOT DETECTED Final   Staphylococcus lugdunensis NOT DETECTED NOT DETECTED Final   Streptococcus species NOT DETECTED NOT DETECTED Final   Streptococcus agalactiae NOT DETECTED NOT DETECTED Final   Streptococcus pneumoniae NOT DETECTED NOT DETECTED Final   Streptococcus pyogenes NOT DETECTED NOT DETECTED Final   A.calcoaceticus-baumannii NOT DETECTED NOT DETECTED Final   Bacteroides fragilis NOT DETECTED NOT DETECTED Final   Enterobacterales DETECTED (A) NOT DETECTED Final    Comment: Enterobacterales represent a large order of gram negative bacteria, not a single organism. CRITICAL RESULT CALLED TO, READ BACK BY AND VERIFIED WITH: PHARMD JAMES LEDFORD 10/23/20@07 :10 BY TW    Enterobacter cloacae complex NOT DETECTED NOT DETECTED Final   Escherichia coli DETECTED (A) NOT DETECTED Final    Comment: CRITICAL RESULT CALLED TO, READ BACK BY AND VERIFIED WITH: PHARMD JAMES LEDFORD 10/23/20@07 :10 BY TW    Klebsiella aerogenes NOT DETECTED NOT DETECTED Final   Klebsiella oxytoca NOT DETECTED NOT DETECTED Final   Klebsiella pneumoniae NOT DETECTED NOT DETECTED  Final   Proteus species NOT DETECTED NOT DETECTED Final   Salmonella species NOT DETECTED NOT DETECTED Final   Serratia marcescens NOT DETECTED NOT DETECTED Final   Haemophilus influenzae NOT DETECTED NOT DETECTED Final   Neisseria meningitidis NOT DETECTED NOT DETECTED Final   Pseudomonas aeruginosa NOT DETECTED NOT DETECTED Final   Stenotrophomonas maltophilia NOT DETECTED NOT DETECTED Final   Candida albicans NOT DETECTED NOT DETECTED Final   Candida auris NOT DETECTED NOT DETECTED Final   Candida glabrata NOT DETECTED NOT DETECTED Final   Candida krusei NOT DETECTED NOT DETECTED Final   Candida parapsilosis NOT DETECTED NOT DETECTED Final   Candida tropicalis NOT DETECTED NOT DETECTED Final   Cryptococcus neoformans/gattii NOT DETECTED NOT DETECTED Final   CTX-M ESBL NOT DETECTED NOT DETECTED Final   Carbapenem resistance IMP NOT DETECTED NOT DETECTED Final   Carbapenem resistance KPC NOT DETECTED NOT DETECTED Final   Carbapenem resistance NDM NOT  DETECTED NOT DETECTED Final   Carbapenem resist OXA 48 LIKE NOT DETECTED NOT DETECTED Final   Carbapenem resistance VIM NOT DETECTED NOT DETECTED Final    Comment: Performed at [facility], [address] [place], [place] [address]  Resp Panel by RT-PCR (Flu A&B, Covid) Nasopharyngeal Swab     Status: None   Collection Time: 10/22/20  1:05 PM   Specimen: Nasopharyngeal Swab; Nasopharyngeal(NP) swabs in vial transport medium  Result Value Ref Range Status   SARS Coronavirus 2 by RT PCR NEGATIVE NEGATIVE Final    Comment: (NOTE) SARS-CoV-2 target nucleic acids are NOT DETECTED.  The SARS-CoV-2 RNA is generally detectable in upper respiratory specimens during the acute phase of infection. The lowest concentration of SARS-CoV-2 viral copies this assay can detect is 138 copies/mL. A negative result does not preclude SARS-Cov-2 infection and should not be used as the sole basis for treatment or other patient management decisions. A  negative result may occur with  improper specimen collection/handling, submission of specimen other than nasopharyngeal swab, presence of viral mutation(s) within the areas targeted by this assay, and inadequate number of viral copies(<138 copies/mL). A negative result must be combined with clinical observations, patient history, and epidemiological information. The expected result is Negative.  Fact Sheet for Patients:  BloggerCourse.comhttps://www.fda.gov/media/152166/download  Fact Sheet for Healthcare Providers:  SeriousBroker.ithttps://www.fda.gov/media/152162/download  This test is no t yet approved or cleared by the [place] FDA and  has been authorized for detection and/or diagnosis of SARS-CoV-2 by FDA under an Emergency Use Authorization (EUA). This EUA will remain  in effect (meaning this test can be used) for the duration of the COVID-19 declaration under Section 564(b)(1) of the Act, 21 U.S.C.section 360bbb-3(b)(1), unless the authorization is terminated  or revoked sooner.       Influenza A by PCR NEGATIVE NEGATIVE Final   Influenza B by PCR NEGATIVE NEGATIVE Final    Comment: (NOTE) The Xpert Xpress SARS-CoV-2/FLU/RSV plus assay is intended as an aid in the diagnosis of influenza from Nasopharyngeal swab specimens and should not be used as a sole basis for treatment. Nasal washings and aspirates are unacceptable for Xpert Xpress SARS-CoV-2/FLU/RSV testing.  Fact Sheet for Patients: BloggerCourse.comhttps://www.fda.gov/media/152166/download  Fact Sheet for Healthcare Providers: SeriousBroker.ithttps://www.fda.gov/media/152162/download  This test is not yet approved or cleared by the [place] FDA and has been authorized for detection and/or diagnosis of SARS-CoV-2 by FDA under an Emergency Use Authorization (EUA). This EUA will remain in effect (meaning this test can be used) for the duration of the COVID-19 declaration under Section 564(b)(1) of the Act, 21 U.S.C. section 360bbb-3(b)(1), unless the authorization  is terminated or revoked.  Performed at [facility], [address] [place], [place] [address]      Radiology Studies: CT HEAD WO CONTRAST (5MM)  Result Date: 10/22/2020 CLINICAL DATA:  Altered mental status with fever EXAM: CT HEAD WITHOUT CONTRAST TECHNIQUE: Contiguous axial images were obtained from the base of the skull through the vertex without intravenous contrast. COMPARISON:  CT brain 05/17/2020 FINDINGS: Brain: No acute territorial infarction, hemorrhage or intracranial mass. Patchy white matter hypodensity with chronic small vessel ischemic change. Nonenlarged ventricles. Vascular: No hyperdense vessel.  No unexpected calcification Skull: Normal. Negative for fracture or focal lesion. Sinuses/Orbits: No acute finding. Other: None IMPRESSION: 1. No CT evidence for acute intracranial abnormality. 2. Mild chronic small vessel ischemic change of the white matter Electronically Signed   By: Jasmine PangKim  Fujinaga M.D.   On: 10/22/2020 18:34  DG Chest Port 1 View  Result Date: 10/22/2020 CLINICAL DATA:  Questionable sepsis EXAM: PORTABLE CHEST 1 VIEW COMPARISON:  Chest radiograph 01/12/2020 FINDINGS: The heart is at the upper limits of normal size, and the mediastinum appears prominent, both likely exaggerated by AP technique and rightward patient rotation. Lung volumes are low. There is no congestion without over pulmonary edema. There is no focal consolidation. There is no pleural effusion. There is no pneumothorax. There is no acute osseous abnormality. There is degenerative change of both shoulders. IMPRESSION: Borderline cardiomegaly with vascular congestion but no overt pulmonary edema. Electronically Signed   By: Lesia Hausen M.D.   On: 10/22/2020 13:04     LOS: 2 days   Lanae Boast, MD [facility]  10/24/2020, 8:20 AM

## 2020-10-25 ENCOUNTER — Ambulatory Visit: Payer: Self-pay

## 2020-10-25 ENCOUNTER — Telehealth: Payer: Self-pay

## 2020-10-25 DIAGNOSIS — F3341 Major depressive disorder, recurrent, in partial remission: Secondary | ICD-10-CM

## 2020-10-25 DIAGNOSIS — A419 Sepsis, unspecified organism: Secondary | ICD-10-CM

## 2020-10-25 DIAGNOSIS — E559 Vitamin D deficiency, unspecified: Secondary | ICD-10-CM

## 2020-10-25 DIAGNOSIS — I2510 Atherosclerotic heart disease of native coronary artery without angina pectoris: Secondary | ICD-10-CM

## 2020-10-25 LAB — POTASSIUM: Potassium: 4.3 mmol/L (ref 3.5–5.1)

## 2020-10-25 LAB — CBC
HCT: 31.8 % — ABNORMAL LOW (ref 36.0–46.0)
Hemoglobin: 10.9 g/dL — ABNORMAL LOW (ref 12.0–15.0)
MCH: 30.6 pg (ref 26.0–34.0)
MCHC: 34.3 g/dL (ref 30.0–36.0)
MCV: 89.3 fL (ref 80.0–100.0)
Platelets: 126 10*3/uL — ABNORMAL LOW (ref 150–400)
RBC: 3.56 MIL/uL — ABNORMAL LOW (ref 3.87–5.11)
RDW: 13.1 % (ref 11.5–15.5)
WBC: 6.3 10*3/uL (ref 4.0–10.5)
nRBC: 0 % (ref 0.0–0.2)

## 2020-10-25 LAB — CULTURE, BLOOD (ROUTINE X 2)

## 2020-10-25 MED ORDER — AMOXICILLIN 500 MG PO CAPS: 500.0000 mg | ORAL_CAPSULE | Freq: Three times a day (TID) | ORAL | 0 refills | Status: AC

## 2020-10-25 NOTE — Discharge Summary (Signed)
Physician Discharge Summary  [name] VEL:[id number] DOB: 12/07/1940 DOA: 10/22/2020  PCP: [name], FNP  Admit date: 10/22/2020 Discharge date: 10/25/2020  Admitted From: home Disposition:  HHPT  Recommendations for Outpatient Follow-up:  Follow up with PCP in 1-2 weeks Please obtain BMP/CBC in one week   Home Health:YES  Equipment/Devices: NO  Discharge Condition: Stable Code Status:   Code Status: Full Code Diet recommendation:  Diet Order             Diet Heart Room service appropriate? Yes with Assist; Fluid consistency: Thin  Diet effective now                    Brief/Interim Summary: 80 year old female with diastolic CHF, fibromyalgia, GERD abnormalities for her fall of the bed while sleeping the day prior to admission around 5:30 AM, might have had hit her head, sustained a small bruise in her tailbone and over the weekend had intermittent slurred speech and found confused by the family and brought to the ED.  In the ED febrile 104.5 UA consistent with UTI blood culture positive for E. coli.  Patient was admitted for E. coli sepsis secondary to UTI, acute metabolic encephalopathy. Patient was treated with IV fluids IV antibiotics.  Mental status is improved and appears at baseline. Seen by physical therapy occupational therapy and is being set up with home health at this time medically stable.  She will be discharged home once blood culture are back, urine culture with pansensitive E. Coli.  Discharge Diagnoses:   E. Coli severe sepsis  2/2 A secondary to  E coli UTI: Severe Sepsis  w/ POA.  Met severe sepsis criteria with tachycardia fever up to 105, tachypnea, E. coli bacteremia and acute encephalopathy.  Now resolved.  Renal ultrasound unremarkable.  Pansensitive E. coli in urine, pending blood culture before discharge.  Seen by ID.  Blood culture came back pansensitive, discussed with Dr. Graylon Good advises to switch her to amoxicillin 500 mg tud to complete  total 7 days course, 4 days left.  Acute metabolic encephalopathy on admission with dysarthria/slurred speech: CT head no acute finding secondary to #1.  Resolved.     Fall at home PT OT to continue   Hypokalemia: Replaced.     Metabolic acidosis: Resolved   Fibromyalgia Depression Continue on Zoloft, Topamax and Lyrica.   Anemia mild advise outpatient follow-up   Thrombocytopenia from sepsis.  Platelet counts improving.  Follow-up with PCP  Consults: ID  Subjective: Alert and oriented, back to baseline.  Would like to go home today.  Reports daughter lives with her. Discharge Exam: Vitals:   10/25/20 0341 10/25/20 0800  BP: 129/61 121/62  Pulse: 70 63  Resp: 20 18  Temp: 98.1 F (36.7 C) 98.3 F (36.8 C)  SpO2: 97% 95%   General: Pt is alert, awake, not in acute distress Cardiovascular: RRR, S1/S2 +, no rubs, no gallops Respiratory: CTA bilaterally, no wheezing, no rhonchi Abdominal: Soft, NT, ND, bowel sounds + Extremities: no edema, no cyanosis  Discharge Instructions  Discharge Instructions     Discharge instructions   Complete by: As directed    Please call call MD or return to ER for similar or worsening recurring problem that brought you to hospital or if any fever,nausea/vomiting,abdominal pain, uncontrolled pain, chest pain,  shortness of breath or any other alarming symptoms.  Please follow-up your doctor as instructed in a week time and call the office for appointment.  Please avoid alcohol,  smoking, or any other illicit substance and maintain healthy habits including taking your regular medications as prescribed.  You were cared for by a hospitalist during your hospital stay. If you have any questions about your discharge medications or the care you received while you were in the hospital after you are discharged, you can call the unit and ask to speak with the hospitalist on call if the hospitalist that took care of you is not available.  Once you  are discharged, your primary care physician will handle any further medical issues. Please note that NO REFILLS for any discharge medications will be authorized once you are discharged, as it is imperative that you return to your primary care physician (or establish a relationship with a primary care physician if you do not have one) for your aftercare needs so that they can reassess your need for medications and monitor your lab values   Increase activity slowly   Complete by: As directed       Allergies as of 10/25/2020       Reactions   Aspirin Anaphylaxis, Hives   Elavil [amitriptyline] Anaphylaxis   Latex    Relafen [nabumetone] Hives        Medication List     TAKE these medications    acetaminophen 500 MG tablet Commonly known as: TYLENOL Take 1,000 mg by mouth every 6 (six) hours as needed.   amoxicillin 500 MG capsule Commonly known as: AMOXIL Take 1 capsule (500 mg total) by mouth 3 (three) times daily for 4 days.   cetirizine 10 MG tablet Commonly known as: ZYRTEC Take 10 mg by mouth daily.   clopidogrel 75 MG tablet Commonly known as: PLAVIX TAKE 1 TABLET BY MOUTH EVERY DAY   fluticasone 50 MCG/ACT nasal spray Commonly known as: FLONASE Place into both nostrils daily as needed for allergies or rhinitis.   pregabalin 25 MG capsule Commonly known as: LYRICA Take 1 capsule 3 times daily   sertraline 50 MG tablet Commonly known as: ZOLOFT TAKE 1 TABLET BY MOUTH EVERY DAY   SYSTANE OP Apply to eye. As needed   topiramate 25 MG tablet Commonly known as: TOPAMAX Take one tablet in the morning and 3 in the evening   vitamin C 250 MG tablet Commonly known as: ASCORBIC ACID Take 250 mg by mouth daily.   Vitamin D 50 MCG (2000 UT) tablet Take 2,000 Units by mouth daily.               Durable Medical Equipment  (From admission, onward)           Start     Ordered   10/25/20 1046  For home use only DME 3 n 1  Once        10/25/20 1045    10/25/20 1044  For home use only DME standard manual wheelchair with seat cushion  Once       Comments: Patient suffers from E. Coli sepsis  2/2 A secondary to  E coli UTI which impairs their ability to perform daily activities like ambulating  in the home.  A cane  will not resolve issue with performing activities of daily living. A wheelchair will allow patient to safely perform daily activities. Patient can safely propel the wheelchair in the home or has a caregiver who can provide assistance. Length of need lifetime . Accessories: elevating leg rests (ELRs), wheel locks, extensions and anti-tippers.  Seat and back cushion   18 inch   10/25/20  [place], [facility] Follow up.   Specialty: Home Health Services Contact information: [place] [phone]         [name], FNP Follow up in 1 week(s).   Specialty: General Practice Why: For CBC and BMP Contact information: [place] [address] [phone]                Allergies  Allergen Reactions   Aspirin Anaphylaxis and Hives   Elavil [Amitriptyline] Anaphylaxis   Latex    Relafen [Nabumetone] Hives    The results of significant diagnostics from this hospitalization (including imaging, microbiology, ancillary and laboratory) are listed below for reference.    Microbiology: Recent Results (from the past 240 hour(s))  Urine Culture     Status: Abnormal   Collection Time: 10/22/20 12:33 PM   Specimen: In/Out Cath Urine  Result Value Ref Range Status   Specimen Description IN/OUT CATH URINE  Final   Special Requests   Final    NONE Performed at [facility], [address] [place], [place] [address]    Culture >=100,000 COLONIES/mL ESCHERICHIA COLI (A)  Final   Report Status 10/24/2020 FINAL  Final   Organism ID, Bacteria ESCHERICHIA COLI (A)  Final      Susceptibility    Escherichia coli - MIC*    AMPICILLIN <=2 SENSITIVE Sensitive     CEFAZOLIN <=4 SENSITIVE Sensitive     CEFEPIME <=0.12 SENSITIVE Sensitive     CEFTRIAXONE <=0.25 SENSITIVE Sensitive     CIPROFLOXACIN <=0.25 SENSITIVE Sensitive     GENTAMICIN <=1 SENSITIVE Sensitive     IMIPENEM <=0.25 SENSITIVE Sensitive     NITROFURANTOIN <=16 SENSITIVE Sensitive     TRIMETH/SULFA <=20 SENSITIVE Sensitive     AMPICILLIN/SULBACTAM <=2 SENSITIVE Sensitive     PIP/TAZO <=4 SENSITIVE Sensitive     * >=100,000 COLONIES/mL ESCHERICHIA COLI  Blood Culture (routine x 2)     Status: None (Preliminary result)   Collection Time: 10/22/20 12:38 PM   Specimen: BLOOD RIGHT WRIST  Result Value Ref Range Status   Specimen Description BLOOD RIGHT WRIST  Final   Special Requests   Final    BOTTLES DRAWN AEROBIC AND ANAEROBIC Blood Culture results may not be optimal due to an inadequate volume of blood received in culture bottles   Culture   Final    NO GROWTH 3 DAYS Performed at [facility], [address], [place], [place] [address]    Report Status PENDING  Incomplete  Blood Culture (routine x 2)     Status: Abnormal   Collection Time: 10/22/20 12:39 PM   Specimen: BLOOD RIGHT HAND  Result Value Ref Range Status   Specimen Description BLOOD RIGHT HAND  Final   Special Requests   Final    BOTTLES DRAWN AEROBIC AND ANAEROBIC Blood Culture results may not be optimal due to an inadequate volume of blood received in culture bottles   Culture  Setup Time   Final    GRAM NEGATIVE RODS ANAEROBIC BOTTLE ONLY CRITICAL RESULT CALLED TO, READ BACK BY AND VERIFIED WITH: PHARMD JAMES LEDFORD 10/23/20'@07' :10 BY TW Performed at [facility], [place] [address] [place], [place] [address]    Culture ESCHERICHIA COLI (A)  Final   Report Status 10/25/2020 FINAL  Final   Organism ID, Bacteria  ESCHERICHIA COLI  Final      Susceptibility   Escherichia coli - MIC*    AMPICILLIN <=2 SENSITIVE Sensitive     CEFAZOLIN  <=4 SENSITIVE Sensitive     CEFEPIME <=0.12 SENSITIVE Sensitive     CEFTAZIDIME <=1 SENSITIVE Sensitive     CEFTRIAXONE <=0.25 SENSITIVE Sensitive     CIPROFLOXACIN <=0.25 SENSITIVE Sensitive     GENTAMICIN <=1 SENSITIVE Sensitive     IMIPENEM <=0.25 SENSITIVE Sensitive     TRIMETH/SULFA <=20 SENSITIVE Sensitive     AMPICILLIN/SULBACTAM <=2 SENSITIVE Sensitive     PIP/TAZO <=4 SENSITIVE Sensitive     * ESCHERICHIA COLI  Blood Culture ID Panel (Reflexed)     Status: Abnormal   Collection Time: 10/22/20 12:39 PM  Result Value Ref Range Status   Enterococcus faecalis NOT DETECTED NOT DETECTED Final   Enterococcus Faecium NOT DETECTED NOT DETECTED Final   Listeria monocytogenes NOT DETECTED NOT DETECTED Final   Staphylococcus species NOT DETECTED NOT DETECTED Final   Staphylococcus aureus (BCID) NOT DETECTED NOT DETECTED Final   Staphylococcus epidermidis NOT DETECTED NOT DETECTED Final   Staphylococcus lugdunensis NOT DETECTED NOT DETECTED Final   Streptococcus species NOT DETECTED NOT DETECTED Final   Streptococcus agalactiae NOT DETECTED NOT DETECTED Final   Streptococcus pneumoniae NOT DETECTED NOT DETECTED Final   Streptococcus pyogenes NOT DETECTED NOT DETECTED Final   A.calcoaceticus-baumannii NOT DETECTED NOT DETECTED Final   Bacteroides fragilis NOT DETECTED NOT DETECTED Final   Enterobacterales DETECTED (A) NOT DETECTED Final    Comment: Enterobacterales represent a large order of gram negative bacteria, not a single organism. CRITICAL RESULT CALLED TO, READ BACK BY AND VERIFIED WITH: PHARMD JAMES LEDFORD 10/23/20'@07' :10 BY TW    Enterobacter cloacae complex NOT DETECTED NOT DETECTED Final   Escherichia coli DETECTED (A) NOT DETECTED Final    Comment: CRITICAL RESULT CALLED TO, READ BACK BY AND VERIFIED WITH: PHARMD JAMES LEDFORD 10/23/20'@07' :10 BY TW    Klebsiella aerogenes NOT DETECTED NOT DETECTED Final   Klebsiella oxytoca NOT DETECTED NOT DETECTED Final   Klebsiella  pneumoniae NOT DETECTED NOT DETECTED Final   Proteus species NOT DETECTED NOT DETECTED Final   Salmonella species NOT DETECTED NOT DETECTED Final   Serratia marcescens NOT DETECTED NOT DETECTED Final   Haemophilus influenzae NOT DETECTED NOT DETECTED Final   Neisseria meningitidis NOT DETECTED NOT DETECTED Final   Pseudomonas aeruginosa NOT DETECTED NOT DETECTED Final   Stenotrophomonas maltophilia NOT DETECTED NOT DETECTED Final   Candida albicans NOT DETECTED NOT DETECTED Final   Candida auris NOT DETECTED NOT DETECTED Final   Candida glabrata NOT DETECTED NOT DETECTED Final   Candida krusei NOT DETECTED NOT DETECTED Final   Candida parapsilosis NOT DETECTED NOT DETECTED Final   Candida tropicalis NOT DETECTED NOT DETECTED Final   Cryptococcus neoformans/gattii NOT DETECTED NOT DETECTED Final   CTX-M ESBL NOT DETECTED NOT DETECTED Final   Carbapenem resistance IMP NOT DETECTED NOT DETECTED Final   Carbapenem resistance KPC NOT DETECTED NOT DETECTED Final   Carbapenem resistance NDM NOT DETECTED NOT DETECTED Final   Carbapenem resist OXA 48 LIKE NOT DETECTED NOT DETECTED Final   Carbapenem resistance VIM NOT DETECTED NOT DETECTED Final    Comment: Performed at [facility], [place] [address] [place], [place] [address]  Resp Panel by RT-PCR (Flu A&B, Covid) Nasopharyngeal Swab     Status: None   Collection Time: 10/22/20  1:05 PM   Specimen: Nasopharyngeal Swab; Nasopharyngeal(NP) swabs in vial  transport medium  Result Value Ref Range Status   SARS Coronavirus 2 by RT PCR NEGATIVE NEGATIVE Final    Comment: (NOTE) SARS-CoV-2 target nucleic acids are NOT DETECTED.  The SARS-CoV-2 RNA is generally detectable in upper respiratory specimens during the acute phase of infection. The lowest concentration of SARS-CoV-2 viral copies this assay can detect is 138 copies/mL. A negative result does not preclude SARS-Cov-2 infection and should not be used as the sole basis for treatment  or other patient management decisions. A negative result may occur with  improper specimen collection/handling, submission of specimen other than nasopharyngeal swab, presence of viral mutation(s) within the areas targeted by this assay, and inadequate number of viral copies(<138 copies/mL). A negative result must be combined with clinical observations, patient history, and epidemiological information. The expected result is Negative.  Fact Sheet for Patients:  EntrepreneurPulse.com.au  Fact Sheet for Healthcare Providers:  IncredibleEmployment.be  This test is no t yet approved or cleared by the [place] FDA and  has been authorized for detection and/or diagnosis of SARS-CoV-2 by FDA under an Emergency Use Authorization (EUA). This EUA will remain  in effect (meaning this test can be used) for the duration of the COVID-19 declaration under Section 564(b)(1) of the Act, 21 U.S.C.section 360bbb-3(b)(1), unless the authorization is terminated  or revoked sooner.       Influenza A by PCR NEGATIVE NEGATIVE Final   Influenza B by PCR NEGATIVE NEGATIVE Final    Comment: (NOTE) The Xpert Xpress SARS-CoV-2/FLU/RSV plus assay is intended as an aid in the diagnosis of influenza from Nasopharyngeal swab specimens and should not be used as a sole basis for treatment. Nasal washings and aspirates are unacceptable for Xpert Xpress SARS-CoV-2/FLU/RSV testing.  Fact Sheet for Patients: EntrepreneurPulse.com.au  Fact Sheet for Healthcare Providers: IncredibleEmployment.be  This test is not yet approved or cleared by the [place] FDA and has been authorized for detection and/or diagnosis of SARS-CoV-2 by FDA under an Emergency Use Authorization (EUA). This EUA will remain in effect (meaning this test can be used) for the duration of the COVID-19 declaration under Section 564(b)(1) of the Act, 21 U.S.C. section  360bbb-3(b)(1), unless the authorization is terminated or revoked.  Performed at [facility], [place] [address], [place], [place] [address]     Procedures/Studies: CT HEAD WO CONTRAST (5MM)  Result Date: 10/22/2020 CLINICAL DATA:  Altered mental status with fever EXAM: CT HEAD WITHOUT CONTRAST TECHNIQUE: Contiguous axial images were obtained from the base of the skull through the vertex without intravenous contrast. COMPARISON:  CT brain 05/17/2020 FINDINGS: Brain: No acute territorial infarction, hemorrhage or intracranial mass. Patchy white matter hypodensity with chronic small vessel ischemic change. Nonenlarged ventricles. Vascular: No hyperdense vessel.  No unexpected calcification Skull: Normal. Negative for fracture or focal lesion. Sinuses/Orbits: No acute finding. Other: None IMPRESSION: 1. No CT evidence for acute intracranial abnormality. 2. Mild chronic small vessel ischemic change of the white matter Electronically Signed   By: Donavan Foil M.D.   On: 10/22/2020 18:34   US RENAL  Result Date: 10/24/2020 CLINICAL DATA:  UTI/sepsis. EXAM: RENAL / URINARY TRACT ULTRASOUND COMPLETE COMPARISON:  Abdominal ultrasound dated November 04, 2019. FINDINGS: Right Kidney: Renal measurements: 10.1 x 4.5 x 5.7 cm = volume: 155 mL. Echogenicity within normal limits. No mass or hydronephrosis visualized. Left Kidney: Renal measurements: 11.1 x 4.7 x 5.6 cm = volume: 154 mL. Echogenicity within normal limits. No mass or hydronephrosis visualized. Bladder: Appears normal for degree  of bladder distention. Other: None. IMPRESSION: 1. Normal renal ultrasound. Electronically Signed   By: Titus Dubin M.D.   On: 10/24/2020 12:03   DG Chest Port 1 View  Result Date: 10/22/2020 CLINICAL DATA:  Questionable sepsis EXAM: PORTABLE CHEST 1 VIEW COMPARISON:  Chest radiograph 01/12/2020 FINDINGS: The heart is at the upper limits of normal size, and the mediastinum appears prominent, both likely exaggerated by  AP technique and rightward patient rotation. Lung volumes are low. There is no congestion without over pulmonary edema. There is no focal consolidation. There is no pleural effusion. There is no pneumothorax. There is no acute osseous abnormality. There is degenerative change of both shoulders. IMPRESSION: Borderline cardiomegaly with vascular congestion but no overt pulmonary edema. Electronically Signed   By: Valetta Mole M.D.   On: 10/22/2020 13:04   Intravitreal Injection, Pharmacologic Agent - OD - Right Eye  Result Date: 10/01/2020 Time Out 10/01/2020. 2:13 PM. Confirmed correct patient, procedure, site, and patient consented. Anesthesia Topical anesthesia was used. Anesthetic medications included Akten 3.5%. Procedure Preparation included Ofloxacin , Tobramycin 0.3%, 10% betadine to eyelids, 5% betadine to ocular surface. A 30 gauge needle was used. Injection: 2.5 mg bevacizumab 2.5 MG/0.1ML   Route: Intravitreal, Site: Right Eye   NDC: [phone], Lot: [id number] Post-op Post injection exam found visual acuity of at least counting fingers. The patient tolerated the procedure well. There were no complications. The patient received written and verbal post procedure care education. Post injection medications were not given.   OCT, Retina - OU - Both Eyes  Result Date: 10/01/2020 Right Eye Quality was good. Scan locations included subfoveal. Central Foveal Thickness: 276. Progression has worsened. Findings include vitreous traction, no IRF, abnormal foveal contour, pigment epithelial detachment, subretinal fluid. Left Eye Quality was good. Scan locations included subfoveal. Central Foveal Thickness: 270. Progression has been stable. Findings include retinal drusen . Notes Small residual cyst serous subretinal detachment temporal to the fovea much improved though compared to August, OD vastly improved overall.  Apparent posterior vitreous detachment now OS with incidental posterior vitreous detachment    Labs: BNP (last 3 results) No results for input(s): BNP in the last 8760 hours. Basic Metabolic Panel: Recent Labs  Lab 10/22/20 1245 10/22/20 1255 10/23/20 0204 10/24/20 0021 10/25/20 0324  NA 135 137 136 137  --   K 3.1* 3.1* 3.1* 3.4* 4.3  CL 106  --  108 109  --   CO2 18*  --  19* 23  --   GLUCOSE 138*  --  112* 104*  --   BUN 22  --  19 16  --   CREATININE 1.09*  --  0.96 0.80  --   CALCIUM 8.3*  --  7.9* 8.2*  --   MG  --   --   --  1.7  --    Liver Function Tests: Recent Labs  Lab 10/22/20 1245  AST 61*  ALT 21  ALKPHOS 66  BILITOT 1.0  PROT 5.8*  ALBUMIN 3.0*   Recent Labs  Lab 10/22/20 1245  LIPASE 21   No results for input(s): AMMONIA in the last 168 hours. CBC: Recent Labs  Lab 10/22/20 1245 10/22/20 1255 10/23/20 0204 10/24/20 0021 10/25/20 0324  WBC 5.7  --  4.1 6.4 6.3  NEUTROABS 5.5  --   --   --   --   HGB 12.8 11.9* 11.4* 10.4* 10.9*  HCT 39.0 35.0* 34.5* 31.1* 31.8*  MCV 94.0  --  93.0  91.2 89.3  PLT 127*  --  101* 112* 126*   Cardiac Enzymes: No results for input(s): CKTOTAL, CKMB, CKMBINDEX, TROPONINI in the last 168 hours. BNP: Invalid input(s): POCBNP CBG: No results for input(s): GLUCAP in the last 168 hours. D-Dimer No results for input(s): DDIMER in the last 72 hours. Hgb A1c No results for input(s): HGBA1C in the last 72 hours. Lipid Profile No results for input(s): CHOL, HDL, LDLCALC, TRIG, CHOLHDL, LDLDIRECT in the last 72 hours. Thyroid function studies Recent Labs    10/24/20 0021  TSH 5.317*   Anemia work up Recent Labs    10/24/20 0021  VITAMINB12 241   Urinalysis    Component Value Date/Time   COLORURINE YELLOW 10/22/2020 1250   APPEARANCEUR CLOUDY (A) 10/22/2020 1250   LABSPEC 1.014 10/22/2020 1250   PHURINE 5.0 10/22/2020 1250   GLUCOSEU NEGATIVE 10/22/2020 1250   HGBUR MODERATE (A) 10/22/2020 1250   BILIRUBINUR NEGATIVE 10/22/2020 1250   BILIRUBINUR negative 05/01/2020 1707   KETONESUR 20  (A) 10/22/2020 1250   PROTEINUR 100 (A) 10/22/2020 1250   UROBILINOGEN 0.2 05/01/2020 1707   UROBILINOGEN 0.2 05/29/2013 1851   NITRITE POSITIVE (A) 10/22/2020 1250   LEUKOCYTESUR MODERATE (A) 10/22/2020 1250   Sepsis Labs Invalid input(s): PROCALCITONIN,  WBC,  LACTICIDVEN Microbiology Recent Results (from the past 240 hour(s))  Urine Culture     Status: Abnormal   Collection Time: 10/22/20 12:33 PM   Specimen: In/Out Cath Urine  Result Value Ref Range Status   Specimen Description IN/OUT CATH URINE  Final   Special Requests   Final    NONE Performed at [facility], [address], [place], [place] [address]    Culture >=100,000 COLONIES/mL ESCHERICHIA COLI (A)  Final   Report Status 10/24/2020 FINAL  Final   Organism ID, Bacteria ESCHERICHIA COLI (A)  Final      Susceptibility   Escherichia coli - MIC*    AMPICILLIN <=2 SENSITIVE Sensitive     CEFAZOLIN <=4 SENSITIVE Sensitive     CEFEPIME <=0.12 SENSITIVE Sensitive     CEFTRIAXONE <=0.25 SENSITIVE Sensitive     CIPROFLOXACIN <=0.25 SENSITIVE Sensitive     GENTAMICIN <=1 SENSITIVE Sensitive     IMIPENEM <=0.25 SENSITIVE Sensitive     NITROFURANTOIN <=16 SENSITIVE Sensitive     TRIMETH/SULFA <=20 SENSITIVE Sensitive     AMPICILLIN/SULBACTAM <=2 SENSITIVE Sensitive     PIP/TAZO <=4 SENSITIVE Sensitive     * >=100,000 COLONIES/mL ESCHERICHIA COLI  Blood Culture (routine x 2)     Status: None (Preliminary result)   Collection Time: 10/22/20 12:38 PM   Specimen: BLOOD RIGHT WRIST  Result Value Ref Range Status   Specimen Description BLOOD RIGHT WRIST  Final   Special Requests   Final    BOTTLES DRAWN AEROBIC AND ANAEROBIC Blood Culture results may not be optimal due to an inadequate volume of blood received in culture bottles   Culture   Final    NO GROWTH 3 DAYS Performed at [facility], [address], [place], [place] [address]    Report Status PENDING  Incomplete  Blood Culture (routine x 2)     Status:  Abnormal   Collection Time: 10/22/20 12:39 PM   Specimen: BLOOD RIGHT HAND  Result Value Ref Range Status   Specimen Description BLOOD RIGHT HAND  Final   Special Requests   Final    BOTTLES DRAWN AEROBIC AND ANAEROBIC Blood Culture results may not be optimal  due to an inadequate volume of blood received in culture bottles   Culture  Setup Time   Final    GRAM NEGATIVE RODS ANAEROBIC BOTTLE ONLY CRITICAL RESULT CALLED TO, READ BACK BY AND VERIFIED WITH: PHARMD JAMES LEDFORD 10/23/20'@07' :10 BY TW Performed at [facility], [place] [address] [place], [place] [address]    Culture ESCHERICHIA COLI (A)  Final   Report Status 10/25/2020 FINAL  Final   Organism ID, Bacteria ESCHERICHIA COLI  Final      Susceptibility   Escherichia coli - MIC*    AMPICILLIN <=2 SENSITIVE Sensitive     CEFAZOLIN <=4 SENSITIVE Sensitive     CEFEPIME <=0.12 SENSITIVE Sensitive     CEFTAZIDIME <=1 SENSITIVE Sensitive     CEFTRIAXONE <=0.25 SENSITIVE Sensitive     CIPROFLOXACIN <=0.25 SENSITIVE Sensitive     GENTAMICIN <=1 SENSITIVE Sensitive     IMIPENEM <=0.25 SENSITIVE Sensitive     TRIMETH/SULFA <=20 SENSITIVE Sensitive     AMPICILLIN/SULBACTAM <=2 SENSITIVE Sensitive     PIP/TAZO <=4 SENSITIVE Sensitive     * ESCHERICHIA COLI  Blood Culture ID Panel (Reflexed)     Status: Abnormal   Collection Time: 10/22/20 12:39 PM  Result Value Ref Range Status   Enterococcus faecalis NOT DETECTED NOT DETECTED Final   Enterococcus Faecium NOT DETECTED NOT DETECTED Final   Listeria monocytogenes NOT DETECTED NOT DETECTED Final   Staphylococcus species NOT DETECTED NOT DETECTED Final   Staphylococcus aureus (BCID) NOT DETECTED NOT DETECTED Final   Staphylococcus epidermidis NOT DETECTED NOT DETECTED Final   Staphylococcus lugdunensis NOT DETECTED NOT DETECTED Final   Streptococcus species NOT DETECTED NOT DETECTED Final   Streptococcus agalactiae NOT DETECTED NOT DETECTED Final   Streptococcus pneumoniae NOT  DETECTED NOT DETECTED Final   Streptococcus pyogenes NOT DETECTED NOT DETECTED Final   A.calcoaceticus-baumannii NOT DETECTED NOT DETECTED Final   Bacteroides fragilis NOT DETECTED NOT DETECTED Final   Enterobacterales DETECTED (A) NOT DETECTED Final    Comment: Enterobacterales represent a large order of gram negative bacteria, not a single organism. CRITICAL RESULT CALLED TO, READ BACK BY AND VERIFIED WITH: PHARMD JAMES LEDFORD 10/23/20'@07' :10 BY TW    Enterobacter cloacae complex NOT DETECTED NOT DETECTED Final   Escherichia coli DETECTED (A) NOT DETECTED Final    Comment: CRITICAL RESULT CALLED TO, READ BACK BY AND VERIFIED WITH: PHARMD JAMES LEDFORD 10/23/20'@07' :10 BY TW    Klebsiella aerogenes NOT DETECTED NOT DETECTED Final   Klebsiella oxytoca NOT DETECTED NOT DETECTED Final   Klebsiella pneumoniae NOT DETECTED NOT DETECTED Final   Proteus species NOT DETECTED NOT DETECTED Final   Salmonella species NOT DETECTED NOT DETECTED Final   Serratia marcescens NOT DETECTED NOT DETECTED Final   Haemophilus influenzae NOT DETECTED NOT DETECTED Final   Neisseria meningitidis NOT DETECTED NOT DETECTED Final   Pseudomonas aeruginosa NOT DETECTED NOT DETECTED Final   Stenotrophomonas maltophilia NOT DETECTED NOT DETECTED Final   Candida albicans NOT DETECTED NOT DETECTED Final   Candida auris NOT DETECTED NOT DETECTED Final   Candida glabrata NOT DETECTED NOT DETECTED Final   Candida krusei NOT DETECTED NOT DETECTED Final   Candida parapsilosis NOT DETECTED NOT DETECTED Final   Candida tropicalis NOT DETECTED NOT DETECTED Final   Cryptococcus neoformans/gattii NOT DETECTED NOT DETECTED Final   CTX-M ESBL NOT DETECTED NOT DETECTED Final   Carbapenem resistance IMP NOT DETECTED NOT DETECTED Final   Carbapenem resistance KPC NOT DETECTED NOT DETECTED Final   Carbapenem  resistance NDM NOT DETECTED NOT DETECTED Final   Carbapenem resist OXA 48 LIKE NOT DETECTED NOT DETECTED Final   Carbapenem  resistance VIM NOT DETECTED NOT DETECTED Final    Comment: Performed at [facility], [place] [address], [place], [place] [address]  Resp Panel by RT-PCR (Flu A&B, Covid) Nasopharyngeal Swab     Status: None   Collection Time: 10/22/20  1:05 PM   Specimen: Nasopharyngeal Swab; Nasopharyngeal(NP) swabs in vial transport medium  Result Value Ref Range Status   SARS Coronavirus 2 by RT PCR NEGATIVE NEGATIVE Final    Comment: (NOTE) SARS-CoV-2 target nucleic acids are NOT DETECTED.  The SARS-CoV-2 RNA is generally detectable in upper respiratory specimens during the acute phase of infection. The lowest concentration of SARS-CoV-2 viral copies this assay can detect is 138 copies/mL. A negative result does not preclude SARS-Cov-2 infection and should not be used as the sole basis for treatment or other patient management decisions. A negative result may occur with  improper specimen collection/handling, submission of specimen other than nasopharyngeal swab, presence of viral mutation(s) within the areas targeted by this assay, and inadequate number of viral copies(<138 copies/mL). A negative result must be combined with clinical observations, patient history, and epidemiological information. The expected result is Negative.  Fact Sheet for Patients:  EntrepreneurPulse.com.au  Fact Sheet for Healthcare Providers:  IncredibleEmployment.be  This test is no t yet approved or cleared by the [place] FDA and  has been authorized for detection and/or diagnosis of SARS-CoV-2 by FDA under an Emergency Use Authorization (EUA). This EUA will remain  in effect (meaning this test can be used) for the duration of the COVID-19 declaration under Section 564(b)(1) of the Act, 21 U.S.C.section 360bbb-3(b)(1), unless the authorization is terminated  or revoked sooner.       Influenza A by PCR NEGATIVE NEGATIVE Final   Influenza B by PCR NEGATIVE NEGATIVE  Final    Comment: (NOTE) The Xpert Xpress SARS-CoV-2/FLU/RSV plus assay is intended as an aid in the diagnosis of influenza from Nasopharyngeal swab specimens and should not be used as a sole basis for treatment. Nasal washings and aspirates are unacceptable for Xpert Xpress SARS-CoV-2/FLU/RSV testing.  Fact Sheet for Patients: EntrepreneurPulse.com.au  Fact Sheet for Healthcare Providers: IncredibleEmployment.be  This test is not yet approved or cleared by the [place] FDA and has been authorized for detection and/or diagnosis of SARS-CoV-2 by FDA under an Emergency Use Authorization (EUA). This EUA will remain in effect (meaning this test can be used) for the duration of the COVID-19 declaration under Section 564(b)(1) of the Act, 21 U.S.C. section 360bbb-3(b)(1), unless the authorization is terminated or revoked.  Performed at [facility], [place] [address] [place],  [address]     Time coordinating discharge: 25 minutes  SIGNED: Antonieta Pert, MD  [facility] 10/25/2020, 2:31 PM  If 7PM-7AM, please contact night-coverage [email or web address]

## 2020-10-25 NOTE — Chronic Care Management (AMB) (Signed)
  Chronic Care Management   Inpatient Admit Review Note  10/25/2020 Name: [name] MRN: [id number] DOB: 07-Jun-1940  [name] is a 80 y.o. year old female who is a primary care patient of Arnette Felts, FNP. [name] is actively engaged with the embedded care management team in the primary care practice and is being followed by RN Case Manager for assistance with disease management and care coordination needs related to  Recurrent Major Depression, Atherosclerosis Cardiovascular Disease, and Vitamin D Deficiency .   [name] is currently admitted to the hospital for evaluation and treatment of  Sepsis and Urinary Tract Infection .    Plan: CM team will collaborate with [facility] and will follow patient post discharge.     Bevelyn Ngo, BSW, CDP Social Worker, Certified Dementia Practitioner [facility] Care Management [phone]

## 2020-10-25 NOTE — Consult Note (Signed)
   [facility] Inpatient Consult   10/25/2020  ANGELIAH WISDOM Feb 21, 1941 [id number]  [facility] [[facility]]  Accountable Care Organization [ACO] Patient: Advertising copywriter Medicare  Primary Care Provider:  Arnette Felts, FNP an Embedded provider at [facility] for [facility] follow up and appointments  Notified of admission:  Embedded CCM social worker  Patient was screened for [facility] [[facility]]  Care Management services. Patient is active  Embedded practice in a chronic disease management Embedded Care Management team and program.  This writer was notified by the Embedded Child psychotherapist as this is the patient's 1st admission in the past 6 months noted. Patient to transition home with home health noted.  Patient lives alone but daughters to stay with her.  Plan: Notification to be sent to the [facility] Embedded Chronic Care Management team and made aware of above needs. Patient will be follow by her CCM team  Please contact for further questions,  Charlesetta Shanks, RN BSN CCM [facility]  [phone] business mobile phone Toll free office [phone]  Fax number: [phone] [email or web address]

## 2020-10-25 NOTE — Care Management Important Message (Signed)
Important Message  Patient Details  Name: [name] MRN: [id number] Date of Birth: 03-Jul-1940   Medicare Important Message Given:  Yes     Dorena Bodo 10/25/2020, 12:33 PM

## 2020-10-25 NOTE — Progress Notes (Deleted)
Physician Discharge Summary  [name] IHK:[id number] DOB: 1940-07-12 DOA: 10/22/2020  PCP: [name], FNP  Admit date: 10/22/2020 Discharge date: 10/25/2020  Admitted From: home Disposition:  HHPT  Recommendations for Outpatient Follow-up:  Follow up with PCP in 1-2 weeks Please obtain BMP/CBC in one week   Home Health:YES  Equipment/Devices: NO  Discharge Condition: Stable Code Status:   Code Status: Full Code Diet recommendation:  Diet Order             Diet Heart Room service appropriate? Yes with Assist; Fluid consistency: Thin  Diet effective now                    Brief/Interim Summary: 80 year old female with diastolic CHF, fibromyalgia, GERD abnormalities for her fall of the bed while sleeping the day prior to admission around 5:30 AM, might have had hit her head, sustained a small bruise in her tailbone and over the weekend had intermittent slurred speech and found confused by the family and brought to the ED.  In the ED febrile 104.5 UA consistent with UTI blood culture positive for E. coli.  Patient was admitted for E. coli sepsis secondary to UTI, acute metabolic encephalopathy. Patient was treated with IV fluids IV antibiotics.  Mental status is improved and appears at baseline. Seen by physical therapy occupational therapy and is being set up with home health at this time medically stable.  She will be discharged home once blood culture are back, urine culture with pansensitive E. Coli.  Discharge Diagnoses:   E. Coli severe sepsis  2/2 A secondary to  E coli UTI: Severe Sepsis  w/ POA.  Met severe sepsis criteria with tachycardia fever up to 105, tachypnea, E. coli bacteremia and acute encephalopathy.  Now resolved.  Renal ultrasound unremarkable.  Pansensitive E. coli in urine, pending blood culture before discharge.  Seen by ID.  Blood culture came back pansensitive, discussed with Dr. Graylon Good advises to switch her to amoxicillin 500 mg tud to complete  total 7 days course, 4 days left.  Acute metabolic encephalopathy on admission with dysarthria/slurred speech: CT head no acute finding secondary to #1.  Resolved.     Fall at home PT OT to continue   Hypokalemia: Replaced.     Metabolic acidosis: Resolved   Fibromyalgia Depression Continue on Zoloft, Topamax and Lyrica.   Anemia mild advise outpatient follow-up   Thrombocytopenia from sepsis.  Platelet counts improving.  Follow-up with PCP  Consults: ID  Subjective: Alert and oriented, back to baseline.  Would like to go home today.  Reports daughter lives with her. Discharge Exam: Vitals:   10/25/20 0341 10/25/20 0800  BP: 129/61 121/62  Pulse: 70 63  Resp: 20 18  Temp: 98.1 F (36.7 C) 98.3 F (36.8 C)  SpO2: 97% 95%   General: Pt is alert, awake, not in acute distress Cardiovascular: RRR, S1/S2 +, no rubs, no gallops Respiratory: CTA bilaterally, no wheezing, no rhonchi Abdominal: Soft, NT, ND, bowel sounds + Extremities: no edema, no cyanosis  Discharge Instructions  Discharge Instructions     Discharge instructions   Complete by: As directed    Please call call MD or return to ER for similar or worsening recurring problem that brought you to hospital or if any fever,nausea/vomiting,abdominal pain, uncontrolled pain, chest pain,  shortness of breath or any other alarming symptoms.  Please follow-up your doctor as instructed in a week time and call the office for appointment.  Please avoid alcohol,  smoking, or any other illicit substance and maintain healthy habits including taking your regular medications as prescribed.  You were cared for by a hospitalist during your hospital stay. If you have any questions about your discharge medications or the care you received while you were in the hospital after you are discharged, you can call the unit and ask to speak with the hospitalist on call if the hospitalist that took care of you is not available.  Once you  are discharged, your primary care physician will handle any further medical issues. Please note that NO REFILLS for any discharge medications will be authorized once you are discharged, as it is imperative that you return to your primary care physician (or establish a relationship with a primary care physician if you do not have one) for your aftercare needs so that they can reassess your need for medications and monitor your lab values   Increase activity slowly   Complete by: As directed       Allergies as of 10/25/2020       Reactions   Aspirin Anaphylaxis, Hives   Elavil [amitriptyline] Anaphylaxis   Latex    Relafen [nabumetone] Hives        Medication List     TAKE these medications    acetaminophen 500 MG tablet Commonly known as: TYLENOL Take 1,000 mg by mouth every 6 (six) hours as needed.   amoxicillin 500 MG capsule Commonly known as: AMOXIL Take 1 capsule (500 mg total) by mouth 3 (three) times daily for 4 days.   cetirizine 10 MG tablet Commonly known as: ZYRTEC Take 10 mg by mouth daily.   clopidogrel 75 MG tablet Commonly known as: PLAVIX TAKE 1 TABLET BY MOUTH EVERY DAY   fluticasone 50 MCG/ACT nasal spray Commonly known as: FLONASE Place into both nostrils daily as needed for allergies or rhinitis.   pregabalin 25 MG capsule Commonly known as: LYRICA Take 1 capsule 3 times daily   sertraline 50 MG tablet Commonly known as: ZOLOFT TAKE 1 TABLET BY MOUTH EVERY DAY   SYSTANE OP Apply to eye. As needed   topiramate 25 MG tablet Commonly known as: TOPAMAX Take one tablet in the morning and 3 in the evening   vitamin C 250 MG tablet Commonly known as: ASCORBIC ACID Take 250 mg by mouth daily.   Vitamin D 50 MCG (2000 UT) tablet Take 2,000 Units by mouth daily.               Durable Medical Equipment  (From admission, onward)           Start     Ordered   10/25/20 1046  For home use only DME 3 n 1  Once        10/25/20 1045    10/25/20 1044  For home use only DME standard manual wheelchair with seat cushion  Once       Comments: Patient suffers from E. Coli sepsis  2/2 A secondary to  E coli UTI which impairs their ability to perform daily activities like ambulating  in the home.  A cane  will not resolve issue with performing activities of daily living. A wheelchair will allow patient to safely perform daily activities. Patient can safely propel the wheelchair in the home or has a caregiver who can provide assistance. Length of need lifetime . Accessories: elevating leg rests (ELRs), wheel locks, extensions and anti-tippers.  Seat and back cushion   18 inch   10/25/20  [place], [facility] Follow up.   Specialty: Home Health Services Contact information: [place] [address] [phone]         [name], FNP Follow up in 1 week(s).   Specialty: General Practice Why: For CBC and BMP Contact information: [place] [address] [phone]                Allergies  Allergen Reactions   Aspirin Anaphylaxis and Hives   Elavil [Amitriptyline] Anaphylaxis   Latex    Relafen [Nabumetone] Hives    The results of significant diagnostics from this hospitalization (including imaging, microbiology, ancillary and laboratory) are listed below for reference.    Microbiology: Recent Results (from the past 240 hour(s))  Urine Culture     Status: Abnormal   Collection Time: 10/22/20 12:33 PM   Specimen: In/Out Cath Urine  Result Value Ref Range Status   Specimen Description IN/OUT CATH URINE  Final   Special Requests   Final    NONE Performed at [facility], [address] [place], [place] [address]    Culture >=100,000 COLONIES/mL ESCHERICHIA COLI (A)  Final   Report Status 10/24/2020 FINAL  Final   Organism ID, Bacteria ESCHERICHIA COLI (A)  Final      Susceptibility    Escherichia coli - MIC*    AMPICILLIN <=2 SENSITIVE Sensitive     CEFAZOLIN <=4 SENSITIVE Sensitive     CEFEPIME <=0.12 SENSITIVE Sensitive     CEFTRIAXONE <=0.25 SENSITIVE Sensitive     CIPROFLOXACIN <=0.25 SENSITIVE Sensitive     GENTAMICIN <=1 SENSITIVE Sensitive     IMIPENEM <=0.25 SENSITIVE Sensitive     NITROFURANTOIN <=16 SENSITIVE Sensitive     TRIMETH/SULFA <=20 SENSITIVE Sensitive     AMPICILLIN/SULBACTAM <=2 SENSITIVE Sensitive     PIP/TAZO <=4 SENSITIVE Sensitive     * >=100,000 COLONIES/mL ESCHERICHIA COLI  Blood Culture (routine x 2)     Status: None (Preliminary result)   Collection Time: 10/22/20 12:38 PM   Specimen: BLOOD RIGHT WRIST  Result Value Ref Range Status   Specimen Description BLOOD RIGHT WRIST  Final   Special Requests   Final    BOTTLES DRAWN AEROBIC AND ANAEROBIC Blood Culture results may not be optimal due to an inadequate volume of blood received in culture bottles   Culture   Final    NO GROWTH 2 DAYS Performed at [facility], [place] [address] [place], [place] [address]    Report Status PENDING  Incomplete  Blood Culture (routine x 2)     Status: Abnormal   Collection Time: 10/22/20 12:39 PM   Specimen: BLOOD RIGHT HAND  Result Value Ref Range Status   Specimen Description BLOOD RIGHT HAND  Final   Special Requests   Final    BOTTLES DRAWN AEROBIC AND ANAEROBIC Blood Culture results may not be optimal due to an inadequate volume of blood received in culture bottles   Culture  Setup Time   Final    GRAM NEGATIVE RODS ANAEROBIC BOTTLE ONLY CRITICAL RESULT CALLED TO, READ BACK BY AND VERIFIED WITH: PHARMD JAMES LEDFORD 10/23/20'@07' :10 BY TW Performed at [facility], [place] [address], [place], [place] [address]    Culture ESCHERICHIA COLI (A)  Final   Report Status 10/25/2020 FINAL  Final   Organism ID, Bacteria  ESCHERICHIA COLI  Final      Susceptibility   Escherichia coli - MIC*    AMPICILLIN <=2 SENSITIVE Sensitive     CEFAZOLIN  <=4 SENSITIVE Sensitive     CEFEPIME <=0.12 SENSITIVE Sensitive     CEFTAZIDIME <=1 SENSITIVE Sensitive     CEFTRIAXONE <=0.25 SENSITIVE Sensitive     CIPROFLOXACIN <=0.25 SENSITIVE Sensitive     GENTAMICIN <=1 SENSITIVE Sensitive     IMIPENEM <=0.25 SENSITIVE Sensitive     TRIMETH/SULFA <=20 SENSITIVE Sensitive     AMPICILLIN/SULBACTAM <=2 SENSITIVE Sensitive     PIP/TAZO <=4 SENSITIVE Sensitive     * ESCHERICHIA COLI  Blood Culture ID Panel (Reflexed)     Status: Abnormal   Collection Time: 10/22/20 12:39 PM  Result Value Ref Range Status   Enterococcus faecalis NOT DETECTED NOT DETECTED Final   Enterococcus Faecium NOT DETECTED NOT DETECTED Final   Listeria monocytogenes NOT DETECTED NOT DETECTED Final   Staphylococcus species NOT DETECTED NOT DETECTED Final   Staphylococcus aureus (BCID) NOT DETECTED NOT DETECTED Final   Staphylococcus epidermidis NOT DETECTED NOT DETECTED Final   Staphylococcus lugdunensis NOT DETECTED NOT DETECTED Final   Streptococcus species NOT DETECTED NOT DETECTED Final   Streptococcus agalactiae NOT DETECTED NOT DETECTED Final   Streptococcus pneumoniae NOT DETECTED NOT DETECTED Final   Streptococcus pyogenes NOT DETECTED NOT DETECTED Final   A.calcoaceticus-baumannii NOT DETECTED NOT DETECTED Final   Bacteroides fragilis NOT DETECTED NOT DETECTED Final   Enterobacterales DETECTED (A) NOT DETECTED Final    Comment: Enterobacterales represent a large order of gram negative bacteria, not a single organism. CRITICAL RESULT CALLED TO, READ BACK BY AND VERIFIED WITH: PHARMD JAMES LEDFORD 10/23/20'@07' :10 BY TW    Enterobacter cloacae complex NOT DETECTED NOT DETECTED Final   Escherichia coli DETECTED (A) NOT DETECTED Final    Comment: CRITICAL RESULT CALLED TO, READ BACK BY AND VERIFIED WITH: PHARMD JAMES LEDFORD 10/23/20'@07' :10 BY TW    Klebsiella aerogenes NOT DETECTED NOT DETECTED Final   Klebsiella oxytoca NOT DETECTED NOT DETECTED Final   Klebsiella  pneumoniae NOT DETECTED NOT DETECTED Final   Proteus species NOT DETECTED NOT DETECTED Final   Salmonella species NOT DETECTED NOT DETECTED Final   Serratia marcescens NOT DETECTED NOT DETECTED Final   Haemophilus influenzae NOT DETECTED NOT DETECTED Final   Neisseria meningitidis NOT DETECTED NOT DETECTED Final   Pseudomonas aeruginosa NOT DETECTED NOT DETECTED Final   Stenotrophomonas maltophilia NOT DETECTED NOT DETECTED Final   Candida albicans NOT DETECTED NOT DETECTED Final   Candida auris NOT DETECTED NOT DETECTED Final   Candida glabrata NOT DETECTED NOT DETECTED Final   Candida krusei NOT DETECTED NOT DETECTED Final   Candida parapsilosis NOT DETECTED NOT DETECTED Final   Candida tropicalis NOT DETECTED NOT DETECTED Final   Cryptococcus neoformans/gattii NOT DETECTED NOT DETECTED Final   CTX-M ESBL NOT DETECTED NOT DETECTED Final   Carbapenem resistance IMP NOT DETECTED NOT DETECTED Final   Carbapenem resistance KPC NOT DETECTED NOT DETECTED Final   Carbapenem resistance NDM NOT DETECTED NOT DETECTED Final   Carbapenem resist OXA 48 LIKE NOT DETECTED NOT DETECTED Final   Carbapenem resistance VIM NOT DETECTED NOT DETECTED Final    Comment: Performed at [facility], [place] [address] [place], [place] [address]  Resp Panel by RT-PCR (Flu A&B, Covid) Nasopharyngeal Swab     Status: None   Collection Time: 10/22/20  1:05 PM   Specimen: Nasopharyngeal Swab; Nasopharyngeal(NP) swabs in vial  transport medium  Result Value Ref Range Status   SARS Coronavirus 2 by RT PCR NEGATIVE NEGATIVE Final    Comment: (NOTE) SARS-CoV-2 target nucleic acids are NOT DETECTED.  The SARS-CoV-2 RNA is generally detectable in upper respiratory specimens during the acute phase of infection. The lowest concentration of SARS-CoV-2 viral copies this assay can detect is 138 copies/mL. A negative result does not preclude SARS-Cov-2 infection and should not be used as the sole basis for treatment  or other patient management decisions. A negative result may occur with  improper specimen collection/handling, submission of specimen other than nasopharyngeal swab, presence of viral mutation(s) within the areas targeted by this assay, and inadequate number of viral copies(<138 copies/mL). A negative result must be combined with clinical observations, patient history, and epidemiological information. The expected result is Negative.  Fact Sheet for Patients:  EntrepreneurPulse.com.au  Fact Sheet for Healthcare Providers:  IncredibleEmployment.be  This test is no t yet approved or cleared by the [place] FDA and  has been authorized for detection and/or diagnosis of SARS-CoV-2 by FDA under an Emergency Use Authorization (EUA). This EUA will remain  in effect (meaning this test can be used) for the duration of the COVID-19 declaration under Section 564(b)(1) of the Act, 21 U.S.C.section 360bbb-3(b)(1), unless the authorization is terminated  or revoked sooner.       Influenza A by PCR NEGATIVE NEGATIVE Final   Influenza B by PCR NEGATIVE NEGATIVE Final    Comment: (NOTE) The Xpert Xpress SARS-CoV-2/FLU/RSV plus assay is intended as an aid in the diagnosis of influenza from Nasopharyngeal swab specimens and should not be used as a sole basis for treatment. Nasal washings and aspirates are unacceptable for Xpert Xpress SARS-CoV-2/FLU/RSV testing.  Fact Sheet for Patients: EntrepreneurPulse.com.au  Fact Sheet for Healthcare Providers: IncredibleEmployment.be  This test is not yet approved or cleared by the [place] FDA and has been authorized for detection and/or diagnosis of SARS-CoV-2 by FDA under an Emergency Use Authorization (EUA). This EUA will remain in effect (meaning this test can be used) for the duration of the COVID-19 declaration under Section 564(b)(1) of the Act, 21 U.S.C. section  360bbb-3(b)(1), unless the authorization is terminated or revoked.  Performed at [facility], [place] [address] [place], [place] [address]     Procedures/Studies: CT HEAD WO CONTRAST (5MM)  Result Date: 10/22/2020 CLINICAL DATA:  Altered mental status with fever EXAM: CT HEAD WITHOUT CONTRAST TECHNIQUE: Contiguous axial images were obtained from the base of the skull through the vertex without intravenous contrast. COMPARISON:  CT brain 05/17/2020 FINDINGS: Brain: No acute territorial infarction, hemorrhage or intracranial mass. Patchy white matter hypodensity with chronic small vessel ischemic change. Nonenlarged ventricles. Vascular: No hyperdense vessel.  No unexpected calcification Skull: Normal. Negative for fracture or focal lesion. Sinuses/Orbits: No acute finding. Other: None IMPRESSION: 1. No CT evidence for acute intracranial abnormality. 2. Mild chronic small vessel ischemic change of the white matter Electronically Signed   By: Donavan Foil M.D.   On: 10/22/2020 18:34   US RENAL  Result Date: 10/24/2020 CLINICAL DATA:  UTI/sepsis. EXAM: RENAL / URINARY TRACT ULTRASOUND COMPLETE COMPARISON:  Abdominal ultrasound dated November 04, 2019. FINDINGS: Right Kidney: Renal measurements: 10.1 x 4.5 x 5.7 cm = volume: 155 mL. Echogenicity within normal limits. No mass or hydronephrosis visualized. Left Kidney: Renal measurements: 11.1 x 4.7 x 5.6 cm = volume: 154 mL. Echogenicity within normal limits. No mass or hydronephrosis visualized. Bladder: Appears normal for degree  of bladder distention. Other: None. IMPRESSION: 1. Normal renal ultrasound. Electronically Signed   By: Titus Dubin M.D.   On: 10/24/2020 12:03   DG Chest Port 1 View  Result Date: 10/22/2020 CLINICAL DATA:  Questionable sepsis EXAM: PORTABLE CHEST 1 VIEW COMPARISON:  Chest radiograph 01/12/2020 FINDINGS: The heart is at the upper limits of normal size, and the mediastinum appears prominent, both likely exaggerated by  AP technique and rightward patient rotation. Lung volumes are low. There is no congestion without over pulmonary edema. There is no focal consolidation. There is no pleural effusion. There is no pneumothorax. There is no acute osseous abnormality. There is degenerative change of both shoulders. IMPRESSION: Borderline cardiomegaly with vascular congestion but no overt pulmonary edema. Electronically Signed   By: Valetta Mole M.D.   On: 10/22/2020 13:04   Intravitreal Injection, Pharmacologic Agent - OD - Right Eye  Result Date: 10/01/2020 Time Out 10/01/2020. 2:13 PM. Confirmed correct patient, procedure, site, and patient consented. Anesthesia Topical anesthesia was used. Anesthetic medications included Akten 3.5%. Procedure Preparation included Ofloxacin , Tobramycin 0.3%, 10% betadine to eyelids, 5% betadine to ocular surface. A 30 gauge needle was used. Injection: 2.5 mg bevacizumab 2.5 MG/0.1ML   Route: Intravitreal, Site: Right Eye   NDC: [phone], Lot: [id number] Post-op Post injection exam found visual acuity of at least counting fingers. The patient tolerated the procedure well. There were no complications. The patient received written and verbal post procedure care education. Post injection medications were not given.   OCT, Retina - OU - Both Eyes  Result Date: 10/01/2020 Right Eye Quality was good. Scan locations included subfoveal. Central Foveal Thickness: 276. Progression has worsened. Findings include vitreous traction, no IRF, abnormal foveal contour, pigment epithelial detachment, subretinal fluid. Left Eye Quality was good. Scan locations included subfoveal. Central Foveal Thickness: 270. Progression has been stable. Findings include retinal drusen . Notes Small residual cyst serous subretinal detachment temporal to the fovea much improved though compared to August, OD vastly improved overall.  Apparent posterior vitreous detachment now OS with incidental posterior vitreous detachment    Labs: BNP (last 3 results) No results for input(s): BNP in the last 8760 hours. Basic Metabolic Panel: Recent Labs  Lab 10/22/20 1245 10/22/20 1255 10/23/20 0204 10/24/20 0021 10/25/20 0324  NA 135 137 136 137  --   K 3.1* 3.1* 3.1* 3.4* 4.3  CL 106  --  108 109  --   CO2 18*  --  19* 23  --   GLUCOSE 138*  --  112* 104*  --   BUN 22  --  19 16  --   CREATININE 1.09*  --  0.96 0.80  --   CALCIUM 8.3*  --  7.9* 8.2*  --   MG  --   --   --  1.7  --    Liver Function Tests: Recent Labs  Lab 10/22/20 1245  AST 61*  ALT 21  ALKPHOS 66  BILITOT 1.0  PROT 5.8*  ALBUMIN 3.0*   Recent Labs  Lab 10/22/20 1245  LIPASE 21   No results for input(s): AMMONIA in the last 168 hours. CBC: Recent Labs  Lab 10/22/20 1245 10/22/20 1255 10/23/20 0204 10/24/20 0021 10/25/20 0324  WBC 5.7  --  4.1 6.4 6.3  NEUTROABS 5.5  --   --   --   --   HGB 12.8 11.9* 11.4* 10.4* 10.9*  HCT 39.0 35.0* 34.5* 31.1* 31.8*  MCV 94.0  --  93.0  91.2 89.3  PLT 127*  --  101* 112* 126*   Cardiac Enzymes: No results for input(s): CKTOTAL, CKMB, CKMBINDEX, TROPONINI in the last 168 hours. BNP: Invalid input(s): POCBNP CBG: No results for input(s): GLUCAP in the last 168 hours. D-Dimer No results for input(s): DDIMER in the last 72 hours. Hgb A1c No results for input(s): HGBA1C in the last 72 hours. Lipid Profile No results for input(s): CHOL, HDL, LDLCALC, TRIG, CHOLHDL, LDLDIRECT in the last 72 hours. Thyroid function studies Recent Labs    10/24/20 0021  TSH 5.317*   Anemia work up Recent Labs    10/24/20 0021  VITAMINB12 241   Urinalysis    Component Value Date/Time   COLORURINE YELLOW 10/22/2020 1250   APPEARANCEUR CLOUDY (A) 10/22/2020 1250   LABSPEC 1.014 10/22/2020 1250   PHURINE 5.0 10/22/2020 1250   GLUCOSEU NEGATIVE 10/22/2020 1250   HGBUR MODERATE (A) 10/22/2020 1250   BILIRUBINUR NEGATIVE 10/22/2020 1250   BILIRUBINUR negative 05/01/2020 1707   KETONESUR 20  (A) 10/22/2020 1250   PROTEINUR 100 (A) 10/22/2020 1250   UROBILINOGEN 0.2 05/01/2020 1707   UROBILINOGEN 0.2 05/29/2013 1851   NITRITE POSITIVE (A) 10/22/2020 1250   LEUKOCYTESUR MODERATE (A) 10/22/2020 1250   Sepsis Labs Invalid input(s): PROCALCITONIN,  WBC,  LACTICIDVEN Microbiology Recent Results (from the past 240 hour(s))  Urine Culture     Status: Abnormal   Collection Time: 10/22/20 12:33 PM   Specimen: In/Out Cath Urine  Result Value Ref Range Status   Specimen Description IN/OUT CATH URINE  Final   Special Requests   Final    NONE Performed at [facility], [address], [place], [place] [address]    Culture >=100,000 COLONIES/mL ESCHERICHIA COLI (A)  Final   Report Status 10/24/2020 FINAL  Final   Organism ID, Bacteria ESCHERICHIA COLI (A)  Final      Susceptibility   Escherichia coli - MIC*    AMPICILLIN <=2 SENSITIVE Sensitive     CEFAZOLIN <=4 SENSITIVE Sensitive     CEFEPIME <=0.12 SENSITIVE Sensitive     CEFTRIAXONE <=0.25 SENSITIVE Sensitive     CIPROFLOXACIN <=0.25 SENSITIVE Sensitive     GENTAMICIN <=1 SENSITIVE Sensitive     IMIPENEM <=0.25 SENSITIVE Sensitive     NITROFURANTOIN <=16 SENSITIVE Sensitive     TRIMETH/SULFA <=20 SENSITIVE Sensitive     AMPICILLIN/SULBACTAM <=2 SENSITIVE Sensitive     PIP/TAZO <=4 SENSITIVE Sensitive     * >=100,000 COLONIES/mL ESCHERICHIA COLI  Blood Culture (routine x 2)     Status: None (Preliminary result)   Collection Time: 10/22/20 12:38 PM   Specimen: BLOOD RIGHT WRIST  Result Value Ref Range Status   Specimen Description BLOOD RIGHT WRIST  Final   Special Requests   Final    BOTTLES DRAWN AEROBIC AND ANAEROBIC Blood Culture results may not be optimal due to an inadequate volume of blood received in culture bottles   Culture   Final    NO GROWTH 2 DAYS Performed at [facility], [place] [address] [place], [place] [address]    Report Status PENDING  Incomplete  Blood Culture (routine x 2)     Status:  Abnormal   Collection Time: 10/22/20 12:39 PM   Specimen: BLOOD RIGHT HAND  Result Value Ref Range Status   Specimen Description BLOOD RIGHT HAND  Final   Special Requests   Final    BOTTLES DRAWN AEROBIC AND ANAEROBIC Blood Culture results may not be optimal  due to an inadequate volume of blood received in culture bottles   Culture  Setup Time   Final    GRAM NEGATIVE RODS ANAEROBIC BOTTLE ONLY CRITICAL RESULT CALLED TO, READ BACK BY AND VERIFIED WITH: PHARMD JAMES LEDFORD 10/23/20'@07' :10 BY TW Performed at [facility], [place] [address], [place], [place] [address]    Culture ESCHERICHIA COLI (A)  Final   Report Status 10/25/2020 FINAL  Final   Organism ID, Bacteria ESCHERICHIA COLI  Final      Susceptibility   Escherichia coli - MIC*    AMPICILLIN <=2 SENSITIVE Sensitive     CEFAZOLIN <=4 SENSITIVE Sensitive     CEFEPIME <=0.12 SENSITIVE Sensitive     CEFTAZIDIME <=1 SENSITIVE Sensitive     CEFTRIAXONE <=0.25 SENSITIVE Sensitive     CIPROFLOXACIN <=0.25 SENSITIVE Sensitive     GENTAMICIN <=1 SENSITIVE Sensitive     IMIPENEM <=0.25 SENSITIVE Sensitive     TRIMETH/SULFA <=20 SENSITIVE Sensitive     AMPICILLIN/SULBACTAM <=2 SENSITIVE Sensitive     PIP/TAZO <=4 SENSITIVE Sensitive     * ESCHERICHIA COLI  Blood Culture ID Panel (Reflexed)     Status: Abnormal   Collection Time: 10/22/20 12:39 PM  Result Value Ref Range Status   Enterococcus faecalis NOT DETECTED NOT DETECTED Final   Enterococcus Faecium NOT DETECTED NOT DETECTED Final   Listeria monocytogenes NOT DETECTED NOT DETECTED Final   Staphylococcus species NOT DETECTED NOT DETECTED Final   Staphylococcus aureus (BCID) NOT DETECTED NOT DETECTED Final   Staphylococcus epidermidis NOT DETECTED NOT DETECTED Final   Staphylococcus lugdunensis NOT DETECTED NOT DETECTED Final   Streptococcus species NOT DETECTED NOT DETECTED Final   Streptococcus agalactiae NOT DETECTED NOT DETECTED Final   Streptococcus pneumoniae NOT  DETECTED NOT DETECTED Final   Streptococcus pyogenes NOT DETECTED NOT DETECTED Final   A.calcoaceticus-baumannii NOT DETECTED NOT DETECTED Final   Bacteroides fragilis NOT DETECTED NOT DETECTED Final   Enterobacterales DETECTED (A) NOT DETECTED Final    Comment: Enterobacterales represent a large order of gram negative bacteria, not a single organism. CRITICAL RESULT CALLED TO, READ BACK BY AND VERIFIED WITH: PHARMD JAMES LEDFORD 10/23/20'@07' :10 BY TW    Enterobacter cloacae complex NOT DETECTED NOT DETECTED Final   Escherichia coli DETECTED (A) NOT DETECTED Final    Comment: CRITICAL RESULT CALLED TO, READ BACK BY AND VERIFIED WITH: PHARMD JAMES LEDFORD 10/23/20'@07' :10 BY TW    Klebsiella aerogenes NOT DETECTED NOT DETECTED Final   Klebsiella oxytoca NOT DETECTED NOT DETECTED Final   Klebsiella pneumoniae NOT DETECTED NOT DETECTED Final   Proteus species NOT DETECTED NOT DETECTED Final   Salmonella species NOT DETECTED NOT DETECTED Final   Serratia marcescens NOT DETECTED NOT DETECTED Final   Haemophilus influenzae NOT DETECTED NOT DETECTED Final   Neisseria meningitidis NOT DETECTED NOT DETECTED Final   Pseudomonas aeruginosa NOT DETECTED NOT DETECTED Final   Stenotrophomonas maltophilia NOT DETECTED NOT DETECTED Final   Candida albicans NOT DETECTED NOT DETECTED Final   Candida auris NOT DETECTED NOT DETECTED Final   Candida glabrata NOT DETECTED NOT DETECTED Final   Candida krusei NOT DETECTED NOT DETECTED Final   Candida parapsilosis NOT DETECTED NOT DETECTED Final   Candida tropicalis NOT DETECTED NOT DETECTED Final   Cryptococcus neoformans/gattii NOT DETECTED NOT DETECTED Final   CTX-M ESBL NOT DETECTED NOT DETECTED Final   Carbapenem resistance IMP NOT DETECTED NOT DETECTED Final   Carbapenem resistance KPC NOT DETECTED NOT DETECTED Final   Carbapenem  resistance NDM NOT DETECTED NOT DETECTED Final   Carbapenem resist OXA 48 LIKE NOT DETECTED NOT DETECTED Final   Carbapenem  resistance VIM NOT DETECTED NOT DETECTED Final    Comment: Performed at [facility], [place] [address] [place], [place] [address]  Resp Panel by RT-PCR (Flu A&B, Covid) Nasopharyngeal Swab     Status: None   Collection Time: 10/22/20  1:05 PM   Specimen: Nasopharyngeal Swab; Nasopharyngeal(NP) swabs in vial transport medium  Result Value Ref Range Status   SARS Coronavirus 2 by RT PCR NEGATIVE NEGATIVE Final    Comment: (NOTE) SARS-CoV-2 target nucleic acids are NOT DETECTED.  The SARS-CoV-2 RNA is generally detectable in upper respiratory specimens during the acute phase of infection. The lowest concentration of SARS-CoV-2 viral copies this assay can detect is 138 copies/mL. A negative result does not preclude SARS-Cov-2 infection and should not be used as the sole basis for treatment or other patient management decisions. A negative result may occur with  improper specimen collection/handling, submission of specimen other than nasopharyngeal swab, presence of viral mutation(s) within the areas targeted by this assay, and inadequate number of viral copies(<138 copies/mL). A negative result must be combined with clinical observations, patient history, and epidemiological information. The expected result is Negative.  Fact Sheet for Patients:  EntrepreneurPulse.com.au  Fact Sheet for Healthcare Providers:  IncredibleEmployment.be  This test is no t yet approved or cleared by the [place] FDA and  has been authorized for detection and/or diagnosis of SARS-CoV-2 by FDA under an Emergency Use Authorization (EUA). This EUA will remain  in effect (meaning this test can be used) for the duration of the COVID-19 declaration under Section 564(b)(1) of the Act, 21 U.S.C.section 360bbb-3(b)(1), unless the authorization is terminated  or revoked sooner.       Influenza A by PCR NEGATIVE NEGATIVE Final   Influenza B by PCR NEGATIVE NEGATIVE  Final    Comment: (NOTE) The Xpert Xpress SARS-CoV-2/FLU/RSV plus assay is intended as an aid in the diagnosis of influenza from Nasopharyngeal swab specimens and should not be used as a sole basis for treatment. Nasal washings and aspirates are unacceptable for Xpert Xpress SARS-CoV-2/FLU/RSV testing.  Fact Sheet for Patients: EntrepreneurPulse.com.au  Fact Sheet for Healthcare Providers: IncredibleEmployment.be  This test is not yet approved or cleared by the [place] FDA and has been authorized for detection and/or diagnosis of SARS-CoV-2 by FDA under an Emergency Use Authorization (EUA). This EUA will remain in effect (meaning this test can be used) for the duration of the COVID-19 declaration under Section 564(b)(1) of the Act, 21 U.S.C. section 360bbb-3(b)(1), unless the authorization is terminated or revoked.  Performed at [facility], [place] [address], [place], [place] [address]      Time coordinating discharge: 25 minutes  SIGNED: Antonieta Pert, MD  [facility] 10/25/2020, 10:50 AM  If 7PM-7AM, please contact night-coverage [email or web address]

## 2020-10-25 NOTE — Care Management (Signed)
    Durable Medical Equipment  (From admission, onward)           Start     Ordered   10/25/20 1046  For home use only DME 3 n 1  Once        10/25/20 1045   10/25/20 1044  For home use only DME standard manual wheelchair with seat cushion  Once       Comments: Patient suffers from E. Coli sepsis  2/2 A secondary to  E coli UTI which impairs their ability to perform daily activities like ambulating  in the home.  A cane  will not resolve issue with performing activities of daily living. A wheelchair will allow patient to safely perform daily activities. Patient can safely propel the wheelchair in the home or has a caregiver who can provide assistance. Length of need lifetime . Accessories: elevating leg rests (ELRs), wheel locks, extensions and anti-tippers.  Seat and back cushion   18 inch   10/25/20 1045

## 2020-10-25 NOTE — Care Management (Signed)
PT had recommended a 18 inch wheel chair and 3 in1. NCM followed up with daughter [name]. [name] has measured her door ways, the 18 inch wheel chair will fit through all the interior doorways. Ordered wheel chair and 3 in1 with [place]field Citizen with [employer]. They will be brought to hospital room. [name] aware possible discharge today.

## 2020-10-25 NOTE — Progress Notes (Signed)
[facility]    Date of Admission:  10/22/2020   Total days of antibiotics 4   ID: [name] is a 80 y.o. female with  ecoli bacteremia and pyelonephritis Principal Problem:   Sepsis secondary to UTI [facility]) Active Problems:   Fibromyalgia   Depression   Hypokalemia   Dysarthria   Acute metabolic encephalopathy    Subjective: Afebrile, RN reports only soft stools, no diarrhea. Patient denies abdominal pain or dysuria  Ros: 12 point ros is negative Medications:   acetaminophen  650 mg Rectal Once   clopidogrel  75 mg Oral Daily   enoxaparin (LOVENOX) injection  40 mg Subcutaneous QHS   loratadine  10 mg Oral Daily   potassium chloride  20 mEq Oral BID   pregabalin  25 mg Oral TID   sertraline  50 mg Oral Daily   sodium chloride flush  3 mL Intravenous Q12H   topiramate  25-75 mg Oral BID    Objective: Vital signs in last 24 hours: Temp:  [98.1 F (36.7 C)-98.6 F (37 C)] 98.3 F (36.8 C) (08/18 0800) Pulse Rate:  [63-70] 63 (08/18 0800) Resp:  [18-20] 18 (08/18 0800) BP: (121-129)/(61-65) 121/62 (08/18 0800) SpO2:  [95 %-97 %] 95 % (08/18 0800) Weight:  [77.2 kg] 77.2 kg (08/18 0446)  Physical Exam  Constitutional:  oriented to person, place, and time. appears well-developed and well-nourished. No distress.  HENT: [place]/AT, PERRLA, no scleral icterus Mouth/Throat: Oropharynx is clear and moist. No oropharyngeal exudate.  Cardiovascular: Normal rate, regular rhythm and normal heart sounds. Exam reveals no gallop and no friction rub.  No murmur heard.  Pulmonary/Chest: Effort normal and breath sounds normal. No respiratory distress.  has no wheezes.  Neck = supple, no nuchal rigidity Abdominal: Soft. Bowel sounds are normal.  exhibits no distension. There is no tenderness.  Lymphadenopathy: no cervical adenopathy. No axillary adenopathy Neurological: alert and oriented to person, place, and time.  Skin: Skin is warm and dry. No rash  noted. No erythema.  Psychiatric: a normal mood and affect.  behavior is normal.    Lab Results Recent Labs    10/23/20 0204 10/24/20 0021 10/25/20 0324  WBC 4.1 6.4 6.3  HGB 11.4* 10.4* 10.9*  HCT 34.5* 31.1* 31.8*  NA 136 137  --   K 3.1* 3.4* 4.3  CL 108 109  --   CO2 19* 23  --   BUN 19 16  --   CREATININE 0.96 0.80  --    Liver Panel No results for input(s): PROT, ALBUMIN, AST, ALT, ALKPHOS, BILITOT, BILIDIR, IBILI in the last 72 hours. Sedimentation Rate No results for input(s): ESRSEDRATE in the last 72 hours. C-Reactive Protein No results for input(s): CRP in the last 72 hours.  Microbiology: Reviewed. Pansensitive ecoli Studies/Results: US RENAL  Result Date: 10/24/2020 CLINICAL DATA:  UTI/sepsis. EXAM: RENAL / URINARY TRACT ULTRASOUND COMPLETE COMPARISON:  Abdominal ultrasound dated November 04, 2019. FINDINGS: Right Kidney: Renal measurements: 10.1 x 4.5 x 5.7 cm = volume: 155 mL. Echogenicity within normal limits. No mass or hydronephrosis visualized. Left Kidney: Renal measurements: 11.1 x 4.7 x 5.6 cm = volume: 154 mL. Echogenicity within normal limits. No mass or hydronephrosis visualized. Bladder: Appears normal for degree of bladder distention. Other: None. IMPRESSION: 1. Normal renal ultrasound. Electronically Signed   By: Obie Dredge M.D.   On: 10/24/2020 12:03     Assessment/Plan: E.coli bacteremia and pyelonephritis = appears much improved. Plan  to convert over to amoxicillin 500mg  TID to finish out 7 day course of treatment.  Loose stools = does not appear c/w cdifficile. Recommend supportive care.consider immodium if it worsens.  [facility] Cell: [phone] Pager: [phone]  10/25/2020, 1:52 PM    '

## 2020-10-25 NOTE — Telephone Encounter (Signed)
Transition Care Management Unsuccessful Follow-up Telephone Call  Date of discharge and from where:  10/25/2020   Attempts:  1st Attempt  Reason for unsuccessful TCM follow-up call:  Left voice message

## 2020-10-25 NOTE — Telephone Encounter (Signed)
Transition Care Management Unsuccessful Follow-up Telephone Call  Date of discharge and from where:  10/25/2020  Attempts:  2nd Attempt  Reason for unsuccessful TCM follow-up call:  No answer/busy

## 2020-10-26 DIAGNOSIS — I5032 Chronic diastolic (congestive) heart failure: Secondary | ICD-10-CM | POA: Diagnosis not present

## 2020-10-26 DIAGNOSIS — B9620 Unspecified Escherichia coli [E. coli] as the cause of diseases classified elsewhere: Secondary | ICD-10-CM | POA: Diagnosis not present

## 2020-10-26 DIAGNOSIS — I110 Hypertensive heart disease with heart failure: Secondary | ICD-10-CM | POA: Diagnosis not present

## 2020-10-26 DIAGNOSIS — A4151 Sepsis due to Escherichia coli [E. coli]: Secondary | ICD-10-CM | POA: Diagnosis not present

## 2020-10-26 DIAGNOSIS — N390 Urinary tract infection, site not specified: Secondary | ICD-10-CM | POA: Diagnosis not present

## 2020-10-26 DIAGNOSIS — R471 Dysarthria and anarthria: Secondary | ICD-10-CM | POA: Diagnosis not present

## 2020-10-27 LAB — CULTURE, BLOOD (ROUTINE X 2): Culture: NO GROWTH

## 2020-10-29 ENCOUNTER — Ambulatory Visit: Payer: Medicare Other | Admitting: Nurse Practitioner

## 2020-10-29 ENCOUNTER — Telehealth: Payer: Self-pay

## 2020-10-29 NOTE — Telephone Encounter (Signed)
Transition Care Management Unsuccessful Follow-up Telephone Call  Date of discharge and from where: [facility] 10/25/2020  Attempts:  3rd Attempt  Reason for unsuccessful TCM follow-up call:  Unable to reach patient Patient's daughter stated that the pt was in the bed.

## 2020-10-29 NOTE — Telephone Encounter (Signed)
Transition Care Management Follow-up Telephone Call Date of discharge and from where:  [facility] 10/25/2020 How have you been since you were released from the hospital? Tired Any questions or concerns? No  Items Reviewed: Did the pt receive and understand the discharge instructions provided? Yes  Medications obtained and verified? Yes  Other? No  Any new allergies since your discharge? No  Dietary orders reviewed? No Do you have support at home? Yes   Home Care and Equipment/Supplies: Were home health services ordered? yes If so, what is the name of the agency? [facility]  Has the agency set up a time to come to the patient's home? yes Were any new equipment or medical supplies ordered?  No What is the name of the medical supply agency?  Were you able to get the supplies/equipment? not applicable Do you have any questions related to the use of the equipment or supplies? No  Functional Questionnaire: (I = Independent and D = Dependent) ADLs: I  Bathing/Dressing- I  Meal Prep- I  Eating- I  Maintaining continence- I  Transferring/Ambulation- I  Managing Meds- I  Follow up appointments reviewed:  PCP Hospital f/u appt confirmed? Yes  Scheduled to see Christell Constant, DNP, FNP-BC on 08/ @ 11. Specialist Hospital f/u appt confirmed? No   Are transportation arrangements needed? No  If their condition worsens, is the pt aware to call PCP or go to the Emergency Dept.? Yes Was the patient provided with contact information for the PCP's office or ED? Yes Was to pt encouraged to call back with questions or concerns? Yes

## 2020-10-30 DIAGNOSIS — I110 Hypertensive heart disease with heart failure: Secondary | ICD-10-CM | POA: Diagnosis not present

## 2020-10-30 DIAGNOSIS — I5032 Chronic diastolic (congestive) heart failure: Secondary | ICD-10-CM | POA: Diagnosis not present

## 2020-10-30 DIAGNOSIS — N390 Urinary tract infection, site not specified: Secondary | ICD-10-CM | POA: Diagnosis not present

## 2020-10-31 ENCOUNTER — Ambulatory Visit (INDEPENDENT_AMBULATORY_CARE_PROVIDER_SITE_OTHER): Payer: Medicare Other

## 2020-10-31 DIAGNOSIS — F3341 Major depressive disorder, recurrent, in partial remission: Secondary | ICD-10-CM | POA: Diagnosis not present

## 2020-10-31 DIAGNOSIS — I2510 Atherosclerotic heart disease of native coronary artery without angina pectoris: Secondary | ICD-10-CM

## 2020-10-31 DIAGNOSIS — A419 Sepsis, unspecified organism: Secondary | ICD-10-CM

## 2020-10-31 DIAGNOSIS — N390 Urinary tract infection, site not specified: Secondary | ICD-10-CM

## 2020-10-31 NOTE — Chronic Care Management (AMB) (Signed)
  Care Management    Consult Note  10/31/2020 Name: FARRIN SHADLE MRN: [id number] DOB: [birth year]  Care management team received notification of patient's recent inpatient hospitalization related to  Sepsis  .Based on review of health record, ETTEL ALBERGO is currently active in the embedded care coordination program.. Outbound call placed to the patient to assist with care coordination needs. Spoke with the patients daughter Orbie Hurst.  Review of patient status, including review of consultants reports, relevant laboratory and other test results, and collaboration with appropriate care team members and the patient's provider was performed as part of comprehensive patient evaluation and provision of chronic care management services.    SW assessed for acute care coordination needs. Discussed the patients daughter is staying in the home Monday through Friday and returning to her own home on weekends. She reports she plans to continue this routine until a medical provider tells her it is safe for the patient to be without assistance. The patient is active with home health and making improvements. She is walking well throughout the home. Determined the patient is weighing herself each morning and recording.  Discussed the family does have some questions surrounding kidney function and what to do if the patient gains or losses weight overnight. Performed chart review to note patient has an office visit with her primary provider on 8/25. Advised Mrs. West to write all questions down and bring with her to the patients appointment.  Noted an upcomming call with RN Care Manager scheduled for 8/29. Informed Mrs. [facility] of RN Care Managers role and appointment date. Collaboration with RN Care Manager to advise of patients progress since returning home.    Plan: SW will follow up with patient by phone over the next 90 days.  Bevelyn Ngo, BSW, CDP Social Worker, Certified Dementia Practitioner [facility]  Care Management [phone]

## 2020-11-01 ENCOUNTER — Other Ambulatory Visit: Payer: Self-pay | Admitting: Nurse Practitioner

## 2020-11-01 ENCOUNTER — Ambulatory Visit (INDEPENDENT_AMBULATORY_CARE_PROVIDER_SITE_OTHER): Payer: Medicare Other | Admitting: Nurse Practitioner

## 2020-11-01 ENCOUNTER — Other Ambulatory Visit: Payer: Self-pay

## 2020-11-01 ENCOUNTER — Encounter: Payer: Self-pay | Admitting: Nurse Practitioner

## 2020-11-01 VITALS — BP 116/82 | HR 76 | Temp 98.0°F | Ht 64.0 in | Wt 158.2 lb

## 2020-11-01 DIAGNOSIS — A419 Sepsis, unspecified organism: Secondary | ICD-10-CM | POA: Diagnosis not present

## 2020-11-01 DIAGNOSIS — Z09 Encounter for follow-up examination after completed treatment for conditions other than malignant neoplasm: Secondary | ICD-10-CM

## 2020-11-01 DIAGNOSIS — E876 Hypokalemia: Secondary | ICD-10-CM | POA: Diagnosis not present

## 2020-11-01 DIAGNOSIS — N390 Urinary tract infection, site not specified: Secondary | ICD-10-CM | POA: Diagnosis not present

## 2020-11-01 LAB — POCT URINALYSIS DIPSTICK
Bilirubin, UA: NEGATIVE
Blood, UA: NEGATIVE
Glucose, UA: NEGATIVE
Ketones, UA: NEGATIVE
Leukocytes, UA: NEGATIVE
Nitrite, UA: NEGATIVE
Protein, UA: POSITIVE — AB
Spec Grav, UA: >=1.03 — AB (ref 1.010–1.025)
Urobilinogen, UA: 0.2 E.U./dL
pH, UA: 5.5 (ref 5.0–8.0)

## 2020-11-01 NOTE — Progress Notes (Signed)
I,Katawbba Wiggins,acting as a Education administrator for [employer], FNP.,have documented all relevant documentation on the behalf of [name], FNP,as directed by  [name], FNP while in the presence of [name], [place].   This visit occurred during the SARS-CoV-2 public health emergency.  Safety protocols were in place, including screening questions prior to the visit, additional usage of staff PPE, and extensive cleaning of exam room while observing appropriate contact time as indicated for disinfecting solutions.  Subjective:     Patient ID: [name] , female    DOB: June 18, 1940 , 80 y.o.   MRN: [id number]   Chief Complaint  Patient presents with   Hospitalization Follow-up    HPI  The patient is here today for a hospital follow-up after being admitted from 8/15-8/18.  She had been having confusion and was taken to the hospital with encephalopathy. She also was having problems walking, was also weak while walking to the bathroom, she began mumbling. She also had  fever of 104.5, she was diagnosed with a UTI with e. Coli. And was found to have e. Coli sepsis with acute metabolic encephalopathy. Once her Blood culture came back pansensitive, and was treated with  amoxicillin 500 mg tid to complete total 7 days course, 4 days left.  After receiving IV fluids she had improved mentation  She is now at home and has OT/PT, her daughter is requesting a bench transfer shower chair. Her family has been staying with her so she is not alone. 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     Family History  Problem  Relation Age of Onset   CVA Mother    Stroke Father    Emphysema Father      Current Outpatient Medications:    acetaminophen (TYLENOL) 500 MG tablet, Take 1,000 mg by mouth every 6 (six) hours as needed., Disp: , Rfl:    cetirizine (ZYRTEC) 10 MG tablet, Take 10 mg by mouth daily., Disp: , Rfl:    Cholecalciferol (VITAMIN D) 2000 units tablet, Take 2,000 Units by mouth daily., Disp: , Rfl:    clopidogrel (PLAVIX) 75 MG tablet, TAKE 1 TABLET BY MOUTH EVERY DAY, Disp: 90 tablet, Rfl: 1   fluticasone (FLONASE) 50 MCG/ACT nasal spray, Place into both nostrils daily as needed for allergies or rhinitis., Disp: , Rfl:    Polyethyl Glycol-Propyl Glycol (SYSTANE OP), Apply to eye. As needed, Disp: , Rfl:    topiramate (TOPAMAX) 25 MG tablet, Take one tablet in the morning and 3 in the evening, Disp: 360 tablet, Rfl: 3   vitamin C (ASCORBIC ACID) 250 MG tablet, Take 250 mg by mouth daily. , Disp: , Rfl:    pregabalin (LYRICA) 25 MG capsule, Take 1 capsule 3 times daily, Disp: 270 capsule, Rfl: 1   sertraline (ZOLOFT) 50 MG tablet, TAKE 1 TABLET BY MOUTH EVERY DAY, Disp: 90 tablet, Rfl: 1   Allergies  Allergen Reactions   Aspirin Anaphylaxis and Hives   Elavil [Amitriptyline] Anaphylaxis   Latex    Relafen [Nabumetone] Hives     Review of  Systems  Constitutional: Negative.   Respiratory: Negative.    Cardiovascular: Negative.  Negative for chest pain, palpitations and leg swelling.  Gastrointestinal: Negative.   Neurological:  Negative for dizziness and headaches.  Psychiatric/Behavioral: Negative.    All other systems reviewed and are negative.   Today's Vitals   11/01/20 1043  BP: 116/82  Pulse: 76  Temp: 98 F (36.7 C)  TempSrc: Oral  Weight: 158 lb 3.2 oz (71.8 kg)  Height: '5\' 4"'  (1.626 m)   Body mass index is 27.15 kg/m.  Wt Readings from Last 3 Encounters:  11/01/20 158 lb 3.2 oz (71.8 kg)  10/25/20 170 lb 3.2 oz (77.2 kg)  09/27/20 150 lb (68 kg)    BP Readings from  Last 3 Encounters:  11/01/20 116/82  10/25/20 121/62  08/22/20 114/80    Objective:  Physical Exam Vitals reviewed.  Constitutional:      General: She is not in acute distress.    Appearance: Normal appearance.  Cardiovascular:     Rate and Rhythm: Normal rate and regular rhythm.     Pulses: Normal pulses.     Heart sounds: No murmur heard. Pulmonary:     Effort: Pulmonary effort is normal. No respiratory distress.     Breath sounds: Normal breath sounds. No wheezing.  Neurological:     General: No focal deficit present.     Mental Status: She is alert and oriented to person, place, and time.     Cranial Nerves: No cranial nerve deficit.  Psychiatric:        Mood and Affect: Mood normal.        Behavior: Behavior normal.        Thought Content: Thought content normal.        Judgment: Judgment normal.        Assessment And Plan:     1. Sepsis secondary to UTI [facility]) TCM Performed. A member of the clinical team spoke with the patient upon dischare. Discharge summary was reviewed in full detail during the visit. Meds reconciled and compared to discharge meds. Medication list is updated and reviewed with the patient.  Greater than 50% face to face time was spent in counseling an coordination of care.  All questions were answered to the satisfaction of the patient.   She is doing much better after being treated for sepsis with UTI with e. Coli. She is requiring more assistance at home and would like a bench shower chair and to continue PT/OT Here with her daughter today - CBC - POCT Urinalysis Dipstick (81002)  2. Hypokalemia Potassium was low while hospitalized will check again today. - BMP8+eGFR  3. Hospital discharge follow-up    Patient was given opportunity to ask questions. Patient verbalized understanding of the plan and was able to repeat key elements of the plan. All questions were answered to their satisfaction.  [name], FNP   I , [name], FNP, have  reviewed all documentation for this visit. The documentation on 11/01/20 for the exam, diagnosis, procedures, and orders are all accurate and complete.   IF YOU HAVE BEEN REFERRED TO A SPECIALIST, IT MAY TAKE 1-2 WEEKS TO SCHEDULE/PROCESS THE REFERRAL. IF YOU HAVE NOT HEARD FROM US/SPECIALIST IN TWO WEEKS, PLEASE GIVE [place] A CALL AT [phone].   THE PATIENT IS ENCOURAGED TO PRACTICE SOCIAL DISTANCING DUE TO THE COVID-19 PANDEMIC.

## 2020-11-02 DIAGNOSIS — I5032 Chronic diastolic (congestive) heart failure: Secondary | ICD-10-CM | POA: Diagnosis not present

## 2020-11-02 DIAGNOSIS — I110 Hypertensive heart disease with heart failure: Secondary | ICD-10-CM | POA: Diagnosis not present

## 2020-11-02 DIAGNOSIS — N390 Urinary tract infection, site not specified: Secondary | ICD-10-CM | POA: Diagnosis not present

## 2020-11-02 LAB — BMP8+EGFR
BUN/Creatinine Ratio: 18 (ref 12–28)
BUN: 14 mg/dL (ref 8–27)
CO2: 24 mmol/L (ref 20–29)
Calcium: 9.2 mg/dL (ref 8.7–10.3)
Chloride: 102 mmol/L (ref 96–106)
Creatinine, Ser: 0.77 mg/dL (ref 0.57–1.00)
Glucose: 92 mg/dL (ref 65–99)
Potassium: 4.4 mmol/L (ref 3.5–5.2)
Sodium: 140 mmol/L (ref 134–144)
eGFR: 79 mL/min/{1.73_m2} (ref >59)

## 2020-11-02 LAB — CBC
Hematocrit: 39 % (ref 34.0–46.6)
Hemoglobin: 12.7 g/dL (ref 11.1–15.9)
MCH: 29.5 pg (ref 26.6–33.0)
MCHC: 32.6 g/dL (ref 31.5–35.7)
MCV: 91 fL (ref 79–97)
Platelets: 375 10*3/uL (ref 150–450)
RBC: 4.3 x10E6/uL (ref 3.77–5.28)
RDW: 13.2 % (ref 11.7–15.4)
WBC: 7.1 10*3/uL (ref 3.4–10.8)

## 2020-11-03 LAB — URINE CULTURE

## 2020-11-05 ENCOUNTER — Ambulatory Visit: Payer: Self-pay

## 2020-11-05 ENCOUNTER — Telehealth: Payer: Medicare Other

## 2020-11-05 DIAGNOSIS — E7800 Pure hypercholesterolemia, unspecified: Secondary | ICD-10-CM

## 2020-11-05 DIAGNOSIS — F3341 Major depressive disorder, recurrent, in partial remission: Secondary | ICD-10-CM

## 2020-11-05 DIAGNOSIS — R413 Other amnesia: Secondary | ICD-10-CM

## 2020-11-05 DIAGNOSIS — W19XXXA Unspecified fall, initial encounter: Secondary | ICD-10-CM

## 2020-11-05 DIAGNOSIS — Z8673 Personal history of transient ischemic attack (TIA), and cerebral infarction without residual deficits: Secondary | ICD-10-CM

## 2020-11-05 DIAGNOSIS — I2510 Atherosclerotic heart disease of native coronary artery without angina pectoris: Secondary | ICD-10-CM

## 2020-11-05 DIAGNOSIS — E559 Vitamin D deficiency, unspecified: Secondary | ICD-10-CM

## 2020-11-05 NOTE — Progress Notes (Signed)
This encounter was created in error - please disregard.

## 2020-11-06 DIAGNOSIS — I110 Hypertensive heart disease with heart failure: Secondary | ICD-10-CM | POA: Diagnosis not present

## 2020-11-06 DIAGNOSIS — N390 Urinary tract infection, site not specified: Secondary | ICD-10-CM | POA: Diagnosis not present

## 2020-11-06 DIAGNOSIS — I5032 Chronic diastolic (congestive) heart failure: Secondary | ICD-10-CM | POA: Diagnosis not present

## 2020-11-07 DIAGNOSIS — I2510 Atherosclerotic heart disease of native coronary artery without angina pectoris: Secondary | ICD-10-CM

## 2020-11-07 DIAGNOSIS — E7800 Pure hypercholesterolemia, unspecified: Secondary | ICD-10-CM

## 2020-11-07 DIAGNOSIS — F3341 Major depressive disorder, recurrent, in partial remission: Secondary | ICD-10-CM

## 2020-11-09 DIAGNOSIS — I5032 Chronic diastolic (congestive) heart failure: Secondary | ICD-10-CM | POA: Diagnosis not present

## 2020-11-09 DIAGNOSIS — N390 Urinary tract infection, site not specified: Secondary | ICD-10-CM | POA: Diagnosis not present

## 2020-11-09 DIAGNOSIS — I110 Hypertensive heart disease with heart failure: Secondary | ICD-10-CM | POA: Diagnosis not present

## 2020-11-13 ENCOUNTER — Encounter (INDEPENDENT_AMBULATORY_CARE_PROVIDER_SITE_OTHER): Payer: Self-pay | Admitting: Ophthalmology

## 2020-11-13 ENCOUNTER — Ambulatory Visit (INDEPENDENT_AMBULATORY_CARE_PROVIDER_SITE_OTHER): Payer: Medicare Other | Admitting: Ophthalmology

## 2020-11-13 ENCOUNTER — Encounter (INDEPENDENT_AMBULATORY_CARE_PROVIDER_SITE_OTHER): Payer: Medicare Other | Admitting: Ophthalmology

## 2020-11-13 ENCOUNTER — Other Ambulatory Visit: Payer: Self-pay

## 2020-11-13 DIAGNOSIS — H353132 Nonexudative age-related macular degeneration, bilateral, intermediate dry stage: Secondary | ICD-10-CM | POA: Diagnosis not present

## 2020-11-13 DIAGNOSIS — H2512 Age-related nuclear cataract, left eye: Secondary | ICD-10-CM | POA: Diagnosis not present

## 2020-11-13 DIAGNOSIS — N390 Urinary tract infection, site not specified: Secondary | ICD-10-CM | POA: Diagnosis not present

## 2020-11-13 DIAGNOSIS — I110 Hypertensive heart disease with heart failure: Secondary | ICD-10-CM | POA: Diagnosis not present

## 2020-11-13 DIAGNOSIS — H353211 Exudative age-related macular degeneration, right eye, with active choroidal neovascularization: Secondary | ICD-10-CM | POA: Diagnosis not present

## 2020-11-13 DIAGNOSIS — I5032 Chronic diastolic (congestive) heart failure: Secondary | ICD-10-CM | POA: Diagnosis not present

## 2020-11-13 MED ORDER — BEVACIZUMAB 2.5 MG/0.1ML IZ SOSY
2.5000 mg | PREFILLED_SYRINGE | INTRAVITREAL | Status: AC | PRN
  Administered 2020-11-13: 2.5 mg via INTRAVITREAL

## 2020-11-13 NOTE — Assessment & Plan Note (Signed)
Improved macular findings but still active thickening superior to the fovea OD currently at 6-week follow-up.  Repeat injection today examination next OD in 6 weeks

## 2020-11-13 NOTE — Progress Notes (Signed)
11/13/2020     CHIEF COMPLAINT Patient presents for  Chief Complaint  Patient presents with   Retina Follow Up      HISTORY OF PRESENT ILLNESS: [name] is a 80 y.o. female who presents to the clinic today for:   HPI     Retina Follow Up   Patient presents with  Wet AMD.  In right eye.  This started 6.  Severity is mild.  Duration of 6 weeks.  Since onset it is stable.        Comments   6 week fu OU and Avastin OD Pt states VA OU stable since last visit. Pt denies FOL, floaters, or ocular pain OU.  Pt states, "I just got out the hospital in August 18th from being septic."       Last edited by Demetrios Loll, COA on 11/13/2020  1:54 PM.      Referring physician: Ernesto Rutherford, MD [address] [place],  [place] [address]  HISTORICAL INFORMATION:   Selected notes from the MEDICAL RECORD NUMBER    Lab Results  Component Value Date   HGBA1C 5.5 10/27/2019     CURRENT MEDICATIONS: Current Outpatient Medications (Ophthalmic Drugs)  Medication Sig   Polyethyl Glycol-Propyl Glycol (SYSTANE OP) Apply to eye. As needed   No current facility-administered medications for this visit. (Ophthalmic Drugs)   Current Outpatient Medications (Other)  Medication Sig   acetaminophen (TYLENOL) 500 MG tablet Take 1,000 mg by mouth every 6 (six) hours as needed.   cetirizine (ZYRTEC) 10 MG tablet Take 10 mg by mouth daily.   Cholecalciferol (VITAMIN D) 2000 units tablet Take 2,000 Units by mouth daily.   clopidogrel (PLAVIX) 75 MG tablet TAKE 1 TABLET BY MOUTH EVERY DAY   fluticasone (FLONASE) 50 MCG/ACT nasal spray Place into both nostrils daily as needed for allergies or rhinitis.   pregabalin (LYRICA) 25 MG capsule Take 1 capsule 3 times daily   sertraline (ZOLOFT) 50 MG tablet TAKE 1 TABLET BY MOUTH EVERY DAY   topiramate (TOPAMAX) 25 MG tablet Take one tablet in the morning and 3 in the evening   vitamin C (ASCORBIC ACID) 250 MG tablet Take 250 mg by mouth daily.     No current facility-administered medications for this visit. (Other)      REVIEW OF SYSTEMS:    ALLERGIES Allergies  Allergen Reactions   Aspirin Anaphylaxis and Hives   Elavil [Amitriptyline] Anaphylaxis   Latex    Relafen [Nabumetone] Hives    PAST MEDICAL HISTORY Past Medical History:  Diagnosis Date   Abnormality of gait 04/10/2015   Cervical spondylosis without myelopathy 07/18/2013   Common migraine with intractable migraine 04/10/2015   DJD (degenerative joint disease)    Fibromyalgia    Hiatal hernia    Migraine headache    Vitreomacular adhesion of right eye 03/08/2020   Past Surgical History:  Procedure Laterality Date   ABDOMINAL HYSTERECTOMY     DILATION AND CURETTAGE OF UTERUS     HIATAL HERNIA REPAIR     TONSILLECTOMY      FAMILY HISTORY Family History  Problem Relation Age of Onset   CVA Mother    Stroke Father    Emphysema Father     SOCIAL HISTORY Social History   Tobacco Use   Smoking status: Never   Smokeless tobacco: Never  Vaping Use   Vaping Use: Never used  Substance Use Topics   Alcohol use: No  Comment: very occasional   Drug use: No         OPHTHALMIC EXAM:  Base Eye Exam     Visual Acuity (ETDRS)       Right Left   Dist cc 20/40 20/30 -2   Dist ph cc 20/30 -1 NI         Tonometry (Tonopen, 1:59 PM)       Right Left   Pressure 11 11         Pupils       Pupils Dark Light Shape React APD   Right PERRL 4 3 Round Brisk None   Left PERRL 4 3 Round Brisk None         Visual Fields (Counting fingers)       Left Right    Full Full         Extraocular Movement       Right Left    Full Full         Neuro/Psych     Oriented x3: Yes   Mood/Affect: Normal         Dilation     Both eyes: 1.0% Mydriacyl, 2.5% Phenylephrine @ 1:59 PM           Slit Lamp and Fundus Exam     External Exam       Right Left   External Normal Normal         Slit Lamp Exam       Right  Left   Lids/Lashes Normal Normal   Conjunctiva/Sclera White and quiet White and quiet   Cornea Clear Clear   Anterior Chamber Deep and quiet Deep and quiet   Iris Round and reactive Round and reactive   Lens Centered posterior chamber intraocular lens, Open posterior capsule 2+ Nuclear sclerosis   Anterior Vitreous Normal Normal         Fundus Exam       Right Left   Posterior Vitreous Central vitreous floaters Central vitreous floaters   Disc Normal Normal   C/D Ratio 0.2 0.1   Macula less Macular thickening, Smaller Subretinal neovascular membrane, Retinal pigment epithelial mottling and hyperpigmentation subfoveal, Early age related macular degeneration Early age related macular degeneration, no macular thickening, no exudates   Vessels Normal Normal   Periphery Normal Normal            IMAGING AND PROCEDURES  Imaging and Procedures for 11/13/20  OCT, Retina - OU - Both Eyes       Right Eye Quality was good. Scan locations included subfoveal. Central Foveal Thickness: 280. Progression has worsened. Findings include vitreous traction, no IRF, abnormal foveal contour, pigment epithelial detachment, subretinal fluid.   Left Eye Quality was good. Scan locations included subfoveal. Central Foveal Thickness: 272. Progression has been stable. Findings include retinal drusen .   Notes Small residual cyst serous subretinal detachment temporal to the fovea much improved though compared to August, OD vastly improved overall.   Apparent posterior vitreous detachment now  OS with incidental posterior vitreous detachment     Intravitreal Injection, Pharmacologic Agent - OD - Right Eye       Time Out 11/13/2020. 2:43 PM. Confirmed correct patient, procedure, site, and patient consented.   Anesthesia Topical anesthesia was used. Anesthetic medications included Akten 3.5%.   Procedure Preparation included Ofloxacin , Tobramycin 0.3%, 10% betadine to eyelids, 5% betadine to  ocular surface. A 30 gauge needle was used.   Injection: 2.5 mg bevacizumab 2.5  MG/0.1ML   Route: Intravitreal, Site: Right Eye   NDC: [phone], Lot: [id number]   Post-op Post injection exam found visual acuity of at least counting fingers. The patient tolerated the procedure well. There were no complications. The patient received written and verbal post procedure care education. Post injection medications were not given.              ASSESSMENT/PLAN:  Exudative age-related macular degeneration of right eye with active choroidal neovascularization (HCC) Improved macular findings but still active thickening superior to the fovea OD currently at 6-week follow-up.  Repeat injection today examination next OD in 6 weeks  Intermediate stage nonexudative age-related macular degeneration of both eyes No sign of CNVM OS today  Nuclear sclerotic cataract of left eye Progressive NSC follow-up [facility] as scheduled     ICD-10-CM   1. Exudative age-related macular degeneration of right eye with active choroidal neovascularization (HCC)  H35.3211 OCT, Retina - OU - Both Eyes    Intravitreal Injection, Pharmacologic Agent - OD - Right Eye    bevacizumab (AVASTIN) SOSY 2.5 mg    2. Intermediate stage nonexudative age-related macular degeneration of both eyes  H35.3132     3. Nuclear sclerotic cataract of left eye  H25.12       1.  No interval change despite having systemic illness.  Acuity remains stable OD.  Macular findings remained stable after injection Avastin 6 weeks prior for wet AMD.  We will repeat injection today to maintain current interval examination  2.  3.  Ophthalmic Meds Ordered this visit:  Meds ordered this encounter  Medications   bevacizumab (AVASTIN) SOSY 2.5 mg       Return in about 6 weeks (around 12/25/2020) for dilate, OD, AVASTIN OCT.  There are no Patient Instructions on file for this visit.   Explained the diagnoses, plan, and follow up  with the patient and they expressed understanding.  Patient expressed understanding of the importance of proper follow up care.   Alford Highland Cathleen Yagi M.D. Diseases & Surgery of the Retina and Vitreous [facility] 11/13/20     Abbreviations: M myopia (nearsighted); A astigmatism; H hyperopia (farsighted); P presbyopia; Mrx spectacle prescription;  CTL contact lenses; OD right eye; OS left eye; OU both eyes  XT exotropia; ET esotropia; PEK punctate epithelial keratitis; PEE punctate epithelial erosions; DES dry eye syndrome; MGD meibomian gland dysfunction; ATs artificial tears; PFAT's preservative free artificial tears; NSC nuclear sclerotic cataract; PSC posterior subcapsular cataract; ERM epi-retinal membrane; PVD posterior vitreous detachment; RD retinal detachment; DM diabetes mellitus; DR diabetic retinopathy; NPDR non-proliferative diabetic retinopathy; PDR proliferative diabetic retinopathy; CSME clinically significant macular edema; DME diabetic macular edema; dbh dot blot hemorrhages; CWS cotton wool spot; POAG primary open angle glaucoma; C/D cup-to-disc ratio; HVF humphrey visual field; GVF goldmann visual field; OCT optical coherence tomography; IOP intraocular pressure; BRVO Branch retinal vein occlusion; CRVO central retinal vein occlusion; CRAO central retinal artery occlusion; BRAO branch retinal artery occlusion; RT retinal tear; SB scleral buckle; PPV pars plana vitrectomy; VH Vitreous hemorrhage; PRP panretinal laser photocoagulation; IVK intravitreal kenalog; VMT vitreomacular traction; MH Macular hole;  NVD neovascularization of the disc; NVE neovascularization elsewhere; AREDS age related eye disease study; ARMD age related macular degeneration; POAG primary open angle glaucoma; EBMD epithelial/anterior basement membrane dystrophy; ACIOL anterior chamber intraocular lens; IOL intraocular lens; PCIOL posterior chamber intraocular lens; Phaco/IOL phacoemulsification with  intraocular lens placement; PRK photorefractive keratectomy; LASIK laser assisted in situ keratomileusis; HTN  hypertension; DM diabetes mellitus; COPD chronic obstructive pulmonary disease

## 2020-11-13 NOTE — Assessment & Plan Note (Signed)
No sign of CNVM OS today 

## 2020-11-13 NOTE — Assessment & Plan Note (Signed)
Progressive NSC follow-up [facility] as scheduled

## 2020-11-14 ENCOUNTER — Other Ambulatory Visit: Payer: Self-pay | Admitting: Neurology

## 2020-11-14 MED ORDER — PREGABALIN 25 MG PO CAPS: ORAL_CAPSULE | ORAL | 1 refills | Status: DC

## 2020-11-14 NOTE — Telephone Encounter (Signed)
LVM for patient to return call.  Was giving 90 day with 1 refill, has only used one fill of medication.  Last refilled on 05/16/2020.     Will ask if patient is still using Lyrica before refilling.  [place] [facility] Checked Last fill 05/16/2020, #[id number] for 90 days Next Visit scheduled 05/20/2021

## 2020-11-14 NOTE — Addendum Note (Signed)
Addended by: Alexia Freestone R on: 11/14/2020 11:36 AM   Modules accepted: Orders

## 2020-11-15 NOTE — Chronic Care Management (AMB) (Signed)
Chronic Care Management   CCM RN Visit Note  11/05/2020 Name: [name] MRN: [id number] DOB: [birth year]  Subjective: [name] is a 80 y.o. year old female who is a primary care patient of Arnette Felts, FNP. The care management team was consulted for assistance with disease management and care coordination needs.    Engaged with patient by telephone for follow up visit in response to provider referral for case management and/or care coordination services.   Consent to Services:  The patient was given information about Chronic Care Management services, agreed to services, and gave verbal consent prior to initiation of services.  Please see initial visit note for detailed documentation.   Patient agreed to services and verbal consent obtained.   Assessment: Review of patient past medical history, allergies, medications, health status, including review of consultants reports, laboratory and other test data, was performed as part of comprehensive evaluation and provision of chronic care management services.   SDOH (Social Determinants of Health) assessments and interventions performed:  Yes, no acute challenges   CCM Care Plan  Allergies  Allergen Reactions   Aspirin Anaphylaxis and Hives   Elavil [Amitriptyline] Anaphylaxis   Latex    Relafen [Nabumetone] Hives    Outpatient Encounter Medications as of 11/05/2020  Medication Sig   acetaminophen (TYLENOL) 500 MG tablet Take 1,000 mg by mouth every 6 (six) hours as needed.   cetirizine (ZYRTEC) 10 MG tablet Take 10 mg by mouth daily.   Cholecalciferol (VITAMIN D) 2000 units tablet Take 2,000 Units by mouth daily.   clopidogrel (PLAVIX) 75 MG tablet TAKE 1 TABLET BY MOUTH EVERY DAY   fluticasone (FLONASE) 50 MCG/ACT nasal spray Place into both nostrils daily as needed for allergies or rhinitis.   Polyethyl Glycol-Propyl Glycol (SYSTANE OP) Apply to eye. As needed   sertraline (ZOLOFT) 50 MG tablet TAKE 1 TABLET BY MOUTH  EVERY DAY   topiramate (TOPAMAX) 25 MG tablet Take one tablet in the morning and 3 in the evening   vitamin C (ASCORBIC ACID) 250 MG tablet Take 250 mg by mouth daily.    [DISCONTINUED] pregabalin (LYRICA) 25 MG capsule Take 1 capsule 3 times daily   No facility-administered encounter medications on file as of 11/05/2020.    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 10/26/2019   Intermediate stage nonexudative age-related macular degeneration of both eyes 10/26/2019   Vitamin D deficiency 06/28/2019   Abnormal glucose 10/21/2018   History of TIA (transient ischemic attack) 06/17/2018   Recurrent major depressive disorder, in partial remission (HCC) 06/17/2018   Seasonal allergies 06/17/2018   Depression 02/11/2018   Hiatal hernia 02/11/2018   Dysphagia 02/11/2018   Hyperkalemia 02/11/2018   Abnormality of gait 04/10/2015   Cervical spondylosis without myelopathy 07/18/2013   Left-sided weakness 05/29/2013   Left sided numbness 05/29/2013   Headache 05/29/2013   TIA (transient ischemic attack) 05/29/2013   Neck pain on left side 05/29/2013   Fibromyalgia     Conditions to be addressed/monitored: Recurrent major depression, Atherosclerotic cardiovascular disease, Vitamin D deficiency, Memory changes, History of TIA, Fall, initial encounter, Elevated  cholesterol     Care Plan : Fall Risk (Adult)  Updates made by Riley Churches, RN since 11/05/2020 12:00 AM     Problem: Fall Risk   Priority: High     Long-Range Goal: Absence of Fall and Fall-Related Injury    Start Date: 07/09/2020  Expected End Date: 01/09/2021  Recent Progress: On track  Priority: High  Note:   Current Barriers:  Knowledge Deficits related to fall precautions in patient with Recurrent major depression, Atherosclerotic cardiovascular disease, Vitamin D deficiency, Memory changes, History of TIA, Fall, initial encounter, Elevated cholesterol   Decreased adherence to prescribed treatment for fall prevention Lacks social connections Clinical Goal(s):  patient will demonstrate improved adherence to prescribed treatment plan for decreasing falls as evidenced by patient reporting and review of EMR patient will verbalize using fall risk reduction strategies discussed patient will not experience additional falls Interventions:  11/05/20 completed outbound successful call with patient  Collaboration with Arnette Felts, FNP regarding development and update of comprehensive plan of care as evidenced by provider attestation and co-signature Inter-disciplinary care team collaboration (see longitudinal plan of care) Evaluation of current treatment plan related to Abnormal gait/falls and patient's adherence to plan as established by provider Determined patient has not experienced falls since our last contact, she remains to be ambulatory without DME Educated patient on the importance of taking her time when changing positions and to avoid bending forward when possible  Educated on the importance of balancing her activity with rest and to report any/all falls promptly  Reinforced fall prevention tips, instructed patient to notify PCP of any/all falls  Discussed plans with patient for ongoing care management follow up and provided patient with direct contact information for care management team Patient Self-Care Activities:  Continue to keep all scheduled follow up appointments Take medications as directed  Let your healthcare team know if you are unable to take your medications Call your  pharmacy for refills at least 7 days prior to running out of medication Patient Goals:  - always wear low-heeled or flat shoes or slippers with nonskid soles - keep my cell phone with me always - learn how to get back up if I fall - make an emergency alert plan in case I fall - pick up clutter from the floors - use a nonslip pad with throw rugs, or remove them completely - use a cane or walker - use a nightlight in the bathroom  Follow Up Plan: Telephone follow up appointment with care management team member scheduled for: 02/05/21    Care Plan : Depression (Adult)  Updates made by Riley Churches, RN since 11/05/2020 12:00 AM     Problem: Symptoms (Depression)   Priority: High     Long-Range Goal: Symptoms Monitored and Managed   Start Date: 07/09/2020  Expected End Date: 07/09/2021  Recent Progress: On track  Priority: High  Note:   Current Barriers:  Ineffective Self Health Maintenance  Lacks social connections Clinical Goal(s):  Collaboration with Arnette Felts, FNP regarding development and update of comprehensive plan of care as evidenced by provider attestation and co-signature Inter-disciplinary care team collaboration (see longitudinal plan of care) patient will work with care management team to address care coordination and chronic disease management needs related to Disease Management Educational Needs Care Coordination Medication Management and Education Psychosocial Support   Interventions:  11/05/20 completed successful outbound call with patient  Evaluation of current treatment plan related to Depression, self-management and patient's adherence to plan as established  by provider. Collaboration with Arnette FeltsMoore, Janece, FNP regarding development and update of comprehensive plan of care as evidenced by provider attestation       and co-signature Inter-disciplinary care team collaboration (see longitudinal plan of care) Provided education to patient about basic disease  process related to Depression Review of patient status, including review of consultants reports, relevant laboratory and other test results, and medications completed. Reviewed medications with patient and discussed importance of medication adherence Determined patient feels her depression has improved, she is using prayer and medication to help her cope Determined patient has the support of her son and daughter in law who call and check on her and are available to assist as needed  Encouraged patient to keep her PCP aware of changes with her depression including new or worsening symptoms Discussed plans with patient for ongoing care management follow up and provided patient with direct contact information for care management team Self Care Activities:  - avoid negative self-talk - develop a plan to deal with triggers like holidays, anniversaries - exercise at least 2 to 3 times per week - have a plan for how to handle bad days - journal feelings and what helps to feel better or worse - spend time or talk with others at least 2 to 3 times per week - watch for early signs of feeling worse Patient Goals: - avoid negative self-talk - develop a plan to deal with triggers like holidays, anniversaries - exercise at least 2 to 3 times per week - have a plan for how to handle bad days - journal feelings and what helps to feel better or worse - spend time or talk with others at least 2 to 3 times per week - watch for early signs of feeling worse   Follow Up Plan: Telephone follow up appointment with care management team member scheduled for: 02/05/21     Care Plan : Vitamin D deficiency  Updates made by Riley ChurchesLittle, Jaicey Sweaney L, RN since 11/05/2020 12:00 AM     Problem: Vitamin D deficiency   Priority: Medium     Long-Range Goal: Vitamin D deficiency - treatment optimized   Start Date: 07/09/2020  Expected End Date: 07/09/2021  Recent Progress: On track  Priority: Medium  Note:   Current Barriers:   Ineffective Self Health Maintenance  Clinical Goal(s):  Collaboration with Arnette FeltsMoore, Janece, FNP regarding development and update of comprehensive plan of care as evidenced by provider attestation and co-signature Inter-disciplinary care team collaboration (see longitudinal plan of care) patient will work with care management team to address care coordination and chronic disease management needs related to Disease Management Educational Needs Care Coordination Medication Management and Education Medication Reconciliation Psychosocial Support   Interventions:  11/05/20 completed outbound call with patient  Evaluation of current treatment plan related to  Vitamin D deficiency , self-management and patient's adherence to plan as established by provider. Collaboration with Arnette FeltsMoore, Janece, FNP regarding development and update of comprehensive plan of care as evidenced by provider attestation       and co-signature Inter-disciplinary care team collaboration (see longitudinal plan of care) Provided education to patient about basic disease process related to Vitamin D deficiency Review of patient status, including review of consultants reports, relevant laboratory and other test results, and medications completed. Reviewed medications with patient and discussed importance of medication adherence Educated patient on dietary recommendations and encouraged patient to get at least 15 minutes of natural sunlight daily when possible Discussed plans with patient for ongoing care management  follow up and provided patient with direct contact information for care management team Self Care Activities:  Continue to keep all scheduled follow up appointments Take medications as directed  Let your healthcare team know if you are unable to take your medications Call your pharmacy for refills at least 7 days prior to running out of medication Patient Goals: - Eat Vitamin D rich foods - get at least 15 minutes of  natural sunlight daily when possible - take Vitamin D supplement as directed   Follow Up Plan: Telephone follow up appointment with care management team member scheduled for: 02/05/21     Care Plan : Elevated Cholesterol  Updates made by Riley Churches, RN since 11/05/2020 12:00 AM     Problem: Elevated Cholesterol   Priority: High     Long-Range Goal: Elevated Cholesterol - treatment optimized   Start Date: 08/23/2020  Expected End Date: 08/23/2021  Recent Progress: On track  Priority: High  Note:   Current Barriers:  Ineffective Self Health Maintenance  Clinical Goal(s):  Collaboration with Arnette Felts, FNP regarding development and update of comprehensive plan of care as evidenced by provider attestation and co-signature Inter-disciplinary care team collaboration (see longitudinal plan of care) patient will work with care management team to address care coordination and chronic disease management needs related to Disease Management Educational Needs Care Coordination Medication Management and Education Psychosocial Support   Interventions:  11/05/20 completed successful outbound call with patient  Evaluation of current treatment plan related to  Elevated Cholesterol  , self-management and patient's adherence to plan as established by provider. Collaboration with Arnette Felts, FNP regarding development and update of comprehensive plan of care as evidenced by provider attestation       and co-signature Inter-disciplinary care team collaboration (see longitudinal plan of care) Provided education to patient about basic disease process related to elevated Cholesterol  Review of patient status, including review of consultant's reports, relevant laboratory and other test results, and medications completed. Reviewed medications with patient and discussed importance of medication adherence Educated patient on dietary and exercise recommendations Mailed printed educational materials  related to Cooking Right with Cholesterol  Discussed plans with patient for ongoing care management follow up and provided patient with direct contact information for care management team Self Care Activities:  Self administers medications as prescribed Attends all scheduled provider appointments Calls pharmacy for medication refills Calls provider office for new concerns or questions Adhere to dietary and exercise recommendations  Patient Goals: - adhere to dietary and exercise recommendations to lower Cholesterol  Follow Up Plan: Telephone follow up appointment with care management team member scheduled for: 02/05/21     Care Plan : Chronic Kidney (Adult)  Updates made by Riley Churches, RN since 11/05/2020 12:00 AM     Problem: Disease Progression   Priority: Medium     Long-Range Goal: Disease Progression Prevented or Minimized   Start Date: 08/23/2020  Expected End Date: 08/23/2021  Recent Progress: On track  Priority: Medium  Note:   Current Barriers:  Ineffective Self Health Maintenance  Clinical Goal(s):  Collaboration with Arnette Felts, FNP regarding development and update of comprehensive plan of care as evidenced by provider attestation and co-signature Inter-disciplinary care team collaboration (see longitudinal plan of care) patient will work with care management team to address care coordination and chronic disease management needs related to Disease Management Educational Needs Care Coordination Medication Management and Education Psychosocial Support   Interventions: 11/05/20 completed successful outbound call with patient  Evaluation of current treatment plan related to CKD Stage III , self-management and patient's adherence to plan as established by provider. Collaboration with Arnette Felts, FNP regarding development and update of comprehensive plan of care as evidenced by provider attestation       and co-signature Inter-disciplinary care team  collaboration (see longitudinal plan of care) Provided education to patient about basic disease process related to stage III Chronic Kidney disease  Review of patient status, including review of consultant's reports, relevant laboratory and other test results, and medications completed. Reviewed medications with patient and discussed importance of medication adherence Educated on the importance of increasing water intake to 64 oz daily; Educated on the potential SE for kidney stones secondary to taking Topamax Mailed printed educational materials related to Eating Right with Chronic Kidney disease   Discussed plans with patient for ongoing care management follow up and provided patient with direct contact information for care management team Self-Care Activities:  Continue to keep all scheduled follow up appointments Take medications as directed  Let your healthcare team know if you are unable to take your medications Call your pharmacy for refills at least 7 days prior to running out of medication Patient Goals: - increase water to 64 oz daily unless otherwise directed - adhere to dietary recommendations   Follow Up Plan: Telephone follow up appointment with care management team member scheduled for: 02/05/21     Plan:Telephone follow up appointment with care management team member scheduled for:  02/05/21  Delsa Sale, RN, BSN, CCM Care Management Coordinator [facility] Care Management/[facility]  Direct Phone: [phone]

## 2020-11-15 NOTE — Patient Instructions (Signed)
Goals Addressed      Chronic Kidney disease - progression prevented or minimized   On track    Timeframe:  Long-Range Goal Priority:  Medium Start Date:  08/23/20                           Expected End Date: 08/23/21  Next Scheduled Follow up: 02/05/21         Patient Goals: - increase water to 64 oz daily unless otherwise directed - adhere to dietary recommendations               Elevated Cholesterol - treatment optimized   On track    Timeframe:  Long-Range Goal Priority:  High Start Date: 08/23/20                            Expected End Date: 08/23/21  Next Scheduled Follow up: 02/05/21          Patient Goals: - adhere to dietary and exercise recommendations to lower Cholesterol             Prevent Falls and Injury   On track    Timeframe:  Short-Term Goal Priority:  High Start Date: 07/09/20                            Expected End Date: 01/09/21                    Follow Up Date: 02/05/21    - always wear low-heeled or flat shoes or slippers with nonskid soles - keep my cell phone with me always - learn how to get back up if I fall - make an emergency alert plan in case I fall - pick up clutter from the floors - use a nonslip pad with throw rugs, or remove them completely - use a cane or walker - use a nightlight in the bathroom    Why is this important?   Most falls happen when it is hard for you to walk safely. Your balance may be off because of an illness. You may have pain in your knees, hip or other joints.  You may be overly tired or taking medicines that make you sleepy. You may not be able to see or hear clearly.  Falls can lead to broken bones, bruises or other injuries.  There are things you can do to help prevent falling.     Notes:      Track and Manage My Symptoms-Depression   On track    Timeframe:  Long-Range Goal Priority:  High Start Date: 07/09/20                            Expected End Date: 07/10/21                      Follow Up Date: 02/05/21     - avoid negative self-talk - develop a plan to deal with triggers like holidays, anniversaries - exercise at least 2 to 3 times per week - have a plan for how to handle bad days - journal feelings and what helps to feel better or worse - spend time or talk with others at least 2 to 3 times per week - watch for early signs of feeling worse  Why is this important?   Keeping track of your progress will help your treatment team find the right mix of medicine and therapy for you.  Write in your journal every day.  Day-to-day changes in depression symptoms are normal. It may be more helpful to check your progress at the end of each week instead of every day.     Notes:      Vitamin D deficiency - treatment optimized   On track    Timeframe:  Long-Range Goal Priority:  Medium Start Date:  07/09/20                           Expected End Date: 07/09/21  Next Scheduled Follow up date: 02/05/21    - Eat Vitamin D rich foods - get at least 15 minutes of natural sunlight daily when possible - take Vitamin D supplement as directed

## 2020-11-16 DIAGNOSIS — N390 Urinary tract infection, site not specified: Secondary | ICD-10-CM | POA: Diagnosis not present

## 2020-11-16 DIAGNOSIS — I110 Hypertensive heart disease with heart failure: Secondary | ICD-10-CM | POA: Diagnosis not present

## 2020-11-16 DIAGNOSIS — I5032 Chronic diastolic (congestive) heart failure: Secondary | ICD-10-CM | POA: Diagnosis not present

## 2020-11-19 ENCOUNTER — Other Ambulatory Visit: Payer: Self-pay | Admitting: Nurse Practitioner

## 2020-11-19 DIAGNOSIS — F3341 Major depressive disorder, recurrent, in partial remission: Secondary | ICD-10-CM

## 2020-11-20 DIAGNOSIS — I110 Hypertensive heart disease with heart failure: Secondary | ICD-10-CM | POA: Diagnosis not present

## 2020-11-20 DIAGNOSIS — N390 Urinary tract infection, site not specified: Secondary | ICD-10-CM | POA: Diagnosis not present

## 2020-11-20 DIAGNOSIS — I5032 Chronic diastolic (congestive) heart failure: Secondary | ICD-10-CM | POA: Diagnosis not present

## 2020-11-25 DIAGNOSIS — A419 Sepsis, unspecified organism: Secondary | ICD-10-CM | POA: Diagnosis not present

## 2020-11-25 DIAGNOSIS — G9341 Metabolic encephalopathy: Secondary | ICD-10-CM | POA: Diagnosis not present

## 2020-11-25 DIAGNOSIS — E876 Hypokalemia: Secondary | ICD-10-CM | POA: Diagnosis not present

## 2020-12-25 ENCOUNTER — Encounter (INDEPENDENT_AMBULATORY_CARE_PROVIDER_SITE_OTHER): Payer: Self-pay | Admitting: Ophthalmology

## 2020-12-25 ENCOUNTER — Other Ambulatory Visit: Payer: Self-pay

## 2020-12-25 ENCOUNTER — Ambulatory Visit (INDEPENDENT_AMBULATORY_CARE_PROVIDER_SITE_OTHER): Payer: Medicare Other | Admitting: Ophthalmology

## 2020-12-25 ENCOUNTER — Encounter (INDEPENDENT_AMBULATORY_CARE_PROVIDER_SITE_OTHER): Payer: Medicare Other | Admitting: Ophthalmology

## 2020-12-25 DIAGNOSIS — H353132 Nonexudative age-related macular degeneration, bilateral, intermediate dry stage: Secondary | ICD-10-CM | POA: Diagnosis not present

## 2020-12-25 DIAGNOSIS — G9341 Metabolic encephalopathy: Secondary | ICD-10-CM | POA: Diagnosis not present

## 2020-12-25 DIAGNOSIS — E876 Hypokalemia: Secondary | ICD-10-CM | POA: Diagnosis not present

## 2020-12-25 DIAGNOSIS — A419 Sepsis, unspecified organism: Secondary | ICD-10-CM | POA: Diagnosis not present

## 2020-12-25 DIAGNOSIS — H353211 Exudative age-related macular degeneration, right eye, with active choroidal neovascularization: Secondary | ICD-10-CM

## 2020-12-25 MED ORDER — BEVACIZUMAB 2.5 MG/0.1ML IZ SOSY
2.5000 mg | PREFILLED_SYRINGE | INTRAVITREAL | Status: AC | PRN
  Administered 2020-12-25: 2.5 mg via INTRAVITREAL

## 2020-12-25 NOTE — Assessment & Plan Note (Signed)
OD, improved macular findings, vascularized PED, and yet no residual subretinal fluid.  We will repeat injection today Avastin and follow-up again in 6 weeks right eye

## 2020-12-25 NOTE — Assessment & Plan Note (Signed)
No signs of CNVM OS 

## 2020-12-25 NOTE — Progress Notes (Signed)
12/25/2020     CHIEF COMPLAINT Patient presents for  Chief Complaint  Patient presents with   Retina Follow Up      HISTORY OF PRESENT ILLNESS: [name] is a 80 y.o. female who presents to the clinic today for:   HPI     Retina Follow Up   Patient presents with  Wet AMD.  In right eye.  This started 6.  Severity is mild.  Duration of 6 weeks.  Since onset it is stable.        Comments   6 week fu OD oct avastin OD. Patient states vision is stable and unchanged since last visit. Denies any new floaters or FOL.       Last edited by Nelva Nay on 12/25/2020  1:49 PM.      Referring physician: Arnette Felts, FNP [address] [place],  [place] [address]  HISTORICAL INFORMATION:   Selected notes from the MEDICAL RECORD NUMBER    Lab Results  Component Value Date   HGBA1C 5.5 10/27/2019     CURRENT MEDICATIONS: Current Outpatient Medications (Ophthalmic Drugs)  Medication Sig   Polyethyl Glycol-Propyl Glycol (SYSTANE OP) Apply to eye. As needed   No current facility-administered medications for this visit. (Ophthalmic Drugs)   Current Outpatient Medications (Other)  Medication Sig   acetaminophen (TYLENOL) 500 MG tablet Take 1,000 mg by mouth every 6 (six) hours as needed.   cetirizine (ZYRTEC) 10 MG tablet Take 10 mg by mouth daily.   Cholecalciferol (VITAMIN D) 2000 units tablet Take 2,000 Units by mouth daily.   clopidogrel (PLAVIX) 75 MG tablet TAKE 1 TABLET BY MOUTH EVERY DAY   fluticasone (FLONASE) 50 MCG/ACT nasal spray Place into both nostrils daily as needed for allergies or rhinitis.   pregabalin (LYRICA) 25 MG capsule Take 1 capsule 3 times daily   sertraline (ZOLOFT) 50 MG tablet TAKE 1 TABLET BY MOUTH EVERY DAY   topiramate (TOPAMAX) 25 MG tablet Take one tablet in the morning and 3 in the evening   vitamin C (ASCORBIC ACID) 250 MG tablet Take 250 mg by mouth daily.    No current facility-administered medications for  this visit. (Other)      REVIEW OF SYSTEMS: ROS   Negative for: Constitutional, Gastrointestinal, Neurological, Skin, Genitourinary, Musculoskeletal, HENT, Endocrine, Cardiovascular, Eyes, Respiratory, Psychiatric, Allergic/Imm, Heme/Lymph Last edited by Edmon Crape, MD on 12/25/2020  2:30 PM.       ALLERGIES Allergies  Allergen Reactions   Aspirin Anaphylaxis and Hives   Elavil [Amitriptyline] Anaphylaxis   Latex    Relafen [Nabumetone] Hives    PAST MEDICAL HISTORY Past Medical History:  Diagnosis Date   Abnormality of gait 04/10/2015   Cervical spondylosis without myelopathy 07/18/2013   Common migraine with intractable migraine 04/10/2015   DJD (degenerative joint disease)    Fibromyalgia    Hiatal hernia    Migraine headache    Vitreomacular adhesion of right eye 03/08/2020   Past Surgical History:  Procedure Laterality Date   ABDOMINAL HYSTERECTOMY     DILATION AND CURETTAGE OF UTERUS     HIATAL HERNIA REPAIR     TONSILLECTOMY      FAMILY HISTORY Family History  Problem Relation Age of Onset   CVA Mother    Stroke Father    Emphysema Father     SOCIAL HISTORY Social History   Tobacco Use   Smoking status: Never   Smokeless tobacco: Never  Vaping Use  Vaping Use: Never used  Substance Use Topics   Alcohol use: No    Comment: very occasional   Drug use: No         OPHTHALMIC EXAM:  Base Eye Exam     Visual Acuity (ETDRS)       Right Left   Dist cc 20/40 20/25 -1   Dist ph cc NI     Correction: Glasses         Tonometry (Tonopen, 1:53 PM)       Right Left   Pressure 10 10         Pupils       Pupils Dark Light Shape React APD   Right PERRL 4 3 Round Brisk None   Left PERRL 4 3 Round Brisk None         Extraocular Movement       Right Left    Full Full         Neuro/Psych     Oriented x3: Yes   Mood/Affect: Normal         Dilation     Right eye: 1.0% Mydriacyl, 2.5% Phenylephrine @ 1:53 PM            Slit Lamp and Fundus Exam     External Exam       Right Left   External Normal Normal         Slit Lamp Exam       Right Left   Lids/Lashes Normal Normal   Conjunctiva/Sclera White and quiet White and quiet   Cornea Clear Clear   Anterior Chamber Deep and quiet Deep and quiet   Iris Round and reactive Round and reactive   Lens Centered posterior chamber intraocular lens, Open posterior capsule 2+ Nuclear sclerosis   Anterior Vitreous Normal Normal         Fundus Exam       Right Left   Posterior Vitreous Central vitreous floaters    Disc Normal    C/D Ratio 0.2 0.1   Macula less Macular thickening, Smaller Subretinal neovascular membrane, Retinal pigment epithelial mottling and hyperpigmentation subfoveal, Early age related macular degeneration    Vessels Normal    Periphery Normal             IMAGING AND PROCEDURES  Imaging and Procedures for 12/25/20  Intravitreal Injection, Pharmacologic Agent - OD - Right Eye       Time Out 12/25/2020. 2:32 PM. Confirmed correct patient, procedure, site, and patient consented.   Anesthesia Topical anesthesia was used. Anesthetic medications included Akten 3.5%.   Procedure Preparation included Ofloxacin , Tobramycin 0.3%, 10% betadine to eyelids, 5% betadine to ocular surface. A 30 gauge needle was used.   Injection: 2.5 mg bevacizumab 2.5 MG/0.1ML   Route: Intravitreal, Site: Right Eye   NDC: [phone], Lot: [id number]   Post-op Post injection exam found visual acuity of at least counting fingers. The patient tolerated the procedure well. There were no complications. The patient received written and verbal post procedure care education. Post injection medications included ocuflox.      OCT, Retina - OU - Both Eyes       Right Eye Quality was good. Scan locations included subfoveal. Central Foveal Thickness: 275. Progression has worsened. Findings include vitreous traction, no IRF, abnormal foveal  contour, pigment epithelial detachment.   Left Eye Quality was good. Scan locations included subfoveal. Central Foveal Thickness: 270. Progression has been stable. Findings include retinal drusen .  Notes OD with vascularized PED with much less subretinal fluid today at follow-up interval of 6 weeks.  Apparent posterior vitreous detachment now  OS with incidental posterior vitreous detachment             ASSESSMENT/PLAN:  Exudative age-related macular degeneration of right eye with active choroidal neovascularization (HCC) OD, improved macular findings, vascularized PED, and yet no residual subretinal fluid.  We will repeat injection today Avastin and follow-up again in 6 weeks right eye  Intermediate stage nonexudative age-related macular degeneration of both eyes No signs of CNVM OS     ICD-10-CM   1. Exudative age-related macular degeneration of right eye with active choroidal neovascularization (HCC)  H35.3211 Intravitreal Injection, Pharmacologic Agent - OD - Right Eye    OCT, Retina - OU - Both Eyes    bevacizumab (AVASTIN) SOSY 2.5 mg    2. Intermediate stage nonexudative age-related macular degeneration of both eyes  H35.3132       1.  OD, improved macular anatomy and stable acuity on 6-week follow-up interval post Avastin.  We will repeat injection today and examination OD again in 8 weeks  2.  Dilate OU next  3.  Ophthalmic Meds Ordered this visit:  Meds ordered this encounter  Medications   bevacizumab (AVASTIN) SOSY 2.5 mg       Return in about 8 weeks (around 02/19/2021) for DILATE OU, AVASTIN OCT, OD.  There are no Patient Instructions on file for this visit.   Explained the diagnoses, plan, and follow up with the patient and they expressed understanding.  Patient expressed understanding of the importance of proper follow up care.   Alford Highland Kameah Rawl M.D. Diseases & Surgery of the Retina and [facility] 12/25/20     Abbreviations: M myopia (nearsighted); A astigmatism; H hyperopia (farsighted); P presbyopia; Mrx spectacle prescription;  CTL contact lenses; OD right eye; OS left eye; OU both eyes  XT exotropia; ET esotropia; PEK punctate epithelial keratitis; PEE punctate epithelial erosions; DES dry eye syndrome; MGD meibomian gland dysfunction; ATs artificial tears; PFAT's preservative free artificial tears; NSC nuclear sclerotic cataract; PSC posterior subcapsular cataract; ERM epi-retinal membrane; PVD posterior vitreous detachment; RD retinal detachment; DM diabetes mellitus; DR diabetic retinopathy; NPDR non-proliferative diabetic retinopathy; PDR proliferative diabetic retinopathy; CSME clinically significant macular edema; DME diabetic macular edema; dbh dot blot hemorrhages; CWS cotton wool spot; POAG primary open angle glaucoma; C/D cup-to-disc ratio; HVF humphrey visual field; GVF goldmann visual field; OCT optical coherence tomography; IOP intraocular pressure; BRVO Branch retinal vein occlusion; CRVO central retinal vein occlusion; CRAO central retinal artery occlusion; BRAO branch retinal artery occlusion; RT retinal tear; SB scleral buckle; PPV pars plana vitrectomy; VH Vitreous hemorrhage; PRP panretinal laser photocoagulation; IVK intravitreal kenalog; VMT vitreomacular traction; MH Macular hole;  NVD neovascularization of the disc; NVE neovascularization elsewhere; AREDS age related eye disease study; ARMD age related macular degeneration; POAG primary open angle glaucoma; EBMD epithelial/anterior basement membrane dystrophy; ACIOL anterior chamber intraocular lens; IOL intraocular lens; PCIOL posterior chamber intraocular lens; Phaco/IOL phacoemulsification with intraocular lens placement; PRK photorefractive keratectomy; LASIK laser assisted in situ keratomileusis; HTN hypertension; DM diabetes mellitus; COPD chronic obstructive pulmonary disease

## 2021-01-17 ENCOUNTER — Telehealth: Payer: Self-pay

## 2021-01-17 ENCOUNTER — Telehealth: Payer: Medicare Other

## 2021-01-17 NOTE — Telephone Encounter (Signed)
  Care Management   Follow Up Note   01/17/2021 Name: [name] MRN: [id number] DOB: [birth year]   Referred by: Arnette Felts, FNP Reason for referral : Chronic Care Management (Unsuccessful call)   An unsuccessful telephone outreach was attempted today. The patient was referred to the case management team for assistance with care management and care coordination.   Follow Up Plan: A HIPPA compliant phone message was left for the patient providing contact information and requesting a return call. Performed chart review to note no active SW care plans open at this time. Patient is closely followed by [facility]. Collaboration with RN Care Manager to advise SW does not plan to follow up but is available to assist as needed.  Bevelyn Ngo, BSW, CDP Social Worker, Certified Dementia Practitioner [facility] Care Management [phone]

## 2021-01-23 DIAGNOSIS — M9905 Segmental and somatic dysfunction of pelvic region: Secondary | ICD-10-CM | POA: Diagnosis not present

## 2021-01-23 DIAGNOSIS — M9904 Segmental and somatic dysfunction of sacral region: Secondary | ICD-10-CM | POA: Diagnosis not present

## 2021-01-23 DIAGNOSIS — M9903 Segmental and somatic dysfunction of lumbar region: Secondary | ICD-10-CM | POA: Diagnosis not present

## 2021-01-23 DIAGNOSIS — M5136 Other intervertebral disc degeneration, lumbar region: Secondary | ICD-10-CM | POA: Diagnosis not present

## 2021-01-25 DIAGNOSIS — A419 Sepsis, unspecified organism: Secondary | ICD-10-CM | POA: Diagnosis not present

## 2021-01-25 DIAGNOSIS — G9341 Metabolic encephalopathy: Secondary | ICD-10-CM | POA: Diagnosis not present

## 2021-01-25 DIAGNOSIS — E876 Hypokalemia: Secondary | ICD-10-CM | POA: Diagnosis not present

## 2021-02-03 ENCOUNTER — Other Ambulatory Visit: Payer: Self-pay | Admitting: Nurse Practitioner

## 2021-02-03 DIAGNOSIS — Z8673 Personal history of transient ischemic attack (TIA), and cerebral infarction without residual deficits: Secondary | ICD-10-CM

## 2021-02-05 ENCOUNTER — Telehealth: Payer: Medicare Other

## 2021-02-05 ENCOUNTER — Ambulatory Visit (INDEPENDENT_AMBULATORY_CARE_PROVIDER_SITE_OTHER): Payer: Medicare Other

## 2021-02-05 ENCOUNTER — Telehealth: Payer: Self-pay

## 2021-02-05 DIAGNOSIS — W19XXXA Unspecified fall, initial encounter: Secondary | ICD-10-CM

## 2021-02-05 DIAGNOSIS — E559 Vitamin D deficiency, unspecified: Secondary | ICD-10-CM

## 2021-02-05 DIAGNOSIS — I2510 Atherosclerotic heart disease of native coronary artery without angina pectoris: Secondary | ICD-10-CM

## 2021-02-05 DIAGNOSIS — Z8673 Personal history of transient ischemic attack (TIA), and cerebral infarction without residual deficits: Secondary | ICD-10-CM

## 2021-02-05 DIAGNOSIS — F3341 Major depressive disorder, recurrent, in partial remission: Secondary | ICD-10-CM

## 2021-02-05 DIAGNOSIS — R413 Other amnesia: Secondary | ICD-10-CM

## 2021-02-05 DIAGNOSIS — E7800 Pure hypercholesterolemia, unspecified: Secondary | ICD-10-CM

## 2021-02-05 NOTE — Telephone Encounter (Signed)
  Care Management   Follow Up Note   02/05/2021 Name: [name] MRN: [id number] DOB: February 18, 1941   Referred by: Arnette Felts, FNP Reason for referral : Chronic Care Management (RN CM Follow up call )   An unsuccessful telephone outreach was attempted today. The patient was referred to the case management team for assistance with care management and care coordination.   Follow Up Plan: A HIPPA compliant phone message was left for the patient providing contact information and requesting a return call.   Delsa Sale, RN, BSN, CCM Care Management Coordinator [facility] Care Management/[facility]  Direct Phone: [phone]

## 2021-02-05 NOTE — Patient Instructions (Signed)
Visit Information   Thank you for taking time to visit with me today. Please don't hesitate to contact me if I can be of assistance to you before our next scheduled telephone appointment.  Following are the goals we discussed today:  (Copy and paste patient goals from clinical care plan here)  Our next appointment is by telephone on 03/15/21 at 11:50 AM  Please call the care guide team at [phone] if you need to cancel or reschedule your appointment.   If you are experiencing a Mental Health or [place] or need someone to talk to, please call 1-800-273-TALK (toll free, 24 hour hotline)   Following is a copy of your full care plan:   Care Plan : [place] of Care  Updates made by Lynne Logan, RN since 02/05/2021 12:00 AM     Problem: No plan of care established for management of chronic care states (Recurrent major depression, Atherosclerotic cardiovascular disease, Vitamin D deficiency, Memory changes, History of TIA, Fall, initial encounter, Elevated cholesterol)   Priority: High     Long-Range Goal: Development of plan of care for chronic care management (Recurrent major depression, Atherosclerotic cardiovascular disease, Vitamin D deficiency, Memory changes, History of TIA, Fall, initial encounter, Elevated cholesterol)   Start Date: 02/05/2021  Expected End Date: 02/05/2022  This Visit's Progress: On track  Priority: High  Note:   Current Barriers:  Knowledge Deficits related to plan of care for management of Recurrent major depression, Atherosclerotic cardiovascular disease, Vitamin D deficiency, Memory changes, History of TIA, Fall, initial encounter, Elevated cholesterol     Chronic Disease Management support and education needs related to Recurrent major depression, Atherosclerotic cardiovascular disease, Vitamin D deficiency, Memory changes, History of TIA, Fall, initial encounter, Elevated cholesterol      RNCM Clinical Goal(s):  Patient  will verbalize basic understanding of  Recurrent major depression, Atherosclerotic cardiovascular disease, Vitamin D deficiency, Memory changes, History of TIA, Fall, initial encounter, Elevated cholesterol    disease process and self health management plan as evidenced by patient will report having no disease exacerbations related to chronic disease states listed above take all medications exactly as prescribed and will call provider for medication related questions as evidenced by patient will report having no missed doses of her prescribed medications demonstrate Improved health management independence as evidenced by patient will maintain her ability to perform self care continue to work with RN Care Manager to address care management and care coordination needs related to  Recurrent major depression, Atherosclerotic cardiovascular disease, Vitamin D deficiency, Memory changes, History of TIA, Fall, initial encounter, Elevated cholesterol    as evidenced by adherence to CM Team Scheduled appointments demonstrate ongoing self health care management ability   as evidenced by    through collaboration with RN Care manager, provider, and care team.   Interventions: 1:1 collaboration with primary care provider regarding development and update of comprehensive plan of care as evidenced by provider attestation and co-signature Inter-disciplinary care team collaboration (see longitudinal plan of care) Evaluation of current treatment plan related to  self management and patient's adherence to plan as established by provider    Chronic Kidney Disease Interventions:  (Status:  Goal on track:  Yes.) Long Term Goal Evaluation of current treatment plan related to chronic kidney disease self management and patient's adherence to plan as established by provider      Reviewed prescribed diet increase water to 64 oz daily  Discussed complications of poorly controlled blood pressure  such as heart disease, stroke,  circulatory complications, vision complications, kidney impairment, sexual dysfunction    Provided education on kidney disease progression    Discussed plans with patient for ongoing care management follow up and provided patient with direct contact information for care management team Last practice recorded BP readings:  BP Readings from Last 3 Encounters:  11/01/20 116/82  10/25/20 121/62  08/22/20 114/80  Most recent eGFR/CrCl:  Lab Results  Component Value Date   EGFR 79 11/01/2020    No components found for: CRCL   Vitamin D deficiency Interventions:  (Status:  Goal on track:  Yes.)  Long Term Goal Evaluation of current treatment plan related to  Vitamin D deficiency , self-management and patient's adherence to plan as established by provider. Discussed plans with patient for ongoing care management follow up and provided patient with direct contact information for care management team Review of patient status, including review of consultant's reports, relevant laboratory and other test results, and medications completed. Reviewed medications with patient and discussed importance of medication adherence Discussed plans with patient for ongoing care management follow up and provided patient with direct contact information for care management team Vit D, 25-Hydroxy 30.0 - 100.0 ng/mL 35.7  41.8 CM  45.0 CM    Hyperlipidemia Interventions:  (Status:  Goal on track:  Yes.) Long Term Goal Medication review performed; medication list updated in electronic medical record.  Provider established cholesterol goals reviewed Counseled on importance of regular laboratory monitoring as prescribed Reviewed importance of limiting foods high in cholesterol Reviewed exercise goals and target of 150 minutes per week Assessed social determinant of health barriers  Discussed plans with patient for ongoing care management follow up and provided patient with direct contact information for care management  team   Depression Interventions:  (Status:  Goal on track:  Yes.)  Long Term Goal Evaluation of current treatment plan related to Depression, self-management and patient's adherence to plan as established by provider Review of patient status, including review of consultant's reports, relevant laboratory and other test results, and medications completed. Reviewed medications with patient and discussed importance of medication adherence Educated patient on the importance to keep all MD follow up appointments for ongoing evaluation/treatment of depression Educated patient on the effectiveness of using prayer, meditation and family/friend support to help with effective coping Encouraged patient to notify her health care provider of new or worsening symptoms of depression  Discussed plans with patient for ongoing care management follow up and provided patient with direct contact information for care management team  Patient Goals/Self-Care Activities: Take all medications as prescribed Attend all scheduled provider appointments Call pharmacy for medication refills 3-7 days in advance of running out of medications Attend church or other social activities Perform all self care activities independently  Perform IADL's (shopping, preparing meals, housekeeping, managing finances) independently Call provider office for new concerns or questions  Work with the social worker to address care coordination needs and will continue to work with the clinical team to address health care and disease management related needs adhere to prescribed diet: low fat develop an exercise routine  Follow Up Plan:  Telephone follow up appointment with care management team member scheduled for:  03/15/21      Consent to CCM Services: Ms. Kandel was given information about Chronic Care Management services including:  CCM service includes personalized support from designated clinical staff supervised by her physician,  including individualized plan of care and coordination with other care providers 24/7 contact  phone numbers for assistance for urgent and routine care needs. Service will only be billed when office clinical staff spend 20 minutes or more in a month to coordinate care. Only one practitioner may furnish and bill the service in a calendar month. The patient may stop CCM services at any time (effective at the end of the month) by phone call to the office staff. The patient will be responsible for cost sharing (co-pay) of up to 20% of the service fee (after annual deductible is met).  Patient agreed to services and verbal consent obtained.   The patient verbalized understanding of instructions, educational materials, and care plan provided today and declined offer to receive copy of patient instructions, educational materials, and care plan.   Telephone follow up appointment with care management team member scheduled for: 03/15/21

## 2021-02-05 NOTE — Chronic Care Management (AMB) (Signed)
Chronic Care Management   CCM RN Visit Note  02/05/2021 Name: [name] MRN: [id number] DOB: 1940-04-11  Subjective: [name] is a 80 y.o. year old female who is a primary care patient of [name], [place]. The care management team was consulted for assistance with disease management and care coordination needs.    Engaged with patient by telephone for follow up visit in response to provider referral for case management and/or care coordination services.   Consent to Services:  The patient was given information about Chronic Care Management services, agreed to services, and gave verbal consent prior to initiation of services.  Please see initial visit note for detailed documentation.   Patient agreed to services and verbal consent obtained.   Assessment: Review of patient past medical history, allergies, medications, health status, including review of consultants reports, laboratory and other test data, was performed as part of comprehensive evaluation and provision of chronic care management services.   SDOH (Social Determinants of Health) assessments and interventions performed: Yes, no acute challenges   CCM Care Plan  Allergies  Allergen Reactions   Aspirin Anaphylaxis and Hives   Elavil [Amitriptyline] Anaphylaxis   Latex    Relafen [Nabumetone] Hives    Outpatient Encounter Medications as of 02/05/2021  Medication Sig   acetaminophen (TYLENOL) 500 MG tablet Take 1,000 mg by mouth every 6 (six) hours as needed.   cetirizine (ZYRTEC) 10 MG tablet Take 10 mg by mouth daily.   Cholecalciferol (VITAMIN D) 2000 units tablet Take 2,000 Units by mouth daily.   clopidogrel (PLAVIX) 75 MG tablet TAKE 1 TABLET BY MOUTH EVERY DAY   fluticasone (FLONASE) 50 MCG/ACT nasal spray Place into both nostrils daily as needed for allergies or rhinitis.   Polyethyl Glycol-Propyl Glycol (SYSTANE OP) Apply to eye. As needed   pregabalin (LYRICA) 25 MG capsule Take 1 capsule 3 times  daily   sertraline (ZOLOFT) 50 MG tablet TAKE 1 TABLET BY MOUTH EVERY DAY   topiramate (TOPAMAX) 25 MG tablet Take one tablet in the morning and 3 in the evening   vitamin C (ASCORBIC ACID) 250 MG tablet Take 250 mg by mouth daily.    No facility-administered encounter medications on file as of 02/05/2021.    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 10/26/2019   Intermediate stage nonexudative age-related macular degeneration of both eyes 10/26/2019   Vitamin D deficiency 06/28/2019   Abnormal glucose 10/21/2018   History of TIA (transient ischemic attack) 06/17/2018   Recurrent major depressive disorder, in partial remission ([place]) 06/17/2018   Seasonal allergies 06/17/2018   Depression 02/11/2018   Hiatal hernia 02/11/2018   Dysphagia 02/11/2018   Hyperkalemia 02/11/2018   Abnormality of gait 04/10/2015   Cervical spondylosis without myelopathy 07/18/2013   Left-sided weakness 05/29/2013   Left sided numbness 05/29/2013   Headache 05/29/2013   TIA (transient ischemic attack) 05/29/2013   Neck pain on left side 05/29/2013   Fibromyalgia     Conditions to be addressed/monitored: Recurrent major depression, Atherosclerotic cardiovascular disease, Vitamin D deficiency, Memory changes, History of TIA, Fall, initial encounter, Elevated cholesterol  Care Plan : RN Care Manager Plan of Care  Updates made by Lynne Logan, RN since 02/05/2021 12:00 AM     Problem: No plan of care established for management of chronic care states (Recurrent major depression,  Atherosclerotic cardiovascular disease, Vitamin D deficiency, Memory changes, History of TIA, Fall, initial encounter, Elevated cholesterol)   Priority: High     Long-Range Goal: Development of plan of care for chronic care management (Recurrent major depression, Atherosclerotic cardiovascular disease, Vitamin D deficiency, Memory changes, History of TIA, Fall, initial encounter, Elevated cholesterol)   Start Date: 02/05/2021  Expected End Date: 02/05/2022  This Visit's Progress: On track  Priority: High  Note:   Current Barriers:  Knowledge Deficits related to plan of care for management of Recurrent major depression, Atherosclerotic cardiovascular disease, Vitamin D deficiency, Memory changes, History of TIA, Fall, initial encounter, Elevated cholesterol     Chronic Disease Management support and education needs related to Recurrent major depression, Atherosclerotic cardiovascular disease, Vitamin D deficiency, Memory changes, History of TIA, Fall, initial encounter, Elevated cholesterol      RNCM Clinical Goal(s):  Patient will verbalize basic understanding of  Recurrent major depression, Atherosclerotic cardiovascular disease, Vitamin D deficiency, Memory changes, History of TIA, Fall, initial encounter, Elevated cholesterol    disease process and self health management plan as evidenced by patient will report having no disease exacerbations related to chronic disease states listed above take all medications exactly as prescribed and will call provider for medication related questions as evidenced by patient will report having no missed doses of her prescribed medications demonstrate Improved health management independence as evidenced by patient will maintain her ability to perform self care continue to work with RN Care Manager to address care management and care coordination needs related to  Recurrent major depression, Atherosclerotic cardiovascular disease, Vitamin D deficiency, Memory  changes, History of TIA, Fall, initial encounter, Elevated cholesterol    as evidenced by adherence to CM Team Scheduled appointments demonstrate ongoing self health care management ability   as evidenced by    through collaboration with RN Care manager, provider, and care team.   Interventions: 1:1 collaboration with primary care provider regarding development and update of comprehensive plan of care as evidenced by provider attestation and co-signature Inter-disciplinary care team collaboration (see longitudinal plan of care) Evaluation of current treatment plan related to  self management and patient's adherence to plan as established by provider    Chronic Kidney Disease Interventions:  (Status:  Goal on track:  Yes.) Long Term Goal Evaluation of current treatment plan related to chronic kidney disease self management and patient's adherence to plan as established by provider      Reviewed prescribed diet increase water to 64 oz daily  Discussed complications of poorly controlled blood pressure such as heart disease, stroke, circulatory complications, vision complications, kidney impairment, sexual dysfunction    Provided education on kidney disease progression    Discussed plans with patient for ongoing care management follow up and provided patient with direct contact information for care management team Last practice recorded BP readings:  BP Readings from Last 3 Encounters:  11/01/20 116/82  10/25/20 121/62  08/22/20 114/80  Most recent eGFR/CrCl:  Lab Results  Component Value Date   EGFR 79 11/01/2020    No components found for: CRCL   Vitamin D deficiency Interventions:  (Status:  Goal on track:  Yes.)  Long Term Goal Evaluation of current treatment plan related to  Vitamin D deficiency , self-management  and patient's adherence to plan as established by provider. Discussed plans with patient for ongoing care management follow up and provided patient with direct contact  information for care management team Review of patient status, including review of consultant's reports, relevant laboratory and other test results, and medications completed. Reviewed medications with patient and discussed importance of medication adherence Discussed plans with patient for ongoing care management follow up and provided patient with direct contact information for care management team Vit D, 25-Hydroxy 30.0 - 100.0 ng/mL 35.7  41.8 CM  45.0 CM    Hyperlipidemia Interventions:  (Status:  Goal on track:  Yes.) Long Term Goal Medication review performed; medication list updated in electronic medical record.  Provider established cholesterol goals reviewed Counseled on importance of regular laboratory monitoring as prescribed Reviewed importance of limiting foods high in cholesterol Reviewed exercise goals and target of 150 minutes per week Assessed social determinant of health barriers  Discussed plans with patient for ongoing care management follow up and provided patient with direct contact information for care management team   Depression Interventions:  (Status:  Goal on track:  Yes.)  Long Term Goal Evaluation of current treatment plan related to Depression, self-management and patient's adherence to plan as established by provider Review of patient status, including review of consultant's reports, relevant laboratory and other test results, and medications completed. Reviewed medications with patient and discussed importance of medication adherence Educated patient on the importance to keep all MD follow up appointments for ongoing evaluation/treatment of depression Educated patient on the effectiveness of using prayer, meditation and family/friend support to help with effective coping Encouraged patient to notify her health care provider of new or worsening symptoms of depression  Discussed plans with patient for ongoing care management follow up and provided patient with  direct contact information for care management team  Patient Goals/Self-Care Activities: Take all medications as prescribed Attend all scheduled provider appointments Call pharmacy for medication refills 3-7 days in advance of running out of medications Attend church or other social activities Perform all self care activities independently  Perform IADL's (shopping, preparing meals, housekeeping, managing finances) independently Call provider office for new concerns or questions  Work with the social worker to address care coordination needs and will continue to work with the clinical team to address health care and disease management related needs adhere to prescribed diet: low fat develop an exercise routine  Follow Up Plan:  Telephone follow up appointment with care management team member scheduled for:  03/15/21      Plan:Telephone follow up appointment with care management team member scheduled for:  03/15/21  Barb Merino, RN, BSN, CCM Care Management Coordinator [facility]  Direct Phone: [phone]

## 2021-02-06 DIAGNOSIS — F3341 Major depressive disorder, recurrent, in partial remission: Secondary | ICD-10-CM | POA: Diagnosis not present

## 2021-02-06 DIAGNOSIS — I2510 Atherosclerotic heart disease of native coronary artery without angina pectoris: Secondary | ICD-10-CM

## 2021-02-06 DIAGNOSIS — E7800 Pure hypercholesterolemia, unspecified: Secondary | ICD-10-CM

## 2021-02-19 ENCOUNTER — Encounter (INDEPENDENT_AMBULATORY_CARE_PROVIDER_SITE_OTHER): Payer: Self-pay | Admitting: Ophthalmology

## 2021-02-19 ENCOUNTER — Ambulatory Visit (INDEPENDENT_AMBULATORY_CARE_PROVIDER_SITE_OTHER): Payer: Medicare Other | Admitting: Ophthalmology

## 2021-02-19 ENCOUNTER — Other Ambulatory Visit: Payer: Self-pay

## 2021-02-19 DIAGNOSIS — H353211 Exudative age-related macular degeneration, right eye, with active choroidal neovascularization: Secondary | ICD-10-CM | POA: Diagnosis not present

## 2021-02-19 DIAGNOSIS — H2512 Age-related nuclear cataract, left eye: Secondary | ICD-10-CM

## 2021-02-19 DIAGNOSIS — H353132 Nonexudative age-related macular degeneration, bilateral, intermediate dry stage: Secondary | ICD-10-CM | POA: Diagnosis not present

## 2021-02-19 DIAGNOSIS — H35721 Serous detachment of retinal pigment epithelium, right eye: Secondary | ICD-10-CM

## 2021-02-19 MED ORDER — BEVACIZUMAB 2.5 MG/0.1ML IZ SOSY
2.5000 mg | PREFILLED_SYRINGE | INTRAVITREAL | Status: AC | PRN
Start: 2021-02-19 — End: 2021-02-19
  Administered 2021-02-19: 2.5 mg via INTRAVITREAL

## 2021-02-19 NOTE — Assessment & Plan Note (Signed)
Serous retinal fluid recurrent on 02/19/2021

## 2021-02-19 NOTE — Assessment & Plan Note (Signed)
May proceed with cataract surgery in the left eye at any time

## 2021-02-19 NOTE — Assessment & Plan Note (Signed)
No sign of CNVM OS 

## 2021-02-19 NOTE — Assessment & Plan Note (Addendum)
Recurrent onset subretinal fluid today, repeat injection today follow-up next in 6 weeks

## 2021-02-19 NOTE — Progress Notes (Signed)
02/19/2021     CHIEF COMPLAINT Patient presents for  Chief Complaint  Patient presents with   Retina Follow Up      HISTORY OF PRESENT ILLNESS: [name] is a 80 y.o. female who presents to the clinic today for:   HPI     Retina Follow Up           Diagnosis: Wet AMD   Laterality: right eye   Onset: 8   Severity: mild   Duration: 8 weeks   Course: stable         Comments   8 week fu OU oct avastin OD. Patient states vision is stable and unchanged since last visit. Denies any new floaters or FOL.       Last edited by Nelva Nay on 02/19/2021  1:28 PM.      Referring physician: Arnette Felts, FNP [address] [place],  [place] [address]  HISTORICAL INFORMATION:   Selected notes from the MEDICAL RECORD NUMBER    Lab Results  Component Value Date   HGBA1C 5.5 10/27/2019     CURRENT MEDICATIONS: Current Outpatient Medications (Ophthalmic Drugs)  Medication Sig   Polyethyl Glycol-Propyl Glycol (SYSTANE OP) Apply to eye. As needed   No current facility-administered medications for this visit. (Ophthalmic Drugs)   Current Outpatient Medications (Other)  Medication Sig   acetaminophen (TYLENOL) 500 MG tablet Take 1,000 mg by mouth every 6 (six) hours as needed.   cetirizine (ZYRTEC) 10 MG tablet Take 10 mg by mouth daily.   Cholecalciferol (VITAMIN D) 2000 units tablet Take 2,000 Units by mouth daily.   clopidogrel (PLAVIX) 75 MG tablet TAKE 1 TABLET BY MOUTH EVERY DAY   fluticasone (FLONASE) 50 MCG/ACT nasal spray Place into both nostrils daily as needed for allergies or rhinitis.   pregabalin (LYRICA) 25 MG capsule Take 1 capsule 3 times daily   sertraline (ZOLOFT) 50 MG tablet TAKE 1 TABLET BY MOUTH EVERY DAY   topiramate (TOPAMAX) 25 MG tablet Take one tablet in the morning and 3 in the evening   vitamin C (ASCORBIC ACID) 250 MG tablet Take 250 mg by mouth daily.    No current facility-administered medications for this  visit. (Other)      REVIEW OF SYSTEMS:    ALLERGIES Allergies  Allergen Reactions   Aspirin Anaphylaxis and Hives   Elavil [Amitriptyline] Anaphylaxis   Latex    Relafen [Nabumetone] Hives    PAST MEDICAL HISTORY Past Medical History:  Diagnosis Date   Abnormality of gait 04/10/2015   Cervical spondylosis without myelopathy 07/18/2013   Common migraine with intractable migraine 04/10/2015   DJD (degenerative joint disease)    Fibromyalgia    Hiatal hernia    Migraine headache    Vitreomacular adhesion of right eye 03/08/2020   Past Surgical History:  Procedure Laterality Date   ABDOMINAL HYSTERECTOMY     DILATION AND CURETTAGE OF UTERUS     HIATAL HERNIA REPAIR     TONSILLECTOMY      FAMILY HISTORY Family History  Problem Relation Age of Onset   CVA Mother    Stroke Father    Emphysema Father     SOCIAL HISTORY Social History   Tobacco Use   Smoking status: Never   Smokeless tobacco: Never  Vaping Use   Vaping Use: Never used  Substance Use Topics   Alcohol use: No    Comment: very occasional   Drug use: No  OPHTHALMIC EXAM:  Base Eye Exam     Visual Acuity (ETDRS)       Right Left   Dist cc 20/40 20/25 -1   Dist ph cc NI     Correction: Glasses         Tonometry (Tonopen, 1:33 PM)       Right Left   Pressure 9 11         Pupils       Pupils Dark Light APD   Right PERRL 4 3 None   Left PERRL 4 3 None         Visual Fields (Counting fingers)       Left Right    Full Full         Extraocular Movement       Right Left    Full Full         Neuro/Psych     Oriented x3: Yes   Mood/Affect: Normal         Dilation     Both eyes: 1.0% Mydriacyl, 2.5% Phenylephrine @ 1:33 PM           Slit Lamp and Fundus Exam     External Exam       Right Left   External Normal Normal         Slit Lamp Exam       Right Left   Lids/Lashes Normal Normal   Conjunctiva/Sclera White and quiet White and  quiet   Cornea Clear Clear   Anterior Chamber Deep and quiet Deep and quiet   Iris Round and reactive Round and reactive   Lens Centered posterior chamber intraocular lens, Open posterior capsule 2+ Nuclear sclerosis   Anterior Vitreous Normal Normal         Fundus Exam       Right Left   Posterior Vitreous Central vitreous floaters Posterior vitreous detachment   Disc Normal Normal   C/D Ratio 0.2 0.15   Macula less Macular thickening, Smaller Subretinal neovascular membrane, Retinal pigment epithelial mottling and hyperpigmentation subfoveal, Early age related macular degeneration Early age related macular degeneration, Hard drusen   Vessels Normal Normal   Periphery Normal Normal            IMAGING AND PROCEDURES  Imaging and Procedures for 02/19/21  Intravitreal Injection, Pharmacologic Agent - OD - Right Eye       Time Out 02/19/2021. 2:00 PM. Confirmed correct patient, procedure, site, and patient consented.   Anesthesia Topical anesthesia was used. Anesthetic medications included Akten 3.5%.   Procedure Preparation included Ofloxacin , Tobramycin 0.3%, 10% betadine to eyelids, 5% betadine to ocular surface. A 30 gauge needle was used.   Injection: 2.5 mg bevacizumab 2.5 MG/0.1ML   Route: Intravitreal, Site: Right Eye   NDC: [phone], Lot: [id number]   Post-op Post injection exam found visual acuity of at least counting fingers. The patient tolerated the procedure well. There were no complications. The patient received written and verbal post procedure care education. Post injection medications included ocuflox.      OCT, Retina - OU - Both Eyes       Right Eye Quality was good. Scan locations included subfoveal. Central Foveal Thickness: 275. Progression has worsened. Findings include vitreous traction, no IRF, abnormal foveal contour, pigment epithelial detachment.   Left Eye Quality was good. Scan locations included subfoveal. Central Foveal  Thickness: 270. Progression has been stable. Findings include retinal drusen .   Notes OD with vascularized  PED with much less subretinal fluid today at follow-up interval of 8 weeks.  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 Exudative age-related macular degeneration of right eye with active choroidal neovascularization (HCC)  H35.3211 Intravitreal Injection, Pharmacologic Agent - OD - Right Eye    OCT, Retina - OU - Both Eyes    bevacizumab (AVASTIN) SOSY 2.5 mg    2. Nuclear sclerotic cataract of left eye  H25.12     3. Intermediate stage nonexudative age-related macular degeneration of both eyes  H35.3132     4. 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  OD with recurrence of CNVM and serous retinal detachment subfoveal location at 8-week interval we will need to repeat injection today and shorten the interval again at 6 weeks  2.  3.  Ophthalmic Meds Ordered this visit:  Meds ordered this encounter  Medications   bevacizumab (AVASTIN) SOSY 2.5 mg       Return in about 6 weeks (around 04/02/2021) for dilate, OD, AVASTIN OCT.  There are no Patient Instructions on file for this visit.   Explained the diagnoses, plan, and follow up with the patient and they expressed understanding.  Patient expressed understanding of the importance of proper follow up  care.   Alford Highland Trevious Rampey M.D. Diseases & Surgery of the Retina and Vitreous [facility] 02/19/21     Abbreviations: M myopia (nearsighted); A astigmatism; H hyperopia (farsighted); P presbyopia; Mrx spectacle prescription;  CTL contact lenses; OD right eye; OS left eye; OU both eyes  XT exotropia; ET esotropia; PEK punctate epithelial keratitis; PEE punctate epithelial erosions; DES dry eye syndrome; MGD meibomian gland dysfunction; ATs artificial tears; PFAT's preservative free artificial tears; NSC nuclear sclerotic cataract; PSC posterior subcapsular cataract; ERM epi-retinal membrane; PVD posterior vitreous detachment; RD retinal detachment; DM diabetes mellitus; DR diabetic retinopathy; NPDR non-proliferative diabetic retinopathy; PDR proliferative diabetic retinopathy; CSME clinically significant macular edema; DME diabetic macular edema; dbh dot blot hemorrhages; CWS cotton wool spot; POAG primary open angle glaucoma; C/D cup-to-disc ratio; HVF humphrey visual field; GVF goldmann visual field; OCT optical coherence tomography; IOP intraocular pressure; BRVO Branch retinal vein occlusion; CRVO central retinal vein occlusion; CRAO central retinal artery occlusion; BRAO branch retinal artery occlusion; RT retinal tear; SB scleral buckle; PPV pars plana vitrectomy; VH Vitreous hemorrhage; PRP panretinal laser photocoagulation; IVK intravitreal kenalog; VMT vitreomacular traction; MH Macular hole;  NVD neovascularization of the disc; NVE neovascularization elsewhere; AREDS age related eye disease study; ARMD age related macular degeneration; POAG primary open angle glaucoma; EBMD epithelial/anterior basement membrane dystrophy; ACIOL anterior chamber intraocular lens; IOL intraocular lens; PCIOL posterior chamber intraocular lens; Phaco/IOL phacoemulsification with intraocular lens placement; PRK photorefractive keratectomy; LASIK laser assisted in situ keratomileusis; HTN hypertension;  DM diabetes mellitus; COPD chronic obstructive pulmonary disease

## 2021-02-24 DIAGNOSIS — E876 Hypokalemia: Secondary | ICD-10-CM | POA: Diagnosis not present

## 2021-02-24 DIAGNOSIS — A419 Sepsis, unspecified organism: Secondary | ICD-10-CM | POA: Diagnosis not present

## 2021-02-24 DIAGNOSIS — G9341 Metabolic encephalopathy: Secondary | ICD-10-CM | POA: Diagnosis not present

## 2021-03-15 ENCOUNTER — Telehealth: Payer: Medicare Other

## 2021-03-27 DIAGNOSIS — E876 Hypokalemia: Secondary | ICD-10-CM | POA: Diagnosis not present

## 2021-03-27 DIAGNOSIS — G9341 Metabolic encephalopathy: Secondary | ICD-10-CM | POA: Diagnosis not present

## 2021-03-27 DIAGNOSIS — A419 Sepsis, unspecified organism: Secondary | ICD-10-CM | POA: Diagnosis not present

## 2021-04-02 ENCOUNTER — Ambulatory Visit (INDEPENDENT_AMBULATORY_CARE_PROVIDER_SITE_OTHER): Payer: Medicare Other | Admitting: Ophthalmology

## 2021-04-02 ENCOUNTER — Encounter (INDEPENDENT_AMBULATORY_CARE_PROVIDER_SITE_OTHER): Payer: Self-pay | Admitting: Ophthalmology

## 2021-04-02 ENCOUNTER — Other Ambulatory Visit: Payer: Self-pay

## 2021-04-02 DIAGNOSIS — H353132 Nonexudative age-related macular degeneration, bilateral, intermediate dry stage: Secondary | ICD-10-CM | POA: Diagnosis not present

## 2021-04-02 DIAGNOSIS — H353211 Exudative age-related macular degeneration, right eye, with active choroidal neovascularization: Secondary | ICD-10-CM

## 2021-04-02 MED ORDER — BEVACIZUMAB 2.5 MG/0.1ML IZ SOSY
2.5000 mg | PREFILLED_SYRINGE | INTRAVITREAL | Status: AC | PRN
  Administered 2021-04-02: 15:00:00 2.5 mg via INTRAVITREAL

## 2021-04-02 NOTE — Assessment & Plan Note (Signed)
The nature of wet macular degeneration was discussed with the patient.  Forms of therapy reviewed include the use of Anti-VEGF medications injected painlessly into the eye, as well as other possible treatment modalities, including thermal laser therapy. Fellow eye involvement and risks were discussed with the patient. Upon the finding of wet age related macular degeneration, treatment will be offered. The treatment regimen is on a treat as needed basis with the intent to treat if necessary and extend interval of exams when possible. On average 1 out of 6 patients do not need lifetime therapy. However, the risk of recurrent disease is high for a lifetime.  Initially monthly, then periodic, examinations and evaluations will determine whether the next treatment is required on the day of the examination.  Still active component of disease with with serous subretinal fluid.

## 2021-04-02 NOTE — Assessment & Plan Note (Signed)
No sign of CNVM OS 

## 2021-04-02 NOTE — Progress Notes (Signed)
04/02/2021     CHIEF COMPLAINT Patient presents for  Chief Complaint  Patient presents with   Retina Follow Up      HISTORY OF PRESENT ILLNESS: [name] is a 81 y.o. female who presents to the clinic today for:   HPI     Retina Follow Up           Diagnosis: Wet AMD   Laterality: right eye   Onset: 6   Severity: mild   Duration: 6 weeks   Course: stable         Comments   6 weeks dilate OD, Avastin OCT. Patient states vision is stable and unchanged since last visit. Denies any new floaters or FOL. Pt states "when I don't sleep at night I notice it looks like I am seeing a card game or crossword puzzle in front of me but it is not in front of me."       Last edited by Laurin Coder on 04/02/2021  2:46 PM.      Referring physician: [name], [place],  [place] [address]  HISTORICAL INFORMATION:   Selected notes from the [facility]    Lab Results  Component Value Date   HGBA1C 5.5 10/27/2019     CURRENT MEDICATIONS: Current Outpatient Medications (Ophthalmic Drugs)  Medication Sig   Polyethyl Glycol-Propyl Glycol (SYSTANE OP) Apply to eye. As needed   No current facility-administered medications for this visit. (Ophthalmic Drugs)   Current Outpatient Medications (Other)  Medication Sig   acetaminophen (TYLENOL) 500 MG tablet Take 1,000 mg by mouth every 6 (six) hours as needed.   cetirizine (ZYRTEC) 10 MG tablet Take 10 mg by mouth daily.   Cholecalciferol (VITAMIN D) 2000 units tablet Take 2,000 Units by mouth daily.   clopidogrel (PLAVIX) 75 MG tablet TAKE 1 TABLET BY MOUTH EVERY DAY   fluticasone (FLONASE) 50 MCG/ACT nasal spray Place into both nostrils daily as needed for allergies or rhinitis.   pregabalin (LYRICA) 25 MG capsule Take 1 capsule 3 times daily   sertraline (ZOLOFT) 50 MG tablet TAKE 1 TABLET BY MOUTH EVERY DAY   topiramate (TOPAMAX) 25 MG tablet Take one tablet in the morning  and 3 in the evening   vitamin C (ASCORBIC ACID) 250 MG tablet Take 250 mg by mouth daily.    No current facility-administered medications for this visit. (Other)      REVIEW OF SYSTEMS: ROS   Negative for: Constitutional, Gastrointestinal, Neurological, Skin, Genitourinary, Musculoskeletal, HENT, Endocrine, Cardiovascular, Eyes, Respiratory, Psychiatric, Allergic/Imm, Heme/Lymph Last edited by Hurman Horn, MD on 04/02/2021  3:28 PM.       ALLERGIES Allergies  Allergen Reactions   Aspirin Anaphylaxis and Hives   Elavil [Amitriptyline] Anaphylaxis   Latex    Relafen [Nabumetone] Hives    PAST MEDICAL HISTORY Past Medical History:  Diagnosis Date   Abnormality of gait 04/10/2015   Cervical spondylosis without myelopathy 07/18/2013   Common migraine with intractable migraine 04/10/2015   DJD (degenerative joint disease)    Fibromyalgia    Hiatal hernia    Migraine headache    Vitreomacular adhesion of right eye 03/08/2020   Past Surgical History:  Procedure Laterality Date   ABDOMINAL HYSTERECTOMY     DILATION AND CURETTAGE OF UTERUS     HIATAL HERNIA REPAIR     TONSILLECTOMY      FAMILY HISTORY Family History  Problem Relation Age of Onset  CVA Mother    Stroke Father    Emphysema Father     SOCIAL HISTORY Social History   Tobacco Use   Smoking status: Never   Smokeless tobacco: Never  Vaping Use   Vaping Use: Never used  Substance Use Topics   Alcohol use: No    Comment: very occasional   Drug use: No         OPHTHALMIC EXAM:  Base Eye Exam     Visual Acuity (ETDRS)       Right Left   Dist cc 20/50 -1 20/25 -2   Dist ph cc 20/40 -1     Correction: Glasses         Tonometry (Tonopen, 2:46 PM)       Right Left   Pressure 12 12         Pupils       Pupils Dark Light APD   Right PERRL 4 3 None   Left PERRL 4 3 None         Extraocular Movement       Right Left    Full Full         Neuro/Psych     Oriented x3:  Yes   Mood/Affect: Normal         Dilation     Right eye: 1.0% Mydriacyl, 2.5% Phenylephrine @ 2:46 PM           Slit Lamp and Fundus Exam     External Exam       Right Left   External Normal Normal         Slit Lamp Exam       Right Left   Lids/Lashes Normal Normal   Conjunctiva/Sclera White and quiet White and quiet   Cornea Clear Clear   Anterior Chamber Deep and quiet Deep and quiet   Iris Round and reactive Round and reactive   Lens Centered posterior chamber intraocular lens, Open posterior capsule 2+ Nuclear sclerosis   Anterior Vitreous Normal Normal         Fundus Exam       Right Left   Posterior Vitreous Central vitreous floaters    Disc Normal    C/D Ratio 0.2    Macula less Macular thickening, Smaller Subretinal neovascular membrane, Retinal pigment epithelial mottling and hyperpigmentation subfoveal, Early age related macular degeneration, 78D exam    Vessels Normal    Periphery Normal             IMAGING AND PROCEDURES  Imaging and Procedures for 04/02/21  Intravitreal Injection, Pharmacologic Agent - OD - Right Eye       Time Out 04/02/2021. 3:29 PM. Confirmed correct patient, procedure, site, and patient consented.   Anesthesia Topical anesthesia was used. Anesthetic medications included Lidocaine 4%.   Procedure Preparation included Ofloxacin , Tobramycin 0.3%, 10% betadine to eyelids, 5% betadine to ocular surface. A 30 gauge needle was used.   Injection: 2.5 mg bevacizumab 2.5 MG/0.1ML   Route: Intravitreal, Site: Right Eye   NDC: [phone], Lot: [id number]   Post-op Post injection exam found visual acuity of at least counting fingers. The patient tolerated the procedure well. There were no complications. The patient received written and verbal post procedure care education. Post injection medications included ocuflox.      OCT, Retina - OU - Both Eyes       Right Eye Quality was good. Scan locations included  subfoveal. Central Foveal Thickness: 372. Progression has worsened.  Findings include vitreous traction, no IRF, abnormal foveal contour, pigment epithelial detachment.   Left Eye Quality was good. Scan locations included subfoveal. Central Foveal Thickness: 267. Progression has been stable. Findings include retinal drusen .   Notes OD with vascularized PED with much less subretinal fluid today at follow-up interval of 6 weeks.  Apparent posterior vitreous detachment now  OS with incidental posterior vitreous detachment             ASSESSMENT/PLAN:  Exudative age-related macular degeneration of right eye with active choroidal neovascularization (HCC) The nature of wet macular degeneration was discussed with the patient.  Forms of therapy reviewed include the use of Anti-VEGF medications injected painlessly into the eye, as well as other possible treatment modalities, including thermal laser therapy. Fellow eye involvement and risks were discussed with the patient. Upon the finding of wet age related macular degeneration, treatment will be offered. The treatment regimen is on a treat as needed basis with the intent to treat if necessary and extend interval of exams when possible. On average 1 out of 6 patients do not need lifetime therapy. However, the risk of recurrent disease is high for a lifetime.  Initially monthly, then periodic, examinations and evaluations will determine whether the next treatment is required on the day of the examination.  Still active component of disease with with serous subretinal fluid.  Intermediate stage nonexudative age-related macular degeneration of both eyes No sign of CNVM OS     ICD-10-CM   1. Exudative age-related macular degeneration of right eye with active choroidal neovascularization (HCC)  H35.3211 Intravitreal Injection, Pharmacologic Agent - OD - Right Eye    OCT, Retina - OU - Both Eyes    bevacizumab (AVASTIN) SOSY 2.5 mg    2.  Intermediate stage nonexudative age-related macular degeneration of both eyes  H35.3132       1.  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  Patient understands this and attempt to control the condition so as to not allow progression of CNVM disease 2.  OS free of disease by OCT today  3.  Late OU  Ophthalmic Meds Ordered this visit:  Meds ordered this encounter  Medications   bevacizumab (AVASTIN) SOSY 2.5 mg       Return in about 6 weeks (around 05/14/2021) for DILATE OU, COLOR FP, AVASTIN OCT, OD.  There are no Patient Instructions on file for this visit.   Explained the diagnoses, plan, and follow up with the patient and they expressed understanding.  Patient expressed understanding of the importance of proper follow up care.   Clent Demark Tylar Amborn M.D. Diseases & Surgery of the Retina and Vitreous [facility] 04/02/21     Abbreviations: M myopia (nearsighted); A astigmatism; H hyperopia (farsighted); P presbyopia; Mrx spectacle prescription;  CTL contact lenses; OD right eye; OS left eye; OU both eyes  XT exotropia; ET esotropia; PEK punctate epithelial keratitis; PEE punctate epithelial erosions; DES dry eye syndrome; MGD meibomian gland dysfunction; ATs artificial tears; PFAT's preservative free artificial tears; [place] nuclear sclerotic cataract; PSC posterior subcapsular cataract; ERM epi-retinal membrane; PVD posterior vitreous detachment; RD retinal detachment; DM diabetes mellitus; DR diabetic retinopathy; NPDR non-proliferative diabetic retinopathy; PDR proliferative diabetic retinopathy; CSME clinically significant macular edema; DME diabetic macular edema; dbh dot blot hemorrhages; CWS cotton wool spot; POAG primary open angle glaucoma; C/D cup-to-disc ratio; HVF humphrey visual field; GVF goldmann visual field; OCT optical coherence tomography; IOP  intraocular pressure; BRVO Branch retinal vein occlusion;  CRVO central retinal vein occlusion; CRAO central retinal artery occlusion; BRAO branch retinal artery occlusion; RT retinal tear; SB scleral buckle; PPV pars plana vitrectomy; VH Vitreous hemorrhage; PRP panretinal laser photocoagulation; IVK intravitreal kenalog; VMT vitreomacular traction; MH Macular hole;  NVD neovascularization of the disc; NVE neovascularization elsewhere; AREDS age related eye disease study; ARMD age related macular degeneration; POAG primary open angle glaucoma; EBMD epithelial/anterior basement membrane dystrophy; ACIOL anterior chamber intraocular lens; IOL intraocular lens; PCIOL posterior chamber intraocular lens; Phaco/IOL phacoemulsification with intraocular lens placement; [place] photorefractive keratectomy; LASIK laser assisted in situ keratomileusis; HTN hypertension; DM diabetes mellitus; COPD chronic obstructive pulmonary disease

## 2021-04-27 DIAGNOSIS — G9341 Metabolic encephalopathy: Secondary | ICD-10-CM | POA: Diagnosis not present

## 2021-04-27 DIAGNOSIS — E876 Hypokalemia: Secondary | ICD-10-CM | POA: Diagnosis not present

## 2021-04-27 DIAGNOSIS — A419 Sepsis, unspecified organism: Secondary | ICD-10-CM | POA: Diagnosis not present

## 2021-05-14 ENCOUNTER — Ambulatory Visit (INDEPENDENT_AMBULATORY_CARE_PROVIDER_SITE_OTHER): Payer: Medicare Other | Admitting: Ophthalmology

## 2021-05-14 ENCOUNTER — Encounter (INDEPENDENT_AMBULATORY_CARE_PROVIDER_SITE_OTHER): Payer: Self-pay | Admitting: Ophthalmology

## 2021-05-14 ENCOUNTER — Other Ambulatory Visit: Payer: Self-pay

## 2021-05-14 DIAGNOSIS — H35721 Serous detachment of retinal pigment epithelium, right eye: Secondary | ICD-10-CM | POA: Diagnosis not present

## 2021-05-14 DIAGNOSIS — H353211 Exudative age-related macular degeneration, right eye, with active choroidal neovascularization: Secondary | ICD-10-CM | POA: Diagnosis not present

## 2021-05-14 DIAGNOSIS — H2512 Age-related nuclear cataract, left eye: Secondary | ICD-10-CM

## 2021-05-14 DIAGNOSIS — H353132 Nonexudative age-related macular degeneration, bilateral, intermediate dry stage: Secondary | ICD-10-CM | POA: Diagnosis not present

## 2021-05-14 MED ORDER — BEVACIZUMAB 2.5 MG/0.1ML IZ SOSY
2.5000 mg | PREFILLED_SYRINGE | INTRAVITREAL | Status: AC | PRN
  Administered 2021-05-14: 2.5 mg via INTRAVITREAL

## 2021-05-14 NOTE — Assessment & Plan Note (Addendum)
The nature of cataract was discussed with the patient as well as the elective nature of surgery. The patient was reassured that surgery at a later date does not put the patient at risk for a worse outcome. It was emphasized that the need for surgery is dictated by the patient's quality of life as influenced by the cataract. Patient was instructed to maintain close follow up with their general eye care doctor. ? ?OS follow-up with Dr. Dione Booze as scheduled  ? ? ?

## 2021-05-14 NOTE — Progress Notes (Signed)
05/14/2021     CHIEF COMPLAINT Patient presents for  Chief Complaint  Patient presents with   Macular Degeneration      HISTORY OF PRESENT ILLNESS: [name] is a 81 y.o. female who presents to the clinic today for:   HPI   6 weeks for dilate ou, color fp, avastin oct, od. Pt states no vision changes and medical history. Pt states no floaters or flashes of light.  Pt uses "Refresh" eye drops prescribed.  Last edited by Angeline Slim on 05/14/2021  2:20 PM.      Referring physician: Arnette Felts, FNP [address] [place],  [place] [address]  HISTORICAL INFORMATION:   Selected notes from the MEDICAL RECORD NUMBER    Lab Results  Component Value Date   HGBA1C 5.5 10/27/2019     CURRENT MEDICATIONS: Current Outpatient Medications (Ophthalmic Drugs)  Medication Sig   Polyethyl Glycol-Propyl Glycol (SYSTANE OP) Apply to eye. As needed   No current facility-administered medications for this visit. (Ophthalmic Drugs)   Current Outpatient Medications (Other)  Medication Sig   acetaminophen (TYLENOL) 500 MG tablet Take 1,000 mg by mouth every 6 (six) hours as needed.   cetirizine (ZYRTEC) 10 MG tablet Take 10 mg by mouth daily.   Cholecalciferol (VITAMIN D) 2000 units tablet Take 2,000 Units by mouth daily.   clopidogrel (PLAVIX) 75 MG tablet TAKE 1 TABLET BY MOUTH EVERY DAY   fluticasone (FLONASE) 50 MCG/ACT nasal spray Place into both nostrils daily as needed for allergies or rhinitis.   pregabalin (LYRICA) 25 MG capsule Take 1 capsule 3 times daily   sertraline (ZOLOFT) 50 MG tablet TAKE 1 TABLET BY MOUTH EVERY DAY   topiramate (TOPAMAX) 25 MG tablet Take one tablet in the morning and 3 in the evening   vitamin C (ASCORBIC ACID) 250 MG tablet Take 250 mg by mouth daily.    No current facility-administered medications for this visit. (Other)      REVIEW OF SYSTEMS: ROS   Negative for: Constitutional, Gastrointestinal, Neurological, Skin,  Genitourinary, Musculoskeletal, HENT, Endocrine, Cardiovascular, Eyes, Respiratory, Psychiatric, Allergic/Imm, Heme/Lymph Last edited by Angeline Slim on 05/14/2021  2:20 PM.       ALLERGIES Allergies  Allergen Reactions   Aspirin Anaphylaxis and Hives   Elavil [Amitriptyline] Anaphylaxis   Latex    Relafen [Nabumetone] Hives    PAST MEDICAL HISTORY Past Medical History:  Diagnosis Date   Abnormality of gait 04/10/2015   Cervical spondylosis without myelopathy 07/18/2013   Common migraine with intractable migraine 04/10/2015   DJD (degenerative joint disease)    Fibromyalgia    Hiatal hernia    Migraine headache    Vitreomacular adhesion of right eye 03/08/2020   Past Surgical History:  Procedure Laterality Date   ABDOMINAL HYSTERECTOMY     DILATION AND CURETTAGE OF UTERUS     HIATAL HERNIA REPAIR     TONSILLECTOMY      FAMILY HISTORY Family History  Problem Relation Age of Onset   CVA Mother    Stroke Father    Emphysema Father     SOCIAL HISTORY Social History   Tobacco Use   Smoking status: Never   Smokeless tobacco: Never  Vaping Use   Vaping Use: Never used  Substance Use Topics   Alcohol use: No    Comment: very occasional   Drug use: No         OPHTHALMIC EXAM:  Base Eye Exam     Visual  Acuity (Snellen - Linear)       Right Left   Dist [place] 20/40 -1 20/40   Dist ph [place] 20/30 -1 20/30 -3         Tonometry (Tonopen, 2:30 PM)       Right Left   Pressure 11 11         Pupils       Dark Light Shape React APD   Right 4 3 Round Brisk None   Left 4 3 Round Brisk None         Visual Fields       Left Right    Full Full         Extraocular Movement       Right Left    Full Full         Neuro/Psych     Oriented x3: Yes   Mood/Affect: Normal         Dilation     Both eyes:            Slit Lamp and Fundus Exam     External Exam       Right Left   External Normal Normal         Slit Lamp Exam       Right  Left   Lids/Lashes Normal Normal   Conjunctiva/Sclera White and quiet White and quiet   Cornea Clear Clear   Anterior Chamber Deep and quiet Deep and quiet   Iris Round and reactive Round and reactive   Lens Centered posterior chamber intraocular lens, Open posterior capsule 2+ Nuclear sclerosis   Anterior Vitreous Normal Normal         Fundus Exam       Right Left   Posterior Vitreous Central vitreous floaters Posterior vitreous detachment, Central vitreous floaters   Disc Normal Normal   C/D Ratio 0.2 0.15   Macula less Macular thickening, Smaller Subretinal neovascular membrane, Retinal pigment epithelial mottling and hyperpigmentation subfoveal, Early age related macular degeneration, 78D exam Early age related macular degeneration, Hard drusen   Vessels Normal Normal   Periphery Normal Normal            IMAGING AND PROCEDURES  Imaging and Procedures for 05/14/21  OCT, Retina - OU - Both Eyes       Right Eye Quality was good. Scan locations included subfoveal. Central Foveal Thickness: 370. Progression has worsened. Findings include vitreous traction, no IRF, abnormal foveal contour, pigment epithelial detachment.   Left Eye Quality was good. Scan locations included subfoveal. Central Foveal Thickness: 272. Progression has been stable. Findings include retinal drusen .   Notes OD with vascularized PED with much less subretinal fluid today at follow-up interval of 6 weeks.  Apparent posterior vitreous detachment now  OS with incidental posterior vitreous detachment     Color Fundus Photography Optos - OU - Both Eyes       Right Eye Progression has no prior data. Disc findings include normal observations. Macula : drusen. Vessels : normal observations. Periphery : normal observations.   Left Eye Progression has no prior data. Disc findings include normal observations. Macula : drusen. Vessels : normal observations. Periphery : normal observations.    Notes OD with macular thickening subretinal fluid.  Will need repeat injection today     Intravitreal Injection, Pharmacologic Agent - OD - Right Eye       Time Out 05/14/2021. 3:10 PM. Confirmed correct patient, procedure, site, and patient consented.  Anesthesia Topical anesthesia was used. Anesthetic medications included Lidocaine 4%.   Procedure Preparation included Ofloxacin , Tobramycin 0.3%, 10% betadine to eyelids, 5% betadine to ocular surface. A 30 gauge needle was used.   Injection: 2.5 mg bevacizumab 2.5 MG/0.1ML   Route: Intravitreal, Site: Right Eye   NDC: [phone], Lot: [id number]   Post-op Post injection exam found visual acuity of at least counting fingers. The patient tolerated the procedure well. There were no complications. The patient received written and verbal post procedure care education. Post injection medications included ocuflox.              ASSESSMENT/PLAN:  Nuclear sclerotic cataract of left eye The nature of cataract was discussed with the patient as well as the elective nature of surgery. The patient was reassured that surgery at a later date does not put the patient at risk for a worse outcome. It was emphasized that the need for surgery is dictated by the patient's quality of life as influenced by the cataract. Patient was instructed to maintain close follow up with their general eye care doctor.  OS follow-up with Dr. 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 Exudative age-related macular degeneration of right eye with active choroidal  neovascularization (HCC)  H35.3211 OCT, Retina - OU - Both Eyes    Color Fundus Photography Optos - OU - Both Eyes    Intravitreal Injection, Pharmacologic Agent - OD - Right Eye    bevacizumab (AVASTIN) SOSY 2.5 mg    2. Nuclear sclerotic cataract of left eye  H25.12     3. 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 Intermediate stage nonexudative age-related macular degeneration of both eyes  H35.3132       1.  OD, still active serous retinal detachment associated with CNVM at 6-week interval.  Repeat injection today to maintain good acuity  2.  OS no sign of CNVM  3.  OS, cataract progression follow-up Dr. Dione BoozeGroat as scheduled  Ophthalmic Meds Ordered this visit:  Meds ordered this encounter  Medications   bevacizumab (AVASTIN) SOSY 2.5 mg       Return in about 6 weeks (around 06/25/2021) for dilate, OD, AVASTIN OCT.  There are no Patient Instructions on file for this visit.   Explained the diagnoses, plan, and follow up with the patient and they expressed understanding.  Patient expressed understanding of the importance of proper follow up care.   Alford HighlandGary A. Tyeson Tanimoto M.D. Diseases & Surgery of the Retina and Vitreous [facility] 05/14/21     Abbreviations: M myopia (nearsighted); A astigmatism; H hyperopia (farsighted); P presbyopia; Mrx spectacle prescription;  CTL contact lenses; OD right eye; OS left eye; OU both eyes  XT exotropia; ET esotropia; PEK punctate epithelial keratitis; PEE punctate epithelial erosions; DES dry eye syndrome; MGD meibomian gland dysfunction; ATs artificial tears; PFAT's preservative free artificial tears; [place] nuclear sclerotic cataract; PSC posterior subcapsular cataract; ERM epi-retinal membrane; PVD posterior vitreous detachment; RD retinal detachment; DM diabetes mellitus; DR diabetic retinopathy; NPDR non-proliferative diabetic retinopathy; PDR proliferative diabetic retinopathy; CSME  clinically  significant macular edema; DME diabetic macular edema; dbh dot blot hemorrhages; CWS cotton wool spot; POAG primary open angle glaucoma; C/D cup-to-disc ratio; HVF humphrey visual field; GVF goldmann visual field; OCT optical coherence tomography; IOP intraocular pressure; BRVO Branch retinal vein occlusion; CRVO central retinal vein occlusion; CRAO central retinal artery occlusion; BRAO branch retinal artery occlusion; RT retinal tear; SB scleral buckle; PPV pars plana vitrectomy; VH Vitreous hemorrhage; PRP panretinal laser photocoagulation; IVK intravitreal kenalog; VMT vitreomacular traction; MH Macular hole;  NVD neovascularization of the disc; NVE neovascularization elsewhere; AREDS age related eye disease study; ARMD age related macular degeneration; POAG primary open angle glaucoma; EBMD epithelial/anterior basement membrane dystrophy; ACIOL anterior chamber intraocular lens; IOL intraocular lens; PCIOL posterior chamber intraocular lens; Phaco/IOL phacoemulsification with intraocular lens placement; PRK photorefractive keratectomy; LASIK laser assisted in situ keratomileusis; HTN hypertension; DM diabetes mellitus; COPD chronic obstructive pulmonary disease

## 2021-05-14 NOTE — Assessment & Plan Note (Signed)
Still active, currently at this interval today of 6 weeks, repeat injection Avastin today follow-up in 6 weeks ?

## 2021-05-14 NOTE — Assessment & Plan Note (Signed)
No sign of CNVM OS today 

## 2021-05-14 NOTE — Assessment & Plan Note (Addendum)
Component of wet AMD, stable ?

## 2021-05-20 ENCOUNTER — Ambulatory Visit: Payer: Medicare Other | Admitting: Neurology

## 2021-05-22 ENCOUNTER — Ambulatory Visit (INDEPENDENT_AMBULATORY_CARE_PROVIDER_SITE_OTHER): Payer: Medicare Other | Admitting: Ophthalmology

## 2021-05-22 ENCOUNTER — Other Ambulatory Visit: Payer: Self-pay

## 2021-05-22 DIAGNOSIS — H353211 Exudative age-related macular degeneration, right eye, with active choroidal neovascularization: Secondary | ICD-10-CM | POA: Diagnosis not present

## 2021-05-22 NOTE — Progress Notes (Signed)
? ? ?05/22/2021 ? ?  ? ?CHIEF COMPLAINT ?Patient presents for  ?Chief Complaint  ?Patient presents with  ? Macular Degeneration  ? ? ? ? ?HISTORY OF PRESENT ILLNESS: ?[name] is a 81 y.o. female who presents to the clinic today for:  ? ?HPI   ?S/P INJECTION OD EYE RED AND BLURRY OCT, NO DILATION. ?1 post injection (05/14/21) on OD.  ?Pt states cannot see when driving.  ?Pt states symptoms started rising on 05/16/2021. ?Pt denies floaters and FOL. Sensitivity to light. ? ? ?Last edited by Angeline SlimMa, San on 05/22/2021  3:07 PM.  ?  ? ? ?Referring physician: ?Arnette FeltsMoore, Janece, FNP ?[address] ?[address] ?[place]  [place] [id number] ? ?HISTORICAL INFORMATION:  ? ?Selected notes from the MEDICAL RECORD NUMBER ?  ? ?Lab Results  ?Component Value Date  ? HGBA1C 5.5 10/27/2019  ?  ? ?CURRENT MEDICATIONS: ?Current Outpatient Medications (Ophthalmic Drugs)  ?Medication Sig  ? Polyethyl Glycol-Propyl Glycol (SYSTANE OP) Apply to eye. As needed  ? ?No current facility-administered medications for this visit. (Ophthalmic Drugs)  ? ?Current Outpatient Medications (Other)  ?Medication Sig  ? acetaminophen (TYLENOL) 500 MG tablet Take 1,000 mg by mouth every 6 (six) hours as needed.  ? cetirizine (ZYRTEC) 10 MG tablet Take 10 mg by mouth daily.  ? Cholecalciferol (VITAMIN D) 2000 units tablet Take 2,000 Units by mouth daily.  ? clopidogrel (PLAVIX) 75 MG tablet TAKE 1 TABLET BY MOUTH EVERY DAY  ? fluticasone (FLONASE) 50 MCG/ACT nasal spray Place into both nostrils daily as needed for allergies or rhinitis.  ? pregabalin (LYRICA) 25 MG capsule Take 1 capsule 3 times daily  ? sertraline (ZOLOFT) 50 MG tablet TAKE 1 TABLET BY MOUTH EVERY DAY  ? topiramate (TOPAMAX) 25 MG tablet Take one tablet in the morning and 3 in the evening  ? vitamin C (ASCORBIC ACID) 250 MG tablet Take 250 mg by mouth daily.   ? ?No current facility-administered medications for this visit. (Other)  ? ? ? ? ?REVIEW OF SYSTEMS: ?ROS   ?Negative for: Constitutional,  Gastrointestinal, Neurological, Skin, Genitourinary, Musculoskeletal, HENT, Endocrine, Cardiovascular, Eyes, Respiratory, Psychiatric, Allergic/Imm, Heme/Lymph ?Last edited by Angeline SlimMa, San on 05/22/2021  3:06 PM.  ?  ? ? ? ?ALLERGIES ?Allergies  ?Allergen Reactions  ? Aspirin Anaphylaxis and Hives  ? Elavil [Amitriptyline] Anaphylaxis  ? Latex   ? Relafen [Nabumetone] Hives  ? ? ?PAST MEDICAL HISTORY ?Past Medical History:  ?Diagnosis Date  ? Abnormality of gait 04/10/2015  ? Cervical spondylosis without myelopathy 07/18/2013  ? Common migraine with intractable migraine 04/10/2015  ? DJD (degenerative joint disease)   ? Fibromyalgia   ? Hiatal hernia   ? Migraine headache   ? Vitreomacular adhesion of right eye 03/08/2020  ? ?Past Surgical History:  ?Procedure Laterality Date  ? ABDOMINAL HYSTERECTOMY    ? DILATION AND CURETTAGE OF UTERUS    ? HIATAL HERNIA REPAIR    ? TONSILLECTOMY    ? ? ?FAMILY HISTORY ?Family History  ?Problem Relation Age of Onset  ? CVA Mother   ? Stroke Father   ? Emphysema Father   ? ? ?SOCIAL HISTORY ?Social History  ? ?Tobacco Use  ? Smoking status: Never  ? Smokeless tobacco: Never  ?Vaping Use  ? Vaping Use: Never used  ?Substance Use Topics  ? Alcohol use: No  ?  Comment: very occasional  ? Drug use: No  ? ?  ? ?  ? ?OPHTHALMIC EXAM: ? ?Base  Eye Exam   ? ? Visual Acuity (ETDRS)   ? ?   Right Left  ? Dist [place] 20/40 +2 20/30 -1  ? Dist ph [place] 20/25 -1 NI  ? ?  ?  ? ? Tonometry (Tonopen, 3:16 PM)   ? ?   Right Left  ? Pressure 6 6  ? ?  ?  ? ? Pupils   ? ?   Dark Light Shape React APD  ? Right 3 2 Round Brisk None  ? Left 3 2 Round Brisk None  ? ?  ?  ? ? Visual Fields   ? ?   Left Right  ?  Full Full  ? ?  ?  ? ? Extraocular Movement   ? ?   Right Left  ?  Full Full  ? ?  ?  ? ? Neuro/Psych   ? ? Oriented x3: Yes  ? Mood/Affect: Normal  ? ?  ?  ? ? Dilation   ? ? @ 3:16 PM  ? ?  ?  ? ?  ? ?Slit Lamp and Fundus Exam   ? ? External Exam   ? ?   Right Left  ? External Normal Normal  ? ?  ?  ? ? Slit  Lamp Exam   ? ?   Right Left  ? Lids/Lashes Normal Normal  ? Conjunctiva/Sclera Trace Injection, near site of injection no abnormality White and quiet  ? Cornea Clear Clear  ? Anterior Chamber Deep and quiet Deep and quiet  ? Iris Round and reactive Round and reactive  ? Lens Centered posterior chamber intraocular lens, Open posterior capsule 2+ Nuclear sclerosis  ? Anterior Vitreous Normal Normal  ? ?  ?  ? ?  ? ? ?IMAGING AND PROCEDURES  ?Imaging and Procedures for 05/22/21 ? ?OCT, Retina - OU - Both Eyes   ? ?   ?Right Eye ?Quality was good. Scan locations included subfoveal. Central Foveal Thickness: 307. Progression has worsened. Findings include vitreous traction, no IRF, abnormal foveal contour, pigment epithelial detachment.  ? ?Left Eye ?Quality was good. Scan locations included subfoveal. Central Foveal Thickness: 270. Progression has been stable. Findings include retinal drusen .  ? ?Notes ?OD with vascularized PED with much less subretinal fluid today at follow-up interval of 1 weeks. ? Apparent posterior vitreous detachment now ? ?OS with incidental posterior vitreous detachment ? ?  ? ? ?  ?  ? ?  ?ASSESSMENT/PLAN: ? ?Exudative age-related macular degeneration of right eye with active choroidal neovascularization (HCC) ?OD with less subretinal fluid some 1 week post most recent injection. ? ?Injection site with trace injection typical for small subconjunctival hemorrhage not pathologic ? ?No signs of intraocular inflammation, no visual loss no change  ? ?  ICD-10-CM   ?1. Exudative age-related macular degeneration of right eye with active choroidal neovascularization (HCC)  H35.3211 OCT, Retina - OU - Both Eyes  ?  ? ? ?1.  OD no signs of infection or or abnormal illogic reaction to recent injection.  In fact less subretinal fluid centrally and stable acuity no signs of intraocular inflammation. ? ?2.  Patient and family reassured there is no pathology here ? ?3. ? ?Ophthalmic Meds Ordered this visit:   ?No orders of the defined types were placed in this encounter. ? ? ?  ? ?Return for As scheduled, dilate, OD, AVASTIN OCT. ? ?There are no Patient Instructions on file for this visit. ? ? ?Explained the diagnoses, plan,  and follow up with the patient and they expressed understanding.  Patient expressed understanding of the importance of proper follow up care.  ? ?Alford Highland. Mashell Sieben M.D. ?Diseases & Surgery of the Retina and Vitreous ?[facility] ?05/22/21 ? ? ? ? ?Abbreviations: ?M myopia (nearsighted); A astigmatism; H hyperopia (farsighted); P presbyopia; Mrx spectacle prescription;  CTL contact lenses; OD right eye; OS left eye; OU both eyes  XT exotropia; ET esotropia; PEK punctate epithelial keratitis; PEE punctate epithelial erosions; DES dry eye syndrome; MGD meibomian gland dysfunction; ATs artificial tears; PFAT's preservative free artificial tears; NSC nuclear sclerotic cataract; PSC posterior subcapsular cataract; ERM epi-retinal membrane; PVD posterior vitreous detachment; RD retinal detachment; DM diabetes mellitus; DR diabetic retinopathy; NPDR non-proliferative diabetic retinopathy; PDR proliferative diabetic retinopathy; CSME clinically significant macular edema; DME diabetic macular edema; dbh dot blot hemorrhages; CWS cotton wool spot; POAG primary open angle glaucoma; C/D cup-to-disc ratio; HVF humphrey visual field; GVF goldmann visual field; OCT optical coherence tomography; IOP intraocular pressure; BRVO Branch retinal vein occlusion; CRVO central retinal vein occlusion; CRAO central retinal artery occlusion; BRAO branch retinal artery occlusion; RT retinal tear; SB scleral buckle; PPV pars plana vitrectomy; VH Vitreous hemorrhage; PRP panretinal laser photocoagulation; IVK intravitreal kenalog; VMT vitreomacular traction; MH Macular hole;  NVD neovascularization of the disc; NVE neovascularization elsewhere; AREDS age related eye disease study; ARMD age related macular  degeneration; POAG primary open angle glaucoma; EBMD epithelial/anterior basement membrane dystrophy; ACIOL anterior chamber intraocular lens; IOL intraocular lens; PCIOL posterior chamber intraocular lens; Phaco/IO

## 2021-05-22 NOTE — Assessment & Plan Note (Signed)
OD with less subretinal fluid some 1 week post most recent injection. ? ?Injection site with trace injection typical for small subconjunctival hemorrhage not pathologic ? ?No signs of intraocular inflammation, no visual loss no change ?

## 2021-05-24 ENCOUNTER — Other Ambulatory Visit: Payer: Self-pay | Admitting: Nurse Practitioner

## 2021-05-24 DIAGNOSIS — F3341 Major depressive disorder, recurrent, in partial remission: Secondary | ICD-10-CM

## 2021-06-05 ENCOUNTER — Telehealth: Payer: Self-pay

## 2021-06-05 ENCOUNTER — Telehealth: Payer: Medicare Other

## 2021-06-05 NOTE — Telephone Encounter (Signed)
?  Care Management  ? ?Follow Up Note ? ? ?06/05/2021 ?Name: [name] MRN: [id number] DOB: [birth year] ? ? ?Referred by: [name], FNP ?Reason for referral : Chronic Care Management (RN CM follow up call ) ? ? ?An unsuccessful telephone outreach was attempted today. The patient was referred to the case management team for assistance with care management and care coordination.  ? ?Follow Up Plan: Telephone follow up appointment with care management team member scheduled for: 08/02/21 ? ?Barb Merino, RN, BSN, CCM ?Care Management Coordinator ?[facility]  ?Direct Phone: [phone] ? ? ?

## 2021-06-07 ENCOUNTER — Other Ambulatory Visit: Payer: Self-pay | Admitting: Neurology

## 2021-06-10 NOTE — Telephone Encounter (Signed)
Rx refilled.

## 2021-06-25 ENCOUNTER — Ambulatory Visit (INDEPENDENT_AMBULATORY_CARE_PROVIDER_SITE_OTHER): Payer: Medicare Other | Admitting: Ophthalmology

## 2021-06-25 ENCOUNTER — Encounter (INDEPENDENT_AMBULATORY_CARE_PROVIDER_SITE_OTHER): Payer: Medicare Other | Admitting: Ophthalmology

## 2021-06-25 ENCOUNTER — Encounter (INDEPENDENT_AMBULATORY_CARE_PROVIDER_SITE_OTHER): Payer: Self-pay | Admitting: Ophthalmology

## 2021-06-25 DIAGNOSIS — H35721 Serous detachment of retinal pigment epithelium, right eye: Secondary | ICD-10-CM | POA: Diagnosis not present

## 2021-06-25 DIAGNOSIS — H353132 Nonexudative age-related macular degeneration, bilateral, intermediate dry stage: Secondary | ICD-10-CM | POA: Diagnosis not present

## 2021-06-25 DIAGNOSIS — H353211 Exudative age-related macular degeneration, right eye, with active choroidal neovascularization: Secondary | ICD-10-CM

## 2021-06-25 MED ORDER — BEVACIZUMAB 2.5 MG/0.1ML IZ SOSY
2.5000 mg | PREFILLED_SYRINGE | INTRAVITREAL | Status: AC | PRN
  Administered 2021-06-25: 2.5 mg via INTRAVITREAL

## 2021-06-25 NOTE — Assessment & Plan Note (Signed)
New subretinal fluid as of August 2021, which had diminished in size and extent but today at 6-week follow-up interval post Avastin, increase in size of fluid. ? ?Repeat injection OD today to maintain good acuity and hopefully decrease subretinal fluid. ? ?Patient and family encouraged to join "good days" charity organization should be have the option to switch medications OD ?

## 2021-06-25 NOTE — Progress Notes (Signed)
? ? ?06/25/2021 ? ?  ? ?CHIEF COMPLAINT ?Patient presents for  ?Chief Complaint  ?Patient presents with  ? Macular Degeneration  ? ? ? ? ?HISTORY OF PRESENT ILLNESS: ?[name] is a 81 y.o. female who presents to the clinic today for:  ? ?HPI   ?6 weeks for DILATE, OD OCT. ?PT stated no changes in vision. ?Pt denies floaters and FOL. ? ? ?Last edited by Angeline Slim on 06/25/2021  2:49 PM.  ?  ? ? ?Referring physician: ?Sallye Lat, MD ?[address] ?[address] ?[place],  [place] [id number] ? ?HISTORICAL INFORMATION:  ? ?Selected notes from the MEDICAL RECORD NUMBER ?  ? ?Lab Results  ?Component Value Date  ? HGBA1C 5.5 10/27/2019  ?  ? ?CURRENT MEDICATIONS: ?Current Outpatient Medications (Ophthalmic Drugs)  ?Medication Sig  ? Polyethyl Glycol-Propyl Glycol (SYSTANE OP) Apply to eye. As needed  ? ?No current facility-administered medications for this visit. (Ophthalmic Drugs)  ? ?Current Outpatient Medications (Other)  ?Medication Sig  ? acetaminophen (TYLENOL) 500 MG tablet Take 1,000 mg by mouth every 6 (six) hours as needed.  ? cetirizine (ZYRTEC) 10 MG tablet Take 10 mg by mouth daily.  ? Cholecalciferol (VITAMIN D) 2000 units tablet Take 2,000 Units by mouth daily.  ? clopidogrel (PLAVIX) 75 MG tablet TAKE 1 TABLET BY MOUTH EVERY DAY  ? fluticasone (FLONASE) 50 MCG/ACT nasal spray Place into both nostrils daily as needed for allergies or rhinitis.  ? pregabalin (LYRICA) 25 MG capsule Take 1 capsule 3 times daily  ? sertraline (ZOLOFT) 50 MG tablet TAKE 1 TABLET BY MOUTH EVERY DAY  ? topiramate (TOPAMAX) 25 MG tablet TAKE ONE TABLET IN THE MORNING AND 3 IN THE EVENING  ? vitamin C (ASCORBIC ACID) 250 MG tablet Take 250 mg by mouth daily.   ? ?No current facility-administered medications for this visit. (Other)  ? ? ? ? ?REVIEW OF SYSTEMS: ?ROS   ?Negative for: Constitutional, Gastrointestinal, Neurological, Skin, Genitourinary, Musculoskeletal, HENT, Endocrine, Cardiovascular, Eyes, Respiratory, Psychiatric,  Allergic/Imm, Heme/Lymph ?Last edited by Angeline Slim on 06/25/2021  2:49 PM.  ?  ? ? ? ?ALLERGIES ?Allergies  ?Allergen Reactions  ? Aspirin Anaphylaxis and Hives  ? Elavil [Amitriptyline] Anaphylaxis  ? Latex   ? Relafen [Nabumetone] Hives  ? ? ?PAST MEDICAL HISTORY ?Past Medical History:  ?Diagnosis Date  ? Abnormality of gait 04/10/2015  ? Cervical spondylosis without myelopathy 07/18/2013  ? Common migraine with intractable migraine 04/10/2015  ? DJD (degenerative joint disease)   ? Fibromyalgia   ? Hiatal hernia   ? Migraine headache   ? Vitreomacular adhesion of right eye 03/08/2020  ? ?Past Surgical History:  ?Procedure Laterality Date  ? ABDOMINAL HYSTERECTOMY    ? DILATION AND CURETTAGE OF UTERUS    ? HIATAL HERNIA REPAIR    ? TONSILLECTOMY    ? ? ?FAMILY HISTORY ?Family History  ?Problem Relation Age of Onset  ? CVA Mother   ? Stroke Father   ? Emphysema Father   ? ? ?SOCIAL HISTORY ?Social History  ? ?Tobacco Use  ? Smoking status: Never  ? Smokeless tobacco: Never  ?Vaping Use  ? Vaping Use: Never used  ?Substance Use Topics  ? Alcohol use: No  ?  Comment: very occasional  ? Drug use: No  ? ?  ? ?  ? ?OPHTHALMIC EXAM: ? ?Base Eye Exam   ? ? Visual Acuity (ETDRS)   ? ?   Right Left  ? Dist [place]  20/40 -1   ? Dist ph [place] 20/30 -2 20/30 -2 +2  ? ?  ?  ? ? Tonometry (Tonopen, 3:00 PM)   ? ?   Right Left  ? Pressure 10 9  ? ?  ?  ? ? Pupils   ? ?   Pupils APD  ? Right PERRL None  ? Left PERRL None  ? ?  ?  ? ? Visual Fields   ? ?   Left Right  ?  Full Full  ? ?  ?  ? ? Extraocular Movement   ? ?   Right Left  ?  Full Full  ? ?  ?  ? ? Neuro/Psych   ? ? Oriented x3: Yes  ? Mood/Affect: Normal  ? ?  ?  ? ? Dilation   ? ? Right eye: 2.5% Phenylephrine, 1.0% Mydriacyl @ 3:00 PM  ? ?  ?  ? ?  ? ?Slit Lamp and Fundus Exam   ? ? External Exam   ? ?   Right Left  ? External Normal Normal  ? ?  ?  ? ? Slit Lamp Exam   ? ?   Right Left  ? Lids/Lashes Normal Normal  ? Conjunctiva/Sclera White and quiet White and quiet  ? Cornea  Clear Clear  ? Anterior Chamber Deep and quiet Deep and quiet  ? Iris Round and reactive Round and reactive  ? Lens Centered posterior chamber intraocular lens, Open posterior capsule 2+ Nuclear sclerosis  ? Anterior Vitreous Normal Normal  ? ?  ?  ? ? Fundus Exam   ? ?   Right Left  ? Posterior Vitreous Central vitreous floaters   ? Disc Normal   ? C/D Ratio 0.2   ? Macula Macular thickening, Smaller Subretinal neovascular membrane, Retinal pigment epithelial mottling and hyperpigmentation subfoveal, Early age related macular degeneration, 79D exam   ? Vessels Normal   ? Periphery Normal   ? ?  ?  ? ?  ? ? ?IMAGING AND PROCEDURES  ?Imaging and Procedures for 06/25/21 ? ?OCT, Retina - OU - Both Eyes   ? ?   ?Right Eye ?Quality was good. Scan locations included subfoveal. Central Foveal Thickness: 327. Progression has worsened. Findings include vitreous traction, no IRF, abnormal foveal contour, pigment epithelial detachment.  ? ?Left Eye ?Quality was good. Scan locations included subfoveal. Central Foveal Thickness: 269. Progression has been stable. Findings include retinal drusen .  ? ?Notes ?OD with vascularized PED with much less subretinal fluid today at follow-up interval of 6 weeks.  Post use of Avastin ? Apparent posterior vitreous detachment now ? ?OS with incidental posterior vitreous detachment ? ?  ? ?Intravitreal Injection, Pharmacologic Agent - OD - Right Eye   ? ?   ?Time Out ?06/25/2021. 3:23 PM. Confirmed correct patient, procedure, site, and patient consented.  ? ?Anesthesia ?Topical anesthesia was used. Anesthetic medications included Lidocaine 4%.  ? ?Procedure ?Preparation included Ofloxacin , Tobramycin 0.3%, 10% betadine to eyelids, 5% betadine to ocular surface. A 30 gauge needle was used.  ? ?Injection: ?2.5 mg bevacizumab 2.5 MG/0.1ML ?  Route: Intravitreal, Site: Right Eye ?  NDC: 13244-010-2771449-091-43, Lot: [id number]  ? ?Post-op ?Post injection exam found visual acuity of at least counting fingers.  The patient tolerated the procedure well. There were no complications. The patient received written and verbal post procedure care education. Post injection medications included ocuflox.  ? ?  ? ? ?  ?  ? ?  ?  ASSESSMENT/PLAN: ? ?Intermediate stage nonexudative age-related macular degeneration of both eyes ?No sign of CNVM formation OS ? ?Exudative age-related macular degeneration of right eye with active choroidal neovascularization (HCC) ?New subretinal fluid as of August 2021, which had diminished in size and extent but today at 6-week follow-up interval post Avastin, increase in size of fluid. ? ?Repeat injection OD today to maintain good acuity and hopefully decrease subretinal fluid. ? ?Patient and family encouraged to join "good days" charity organization should be have the option to switch medications OD ? 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  Repeat injection today follow-up in a month ? ?May need to apply for good days and potentially change to [facility] OD next  ? ?  ICD-10-CM   ?1. Exudative age-related macular degeneration of right eye with active choroidal neovascularization (HCC)  H35.3211 OCT, Retina - OU - Both Eyes  ?  Intravitreal Injection, Pharmacologic Agent - OD - Right Eye  ?  bevacizumab (AVASTIN) SOSY 2.5 mg  ?  ?2. Intermediate stage nonexudative age-related macular degeneration of both eyes  H35.3132   ?  ?3. 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  ? ? ?1.  OD, increased subretinal fluid at 6-week follow-up interval today.  Repeat Avastin today to maintain good acuity and shorten interval follow-up examination next to 1 month ? ?2.  Consider change to [facility] OD if patient qualifies for good days and except ?3. ? ?Ophthalmic Meds Ordered this visit:  ?Meds ordered this encounter  ?Medications  ? bevacizumab (AVASTIN) SOSY 2.5 mg  ? ? ?  ? ?Return in about 1 month (around 07/25/2021) for DILATE OU,  EYLEA OCT, OD,, this is a change. ? ?There are no Patient Instructions on file for this visit. ? ? ?Explained the diagnoses, plan, and follow up with the patient and they expressed understanding.  Patient expres

## 2021-06-25 NOTE — Assessment & Plan Note (Signed)
Increased size today, at 6 weeks with most recent Avastin.  Repeat injection today follow-up in a month ? ?May need to apply for good days and potentially change to [facility] OD next ?

## 2021-06-25 NOTE — Assessment & Plan Note (Signed)
No sign of CNVM formation OS ?

## 2021-07-29 ENCOUNTER — Ambulatory Visit (INDEPENDENT_AMBULATORY_CARE_PROVIDER_SITE_OTHER): Payer: Medicare Other | Admitting: Ophthalmology

## 2021-07-29 ENCOUNTER — Encounter (INDEPENDENT_AMBULATORY_CARE_PROVIDER_SITE_OTHER): Payer: Self-pay | Admitting: Ophthalmology

## 2021-07-29 DIAGNOSIS — H35721 Serous detachment of retinal pigment epithelium, right eye: Secondary | ICD-10-CM

## 2021-07-29 DIAGNOSIS — H353211 Exudative age-related macular degeneration, right eye, with active choroidal neovascularization: Secondary | ICD-10-CM

## 2021-07-29 DIAGNOSIS — H353132 Nonexudative age-related macular degeneration, bilateral, intermediate dry stage: Secondary | ICD-10-CM | POA: Diagnosis not present

## 2021-07-29 DIAGNOSIS — H2512 Age-related nuclear cataract, left eye: Secondary | ICD-10-CM | POA: Diagnosis not present

## 2021-07-29 MED ORDER — BEVACIZUMAB 2.5 MG/0.1ML IZ SOSY
2.5000 mg | PREFILLED_SYRINGE | INTRAVITREAL | Status: AC | PRN
  Administered 2021-07-29: 2.5 mg via INTRAVITREAL

## 2021-07-29 NOTE — Progress Notes (Signed)
07/29/2021     CHIEF COMPLAINT Patient presents for  Chief Complaint  Patient presents with   Macular Degeneration      HISTORY OF PRESENT ILLNESS: [name] is a 81 y.o. female who presents to the clinic today for:   HPI   1 month (4 weeks and 6 days) dilate OU, Avastin OD, OCT. Patient daughter has questions about patients macular degeneration. Patient states vision seems a little better, she can read road signs in the car when her daughter is driving a lot better now. Denies any new floaters or FOL.   Last edited by Nelva NayKronstein, Anna N on 07/29/2021  3:48 PM.      Referring physician: Arnette FeltsMoore, Janece, FNP [address] [place] [id number]  HISTORICAL INFORMATION:   Selected notes from the MEDICAL RECORD NUMBER    Lab Results  Component Value Date   HGBA1C 5.5 10/27/2019     CURRENT MEDICATIONS: Current Outpatient Medications (Ophthalmic Drugs)  Medication Sig   Polyethyl Glycol-Propyl Glycol (SYSTANE OP) Apply to eye. As needed   No current facility-administered medications for this visit. (Ophthalmic Drugs)   Current Outpatient Medications (Other)  Medication Sig   acetaminophen (TYLENOL) 500 MG tablet Take 1,000 mg by mouth every 6 (six) hours as needed.   cetirizine (ZYRTEC) 10 MG tablet Take 10 mg by mouth daily.   Cholecalciferol (VITAMIN D) 2000 units tablet Take 2,000 Units by mouth daily.   clopidogrel (PLAVIX) 75 MG tablet TAKE 1 TABLET BY MOUTH EVERY DAY   fluticasone (FLONASE) 50 MCG/ACT nasal spray Place into both nostrils daily as needed for allergies or rhinitis.   pregabalin (LYRICA) 25 MG capsule Take 1 capsule 3 times daily   sertraline (ZOLOFT) 50 MG tablet TAKE 1 TABLET BY MOUTH EVERY DAY   topiramate (TOPAMAX) 25 MG tablet TAKE ONE TABLET IN THE MORNING AND 3 IN THE EVENING   vitamin C (ASCORBIC ACID) 250 MG tablet Take 250 mg by mouth daily.    No current facility-administered medications for this visit. (Other)       REVIEW OF SYSTEMS: ROS   Negative for: Constitutional, Gastrointestinal, Neurological, Skin, Genitourinary, Musculoskeletal, HENT, Endocrine, Cardiovascular, Eyes, Respiratory, Psychiatric, Allergic/Imm, Heme/Lymph Last edited by Edmon Crapeankin, Elfriede Bonini A, MD on 07/29/2021  4:04 PM.       ALLERGIES Allergies  Allergen Reactions   Aspirin Anaphylaxis and Hives   Elavil [Amitriptyline] Anaphylaxis   Latex    Relafen [Nabumetone] Hives    PAST MEDICAL HISTORY Past Medical History:  Diagnosis Date   Abnormality of gait 04/10/2015   Cervical spondylosis without myelopathy 07/18/2013   Common migraine with intractable migraine 04/10/2015   DJD (degenerative joint disease)    Fibromyalgia    Hiatal hernia    Migraine headache    Vitreomacular adhesion of right eye 03/08/2020   Past Surgical History:  Procedure Laterality Date   ABDOMINAL HYSTERECTOMY     DILATION AND CURETTAGE OF UTERUS     HIATAL HERNIA REPAIR     TONSILLECTOMY      FAMILY HISTORY Family History  Problem Relation Age of Onset   CVA Mother    Stroke Father    Emphysema Father     SOCIAL HISTORY Social History   Tobacco Use   Smoking status: Never   Smokeless tobacco: Never  Vaping Use   Vaping Use: Never used  Substance Use Topics   Alcohol use: No    Comment: very occasional   Drug  use: No         OPHTHALMIC EXAM:  Base Eye Exam     Visual Acuity (ETDRS)       Right Left   Dist [place] 20/25 -2 20/25         Tonometry (Tonopen, 3:40 PM)       Right Left   Pressure 9 14         Pupils       Pupils Dark Light APD   Right PERRL 4 3 None   Left PERRL 4 3 None         Extraocular Movement       Right Left    Full Full         Neuro/Psych     Oriented x3: Yes   Mood/Affect: Normal         Dilation     Both eyes: 1.0% Mydriacyl, 2.5% Phenylephrine @ 3:40 PM           Slit Lamp and Fundus Exam     External Exam       Right Left   External Normal Normal          Slit Lamp Exam       Right Left   Lids/Lashes Normal Normal   Conjunctiva/Sclera White and quiet White and quiet   Cornea Clear Clear   Anterior Chamber Deep and quiet Deep and quiet   Iris Round and reactive Round and reactive   Lens Centered posterior chamber intraocular lens, Open posterior capsule 2+ Nuclear sclerosis   Anterior Vitreous Normal Normal         Fundus Exam       Right Left   Posterior Vitreous Central vitreous floaters Posterior vitreous detachment, Central vitreous floaters   Disc Normal Normal   C/D Ratio 0.2 0.15   Macula Macular thickening, Smaller Subretinal neovascular membrane, Retinal pigment epithelial mottling and hyperpigmentation subfoveal, Early age related macular degeneration, 79D exam Early age related macular degeneration, Hard drusen   Vessels Normal Normal   Periphery Normal Normal            IMAGING AND PROCEDURES  Imaging and Procedures for 07/29/21  OCT, Retina - OU - Both Eyes       Right Eye Quality was good. Scan locations included subfoveal. Central Foveal Thickness: 327. Progression has worsened. Findings include vitreous traction, no IRF, abnormal foveal contour, pigment epithelial detachment.   Left Eye Quality was good. Scan locations included subfoveal. Central Foveal Thickness: 261. Progression has been stable. Findings include retinal drusen .   Notes OD with vascularized PED with much less subretinal fluid today at follow-up interval of 5 weeks.  Post use of Avastin with less subretinal fluid  Apparent posterior vitreous detachment now  OS with incidental posterior vitreous detachment     Intravitreal Injection, Pharmacologic Agent - OD - Right Eye       Time Out 07/29/2021. 4:07 PM. Confirmed correct patient, procedure, site, and patient consented.   Anesthesia Topical anesthesia was used. Anesthetic medications included Lidocaine 4%.   Procedure Preparation included Ofloxacin , Tobramycin  0.3%, 10% betadine to eyelids, 5% betadine to ocular surface. A 30 gauge needle was used.   Injection: 2.5 mg bevacizumab 2.5 MG/0.1ML   Route: Intravitreal, Site: Right Eye   NDC: [phone], Lot: [id number], Expiration date: 09/12/2021   Post-op Post injection exam found visual acuity of at least counting fingers. The patient tolerated the procedure well. There were no complications. The  patient received written and verbal post procedure care education. Post injection medications included ocuflox.              ASSESSMENT/PLAN:  Exudative age-related macular degeneration of right eye with active choroidal neovascularization (HCC) Vastly improved but chronically active subretinal fluid in the subfoveal location with retained good acuity at the right eye currently at 5-week interval post Avastin.  Less subretinal fluid today.  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  Stable at 5-week post Avastin.  Nuclear sclerotic cataract of left eye Moderate OS  Intermediate stage nonexudative age-related macular degeneration of both eyes No sign of CNVM OS     ICD-10-CM   1. Exudative age-related macular degeneration of right eye with active choroidal neovascularization (HCC)  H35.3211 OCT, Retina - OU - Both Eyes    Intravitreal Injection, Pharmacologic Agent - OD - Right Eye    bevacizumab (AVASTIN) SOSY 2.5 mg    2. 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 Nuclear sclerotic cataract of left eye  H25.12     4. Intermediate stage nonexudative age-related macular degeneration of both eyes  H35.3132       OD vastly improved but still active CNVM with serous retinal detachment in the fovea.  Currently at 5 weeks post injection Avastin with retention of excellent acuity OD.  We will repeat injection today and maintain 5-week evaluation OD  2.  OS no sign of  CNVM will continue to monitor  3.  Ophthalmic Meds Ordered this visit:  Meds ordered this encounter  Medications   bevacizumab (AVASTIN) SOSY 2.5 mg       Return in about 5 weeks (around 09/02/2021) for dilate, OD, AVASTIN OCT.  There are no Patient Instructions on file for this visit.   Explained the diagnoses, plan, and follow up with the patient and they expressed understanding.  Patient expressed understanding of the importance of proper follow up care.   Alford Highland Yousef Huge M.D. Diseases & Surgery of the Retina and Vitreous [facility] 07/29/21     Abbreviations: M myopia (nearsighted); A astigmatism; H hyperopia (farsighted); P presbyopia; Mrx spectacle prescription;  CTL contact lenses; OD right eye; OS left eye; OU both eyes  XT exotropia; ET esotropia; PEK punctate epithelial keratitis; PEE punctate epithelial erosions; DES dry eye syndrome; MGD meibomian gland dysfunction; ATs artificial tears; PFAT's preservative free artificial tears; NSC nuclear sclerotic cataract; PSC posterior subcapsular cataract; ERM epi-retinal membrane; PVD posterior vitreous detachment; RD retinal detachment; DM diabetes mellitus; DR diabetic retinopathy; NPDR non-proliferative diabetic retinopathy; PDR proliferative diabetic retinopathy; CSME clinically significant macular edema; DME diabetic macular edema; dbh dot blot hemorrhages; CWS cotton wool spot; POAG primary open angle glaucoma; C/D cup-to-disc ratio; HVF humphrey visual field; GVF goldmann visual field; OCT optical coherence tomography; IOP intraocular pressure; BRVO Branch retinal vein occlusion; CRVO central retinal vein occlusion; CRAO central retinal artery occlusion; BRAO branch retinal artery occlusion; RT retinal tear; SB scleral buckle; PPV pars plana vitrectomy; VH Vitreous hemorrhage; PRP panretinal laser photocoagulation; IVK intravitreal kenalog; VMT vitreomacular traction; MH Macular hole;  NVD neovascularization of  the disc; NVE neovascularization elsewhere; AREDS age related eye disease study; ARMD age related macular degeneration; POAG primary open angle glaucoma; EBMD epithelial/anterior basement membrane dystrophy; ACIOL anterior chamber intraocular lens; IOL intraocular lens; PCIOL posterior chamber intraocular lens; Phaco/IOL phacoemulsification with intraocular lens placement;  [place] photorefractive keratectomy; LASIK laser assisted in situ keratomileusis; HTN hypertension; DM diabetes mellitus; COPD chronic obstructive pulmonary disease

## 2021-07-29 NOTE — Assessment & Plan Note (Signed)
Moderate OS 

## 2021-07-29 NOTE — Assessment & Plan Note (Signed)
Vastly improved but chronically active subretinal fluid in the subfoveal location with retained good acuity at the right eye currently at 5-week interval post Avastin.  Less subretinal fluid today.  Repeat injection today reevaluate again in 5 weeks

## 2021-07-29 NOTE — Assessment & Plan Note (Signed)
Component of CNVM, with subretinal fluid.  Stable at 5-week post Avastin.

## 2021-07-29 NOTE — Assessment & Plan Note (Signed)
No sign of CNVM OS 

## 2021-07-30 NOTE — Progress Notes (Signed)
PATIENT: [name] DOB: Jun 21, 1940  REASON FOR VISIT: follow up for headaches  HISTORY FROM: patient, alone PRIMARY NEUROLOGIST: Dr. Dalia Heading. [place]  HISTORY OF PRESENT ILLNESS: Today 07/31/21  [name] is here today for follow-up. In August, admitted for severe sepsis with UTI E.Coli. CT head no acute problem, showed mild chronic SVD. Had flu like virus about 1 week ago with vomiting. Lives alone now. Does her own daily activities, drives a car, no falls. Uses quad cane. Remains on Topamax 25/75 mg daily, Lyrica 25 mg up to 3 times daily. Headaches triggered by loud noise, when she gets upset, allergies. Usually headaches are either right or left temple. Will lay down, take Tylenol and it helps.  Her right side aches from fibromyalgia chronically. Headaches are few times a month, overall feels under good control  Update 05/16/2020 SS: [name] is a 81 year old female with history of fibromyalgia and headaches.  She remains on Lyrica and Topamax.  Headaches well controlled, usually 2 a month, will take Tylenol and lay down with good benefit.  Has chronic right knee pain, low back pain.  Has good and bad days with fibromyalgia, worse with weather change.  Uses a quad cane, no falls of recent.  Her granddaughter lives with her.  She drives a car.  Indicates she does her own ADLs, housework, activities on her own.  Having CT head scan tomorrow, ordered by PCP, family was concerned with memory. She does not think necessary.  Her Zoloft has been increased.  Has been started on B12 injections.  Is on Plavix.  Here today for evaluation unaccompanied.  Update 05/18/2019 SS: [name] is a 81 year old female with history of fibromyalgia and headache.  She remains on Lyrica 25 mg 3 times a day.  She is also taking Topamax.  She takes Tylenol with good benefit.  Headaches are so infrequent, she does not even keep track of them.  She sleeps well.  She has chronic right knee pain, that is  bothersome.  She has low back pain, that may radiate down the right leg with her fibromyalgia.  Lyrica is helpful, some days, she may only take 25 mg twice a day, for bad days she will take 3 times a day.  She has not been as active as she usually is.  She has not had any falls.  She uses a quad cane.  She drives a car, her granddaughter lives with her.  She denies any new problems or concerns.  She presents today for evaluation unaccompanied.  HISTORY 11/16/2018 SS: [name] is a 81 year old female with history of fibromyalgia and headache.  She remains on Lyrica 25 mg 3 times a day and this is providing adequate management of her discomfort and pain.  She does report pain and swelling to her right knee for several months.  She says she will be going to see an orthopedic doctor when she returns from vacation.  She reports her headaches have improved with the increased dose of Topamax.  She remains on Topamax 25 mg in the morning, 75 mg at bedtime.  She reports on average she may have 4 headaches a month.  She will take Tylenol and lie down with headache relief.  She reports her granddaughter currently lives with her.  She is able to perform all of her daily activities independently.  She does drive a car without difficulty.  She is tolerating her medications well.  She presents today for follow-up unaccompanied.  REVIEW OF SYSTEMS: Out of a complete 14 system review of symptoms, the patient complains only of the following symptoms, and all other reviewed systems are negative.  See HPI  ALLERGIES: Allergies  Allergen Reactions   Aspirin Anaphylaxis and Hives   Elavil [Amitriptyline] Anaphylaxis   Latex    Relafen [Nabumetone] Hives   HOME MEDICATIONS: Outpatient Medications Prior to Visit  Medication Sig Dispense Refill   acetaminophen (TYLENOL) 500 MG tablet Take 1,000 mg by mouth every 6 (six) hours as needed.     cetirizine (ZYRTEC) 10 MG tablet Take 10 mg by mouth daily.      Cholecalciferol (VITAMIN D) 2000 units tablet Take 2,000 Units by mouth daily.     clopidogrel (PLAVIX) 75 MG tablet TAKE 1 TABLET BY MOUTH EVERY DAY 90 tablet 1   fluticasone (FLONASE) 50 MCG/ACT nasal spray Place into both nostrils daily as needed for allergies or rhinitis.     Polyethyl Glycol-Propyl Glycol (SYSTANE OP) Apply to eye. As needed     pregabalin (LYRICA) 25 MG capsule Take 1 capsule 3 times daily 270 capsule 1   sertraline (ZOLOFT) 50 MG tablet TAKE 1 TABLET BY MOUTH EVERY DAY 90 tablet 1   topiramate (TOPAMAX) 25 MG tablet TAKE ONE TABLET IN THE MORNING AND 3 IN THE EVENING 360 tablet 3   vitamin C (ASCORBIC ACID) 250 MG tablet Take 250 mg by mouth daily.      No facility-administered medications prior to visit.    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/08/2020    PAST SURGICAL HISTORY: Past Surgical History:  Procedure Laterality Date   ABDOMINAL HYSTERECTOMY     DILATION AND CURETTAGE OF UTERUS     HIATAL HERNIA REPAIR     TONSILLECTOMY      FAMILY HISTORY: Family History  Problem Relation Age of Onset   CVA Mother    Stroke Father    Emphysema Father     SOCIAL HISTORY: Social History   Socioeconomic History   Marital status: Widowed    Spouse name: Not on file   Number of children: 5   Years of education: college   Highest education level: Not on file  Occupational History   Occupation: Retired  Tobacco Use   Smoking status: Never   Smokeless tobacco: Never  Vaping Use   Vaping Use: Never used  Substance and Sexual Activity   Alcohol use: No    Comment: very occasional   Drug use: No   Sexual activity: Not Currently  Other Topics Concern   Not on  file  Social History Narrative   Patient is married with 5 children.   Patient is right handed.   Patient has college education.   Patient drinks 3 cups daily.         Social Determinants of Health   Financial Resource Strain: Low Risk    Difficulty of Paying Living Expenses: Not hard at all  Food Insecurity: Food Insecurity Present   Worried About Programme researcher, broadcasting/film/video in the Last Year: Sometimes true   Barista in the Last Year: Sometimes true  Transportation Needs: No Transportation Needs   Lack of Transportation (Medical):  No   Lack of Transportation (Non-Medical): No  Physical Activity: Inactive   Days of Exercise per Week: 0 days   Minutes of Exercise per Session: 0 min  Stress: No Stress Concern Present   Feeling of Stress : Not at all  Social Connections: Not on file  Intimate Partner Violence: Not on file   PHYSICAL EXAM  Vitals:   07/31/21 1308  BP: (!) 96/56  Pulse: 74  Weight: 156 lb (70.8 kg)  Height: 5' 4.5" (1.638 m)    Body mass index is 26.36 kg/m.  Generalized: Well developed, in no acute distress   Neurological examination  Mentation: Alert oriented to time, place, history taking. Follows all commands speech and language fluent, very pleasant Cranial nerve II-XII: Pupils were equal round reactive to light. Extraocular movements were full, visual field were full on confrontational test. Facial sensation and strength were normal. Head turning and shoulder shrug were normal and symmetric. Motor: Good strength of all extremities, mild right hip flexion weakness Sensory: Sensory testing is intact to soft touch on all 4 extremities. No evidence of extinction is noted.  Coordination: Cerebellar testing reveals good finger-nose-finger and heel-to-shin bilaterally.  Gait and station: Able to rise from seated position without pushoff, gait is normal, steady, has a quad cane to use. Reflexes: Deep tendon reflexes are symmetric   DIAGNOSTIC DATA (LABS,  IMAGING, TESTING) - I reviewed patient records, labs, notes, testing and imaging myself where available.  Lab Results  Component Value Date   WBC 7.1 11/01/2020   HGB 12.7 11/01/2020   HCT 39.0 11/01/2020   MCV 91 11/01/2020   PLT 375 11/01/2020      Component Value Date/Time   NA 140 11/01/2020 1139   K 4.4 11/01/2020 1139   CL 102 11/01/2020 1139   CO2 24 11/01/2020 1139   GLUCOSE 92 11/01/2020 1139   GLUCOSE 104 (H) 10/24/2020 0021   BUN 14 11/01/2020 1139   CREATININE 0.77 11/01/2020 1139   CALCIUM 9.2 11/01/2020 1139   PROT 5.8 (L) 10/22/2020 1245   PROT 6.6 08/22/2020 1050   ALBUMIN 3.0 (L) 10/22/2020 1245   ALBUMIN 4.4 08/22/2020 1050   AST 61 (H) 10/22/2020 1245   ALT 21 10/22/2020 1245   ALKPHOS 66 10/22/2020 1245   BILITOT 1.0 10/22/2020 1245   BILITOT 0.3 08/22/2020 1050   GFRNONAA >60 10/24/2020 0021   GFRAA 77 05/01/2020 1115   Lab Results  Component Value Date   CHOL 191 08/22/2020   HDL 48 08/22/2020   LDLCALC 108 (H) 08/22/2020   TRIG 203 (H) 08/22/2020   CHOLHDL 4.0 08/22/2020   Lab Results  Component Value Date   HGBA1C 5.5 10/27/2019   Lab Results  Component Value Date   VITAMINB12 241 10/24/2020    ASSESSMENT AND PLAN 81 y.o. year old female  1.  Fibromyalgia 2.  Migraine headache  -Doing overall well, headaches stable -Continue Topamax and Lyrica, she takes Lyrica up to 3 times daily, most days just 2 times -Call for worsening headache, continue follow-up with PCP, return here in 1 year or sooner if needed  Margie Ege, Edrick Oh, DNP 07/31/2021, 1:33 PM [facility] [address] [place], [place] [address] [phone]

## 2021-07-31 ENCOUNTER — Other Ambulatory Visit: Payer: Self-pay | Admitting: Nurse Practitioner

## 2021-07-31 ENCOUNTER — Ambulatory Visit: Payer: Medicare Other | Admitting: Neurology

## 2021-07-31 ENCOUNTER — Encounter: Payer: Self-pay | Admitting: Neurology

## 2021-07-31 VITALS — BP 96/56 | HR 74 | Ht 64.5 in | Wt 156.0 lb

## 2021-07-31 DIAGNOSIS — Z8673 Personal history of transient ischemic attack (TIA), and cerebral infarction without residual deficits: Secondary | ICD-10-CM

## 2021-07-31 DIAGNOSIS — R519 Headache, unspecified: Secondary | ICD-10-CM

## 2021-07-31 MED ORDER — PREGABALIN 25 MG PO CAPS: ORAL_CAPSULE | ORAL | 1 refills | Status: DC

## 2021-07-31 NOTE — Patient Instructions (Signed)
Meds ordered this encounter  Medications   pregabalin (LYRICA) 25 MG capsule    Sig: Take 1 capsule 3 times daily    Dispense:  270 capsule    Refill:  1

## 2021-08-02 ENCOUNTER — Telehealth: Payer: Self-pay

## 2021-08-02 ENCOUNTER — Telehealth: Payer: Medicare Other

## 2021-08-02 NOTE — Telephone Encounter (Signed)
  Care Management   Follow Up Note   08/02/2021 Name: [name] MRN: [id number] DOB: 10-Aug-1940   Referred by: [name], FNP Reason for referral : Chronic Care Management (RN CM Follow up call #2)   A second unsuccessful telephone outreach was attempted today. The patient was referred to the case management team for assistance with care management and care coordination.   Follow Up Plan: A HIPPA compliant phone message was left for the patient providing contact information and requesting a return call.   Barb Merino, RN, BSN, CCM Care Management Coordinator [facility]  Direct Phone: [phone]

## 2021-08-16 ENCOUNTER — Ambulatory Visit: Payer: Self-pay

## 2021-08-16 ENCOUNTER — Telehealth: Payer: Medicare Other

## 2021-08-16 DIAGNOSIS — I2510 Atherosclerotic heart disease of native coronary artery without angina pectoris: Secondary | ICD-10-CM

## 2021-08-16 DIAGNOSIS — Z8673 Personal history of transient ischemic attack (TIA), and cerebral infarction without residual deficits: Secondary | ICD-10-CM

## 2021-08-16 DIAGNOSIS — E559 Vitamin D deficiency, unspecified: Secondary | ICD-10-CM

## 2021-08-16 DIAGNOSIS — E7800 Pure hypercholesterolemia, unspecified: Secondary | ICD-10-CM

## 2021-08-16 DIAGNOSIS — F3341 Major depressive disorder, recurrent, in partial remission: Secondary | ICD-10-CM

## 2021-08-16 DIAGNOSIS — R413 Other amnesia: Secondary | ICD-10-CM

## 2021-08-16 NOTE — Chronic Care Management (AMB) (Signed)
  Care Management   Follow Up Note   08/16/2021 Name: [name] MRN: [id number] DOB: 17-Jan-1941   Referred by: Arnette Felts, FNP Reason for referral : Chronic Care Management (RN CM Follow up call #3)   Third unsuccessful telephone outreach was attempted today. The patient was referred to the case management team for assistance with care management and care coordination. The patient's primary care provider has been notified of our unsuccessful attempts to make or maintain contact with the patient. The care management team is pleased to engage with this patient at any time in the future should he/she be interested in assistance from the care management team.   Follow Up Plan: We have been unable to make contact with the patient for follow up. The care management team is available to follow up with the patient after provider conversation with the patient regarding recommendation for care management engagement and subsequent re-referral to the care management team.   Delsa Sale, RN, BSN, CCM Care Management Coordinator [facility]  Direct Phone: [phone]

## 2021-09-02 ENCOUNTER — Encounter (INDEPENDENT_AMBULATORY_CARE_PROVIDER_SITE_OTHER): Payer: Self-pay | Admitting: Ophthalmology

## 2021-09-02 ENCOUNTER — Ambulatory Visit (INDEPENDENT_AMBULATORY_CARE_PROVIDER_SITE_OTHER): Payer: Medicare Other | Admitting: Ophthalmology

## 2021-09-02 DIAGNOSIS — H2512 Age-related nuclear cataract, left eye: Secondary | ICD-10-CM | POA: Diagnosis not present

## 2021-09-02 DIAGNOSIS — H353132 Nonexudative age-related macular degeneration, bilateral, intermediate dry stage: Secondary | ICD-10-CM | POA: Diagnosis not present

## 2021-09-02 DIAGNOSIS — H353211 Exudative age-related macular degeneration, right eye, with active choroidal neovascularization: Secondary | ICD-10-CM

## 2021-09-02 DIAGNOSIS — H35721 Serous detachment of retinal pigment epithelium, right eye: Secondary | ICD-10-CM | POA: Diagnosis not present

## 2021-09-02 MED ORDER — BEVACIZUMAB 2.5 MG/0.1ML IZ SOSY
2.5000 mg | PREFILLED_SYRINGE | INTRAVITREAL | Status: AC | PRN
  Administered 2021-09-02: 2.5 mg via INTRAVITREAL

## 2021-09-02 NOTE — Assessment & Plan Note (Signed)
Component of wet AMD.  Controlled and stable on current interval examination and therapy 5 weeks using Avastin

## 2021-09-02 NOTE — Assessment & Plan Note (Signed)
OS no sign of CNVM today by OCT

## 2021-10-07 ENCOUNTER — Encounter (INDEPENDENT_AMBULATORY_CARE_PROVIDER_SITE_OTHER): Payer: Self-pay | Admitting: Ophthalmology

## 2021-10-07 ENCOUNTER — Ambulatory Visit (INDEPENDENT_AMBULATORY_CARE_PROVIDER_SITE_OTHER): Payer: Medicare Other | Admitting: Ophthalmology

## 2021-10-07 ENCOUNTER — Encounter (INDEPENDENT_AMBULATORY_CARE_PROVIDER_SITE_OTHER): Payer: Medicare Other | Admitting: Ophthalmology

## 2021-10-07 DIAGNOSIS — H353211 Exudative age-related macular degeneration, right eye, with active choroidal neovascularization: Secondary | ICD-10-CM | POA: Diagnosis not present

## 2021-10-07 DIAGNOSIS — H353132 Nonexudative age-related macular degeneration, bilateral, intermediate dry stage: Secondary | ICD-10-CM | POA: Diagnosis not present

## 2021-10-07 DIAGNOSIS — H35721 Serous detachment of retinal pigment epithelium, right eye: Secondary | ICD-10-CM

## 2021-10-07 MED ORDER — BEVACIZUMAB CHEMO INJECTION 1.25MG/0.05ML SYRINGE FOR KALEIDOSCOPE
1.2500 mg | INTRAVITREAL | Status: AC | PRN
  Administered 2021-10-07: 1.25 mg via INTRAVITREAL

## 2021-10-07 NOTE — Assessment & Plan Note (Signed)
With chronic serous retinal detachment, stable acuity stable findings at 5 weeks.  Will need to repeat injection today to maintain

## 2021-10-07 NOTE — Assessment & Plan Note (Signed)
No sign of CNVM OS 

## 2021-10-07 NOTE — Progress Notes (Signed)
10/07/2021     CHIEF COMPLAINT Patient presents for  Chief Complaint  Patient presents with   Macular Degeneration      HISTORY OF PRESENT ILLNESS: [name] is a 81 y.o. female who presents to the clinic today for:   HPI   Pt states her vision has been stable  Pt denies any new floaters or FOL  Pt states she is still having headaches around her eyes Last edited by Aleene Davidson, CMA on 10/07/2021  2:18 PM.      Referring physician: Arnette Felts, FNP [address] [place],  [place] [address]  HISTORICAL INFORMATION:   Selected notes from the MEDICAL RECORD NUMBER    Lab Results  Component Value Date   HGBA1C 5.5 10/27/2019     CURRENT MEDICATIONS: Current Outpatient Medications (Ophthalmic Drugs)  Medication Sig   Polyethyl Glycol-Propyl Glycol (SYSTANE OP) Apply to eye. As needed   No current facility-administered medications for this visit. (Ophthalmic Drugs)   Current Outpatient Medications (Other)  Medication Sig   acetaminophen (TYLENOL) 500 MG tablet Take 1,000 mg by mouth every 6 (six) hours as needed.   cetirizine (ZYRTEC) 10 MG tablet Take 10 mg by mouth daily.   Cholecalciferol (VITAMIN D) 2000 units tablet Take 2,000 Units by mouth daily.   clopidogrel (PLAVIX) 75 MG tablet TAKE 1 TABLET BY MOUTH EVERY DAY   fluticasone (FLONASE) 50 MCG/ACT nasal spray Place into both nostrils daily as needed for allergies or rhinitis.   pregabalin (LYRICA) 25 MG capsule Take 1 capsule 3 times daily   sertraline (ZOLOFT) 50 MG tablet TAKE 1 TABLET BY MOUTH EVERY DAY   topiramate (TOPAMAX) 25 MG tablet TAKE ONE TABLET IN THE MORNING AND 3 IN THE EVENING   vitamin C (ASCORBIC ACID) 250 MG tablet Take 250 mg by mouth daily.    No current facility-administered medications for this visit. (Other)      REVIEW OF SYSTEMS: ROS   Negative for: Constitutional, Gastrointestinal, Neurological, Skin, Genitourinary, Musculoskeletal, HENT, Endocrine,  Cardiovascular, Eyes, Respiratory, Psychiatric, Allergic/Imm, Heme/Lymph Last edited by Erling Cruz D, CMA on 10/07/2021  2:18 PM.       ALLERGIES Allergies  Allergen Reactions   Aspirin Anaphylaxis and Hives   Elavil [Amitriptyline] Anaphylaxis   Latex    Relafen [Nabumetone] Hives    PAST MEDICAL HISTORY Past Medical History:  Diagnosis Date   Abnormality of gait 04/10/2015   Cervical spondylosis without myelopathy 07/18/2013   Common migraine with intractable migraine 04/10/2015   DJD (degenerative joint disease)    Fibromyalgia    Hiatal hernia    Migraine headache    Vitreomacular adhesion of right eye 03/08/2020   Past Surgical History:  Procedure Laterality Date   ABDOMINAL HYSTERECTOMY     DILATION AND CURETTAGE OF UTERUS     HIATAL HERNIA REPAIR     TONSILLECTOMY      FAMILY HISTORY Family History  Problem Relation Age of Onset   CVA Mother    Stroke Father    Emphysema Father     SOCIAL HISTORY Social History   Tobacco Use   Smoking status: Never   Smokeless tobacco: Never  Vaping Use   Vaping Use: Never used  Substance Use Topics   Alcohol use: No    Comment: very occasional   Drug use: No         OPHTHALMIC EXAM:  Base Eye Exam     Visual Acuity (ETDRS)  Right Left   Dist [place] 20/30 20/25         Tonometry (Tonopen, 2:28 PM)       Right Left   Pressure 6 14         Pupils       Pupils APD   Right PERRL None   Left PERRL None         Visual Fields       Left Right     Full         Extraocular Movement       Right Left    Full, Ortho Full, Ortho         Neuro/Psych     Oriented x3: Yes   Mood/Affect: Normal         Dilation     Right eye: 2.5% Phenylephrine, 1.0% Mydriacyl @ 2:20 PM           Slit Lamp and Fundus Exam     External Exam       Right Left   External Normal Normal         Slit Lamp Exam       Right Left   Lids/Lashes Normal Normal   Conjunctiva/Sclera White  and quiet White and quiet   Cornea Clear Clear   Anterior Chamber Deep and quiet Deep and quiet   Iris Round and reactive Round and reactive   Lens Centered posterior chamber intraocular lens, Open posterior capsule 2+ Nuclear sclerosis   Anterior Vitreous Normal Normal         Fundus Exam       Right Left   Posterior Vitreous Central vitreous floaters    Disc Normal    C/D Ratio 0.2    Macula Macular thickening, Smaller Subretinal neovascular membrane, Retinal pigment epithelial mottling and hyperpigmentation subfoveal, Early age related macular degeneration, 79D exam    Vessels Normal    Periphery Normal             IMAGING AND PROCEDURES  Imaging and Procedures for 10/07/21  OCT, Retina - OU - Both Eyes       Right Eye Quality was good. Scan locations included subfoveal. Central Foveal Thickness: 368. Progression has been stable. Findings include no IRF, abnormal foveal contour, pigment epithelial detachment, vitreous traction.   Left Eye Quality was good. Scan locations included subfoveal. Central Foveal Thickness: 267. Progression has been stable. Findings include retinal drusen .   Notes OD with vascularized PED with  stable subretinal fluid today at follow-up interval of 5 weeks.  Post use of Avastin with less subretinal fluid  Apparent posterior vitreous detachment now , repeat injection today  OS with incidental posterior vitreous detachment     Intravitreal Injection, Pharmacologic Agent - OD - Right Eye       Time Out 10/07/2021. 3:00 PM. Confirmed correct patient, procedure, site, and patient consented.   Anesthesia Topical anesthesia was used. Anesthetic medications included Lidocaine 4%.   Procedure Preparation included 5% betadine to ocular surface, 10% betadine to eyelids, Tobramycin 0.3%, Ofloxacin . A 30 gauge needle was used.   Injection: 1.25 mg Bevacizumab 1.25mg /0.4ml   Route: Intravitreal, Site: Right Eye   NDC: [id number]    Post-op Post injection exam found visual acuity of at least counting fingers. The patient tolerated the procedure well. There were no complications. The patient received written and verbal post procedure care education. Post injection medications included ocuflox.  ASSESSMENT/PLAN:  Exudative age-related macular degeneration of right eye with active choroidal neovascularization (HCC) With chronic serous retinal detachment, stable acuity stable findings at 5 weeks.  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 Exudative age-related macular degeneration of right eye with active choroidal neovascularization (HCC)  H35.3211 OCT, Retina - OU - Both Eyes    Intravitreal Injection, Pharmacologic Agent - OD - Right Eye    Bevacizumab (AVASTIN) SOLN 1.25 mg    2. Intermediate stage nonexudative age-related macular degeneration of both eyes  H35.3132     3. 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  2.  3.  Ophthalmic Meds Ordered this visit:  Meds ordered this encounter  Medications   Bevacizumab (AVASTIN) SOLN 1.25 mg       Return in about 5 weeks (around 11/11/2021) for dilate, OD, AVASTIN OCT.  There are no Patient Instructions on file for this visit.   Explained the diagnoses, plan, and follow up with the patient and they expressed understanding.  Patient expressed understanding of the importance of proper follow up care.   Alford Highland Majesti Gambrell M.D. Diseases & Surgery of the Retina and Vitreous [facility] 10/07/21     Abbreviations: M myopia (nearsighted); A astigmatism; H hyperopia (farsighted); P presbyopia; Mrx spectacle prescription;  CTL contact lenses; OD right eye; OS left eye; OU both eyes   XT exotropia; ET esotropia; PEK punctate epithelial keratitis; PEE punctate epithelial erosions; DES dry eye syndrome; MGD meibomian gland dysfunction; ATs artificial tears; PFAT's preservative free artificial tears; NSC nuclear sclerotic cataract; PSC posterior subcapsular cataract; ERM epi-retinal membrane; PVD posterior vitreous detachment; RD retinal detachment; DM diabetes mellitus; DR diabetic retinopathy; NPDR non-proliferative diabetic retinopathy; PDR proliferative diabetic retinopathy; CSME clinically significant macular edema; DME diabetic macular edema; dbh dot blot hemorrhages; CWS cotton wool spot; POAG primary open angle glaucoma; C/D cup-to-disc ratio; HVF humphrey visual field; GVF goldmann visual field; OCT optical coherence tomography; IOP intraocular pressure; BRVO Branch retinal vein occlusion; CRVO central retinal vein occlusion; CRAO central retinal artery occlusion; BRAO branch retinal artery occlusion; RT retinal tear; SB scleral buckle; PPV pars plana vitrectomy; VH Vitreous hemorrhage; PRP panretinal laser photocoagulation; IVK intravitreal kenalog; VMT vitreomacular traction; MH Macular hole;  NVD neovascularization of the disc; NVE neovascularization elsewhere; AREDS age related eye disease study; ARMD age related macular degeneration; POAG primary open angle glaucoma; EBMD epithelial/anterior basement membrane dystrophy; ACIOL anterior chamber intraocular lens; IOL intraocular lens; PCIOL posterior chamber intraocular lens; Phaco/IOL phacoemulsification with intraocular lens placement; PRK photorefractive keratectomy; LASIK laser assisted in situ keratomileusis; HTN hypertension; DM diabetes mellitus; COPD chronic obstructive pulmonary disease

## 2021-10-07 NOTE — Assessment & Plan Note (Signed)
Component of wet AMD continue treatment

## 2021-10-10 ENCOUNTER — Ambulatory Visit (INDEPENDENT_AMBULATORY_CARE_PROVIDER_SITE_OTHER): Payer: Medicare Other

## 2021-10-10 VITALS — BP 102/60 | HR 68 | Temp 98.0°F | Ht 65.0 in | Wt 157.6 lb

## 2021-10-10 DIAGNOSIS — Z23 Encounter for immunization: Secondary | ICD-10-CM | POA: Diagnosis not present

## 2021-10-10 DIAGNOSIS — Z0000 Encounter for general adult medical examination without abnormal findings: Secondary | ICD-10-CM | POA: Diagnosis not present

## 2021-10-10 NOTE — Addendum Note (Signed)
Addended by: Barb Merino on: 10/10/2021 04:29 PM   Modules accepted: Orders

## 2021-10-10 NOTE — Patient Instructions (Signed)
[name] , Thank you for taking time to come for your Medicare Wellness Visit. I appreciate your ongoing commitment to your health goals. Please review the following plan we discussed and let me know if I can assist you in the future.   Screening recommendations/referrals: Colonoscopy: not required Mammogram: patient to schedule Bone Density: completed 03/29/2015 Recommended yearly ophthalmology/optometry visit for glaucoma screening and checkup Recommended yearly dental visit for hygiene and checkup  Vaccinations: Influenza vaccine: due Pneumococcal vaccine: today Tdap vaccine: completed 03/22/2013, due 03/23/2023 Shingles vaccine: discussed   Covid-19: 09/04/2020, 07/25/2019, 06/27/2019  Advanced directives: Please bring a copy of your POA (Power of Attorney) and/or Living Will to your next appointment.   Conditions/risks identified: none  Next appointment: Follow up in one year for your annual wellness visit    Preventive Care 65 Years and Older, Female Preventive care refers to lifestyle choices and visits with your health care provider that can promote health and wellness. What does preventive care include? A yearly physical exam. This is also called an annual well check. Dental exams once or twice a year. Routine eye exams. Ask your health care provider how often you should have your eyes checked. Personal lifestyle choices, including: Daily care of your teeth and gums. Regular physical activity. Eating a healthy diet. Avoiding tobacco and drug use. Limiting alcohol use. Practicing safe sex. Taking low-dose aspirin every day. Taking vitamin and mineral supplements as recommended by your health care provider. What happens during an annual well check? The services and screenings done by your health care provider during your annual well check will depend on your age, overall health, lifestyle risk factors, and family history of disease. Counseling  Your health care provider  may ask you questions about your: Alcohol use. Tobacco use. Drug use. Emotional well-being. Home and relationship well-being. Sexual activity. Eating habits. History of falls. Memory and ability to understand (cognition). Work and work Astronomer. Reproductive health. Screening  You may have the following tests or measurements: Height, weight, and BMI. Blood pressure. Lipid and cholesterol levels. These may be checked every 5 years, or more frequently if you are over 48 years old. Skin check. Lung cancer screening. You may have this screening every year starting at age 68 if you have a 30-pack-year history of smoking and currently smoke or have quit within the past 15 years. Fecal occult blood test (FOBT) of the stool. You may have this test every year starting at age 48. Flexible sigmoidoscopy or colonoscopy. You may have a sigmoidoscopy every 5 years or a colonoscopy every 10 years starting at age 74. Hepatitis C blood test. Hepatitis B blood test. Sexually transmitted disease (STD) testing. Diabetes screening. This is done by checking your blood sugar (glucose) after you have not eaten for a while (fasting). You may have this done every 1-3 years. Bone density scan. This is done to screen for osteoporosis. You may have this done starting at age 71. Mammogram. This may be done every 1-2 years. Talk to your health care provider about how often you should have regular mammograms. Talk with your health care provider about your test results, treatment options, and if necessary, the need for more tests. Vaccines  Your health care provider may recommend certain vaccines, such as: Influenza vaccine. This is recommended every year. Tetanus, diphtheria, and acellular pertussis (Tdap, Td) vaccine. You may need a Td booster every 10 years. Zoster vaccine. You may need this after age 54. Pneumococcal 13-valent conjugate (PCV13) vaccine. One dose is  recommended after age 66. Pneumococcal  polysaccharide (PPSV23) vaccine. One dose is recommended after age 43. Talk to your health care provider about which screenings and vaccines you need and how often you need them. This information is not intended to replace advice given to you by your health care provider. Make sure you discuss any questions you have with your health care provider. Document Released: 03/23/2015 Document Revised: 11/14/2015 Document Reviewed: 12/26/2014 Elsevier Interactive Patient Education  2017 [place] Prevention in the Home Falls can cause injuries. They can happen to people of all ages. There are many things you can do to make your home safe and to help prevent falls. What can I do on the outside of my home? Regularly fix the edges of walkways and driveways and fix any cracks. Remove anything that might make you trip as you walk through a door, such as a raised step or threshold. Trim any bushes or trees on the path to your home. Use bright outdoor lighting. Clear any walking paths of anything that might make someone trip, such as rocks or tools. Regularly check to see if handrails are loose or broken. Make sure that both sides of any steps have handrails. Any raised decks and porches should have guardrails on the edges. Have any leaves, snow, or ice cleared regularly. Use sand or salt on walking paths during winter. Clean up any spills in your garage right away. This includes oil or grease spills. What can I do in the bathroom? Use night lights. Install grab bars by the toilet and in the tub and shower. Do not use towel bars as grab bars. Use non-skid mats or decals in the tub or shower. If you need to sit down in the shower, use a plastic, non-slip stool. Keep the floor dry. Clean up any water that spills on the floor as soon as it happens. Remove soap buildup in the tub or shower regularly. Attach bath mats securely with double-sided non-slip rug tape. Do not have throw rugs and other  things on the floor that can make you trip. What can I do in the bedroom? Use night lights. Make sure that you have a light by your bed that is easy to reach. Do not use any sheets or blankets that are too big for your bed. They should not hang down onto the floor. Have a firm chair that has side arms. You can use this for support while you get dressed. Do not have throw rugs and other things on the floor that can make you trip. What can I do in the kitchen? Clean up any spills right away. Avoid walking on wet floors. Keep items that you use a lot in easy-to-reach places. If you need to reach something above you, use a strong step stool that has a grab bar. Keep electrical cords out of the way. Do not use floor polish or wax that makes floors slippery. If you must use wax, use non-skid floor wax. Do not have throw rugs and other things on the floor that can make you trip. What can I do with my stairs? Do not leave any items on the stairs. Make sure that there are handrails on both sides of the stairs and use them. Fix handrails that are broken or loose. Make sure that handrails are as long as the stairways. Check any carpeting to make sure that it is firmly attached to the stairs. Fix any carpet that is loose or worn. Avoid having  throw rugs at the top or bottom of the stairs. If you do have throw rugs, attach them to the floor with carpet tape. Make sure that you have a light switch at the top of the stairs and the bottom of the stairs. If you do not have them, ask someone to add them for you. What else can I do to help prevent falls? Wear shoes that: Do not have high heels. Have rubber bottoms. Are comfortable and fit you well. Are closed at the toe. Do not wear sandals. If you use a stepladder: Make sure that it is fully opened. Do not climb a closed stepladder. Make sure that both sides of the stepladder are locked into place. Ask someone to hold it for you, if possible. Clearly  mark and make sure that you can see: Any grab bars or handrails. First and last steps. Where the edge of each step is. Use tools that help you move around (mobility aids) if they are needed. These include: Canes. Walkers. Scooters. Crutches. Turn on the lights when you go into a dark area. Replace any light bulbs as soon as they burn out. Set up your furniture so you have a clear path. Avoid moving your furniture around. If any of your floors are uneven, fix them. If there are any pets around you, be aware of where they are. Review your medicines with your doctor. Some medicines can make you feel dizzy. This can increase your chance of falling. Ask your doctor what other things that you can do to help prevent falls. This information is not intended to replace advice given to you by your health care provider. Make sure you discuss any questions you have with your health care provider. Document Released: 12/21/2008 Document Revised: 08/02/2015 Document Reviewed: 03/31/2014 Elsevier Interactive Patient Education  2017 [employer].

## 2021-10-10 NOTE — Progress Notes (Signed)
Subjective:   [name] is a 81 y.o. female who presents for Medicare Annual (Subsequent) preventive examination.  Review of Systems     Cardiac Risk Factors include: advanced age (>8men, >101 women)     Objective:    Today's Vitals   10/10/21 1542 10/10/21 1548  BP: 102/60   Pulse: 68   Temp: 98 F (36.7 C)   TempSrc: Oral   SpO2: 97%   Weight: 157 lb 9.6 oz (71.5 kg)   Height: 5\' 5"  (1.651 m)   PainSc:  5    Body mass index is 26.23 kg/m.     10/10/2021    3:58 PM 09/27/2020   10:38 AM 06/18/2020    2:47 PM 08/24/2018   11:57 AM 04/10/2015   10:10 AM 11/20/2014    9:04 AM 05/18/2014    8:56 AM  Advanced Directives  Does Patient Have a Medical Advance Directive? Yes Yes Yes Yes Yes No No  Type of 07/18/2014 of [place];Living will Healthcare Power of [place];Living will Healthcare Power of [place] Power of [employer] Power of [place];Living will    Does patient want to make changes to medical advance directive?   No - Patient declined      Copy of Healthcare Power of Attorney in Chart? No - copy requested No - copy requested  No - copy requested     Would patient like information on creating a medical advance directive?       Yes - Educational materials given    Current Medications (verified) Outpatient Encounter Medications as of 10/10/2021  Medication Sig   acetaminophen (TYLENOL) 500 MG tablet Take 1,000 mg by mouth every 6 (six) hours as needed.   cetirizine (ZYRTEC) 10 MG tablet Take 10 mg by mouth daily.   Cholecalciferol (VITAMIN D) 2000 units tablet Take 2,000 Units by mouth daily.   clopidogrel (PLAVIX) 75 MG tablet TAKE 1 TABLET BY MOUTH EVERY DAY   fluticasone (FLONASE) 50 MCG/ACT nasal spray Place into both nostrils daily as needed for allergies or rhinitis.   Polyethyl Glycol-Propyl Glycol (SYSTANE OP) Apply to eye. As needed   pregabalin (LYRICA) 25 MG capsule Take 1 capsule 3 times daily   topiramate  (TOPAMAX) 25 MG tablet TAKE ONE TABLET IN THE MORNING AND 3 IN THE EVENING   vitamin C (ASCORBIC ACID) 250 MG tablet Take 250 mg by mouth daily.    sertraline (ZOLOFT) 50 MG tablet TAKE 1 TABLET BY MOUTH EVERY DAY (Patient not taking: Reported on 10/10/2021)   No facility-administered encounter medications on file as of 10/10/2021.    Allergies (verified) Aspirin, Elavil [amitriptyline], Latex, and Relafen [nabumetone]   History: Past Medical History:  Diagnosis Date   Abnormality of gait 04/10/2015   Cervical spondylosis without myelopathy 07/18/2013   Common migraine with intractable migraine 04/10/2015   DJD (degenerative joint disease)    Fibromyalgia    Hiatal hernia    Migraine headache    Vitreomacular adhesion of right eye 03/08/2020   Past Surgical History:  Procedure Laterality Date   ABDOMINAL HYSTERECTOMY     DILATION AND CURETTAGE OF UTERUS     HIATAL HERNIA REPAIR     TONSILLECTOMY     Family History  Problem Relation Age of Onset   CVA Mother    Stroke Father    Emphysema Father    Social History   Socioeconomic History   Marital status: Widowed    Spouse name: Not on  file   Number of children: 5   Years of education: college   Highest education level: Not on file  Occupational History   Occupation: Retired  Tobacco Use   Smoking status: Never   Smokeless tobacco: Never  Vaping Use   Vaping Use: Never used  Substance and Sexual Activity   Alcohol use: No    Comment: very occasional   Drug use: No   Sexual activity: Not Currently  Other Topics Concern   Not on file  Social History Narrative   Patient is married with 5 children.   Patient is right handed.   Patient has college education.   Patient drinks 3 cups daily.         Social Determinants of Health   Financial Resource Strain: Low Risk  (10/10/2021)   Overall Financial Resource Strain (CARDIA)    Difficulty of Paying Living Expenses: Not hard at all  Food Insecurity: Food Insecurity  Present (10/10/2021)   Hunger Vital Sign    Worried About Running Out of Food in the Last Year: Often true    Ran Out of Food in the Last Year: Often true  Transportation Needs: No Transportation Needs (10/10/2021)   PRAPARE - Administrator, Civil Service (Medical): No    Lack of Transportation (Non-Medical): No  Physical Activity: Inactive (10/10/2021)   Exercise Vital Sign    Days of Exercise per Week: 0 days    Minutes of Exercise per Session: 0 min  Stress: No Stress Concern Present (10/10/2021)   [employer] of Occupational Health - Occupational Stress Questionnaire    Feeling of Stress : Not at all  Social Connections: Not on file    Tobacco Counseling Counseling given: Not Answered   Clinical Intake:  Pre-visit preparation completed: Yes  Pain : 0-10 Pain Score: 5  Pain Type: Chronic pain Pain Location: Shoulder Pain Orientation: Right Pain Descriptors / Indicators: Sharp Pain Onset: More than a month ago Pain Frequency: Intermittent Pain Relieving Factors: APAP helps  Pain Relieving Factors: APAP helps  Nutritional Status: BMI 25 -29 Overweight Nutritional Risks: Nausea/ vomitting/ diarrhea (diarrhea at times depending on food) Diabetes: No  How often do you need to have someone help you when you read instructions, pamphlets, or other written materials from your doctor or pharmacy?: 1 - Never What is the last grade level you completed in school?: some college  Diabetic? no  Interpreter Needed?: No  Information entered by :: NAllen LPN   Activities of Daily Living    10/10/2021    4:01 PM  In your present state of health, do you have any difficulty performing the following activities:  Hearing? 1  Comment Has hearing aids but does not wear them  Vision? 0  Difficulty concentrating or making decisions? 0  Walking or climbing stairs? 1  Dressing or bathing? 0  Doing errands, shopping? 0  Preparing Food and eating ? N  Using the Toilet? N   In the past six months, have you accidently leaked urine? Y  Do you have problems with loss of bowel control? Y  Comment when she eats certain things  Managing your Medications? N  Managing your Finances? N  Housekeeping or managing your Housekeeping? N    Patient Care Team: Arnette Felts, FNP as PCP - General (General Practice)  Indicate any recent Medical Services you may have received from other than [facility] providers in the past year (date may be approximate).     Assessment:  This is a routine wellness examination for [place].  Hearing/Vision screen Vision Screening - Comments:: Regular eye exams, Dr. Luciana Axe  Dietary issues and exercise activities discussed: Current Exercise Habits: The patient does not participate in regular exercise at present   Goals Addressed             This Visit's Progress    Patient Stated       10/10/2021, keep living       Depression Screen    10/10/2021    4:00 PM 09/27/2020   10:39 AM 08/22/2020   10:14 AM 05/01/2020    9:59 AM 10/27/2019   10:08 AM 06/28/2019    9:34 AM 06/28/2019    9:33 AM  PHQ 2/9 Scores  PHQ - 2 Score 0 0 0 1 0 0 0  PHQ- 9 Score   0 5 0 0     Fall Risk    10/10/2021    3:59 PM 09/27/2020   10:39 AM 08/22/2020   10:14 AM 06/28/2019    9:33 AM 02/24/2019   10:25 AM  Fall Risk   Falls in the past year? 1 0 0 0 0  Comment passed out      Number falls in past yr: 1  0    Injury with Fall? 0  1    Risk for fall due to : Medication side effect Medication side effect No Fall Risks    Follow up Falls evaluation completed;Education provided;Falls prevention discussed Falls evaluation completed;Education provided;Falls prevention discussed Falls evaluation completed      FALL RISK PREVENTION PERTAINING TO THE HOME:  Any stairs in or around the home? Yes  If so, are there any without handrails? Yes  Home free of loose throw rugs in walkways, pet beds, electrical cords, etc? Yes  Adequate lighting in your home to reduce  risk of falls? Yes   ASSISTIVE DEVICES UTILIZED TO PREVENT FALLS:  Life alert? No  Use of a cane, walker or w/c? Yes  Grab bars in the bathroom? Yes  Shower chair or bench in shower? No  Elevated toilet seat or a handicapped toilet? Yes   TIMED UP AND GO:  Was the test performed? No .    Gait slow and steady without use of assistive device  Cognitive Function:    05/01/2020   11:05 AM  MMSE - Mini Mental State Exam  Orientation to time 5  Orientation to Place 5  Registration 3  Attention/ Calculation 5  Recall 3  Language- name 2 objects 2  Language- repeat 1  Language- follow 3 step command 3  Language- read & follow direction 1  Write a sentence 1  Copy design 1  Total score 30        10/10/2021    4:06 PM 09/27/2020   10:44 AM 05/01/2020   10:55 AM 08/24/2018   12:04 PM  6CIT Screen  What Year? 0 points 0 points 0 points 0 points  What month? 0 points 0 points 0 points 0 points  What time? 0 points 0 points 3 points 0 points  Count back from 20 0 points 0 points 0 points 0 points  Months in reverse 0 points 0 points 0 points 0 points  Repeat phrase 4 points 4 points 0 points 0 points  Total Score 4 points 4 points 3 points 0 points    Immunizations Immunization History  Administered Date(s) Administered   Influenza, High Dose Seasonal PF 02/24/2019   Influenza-Unspecified 01/06/2018  Moderna SARS-COV2 Booster Vaccination 09/04/2020   Moderna Sars-Covid-2 Vaccination 06/27/2019, 07/25/2019   PNEUMOCOCCAL CONJUGATE-20 10/10/2021   Pneumococcal Polysaccharide-23 10/21/2018, 10/28/2019   Tdap 03/22/2013    TDAP status: Up to date  Flu Vaccine status: Due, Education has been provided regarding the importance of this vaccine. Advised may receive this vaccine at local pharmacy or Health Dept. Aware to provide a copy of the vaccination record if obtained from local pharmacy or Health Dept. Verbalized acceptance and understanding.  Pneumococcal vaccine status:  Completed during today's visit.  Covid-19 vaccine status: Completed vaccines  Qualifies for Shingles Vaccine? Yes   Zostavax completed No   Shingrix Completed?: No.    Education has been provided regarding the importance of this vaccine. Patient has been advised to call insurance company to determine out of pocket expense if they have not yet received this vaccine. Advised may also receive vaccine at local pharmacy or Health Dept. Verbalized acceptance and understanding.  Screening Tests Health Maintenance  Topic Date Due   COVID-19 Vaccine (3 - Moderna series) 10/30/2020   MAMMOGRAM  05/21/2021   INFLUENZA VACCINE  10/08/2021   Zoster Vaccines- Shingrix (1 of 2) 01/10/2022 (Originally 11/18/1991)   TETANUS/TDAP  03/23/2023   Pneumonia Vaccine 5+ Years old  Completed   DEXA SCAN  Completed   Hepatitis C Screening  Completed   HPV VACCINES  Aged Out    Health Maintenance  Health Maintenance Due  Topic Date Due   COVID-19 Vaccine (3 - Moderna series) 10/30/2020   MAMMOGRAM  05/21/2021   INFLUENZA VACCINE  10/08/2021    Colorectal cancer screening: No longer required.   Mammogram status: patient to schedule  Bone Density status: Completed 03/29/2015.   Lung Cancer Screening: (Low Dose CT Chest recommended if Age 53-80 years, 30 pack-year currently smoking OR have quit w/in 15years.) does not qualify.   Lung Cancer Screening Referral: no  Additional Screening:  Hepatitis C Screening: does qualify; Completed 10/27/2019  Vision Screening: Recommended annual ophthalmology exams for early detection of glaucoma and other disorders of the eye. Is the patient up to date with their annual eye exam?  Yes  Who is the provider or what is the name of the office in which the patient attends annual eye exams? Dr. Luciana Axe If pt is not established with a provider, would they like to be referred to a provider to establish care? No .   Dental Screening: Recommended annual dental exams for  proper oral hygiene  Community Resource Referral / Chronic Care Management: CRR required this visit?  No   CCM required this visit?  No      Plan:     I have personally reviewed and noted the following in the patient's chart:   Medical and social history Use of alcohol, tobacco or illicit drugs  Current medications and supplements including opioid prescriptions.  Functional ability and status Nutritional status Physical activity Advanced directives List of other physicians Hospitalizations, surgeries, and ER visits in previous 12 months Vitals Screenings to include cognitive, depression, and falls Referrals and appointments  In addition, I have reviewed and discussed with patient certain preventive protocols, quality metrics, and best practice recommendations. A written personalized care plan for preventive services as well as general preventive health recommendations were provided to patient.     Barb Merino, LPN   11/16/2424   Nurse Notes: none

## 2021-10-11 ENCOUNTER — Telehealth: Payer: Self-pay

## 2021-10-11 NOTE — Telephone Encounter (Signed)
   Telephone encounter was:  Unsuccessful.  10/11/2021 Name: [name] MRN: [id number] DOB: [birth year]  Unsuccessful outbound call made today to assist with:  Food Insecurity  Outreach Attempt:  1st Attempt  A HIPAA compliant voice message was left requesting a return call.  Instructed patient to call back at earliest convenience. Lenard Forth Care Guide, Embedded Care Coordination [facility], Care Management  [phone] [address] [place], [place], [place] [address] Phone: [phone] Email: [name]

## 2021-10-14 ENCOUNTER — Telehealth: Payer: Self-pay

## 2021-10-14 NOTE — Telephone Encounter (Signed)
   Telephone encounter was:  Unsuccessful.  10/14/2021 Name: CORSICA FRANSON MRN: [id number] DOB: 1940/08/18  Unsuccessful outbound call made today to assist with:  Food Insecurity  Outreach Attempt:  2nd Attempt  A HIPAA compliant voice message was left requesting a return call.  Instructed patient to call back at  earliest convenience.   Lenard Forth Care Guide, Embedded Care Coordination [facility], Care Management  [phone] [address] [place], [place], [place] [address] Phone: [phone] Email: [name]

## 2021-10-16 ENCOUNTER — Telehealth: Payer: Self-pay

## 2021-10-16 NOTE — Telephone Encounter (Signed)
   Telephone encounter was:  Unsuccessful.  10/16/2021 Name: [name] MRN: [id number] DOB: 06-23-1940  Unsuccessful outbound call made today to assist with:  Food Insecurity  Outreach Attempt:  3rd Attempt.  Referral closed unable to contact patient.  A HIPAA compliant voice message was left requesting a return call.  Instructed patient to call back at  earliest convenience. Lenard Forth Care Guide, Embedded Care Coordination [facility], Care Management  [phone] [address] [place], [place], [place] [address] Phone: [phone] Email: [name]

## 2021-10-16 NOTE — Telephone Encounter (Signed)
   Telephone encounter was:  Successful.  10/16/2021 Name: [name] MRN: [id number] DOB: Nov 27, 1940  [name] is a 81 y.o. year old female who is a primary care patient of Arnette Felts, FNP . The community resource team was consulted for assistance with Food Insecurity  Care guide performed the following interventions: Patient provided with information about care guide support team and interviewed to confirm resource needs.  Follow Up Plan:  No further follow up planned at this time. The patient has been provided with needed resources.    Lenard Forth Care Guide, Embedded Care Coordination [facility], Care Management  [phone] [address] [place], [place], [place] [address] Phone: [phone] Email: [name]

## 2021-11-06 ENCOUNTER — Ambulatory Visit (INDEPENDENT_AMBULATORY_CARE_PROVIDER_SITE_OTHER): Payer: Medicare Other | Admitting: Nurse Practitioner

## 2021-11-06 ENCOUNTER — Encounter: Payer: Self-pay | Admitting: Nurse Practitioner

## 2021-11-06 ENCOUNTER — Ambulatory Visit: Payer: Medicare Other

## 2021-11-06 VITALS — BP 116/68 | HR 87 | Temp 98.4°F | Ht 65.0 in | Wt 157.0 lb

## 2021-11-06 DIAGNOSIS — E7800 Pure hypercholesterolemia, unspecified: Secondary | ICD-10-CM

## 2021-11-06 DIAGNOSIS — Z1231 Encounter for screening mammogram for malignant neoplasm of breast: Secondary | ICD-10-CM | POA: Diagnosis not present

## 2021-11-06 DIAGNOSIS — F3341 Major depressive disorder, recurrent, in partial remission: Secondary | ICD-10-CM

## 2021-11-06 DIAGNOSIS — R7309 Other abnormal glucose: Secondary | ICD-10-CM | POA: Diagnosis not present

## 2021-11-06 DIAGNOSIS — E538 Deficiency of other specified B group vitamins: Secondary | ICD-10-CM | POA: Diagnosis not present

## 2021-11-06 DIAGNOSIS — E559 Vitamin D deficiency, unspecified: Secondary | ICD-10-CM | POA: Diagnosis not present

## 2021-11-06 DIAGNOSIS — M797 Fibromyalgia: Secondary | ICD-10-CM

## 2021-11-06 DIAGNOSIS — M545 Low back pain, unspecified: Secondary | ICD-10-CM

## 2021-11-06 DIAGNOSIS — I2510 Atherosclerotic heart disease of native coronary artery without angina pectoris: Secondary | ICD-10-CM

## 2021-11-06 DIAGNOSIS — M25511 Pain in right shoulder: Secondary | ICD-10-CM

## 2021-11-06 MED ORDER — TRIAMCINOLONE ACETONIDE 40 MG/ML IJ SUSP
40.0000 mg | Freq: Once | INTRAMUSCULAR | Status: AC
  Administered 2021-11-06: 40 mg via INTRAMUSCULAR

## 2021-11-06 NOTE — Progress Notes (Signed)
I,[name],acting as a Education administrator for [employer], FNP.,have documented all relevant documentation on the behalf of Mary Brine, FNP,as directed by  Mary Brine, FNP while in the presence of [name], [place].  Subjective:     Patient ID: [name] , female    DOB: 03/05/1941 , 81 y.o.   MRN: [id number]   Chief Complaint  Patient presents with   Depression    HPI  Patient presents today for a f/u on her depression and her vitamin b12. She is taking 25 mg sertraline but was not taking the 50 mg she said she did not have it however she had a 90 day supply with one refill from March.   Depression        This is a chronic problem.  The current episode started more than 1 month ago.   The onset quality is gradual.   Associated symptoms include no decreased concentration and no fatigue.     The symptoms are aggravated by nothing.  Compliance with treatment is good.   Pertinent negatives include no chronic fatigue syndrome, no chronic pain and no anxiety.    Past Medical History:  Diagnosis Date   Abnormality of gait 04/10/2015   Cervical spondylosis without myelopathy 07/18/2013   Common migraine with intractable migraine 04/10/2015   DJD (degenerative joint disease)    Fibromyalgia    Hiatal hernia    Migraine headache    Vitreomacular adhesion of right eye 03/08/2020     Family History  Problem Relation Age of Onset   CVA Mother    Stroke Father    Emphysema Father      Current Outpatient Medications:    acetaminophen (TYLENOL) 500 MG tablet, Take 1,000 mg by mouth every 6 (six) hours as needed., Disp: , Rfl:    cetirizine (ZYRTEC) 10 MG tablet, Take 10 mg by mouth daily., Disp: , Rfl:    Cholecalciferol (VITAMIN D) 2000 units tablet, Take 2,000 Units by mouth daily., Disp: , Rfl:    clopidogrel (PLAVIX) 75 MG tablet, TAKE 1 TABLET BY MOUTH EVERY DAY, Disp: 90 tablet, Rfl: 1   fluticasone (FLONASE) 50 MCG/ACT nasal spray, Place into both nostrils daily as needed for  allergies or rhinitis., Disp: , Rfl:    Polyethyl Glycol-Propyl Glycol (SYSTANE OP), Apply to eye. As needed, Disp: , Rfl:    pregabalin (LYRICA) 25 MG capsule, Take 1 capsule 3 times daily, Disp: 270 capsule, Rfl: 1   topiramate (TOPAMAX) 25 MG tablet, TAKE ONE TABLET IN THE MORNING AND 3 IN THE EVENING, Disp: 360 tablet, Rfl: 3   vitamin C (ASCORBIC ACID) 250 MG tablet, Take 250 mg by mouth daily. , Disp: , Rfl:    Allergies  Allergen Reactions   Aspirin Anaphylaxis and Hives   Elavil [Amitriptyline] Anaphylaxis   Latex    Relafen [Nabumetone] Hives     Review of Systems  Constitutional: Negative.  Negative for fatigue.  Respiratory: Negative.    Cardiovascular: Negative.   Gastrointestinal: Negative.   Musculoskeletal:  Positive for arthralgias and back pain (low back pain radiating up to her left shoulder).  Neurological: Negative.   Psychiatric/Behavioral:  Positive for depression. Negative for decreased concentration.      Today's Vitals   11/06/21 1123  BP: 116/68  Pulse: 87  Temp: 98.4 F (36.9 C)  TempSrc: Oral  Weight: 157 lb (71.2 kg)  Height: _0  (1.651 m)   Body mass index is 26.13 kg/m.  Wt Readings from  Last 3 Encounters:  11/18/21 157 lb (71.2 kg)  11/06/21 157 lb (71.2 kg)  10/10/21 157 lb 9.6 oz (71.5 kg)    Objective:  Physical Exam Vitals reviewed.  Constitutional:      General: She is not in acute distress.    Appearance: Normal appearance.  Cardiovascular:     Rate and Rhythm: Normal rate and regular rhythm.     Pulses: Normal pulses.     Heart sounds: Normal heart sounds. No murmur heard. Pulmonary:     Effort: Pulmonary effort is normal. No respiratory distress.     Breath sounds: No wheezing.  Musculoskeletal:        General: Tenderness (low back) present. No swelling, deformity or signs of injury.     Right lower leg: No edema.     Left lower leg: No edema.  Skin:    General: Skin is warm and dry.     Capillary Refill: Capillary  refill takes less than 2 seconds.  Neurological:     General: No focal deficit present.     Mental Status: She is alert and oriented to person, place, and time.     Cranial Nerves: No cranial nerve deficit.     Motor: No weakness.  Psychiatric:        Mood and Affect: Mood normal.        Behavior: Behavior normal.        Thought Content: Thought content normal.        Judgment: Judgment normal.         Assessment And Plan:     1. Recurrent major depressive disorder, in partial remission ([place]) Comments: Overall she feels like she is doing well, she has not been taking her medication. Attempted to call her dgt to see if needs to continue, no answer  2. Atherosclerotic cardiovascular disease Comments: Stable, continue statin. Tolerating well  3. Vitamin D deficiency  4. Elevated cholesterol Comments: Continue statin, tolerating well  - CMP14+EGFR  5. Abnormal glucose Comments: Stable, no current medications. Diet controlled.   6. Fibromyalgia Comments: Stable.  - triamcinolone acetonide (KENALOG-40) injection 40 mg  7. B12 deficiency Comments: Will recheck levels had been getting vitamin B12 injections - Vitamin B12  8. Acute right-sided low back pain without sciatica Comments: Will treat with Kenalog as this is causing her discomfort in her daily lifetsyle.  - triamcinolone acetonide (KENALOG-40) injection 40 mg  9. Encounter for screening mammogram for breast cancer - MM Digital Screening; Future     Patient was given opportunity to ask questions. Patient verbalized understanding of the plan and was able to repeat key elements of the plan. All questions were answered to their satisfaction.  Mary Brine, FNP   I, Mary Brine, FNP, have reviewed all documentation for this visit. The documentation on 11/06/21 for the exam, diagnosis, procedures, and orders are all accurate and complete.   IF YOU HAVE BEEN REFERRED TO A SPECIALIST, IT MAY TAKE 1-2 WEEKS TO  SCHEDULE/PROCESS THE REFERRAL. IF YOU HAVE NOT HEARD FROM US/SPECIALIST IN TWO WEEKS, PLEASE GIVE [place] A CALL AT [phone] X [phone].   THE PATIENT IS ENCOURAGED TO PRACTICE SOCIAL DISTANCING DUE TO THE COVID-19 PANDEMIC.

## 2021-11-06 NOTE — Patient Instructions (Signed)
Major Depressive Disorder, Adult Major depressive disorder is a mental health condition. This disorder affects feelings. It can also affect the body. Symptoms of this condition last most of the day, almost every day, for 2 weeks. This disorder can affect: Relationships. Daily activities, such as work and school. Activities that you normally like to do. What are the causes? The cause of this condition is not known. The disorder is likely caused by a mix of things, including: Your personality, such as being a shy person. Your behavior, or how you act toward others. Your thoughts and feelings. Too much alcohol or drugs. How you react to stress. Health and mental problems that you have had for a long time. Things that hurt you in the past (trauma). Big changes in your life, such as divorce. What increases the risk? The following factors may make you more likely to develop this condition: Having family members with depression. Being a woman. Problems in the family. Low levels of some brain chemicals. Things that caused you pain as a child, especially if you lost a parent or were abused. A lot of stress in your life, such as from: Living without basic needs of life, such as food and shelter. Being treated poorly because of race, sex, or religion (discrimination). Health and mental problems that you have had for a long time. What are the signs or symptoms? The main symptoms of this condition are: Being sad all the time. Being grouchy all the time. Loss of interest in things and activities. Other symptoms include: Sleeping too much or too little. Eating too much or too little. Gaining or losing weight, without knowing why. Feeling tired or having low energy. Being restless and weak. Feeling hopeless, worthless, or guilty. Trouble thinking clearly or making decisions. Thoughts of hurting yourself or others, or thoughts of ending your life. Spending a lot of time alone. Inability to  complete common tasks of daily life. If you have very bad MDD, you may: Believe things that are not true. Hear, see, taste, or feel things that are not there. Have mild depression that lasts for at least 2 years. Feel very sad and hopeless. Have trouble speaking or moving. How is this treated? This condition may be treated with: Talk therapy. This teaches you to know bad thoughts, feelings, and actions and how to change them. This can also help you to communicate with others. This can be done with members of your family. Medicines. These can be used to treat worry (anxiety), depression, or low levels of chemicals in the brain. Lifestyle changes. You may need to: Limit alcohol use. Limit drug use. Get regular exercise. Get plenty of sleep. Make healthy eating choices. Spend more time outdoors. Brain stimulation. This treatment excites the brain. This is done when symptoms are very bad or have not gotten better with other treatments. Follow these instructions at home: Activity Get regular exercise as told. Spend time outdoors as told. Make time to do the things you enjoy. Find ways to deal with stress. Try to: Meditate. Do deep breathing. Spend time in nature. Keep a journal. Return to your normal activities as told by your doctor. Ask your doctor what activities are safe for you. Alcohol and drug use If you drink alcohol: Limit how much you use to: 0-1 drink a day for women. 0-2 drinks a day for men. Be aware of how much alcohol is in your drink. In the [place] one drink equals one 12 oz bottle of beer (355 mL),   one 5 oz glass of wine (148 mL), or one 1 oz glass of hard liquor (44 mL). Talk to your doctor about: Alcohol use. Alcohol can affect some medicines. Any drug use. General instructions  Take over-the-counter and prescription medicines and herbal preparations only as told by your doctor. Eat a healthy diet. Get a lot of sleep. Think about joining a support group.  Your doctor may be able to suggest one. Keep all follow-up visits as told by your doctor. This is important. Where to find more information: National Alliance on Mental Illness: www.nami.org [place] National Institute of Mental Health: www.nimh.nih.gov American Psychiatric Association: www.psychiatry.org/patients-families/ Contact a doctor if: Your symptoms get worse. You get new symptoms. Get help right away if: You hurt yourself. You have serious thoughts about hurting yourself or others. You see, hear, taste, smell, or feel things that are not there. If you ever feel like you may hurt yourself or others, or have thoughts about taking your own life, get help right away. Go to your nearest emergency department or: Call your local emergency services (911 in the [place] Call a suicide crisis helpline, such as the National Suicide Prevention Lifeline at 1-800-273-8255 or 988 in the [place] This is open 24 hours a day in the [place] Text the Crisis Text Line at 741741 (in the [place] Summary Major depressive disorder is a mental health condition. This disorder affects feelings. Symptoms of this condition last most of the day, almost every day, for 2 weeks. The symptoms of this disorder can cause problems with relationships and with daily activities. There are treatments and support for people who get this disorder. You may need more than one type of treatment. Get help right away if you have serious thoughts about hurting yourself or others. This information is not intended to replace advice given to you by your health care provider. Make sure you discuss any questions you have with your health care provider. Document Revised: 09/19/2020 Document Reviewed: 02/05/2019 Elsevier Patient Education  2023 Elsevier Inc.  

## 2021-11-07 LAB — CMP14+EGFR
ALT: 8 IU/L (ref 0–32)
AST: 10 IU/L (ref 0–40)
Albumin/Globulin Ratio: 1.8 (ref 1.2–2.2)
Albumin: 4.1 g/dL (ref 3.8–4.8)
Alkaline Phosphatase: 85 IU/L (ref 44–121)
BUN/Creatinine Ratio: 14 (ref 12–28)
BUN: 13 mg/dL (ref 8–27)
Bilirubin Total: 0.2 mg/dL (ref 0.0–1.2)
CO2: 24 mmol/L (ref 20–29)
Calcium: 9.7 mg/dL (ref 8.7–10.3)
Chloride: 106 mmol/L (ref 96–106)
Creatinine, Ser: 0.95 mg/dL (ref 0.57–1.00)
Globulin, Total: 2.3 g/dL (ref 1.5–4.5)
Glucose: 90 mg/dL (ref 70–99)
Potassium: 5.2 mmol/L (ref 3.5–5.2)
Sodium: 143 mmol/L (ref 134–144)
Total Protein: 6.4 g/dL (ref 6.0–8.5)
eGFR: 61 mL/min/{1.73_m2} (ref >59)

## 2021-11-07 LAB — VITAMIN B12: Vitamin B-12: 472 pg/mL (ref 232–1245)

## 2021-11-14 ENCOUNTER — Ambulatory Visit (INDEPENDENT_AMBULATORY_CARE_PROVIDER_SITE_OTHER): Payer: Medicare Other | Admitting: Ophthalmology

## 2021-11-14 ENCOUNTER — Encounter (INDEPENDENT_AMBULATORY_CARE_PROVIDER_SITE_OTHER): Payer: Self-pay | Admitting: Ophthalmology

## 2021-11-14 DIAGNOSIS — H353132 Nonexudative age-related macular degeneration, bilateral, intermediate dry stage: Secondary | ICD-10-CM

## 2021-11-14 DIAGNOSIS — H353211 Exudative age-related macular degeneration, right eye, with active choroidal neovascularization: Secondary | ICD-10-CM

## 2021-11-14 DIAGNOSIS — H35721 Serous detachment of retinal pigment epithelium, right eye: Secondary | ICD-10-CM | POA: Diagnosis not present

## 2021-11-14 MED ORDER — BEVACIZUMAB CHEMO INJECTION 1.25MG/0.05ML SYRINGE FOR KALEIDOSCOPE
1.2500 mg | INTRAVITREAL | Status: AC | PRN
  Administered 2021-11-14: 1.25 mg via INTRAVITREAL

## 2021-11-14 NOTE — Assessment & Plan Note (Signed)
Component of wet AMD.  Improving chronic

## 2021-11-14 NOTE — Progress Notes (Signed)
11/14/2021     CHIEF COMPLAINT Patient presents for  Chief Complaint  Patient presents with   Macular Degeneration      HISTORY OF PRESENT ILLNESS: [name] is a 81 y.o. female who presents to the clinic today for:   HPI   5 weeks for DILATE OD, AVASTIN OCT. Pt stated vision has been stable since last visit.  Last edited by Silvestre Moment on 11/14/2021  2:13 PM.      Referring physician: [name], [facility] [place],   [id number]  HISTORICAL INFORMATION:   Selected notes from the MEDICAL RECORD NUMBER    Lab Results  Component Value Date   HGBA1C 5.5 10/27/2019     CURRENT MEDICATIONS: Current Outpatient Medications (Ophthalmic Drugs)  Medication Sig   Polyethyl Glycol-Propyl Glycol (SYSTANE OP) Apply to eye. As needed   No current facility-administered medications for this visit. (Ophthalmic Drugs)   Current Outpatient Medications (Other)  Medication Sig   acetaminophen (TYLENOL) 500 MG tablet Take 1,000 mg by mouth every 6 (six) hours as needed.   cetirizine (ZYRTEC) 10 MG tablet Take 10 mg by mouth daily.   Cholecalciferol (VITAMIN D) 2000 units tablet Take 2,000 Units by mouth daily.   clopidogrel (PLAVIX) 75 MG tablet TAKE 1 TABLET BY MOUTH EVERY DAY   fluticasone (FLONASE) 50 MCG/ACT nasal spray Place into both nostrils daily as needed for allergies or rhinitis.   pregabalin (LYRICA) 25 MG capsule Take 1 capsule 3 times daily   topiramate (TOPAMAX) 25 MG tablet TAKE ONE TABLET IN THE MORNING AND 3 IN THE EVENING   vitamin C (ASCORBIC ACID) 250 MG tablet Take 250 mg by mouth daily.    No current facility-administered medications for this visit. (Other)      REVIEW OF SYSTEMS: ROS   Negative for: Constitutional, Gastrointestinal, Neurological, Skin, Genitourinary, Musculoskeletal, HENT, Endocrine, Cardiovascular, Eyes, Respiratory, Psychiatric, Allergic/Imm, Heme/Lymph Last edited by Silvestre Moment on 11/14/2021  2:13 PM.        ALLERGIES Allergies  Allergen Reactions   Aspirin Anaphylaxis and Hives   Elavil [Amitriptyline] Anaphylaxis   Latex    Relafen [Nabumetone] Hives    PAST MEDICAL HISTORY Past Medical History:  Diagnosis Date   Abnormality of gait 04/10/2015   Cervical spondylosis without myelopathy 07/18/2013   Common migraine with intractable migraine 04/10/2015   DJD (degenerative joint disease)    Fibromyalgia    Hiatal hernia    Migraine headache    Vitreomacular adhesion of right eye 03/08/2020   Past Surgical History:  Procedure Laterality Date   ABDOMINAL HYSTERECTOMY     DILATION AND CURETTAGE OF UTERUS     HIATAL HERNIA REPAIR     TONSILLECTOMY      FAMILY HISTORY Family History  Problem Relation Age of Onset   CVA Mother    Stroke Father    Emphysema Father     SOCIAL HISTORY Social History   Tobacco Use   Smoking status: Never   Smokeless tobacco: Never  Vaping Use   Vaping Use: Never used  Substance Use Topics   Alcohol use: No    Comment: very occasional   Drug use: No         OPHTHALMIC EXAM:  Base Eye Exam     Visual Acuity (ETDRS)       Right Left   Dist [place] 20/30 -2 +2 20/30 -1   Dist ph [place] 20/25 -2  Tonometry (Tonopen, 2:22 PM)       Right Left   Pressure 12 12         Pupils       Pupils APD   Right PERRL None   Left PERRL None         Visual Fields       Left Right    Full Full         Extraocular Movement       Right Left    Full, Ortho Full, Ortho         Neuro/Psych     Oriented x3: Yes   Mood/Affect: Normal         Dilation     Right eye: 2.5% Phenylephrine, 1.0% Mydriacyl @ 2:22 PM           Slit Lamp and Fundus Exam     External Exam       Right Left   External Normal Normal         Slit Lamp Exam       Right Left   Lids/Lashes Normal Normal   Conjunctiva/Sclera White and quiet White and quiet   Cornea Clear Clear   Anterior Chamber Deep and quiet Deep and quiet    Iris Round and reactive Round and reactive   Lens Centered posterior chamber intraocular lens, Open posterior capsule 2+ Nuclear sclerosis   Anterior Vitreous Normal Normal         Fundus Exam       Right Left   Posterior Vitreous Central vitreous floaters    Disc Normal    C/D Ratio 0.2    Macula Macular thickening, Smaller Subretinal neovascular membrane, Retinal pigment epithelial mottling and hyperpigmentation subfoveal, Early age related macular degeneration, 79D exam    Vessels Normal    Periphery Normal             IMAGING AND PROCEDURES  Imaging and Procedures for 11/14/21  OCT, Retina - OU - Both Eyes       Right Eye Quality was good. Scan locations included subfoveal. Central Foveal Thickness: 314. Progression has been stable. Findings include no IRF, abnormal foveal contour, pigment epithelial detachment, vitreous traction.   Left Eye Quality was good. Scan locations included subfoveal. Central Foveal Thickness: 271. Progression has been stable. Findings include retinal drusen .   Notes OD with vascularized PED with  stable subretinal fluid today at follow-up interval of 6 weeks.  Post use of Avastin with less subretinal fluid  Apparent posterior vitreous detachment now , repeat injection today  OS with incidental posterior vitreous detachment     Intravitreal Injection, Pharmacologic Agent - OD - Right Eye       Time Out 11/14/2021. 3:14 PM. Confirmed correct patient, procedure, site, and patient consented.   Anesthesia Topical anesthesia was used. Anesthetic medications included Lidocaine 4%.   Procedure Preparation included 5% betadine to ocular surface, 10% betadine to eyelids, Tobramycin 0.3%, Ofloxacin . A 30 gauge needle was used.   Injection: 1.25 mg Bevacizumab 1.25mg /0.67ml   Route: Intravitreal, Site: Right Eye   NDC: [id number], Lot: [id number], Expiration date: 01/22/2022   Post-op Post injection exam found visual acuity of at least  counting fingers. The patient tolerated the procedure well. There were no complications. The patient received written and verbal post procedure care education. Post injection medications included ocuflox.              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  Improving chronic  Exudative age-related macular degeneration of right eye with active choroidal neovascularization (HCC) Today at 5-1/2-week interval.  Repeat injection today and reevaluate next in 7 weeks      ICD-10-CM   1. Exudative age-related macular degeneration of right eye with active choroidal neovascularization (HCC)  H35.3211 OCT, Retina - OU - Both Eyes    Intravitreal Injection, Pharmacologic Agent - OD - Right Eye    Bevacizumab (AVASTIN) SOLN 1.25 mg    2. Intermediate stage nonexudative age-related macular degeneration of both eyes  H35.3132     3. 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  2.  3.  Ophthalmic Meds Ordered this visit:  Meds ordered this encounter  Medications   Bevacizumab (AVASTIN) SOLN 1.25 mg       Return in about 7 weeks (around 01/02/2022) for dilate, OD, AVASTIN OCT.  There are no Patient Instructions on file for this visit.   Explained the diagnoses, plan, and follow up with the patient and they expressed understanding.  Patient expressed understanding of the importance of proper follow up care.   Alford Highland Lewanda Perea M.D. Diseases & Surgery of the Retina and Vitreous [facility] 11/14/21     Abbreviations: M myopia (nearsighted); A astigmatism; H hyperopia (farsighted); P presbyopia; Mrx spectacle prescription;  CTL contact lenses; OD right eye; OS left eye; OU both eyes  XT exotropia; ET esotropia; PEK punctate epithelial keratitis; PEE punctate epithelial  erosions; DES dry eye syndrome; MGD meibomian gland dysfunction; ATs artificial tears; PFAT's preservative free artificial tears; NSC nuclear sclerotic cataract; PSC posterior subcapsular cataract; ERM epi-retinal membrane; PVD posterior vitreous detachment; RD retinal detachment; DM diabetes mellitus; DR diabetic retinopathy; NPDR non-proliferative diabetic retinopathy; PDR proliferative diabetic retinopathy; CSME clinically significant macular edema; DME diabetic macular edema; dbh dot blot hemorrhages; CWS cotton wool spot; POAG primary open angle glaucoma; C/D cup-to-disc ratio; HVF humphrey visual field; GVF goldmann visual field; OCT optical coherence tomography; IOP intraocular pressure; BRVO Branch retinal vein occlusion; CRVO central retinal vein occlusion; CRAO central retinal artery occlusion; BRAO branch retinal artery occlusion; RT retinal tear; SB scleral buckle; PPV pars plana vitrectomy; VH Vitreous hemorrhage; PRP panretinal laser photocoagulation; IVK intravitreal kenalog; VMT vitreomacular traction; MH Macular hole;  NVD neovascularization of the disc; NVE neovascularization elsewhere; AREDS age related eye disease study; ARMD age related macular degeneration; POAG primary open angle glaucoma; EBMD epithelial/anterior basement membrane dystrophy; ACIOL anterior chamber intraocular lens; IOL intraocular lens; PCIOL posterior chamber intraocular lens; Phaco/IOL phacoemulsification with intraocular lens placement; PRK photorefractive keratectomy; LASIK laser assisted in situ keratomileusis; HTN hypertension; DM diabetes mellitus; COPD chronic obstructive pulmonary disease

## 2021-11-14 NOTE — Assessment & Plan Note (Signed)
No sign of CNVM OS by OCT today 

## 2021-11-14 NOTE — Assessment & Plan Note (Signed)
Today at 5-1/2-week interval.  Repeat injection today and reevaluate next in 7 weeks

## 2021-11-18 ENCOUNTER — Ambulatory Visit: Payer: Medicare Other

## 2021-11-18 ENCOUNTER — Ambulatory Visit (INDEPENDENT_AMBULATORY_CARE_PROVIDER_SITE_OTHER): Payer: Medicare Other

## 2021-11-18 VITALS — BP 118/68 | HR 92 | Temp 97.3°F | Ht 65.0 in | Wt 157.0 lb

## 2021-11-18 DIAGNOSIS — Z23 Encounter for immunization: Secondary | ICD-10-CM

## 2021-11-18 NOTE — Progress Notes (Signed)
Patient presents today for a flu shot. [name]

## 2021-12-03 DIAGNOSIS — Z1231 Encounter for screening mammogram for malignant neoplasm of breast: Secondary | ICD-10-CM | POA: Diagnosis not present

## 2021-12-03 LAB — HM MAMMOGRAPHY: HM Mammogram: NORMAL (ref 0–4)

## 2022-01-02 ENCOUNTER — Encounter (INDEPENDENT_AMBULATORY_CARE_PROVIDER_SITE_OTHER): Payer: Medicare Other | Admitting: Ophthalmology

## 2022-01-20 ENCOUNTER — Encounter (INDEPENDENT_AMBULATORY_CARE_PROVIDER_SITE_OTHER): Payer: Medicare Other | Admitting: Ophthalmology

## 2022-01-22 DIAGNOSIS — H353211 Exudative age-related macular degeneration, right eye, with active choroidal neovascularization: Secondary | ICD-10-CM | POA: Diagnosis not present

## 2022-01-22 DIAGNOSIS — H43812 Vitreous degeneration, left eye: Secondary | ICD-10-CM | POA: Diagnosis not present

## 2022-01-22 DIAGNOSIS — H35721 Serous detachment of retinal pigment epithelium, right eye: Secondary | ICD-10-CM | POA: Diagnosis not present

## 2022-01-22 DIAGNOSIS — H353132 Nonexudative age-related macular degeneration, bilateral, intermediate dry stage: Secondary | ICD-10-CM | POA: Diagnosis not present

## 2022-01-22 DIAGNOSIS — H2512 Age-related nuclear cataract, left eye: Secondary | ICD-10-CM | POA: Diagnosis not present

## 2022-01-24 ENCOUNTER — Other Ambulatory Visit: Payer: Self-pay | Admitting: Nurse Practitioner

## 2022-01-24 DIAGNOSIS — Z8673 Personal history of transient ischemic attack (TIA), and cerebral infarction without residual deficits: Secondary | ICD-10-CM

## 2022-02-26 DIAGNOSIS — H2512 Age-related nuclear cataract, left eye: Secondary | ICD-10-CM | POA: Diagnosis not present

## 2022-02-26 DIAGNOSIS — H353211 Exudative age-related macular degeneration, right eye, with active choroidal neovascularization: Secondary | ICD-10-CM | POA: Diagnosis not present

## 2022-02-26 DIAGNOSIS — H35721 Serous detachment of retinal pigment epithelium, right eye: Secondary | ICD-10-CM | POA: Diagnosis not present

## 2022-02-26 DIAGNOSIS — H353132 Nonexudative age-related macular degeneration, bilateral, intermediate dry stage: Secondary | ICD-10-CM | POA: Diagnosis not present

## 2022-02-26 DIAGNOSIS — H43812 Vitreous degeneration, left eye: Secondary | ICD-10-CM | POA: Diagnosis not present

## 2022-04-16 ENCOUNTER — Encounter (INDEPENDENT_AMBULATORY_CARE_PROVIDER_SITE_OTHER): Payer: Medicare Other | Admitting: Ophthalmology

## 2022-04-16 DIAGNOSIS — H43813 Vitreous degeneration, bilateral: Secondary | ICD-10-CM

## 2022-04-16 DIAGNOSIS — H353211 Exudative age-related macular degeneration, right eye, with active choroidal neovascularization: Secondary | ICD-10-CM

## 2022-04-16 DIAGNOSIS — H353121 Nonexudative age-related macular degeneration, left eye, early dry stage: Secondary | ICD-10-CM

## 2022-05-08 ENCOUNTER — Encounter: Payer: Self-pay | Admitting: Nurse Practitioner

## 2022-05-08 ENCOUNTER — Ambulatory Visit (INDEPENDENT_AMBULATORY_CARE_PROVIDER_SITE_OTHER): Payer: Medicare Other | Admitting: Nurse Practitioner

## 2022-05-08 VITALS — BP 116/70 | HR 65 | Temp 97.8°F | Ht 65.0 in | Wt 165.0 lb

## 2022-05-08 DIAGNOSIS — R590 Localized enlarged lymph nodes: Secondary | ICD-10-CM

## 2022-05-08 DIAGNOSIS — G4700 Insomnia, unspecified: Secondary | ICD-10-CM | POA: Diagnosis not present

## 2022-05-08 DIAGNOSIS — J3489 Other specified disorders of nose and nasal sinuses: Secondary | ICD-10-CM

## 2022-05-08 DIAGNOSIS — E559 Vitamin D deficiency, unspecified: Secondary | ICD-10-CM | POA: Diagnosis not present

## 2022-05-08 DIAGNOSIS — E7800 Pure hypercholesterolemia, unspecified: Secondary | ICD-10-CM

## 2022-05-08 DIAGNOSIS — R634 Abnormal weight loss: Secondary | ICD-10-CM | POA: Insufficient documentation

## 2022-05-08 DIAGNOSIS — E876 Hypokalemia: Secondary | ICD-10-CM | POA: Diagnosis not present

## 2022-05-08 DIAGNOSIS — K5900 Constipation, unspecified: Secondary | ICD-10-CM | POA: Insufficient documentation

## 2022-05-08 DIAGNOSIS — Z0001 Encounter for general adult medical examination with abnormal findings: Secondary | ICD-10-CM

## 2022-05-08 DIAGNOSIS — Z0000 Encounter for general adult medical examination without abnormal findings: Secondary | ICD-10-CM | POA: Diagnosis not present

## 2022-05-08 DIAGNOSIS — R7309 Other abnormal glucose: Secondary | ICD-10-CM

## 2022-05-08 DIAGNOSIS — K5730 Diverticulosis of large intestine without perforation or abscess without bleeding: Secondary | ICD-10-CM | POA: Insufficient documentation

## 2022-05-08 MED ORDER — MAGNESIUM 250 MG PO TABS: ORAL_TABLET | ORAL | 2 refills | Status: DC

## 2022-05-08 MED ORDER — AMOXICILLIN 875 MG PO TABS: 875.0000 mg | ORAL_TABLET | Freq: Two times a day (BID) | ORAL | 0 refills | Status: DC

## 2022-05-08 NOTE — Progress Notes (Signed)
I,Sheena H Holbrook,acting as a Education administrator for [name], FNP.,have documented all relevant documentation on the behalf of [name], FNP,as directed by  [name], FNP while in the presence of [name], [place].   Subjective:     Patient ID: [name] , female    DOB: [birth year] , 82 y.o.   MRN: [id number]   Chief Complaint  Patient presents with   Annual Exam    HPI  Patient presents today for annual exam. Patient does have seasonal allergies and is taking OTC medications. Patient has no other complaints or concerns.   Wt Readings from Last 3 Encounters: 05/08/22 : 165 lb (74.8 kg) 11/18/21 : 157 lb (71.2 kg) 11/06/21 : 157 lb (71.2 kg)  Patient did not take her clothes off.  She will take benadryl to help her sleep. Melatonin she says is ineffective.   Insomnia Primary symptoms: sleep disturbance.   The current episode started more than one month. The problem occurs nightly. PMH includes: depression.      Past Medical History:  Diagnosis Date   Abnormality of gait 04/10/2015   Cervical spondylosis without myelopathy 07/18/2013   Common migraine with intractable migraine 04/10/2015   DJD (degenerative joint disease)    Fibromyalgia    Hiatal hernia    Migraine headache    Vitreomacular adhesion of right eye 03/08/2020     Family History  Problem Relation Age of Onset   CVA Mother    Stroke Father    Emphysema Father      Current Outpatient Medications:    acetaminophen (TYLENOL) 500 MG tablet, Take 1,000 mg by mouth every 6 (six) hours as needed., Disp: , Rfl:    amoxicillin (AMOXIL) 875 MG tablet, Take 1 tablet (875 mg total) by mouth 2 (two) times daily., Disp: 14 tablet, Rfl: 0   Cholecalciferol (VITAMIN D) 2000 units tablet, Take 2,000 Units by mouth daily., Disp: , Rfl:    clopidogrel (PLAVIX) 75 MG tablet, TAKE 1 TABLET BY MOUTH EVERY DAY, Disp: 90 tablet, Rfl: 1   fexofenadine (ALLEGRA) 60 MG tablet, Take 60 mg by mouth 2 (two) times daily.,  Disp: , Rfl:    fluticasone (FLONASE) 50 MCG/ACT nasal spray, Place into both nostrils daily as needed for allergies or rhinitis., Disp: , Rfl:    Magnesium 250 MG TABS, Take 1 tablet by mouth with evening meal., Disp: 30 tablet, Rfl: 2   Polyethyl Glycol-Propyl Glycol (SYSTANE OP), Apply to eye. As needed, Disp: , Rfl:    pregabalin (LYRICA) 25 MG capsule, Take 1 capsule 3 times daily, Disp: 270 capsule, Rfl: 1   topiramate (TOPAMAX) 25 MG tablet, TAKE ONE TABLET IN THE MORNING AND 3 IN THE EVENING, Disp: 360 tablet, Rfl: 3   trimethoprim-polymyxin b (POLYTRIM) ophthalmic solution, SMARTSIG:In Eye(s), Disp: , Rfl:    vitamin C (ASCORBIC ACID) 250 MG tablet, Take 250 mg by mouth daily. , Disp: , Rfl:    polyethylene glycol (MIRALAX / GLYCOLAX) 17 g packet, Take 17 g by mouth daily., Disp: 14 each, Rfl: 0   Allergies  Allergen Reactions   Aspirin Anaphylaxis and Hives   Elavil [Amitriptyline] Anaphylaxis   Latex    Relafen [Nabumetone] Hives      The patient states she is post menopausal status.   No LMP recorded. Patient is postmenopausal.. Negative for Dysmenorrhea and Negative for Menorrhagia. Negative for: breast discharge, breast lump(s), breast pain and breast self exam. Associated symptoms include abnormal vaginal bleeding. Pertinent negatives  include abnormal bleeding (hematology), anxiety, decreased libido, depression, difficulty falling sleep, dyspareunia, history of infertility, nocturia, sexual dysfunction, sleep disturbances, urinary incontinence, urinary urgency, vaginal discharge and vaginal itching. Diet regular.The patient states her exercise level is minimal - she walks through the house and stretches her legs  The patient's tobacco use is:  Social History   Tobacco Use  Smoking Status Never  Smokeless Tobacco Never   She has been exposed to passive smoke. The patient's alcohol use is:  Social History   Substance and Sexual Activity  Alcohol Use No   Comment: very  occasional     Review of Systems  Constitutional: Negative.  Negative for fatigue.  HENT:  Positive for rhinorrhea and sneezing.   Eyes: Negative.   Respiratory: Negative.    Cardiovascular: Negative.   Gastrointestinal: Negative.   Endocrine: Negative.   Genitourinary: Negative.   Skin: Negative.   Allergic/Immunologic: Negative.   Neurological: Negative.   Hematological: Negative.   Psychiatric/Behavioral:  Positive for depression and sleep disturbance. Negative for decreased concentration. The patient has insomnia.   All other systems reviewed and are negative.    Today's Vitals   05/08/22 1135  BP: 116/70  Pulse: 65  Temp: 97.8 F (36.6 C)  TempSrc: Oral  SpO2: 96%  Weight: 165 lb (74.8 kg)  Height: 5\' 5"  (1.651 m)   Body mass index is 27.46 kg/m.   Objective:  Physical Exam Vitals reviewed.  Constitutional:      General: She is not in acute distress.    Appearance: Normal appearance. She is well-developed. She is obese.  HENT:     Head: Normocephalic and atraumatic.     Right Ear: Hearing, tympanic membrane, ear canal and external ear normal. There is no impacted cerumen.     Left Ear: Hearing, tympanic membrane, ear canal and external ear normal. There is no impacted cerumen.     Nose: Nose normal.     Mouth/Throat:     Mouth: Mucous membranes are moist.  Eyes:     General: Lids are normal.     Extraocular Movements: Extraocular movements intact.     Conjunctiva/sclera: Conjunctivae normal.     Pupils: Pupils are equal, round, and reactive to light.     Funduscopic exam:    Right eye: No papilledema.        Left eye: No papilledema.  Neck:     Thyroid: No thyroid mass.     Vascular: No carotid bruit.     Comments: Bilateral submandibular lymphadenopathy Cardiovascular:     Rate and Rhythm: Normal rate and regular rhythm.     Pulses: Normal pulses.     Heart sounds: Normal heart sounds. No murmur heard. Pulmonary:     Effort: Pulmonary effort is  normal. No respiratory distress.     Breath sounds: Normal breath sounds. No wheezing.  Chest:     Chest wall: No mass.  Breasts:    Tanner Score is 5.  Abdominal:     General: Abdomen is flat. Bowel sounds are normal. There is no distension.     Palpations: Abdomen is soft.     Tenderness: There is no abdominal tenderness.  Genitourinary:    Comments: N/A Musculoskeletal:        General: No swelling, tenderness, deformity or signs of injury. Normal range of motion.     Cervical back: Full passive range of motion without pain, normal range of motion and neck supple.     Right lower leg:  No edema.     Left lower leg: No edema.  Skin:    General: Skin is warm and dry.     Capillary Refill: Capillary refill takes less than 2 seconds.  Neurological:     General: No focal deficit present.     Mental Status: She is alert and oriented to person, place, and time.     Cranial Nerves: No cranial nerve deficit.     Sensory: No sensory deficit.     Motor: No weakness.  Psychiatric:        Mood and Affect: Mood normal.        Behavior: Behavior normal.        Thought Content: Thought content normal.        Judgment: Judgment normal.         Assessment And Plan:     1. Encounter for health maintenance examination Behavior modifications discussed and diet history reviewed.   Pt will continue to exercise regularly and modify diet with low GI, plant based foods and decrease intake of processed foods.  Recommend intake of daily multivitamin, Vitamin D, and calcium.  Recommend for preventive screenings, as well as recommend immunizations that include influenza, TDAP, and Shingles (declines) - CBC  2. Vitamin D deficiency Will check vitamin D level and supplement as needed.    Also encouraged to spend 15 minutes in the sun daily.  - VITAMIN D 25 Hydroxy (Vit-D Deficiency, Fractures)  3. Elevated cholesterol Comments: Cholesterol levels are stable. Continue current regimen - Lipid  panel  4. Abnormal glucose Comments: Stable, diet controlled. Continue focusing on healthy diet and regular exercise as tolerated. - Hemoglobin A1c  5. Hypokalemia Comments: Will recheck potassium level. - CMP14+EGFR  6. Insomnia, unspecified type Comments: Encouraged to take magnesium at evening meal. She is also encouraged to avoid napping during the day - Magnesium 250 MG TABS; Take 1 tablet by mouth with evening meal.  Dispense: 30 tablet; Refill: 2  7. Lymphadenopathy of head and neck region Comments: Submandibular lymphadenopathy, If not better after amoxicillin will check neck ultrasound - amoxicillin (AMOXIL) 875 MG tablet; Take 1 tablet (875 mg total) by mouth 2 (two) times daily.  Dispense: 14 tablet; Refill: 0  8. Rhinorrhea - amoxicillin (AMOXIL) 875 MG tablet; Take 1 tablet (875 mg total) by mouth 2 (two) times daily.  Dispense: 14 tablet; Refill: 0   Patient was given opportunity to ask questions. Patient verbalized understanding of the plan and was able to repeat key elements of the plan. All questions were answered to their satisfaction.   [name], FNP  I, [name], FNP, have reviewed all documentation for this visit. The documentation on 05/08/22 for the exam, diagnosis, procedures, and orders are all accurate and complete.    THE PATIENT IS ENCOURAGED TO PRACTICE SOCIAL DISTANCING DUE TO THE COVID-19 PANDEMIC.

## 2022-05-08 NOTE — Patient Instructions (Signed)
Take over the counter magnesium and melatonin nightly for sleep and return in 6 weeks for f/u.

## 2022-05-09 LAB — CBC
Hematocrit: 44 % (ref 34.0–46.6)
Hemoglobin: 14.6 g/dL (ref 11.1–15.9)
MCH: 30.7 pg (ref 26.6–33.0)
MCHC: 33.2 g/dL (ref 31.5–35.7)
MCV: 92 fL (ref 79–97)
Platelets: 226 10*3/uL (ref 150–450)
RBC: 4.76 x10E6/uL (ref 3.77–5.28)
RDW: 12.8 % (ref 11.7–15.4)
WBC: 5.8 10*3/uL (ref 3.4–10.8)

## 2022-05-09 LAB — LIPID PANEL
Chol/HDL Ratio: 3.2 ratio (ref 0.0–4.4)
Cholesterol, Total: 204 mg/dL — ABNORMAL HIGH (ref 100–199)
HDL: 64 mg/dL (ref >39)
LDL Chol Calc (NIH): 109 mg/dL — ABNORMAL HIGH (ref 0–99)
Triglycerides: 183 mg/dL — ABNORMAL HIGH (ref 0–149)
VLDL Cholesterol Cal: 31 mg/dL (ref 5–40)

## 2022-05-09 LAB — CMP14+EGFR
ALT: 10 IU/L (ref 0–32)
AST: 14 IU/L (ref 0–40)
Albumin/Globulin Ratio: 2.6 — ABNORMAL HIGH (ref 1.2–2.2)
Albumin: 4.6 g/dL (ref 3.8–4.8)
Alkaline Phosphatase: 94 IU/L (ref 44–121)
BUN/Creatinine Ratio: 19 (ref 12–28)
BUN: 19 mg/dL (ref 8–27)
Bilirubin Total: 0.3 mg/dL (ref 0.0–1.2)
CO2: 23 mmol/L (ref 20–29)
Calcium: 9.5 mg/dL (ref 8.7–10.3)
Chloride: 107 mmol/L — ABNORMAL HIGH (ref 96–106)
Creatinine, Ser: 1.01 mg/dL — ABNORMAL HIGH (ref 0.57–1.00)
Globulin, Total: 1.8 g/dL (ref 1.5–4.5)
Glucose: 89 mg/dL (ref 70–99)
Potassium: 5.3 mmol/L — ABNORMAL HIGH (ref 3.5–5.2)
Sodium: 145 mmol/L — ABNORMAL HIGH (ref 134–144)
Total Protein: 6.4 g/dL (ref 6.0–8.5)
eGFR: 56 mL/min/{1.73_m2} — ABNORMAL LOW (ref >59)

## 2022-05-09 LAB — HEMOGLOBIN A1C
Est. average glucose Bld gHb Est-mCnc: 117 mg/dL
Hgb A1c MFr Bld: 5.7 % — ABNORMAL HIGH (ref 4.8–5.6)

## 2022-05-09 LAB — VITAMIN D 25 HYDROXY (VIT D DEFICIENCY, FRACTURES): Vit D, 25-Hydroxy: 37.8 ng/mL (ref 30.0–100.0)

## 2022-05-14 ENCOUNTER — Encounter (INDEPENDENT_AMBULATORY_CARE_PROVIDER_SITE_OTHER): Payer: Medicare Other | Admitting: Ophthalmology

## 2022-05-14 DIAGNOSIS — H353211 Exudative age-related macular degeneration, right eye, with active choroidal neovascularization: Secondary | ICD-10-CM

## 2022-05-14 DIAGNOSIS — H43813 Vitreous degeneration, bilateral: Secondary | ICD-10-CM

## 2022-05-14 DIAGNOSIS — H353121 Nonexudative age-related macular degeneration, left eye, early dry stage: Secondary | ICD-10-CM | POA: Diagnosis not present

## 2022-05-16 ENCOUNTER — Other Ambulatory Visit: Payer: Self-pay | Admitting: Nurse Practitioner

## 2022-05-16 MED ORDER — POLYETHYLENE GLYCOL 3350 17 G PO PACK: 17.0000 g | PACK | Freq: Every day | ORAL | 0 refills | Status: DC

## 2022-05-23 DIAGNOSIS — E7800 Pure hypercholesterolemia, unspecified: Secondary | ICD-10-CM | POA: Insufficient documentation

## 2022-05-29 ENCOUNTER — Other Ambulatory Visit: Payer: Self-pay

## 2022-05-29 DIAGNOSIS — E875 Hyperkalemia: Secondary | ICD-10-CM

## 2022-06-11 ENCOUNTER — Encounter (INDEPENDENT_AMBULATORY_CARE_PROVIDER_SITE_OTHER): Payer: Medicare Other | Admitting: Ophthalmology

## 2022-06-11 DIAGNOSIS — H2512 Age-related nuclear cataract, left eye: Secondary | ICD-10-CM

## 2022-06-11 DIAGNOSIS — H43813 Vitreous degeneration, bilateral: Secondary | ICD-10-CM

## 2022-06-11 DIAGNOSIS — H353120 Nonexudative age-related macular degeneration, left eye, stage unspecified: Secondary | ICD-10-CM | POA: Diagnosis not present

## 2022-06-11 DIAGNOSIS — H353211 Exudative age-related macular degeneration, right eye, with active choroidal neovascularization: Secondary | ICD-10-CM | POA: Diagnosis not present

## 2022-06-11 DIAGNOSIS — Z961 Presence of intraocular lens: Secondary | ICD-10-CM

## 2022-06-12 ENCOUNTER — Other Ambulatory Visit: Payer: Self-pay | Admitting: Nurse Practitioner

## 2022-06-12 ENCOUNTER — Other Ambulatory Visit: Payer: Medicare Other

## 2022-06-12 DIAGNOSIS — E875 Hyperkalemia: Secondary | ICD-10-CM | POA: Diagnosis not present

## 2022-06-13 LAB — COMPREHENSIVE METABOLIC PANEL
ALT: 14 IU/L (ref 0–32)
AST: 17 IU/L (ref 0–40)
Albumin/Globulin Ratio: 2.5 — ABNORMAL HIGH (ref 1.2–2.2)
Albumin: 4.2 g/dL (ref 3.8–4.8)
Alkaline Phosphatase: 91 IU/L (ref 44–121)
BUN/Creatinine Ratio: 13 (ref 12–28)
BUN: 12 mg/dL (ref 8–27)
Bilirubin Total: 0.3 mg/dL (ref 0.0–1.2)
CO2: 26 mmol/L (ref 20–29)
Calcium: 9.6 mg/dL (ref 8.7–10.3)
Chloride: 103 mmol/L (ref 96–106)
Creatinine, Ser: 0.9 mg/dL (ref 0.57–1.00)
Globulin, Total: 1.7 g/dL (ref 1.5–4.5)
Glucose: 169 mg/dL — ABNORMAL HIGH (ref 70–99)
Potassium: 4.5 mmol/L (ref 3.5–5.2)
Sodium: 142 mmol/L (ref 134–144)
Total Protein: 5.9 g/dL — ABNORMAL LOW (ref 6.0–8.5)
eGFR: 65 mL/min/{1.73_m2} (ref >59)

## 2022-06-15 IMAGING — US US ABDOMEN COMPLETE
1 series · 14 of 25 positions shown · non-contrast
Comparison: None

CLINICAL DATA: Generalized and RIGHT-side abdominal pain for 3
months

EXAM:
ABDOMEN ULTRASOUND COMPLETE

[Series 1: us abdomen complete · 0.23mm/px · 14 of 87 slices shown]
[im 1/87]
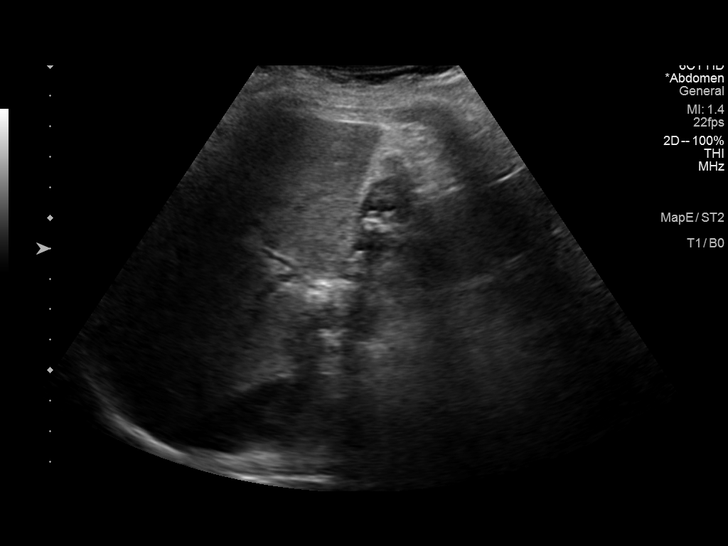
[im 8/87]
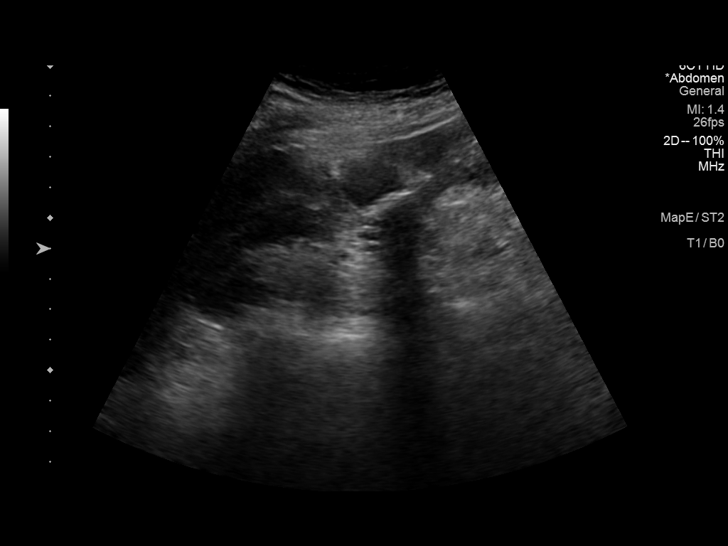
[im 15/87]
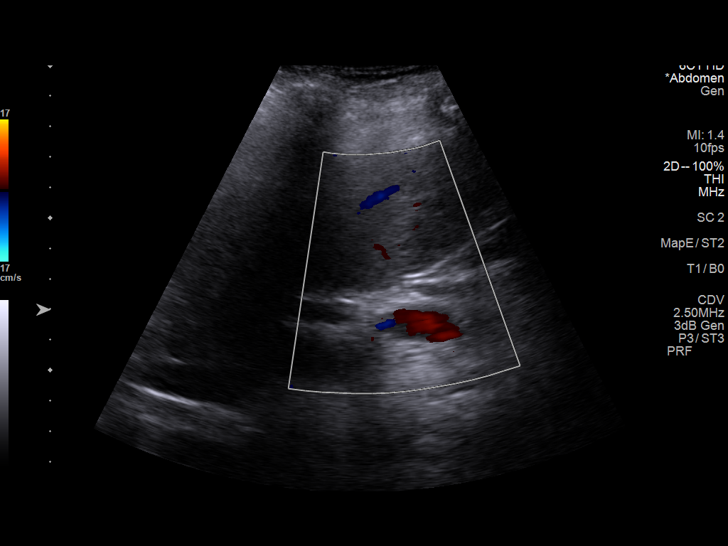
[im 22/87]
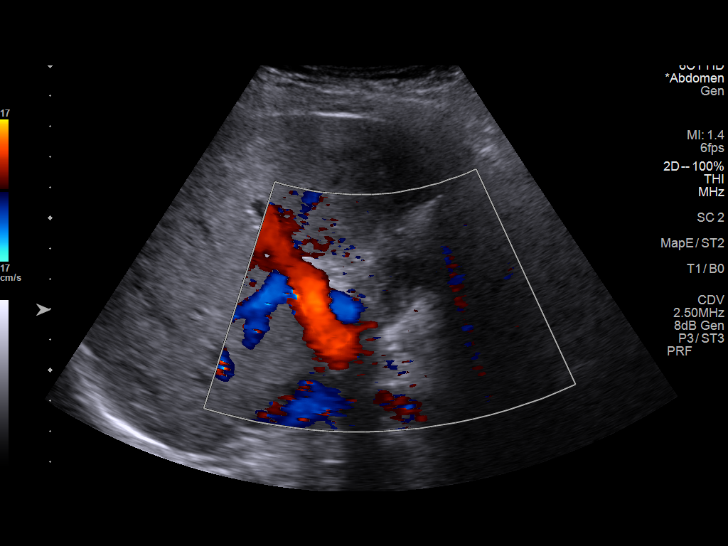
[im 29/87]
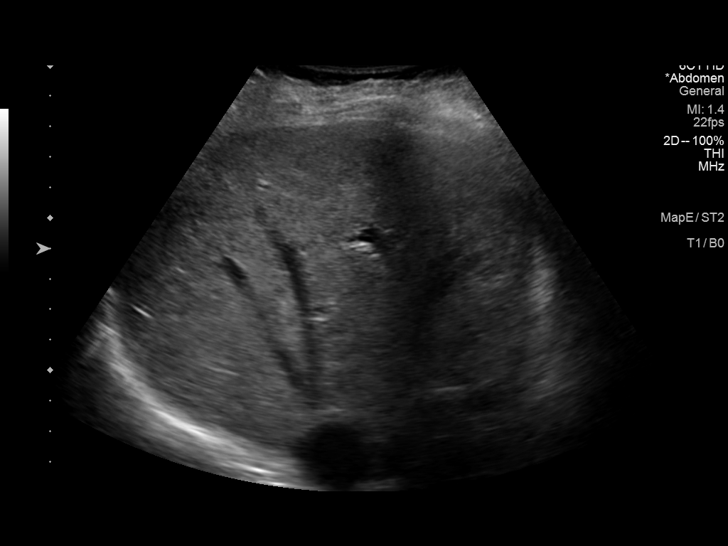
[im 33/87]
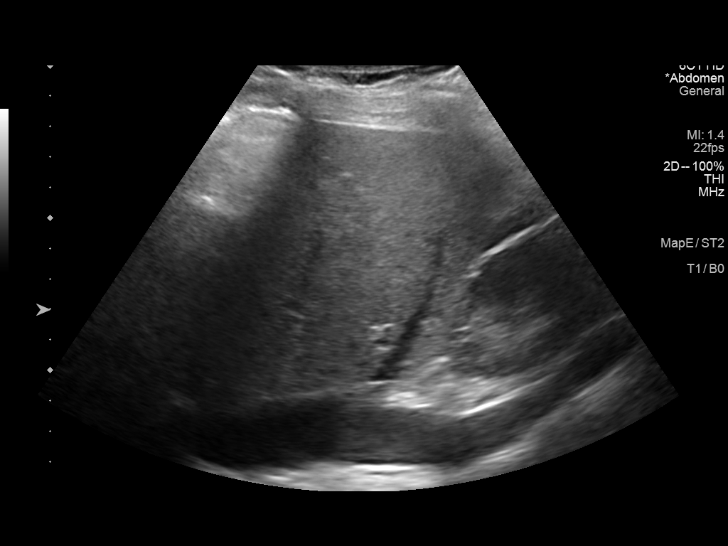
[im 40/87]
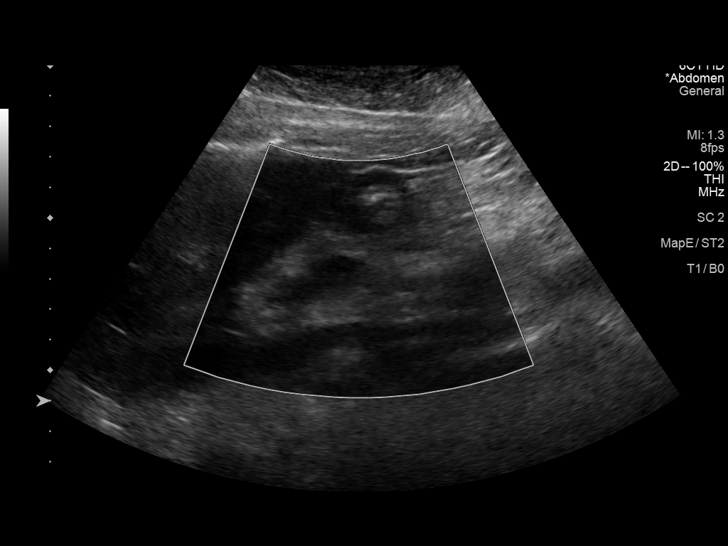
[im 47/87]
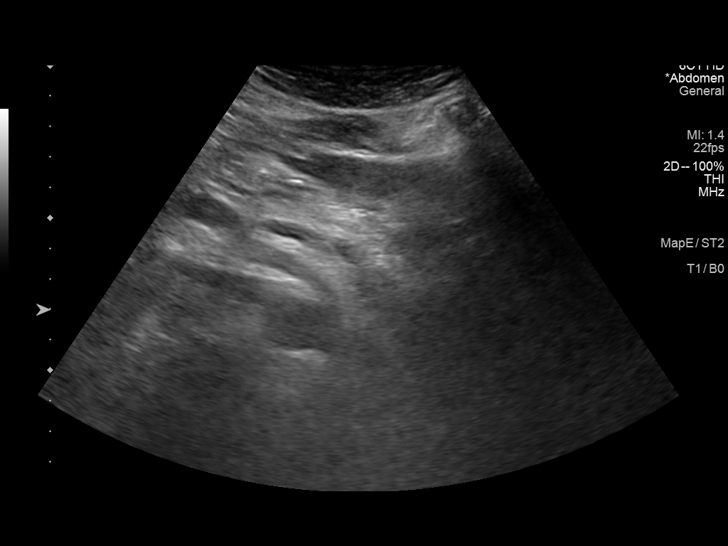
[im 54/87]
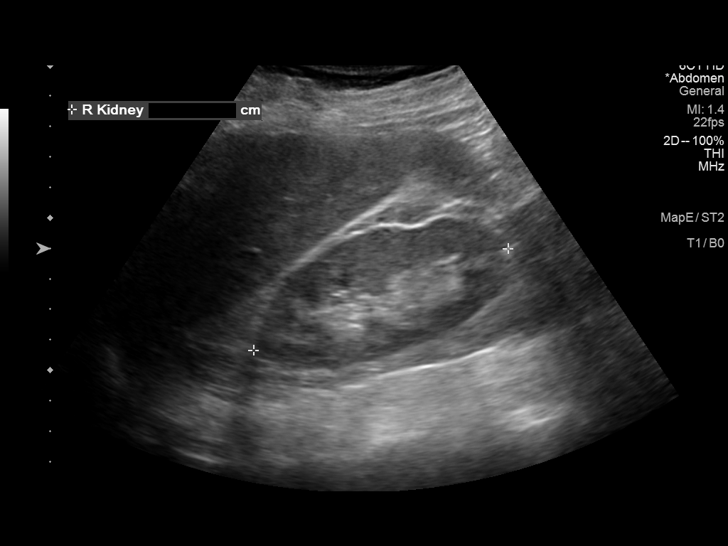
[im 58/87]
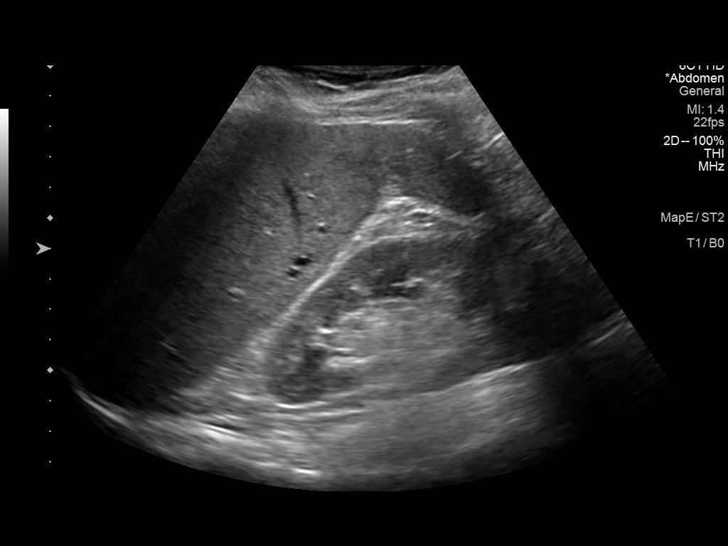
[im 65/87]
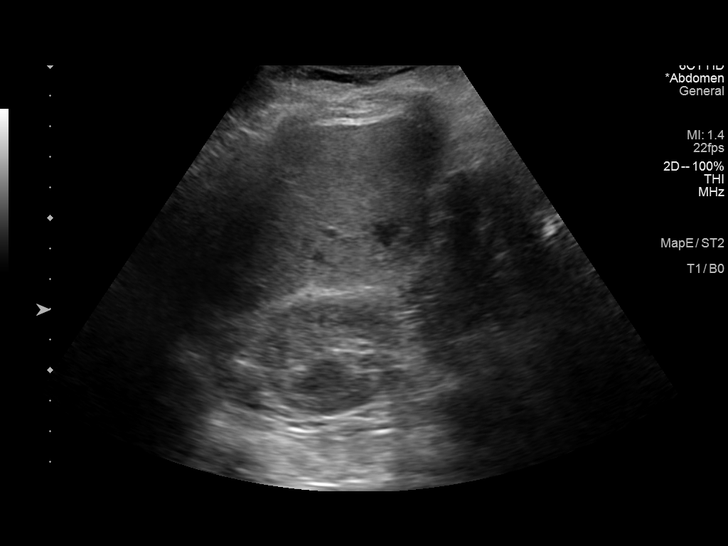
[im 72/87]
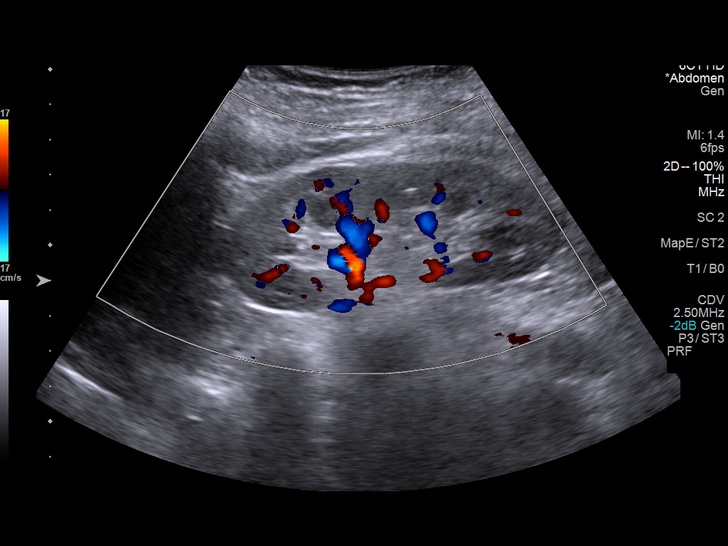
[im 79/87]
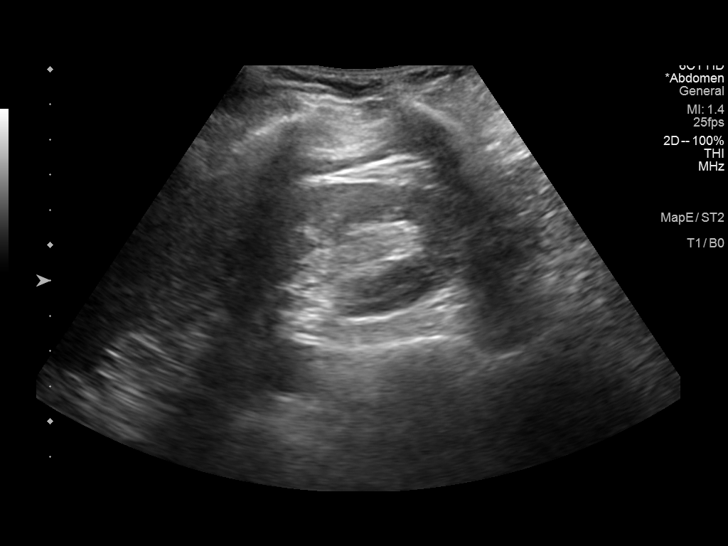
[im 87/87]
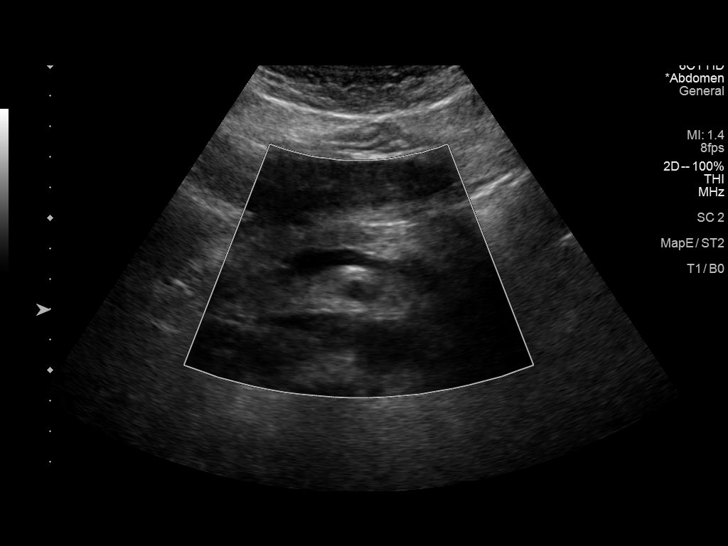

[14 of 25 positions shown; findings below may reference images not displayed]

FINDINGS: Gallbladder: Gallbladder incompletely distended and filled by
numerous shadowing calculi. Largest discrete stone 12 mm diameter.
No wall thickening or pericholecystic fluid. No sonographic Murphy
sign.

Common bile duct: Diameter: 4 mm, normal

Liver: Normal echogenicity without mass or nodularity. Portal vein
is patent on color Doppler imaging with normal direction of blood
flow towards the liver.

IVC: Normal appearance

Pancreas: Normal appearance

Spleen: Normal appearance, 8.9 cm length

Right Kidney: Length: 10.6 cm. Normal morphology without mass or
hydronephrosis.

Left Kidney: Length: 9.7 cm. Normal morphology without mass or
hydronephrosis.

Abdominal aorta: Normal caliber

Other findings: No free fluid
IMPRESSION: Cholelithiasis without evidence acute cholecystitis or biliary
dilatation.

Remainder of exam unremarkable.

## 2022-07-02 ENCOUNTER — Ambulatory Visit (INDEPENDENT_AMBULATORY_CARE_PROVIDER_SITE_OTHER): Payer: Medicare Other | Admitting: Nurse Practitioner

## 2022-07-02 ENCOUNTER — Encounter: Payer: Self-pay | Admitting: Nurse Practitioner

## 2022-07-02 VITALS — BP 108/68 | HR 67 | Temp 98.2°F | Ht 65.0 in | Wt 166.0 lb

## 2022-07-02 DIAGNOSIS — R351 Nocturia: Secondary | ICD-10-CM

## 2022-07-02 DIAGNOSIS — G4700 Insomnia, unspecified: Secondary | ICD-10-CM

## 2022-07-02 DIAGNOSIS — R0683 Snoring: Secondary | ICD-10-CM | POA: Diagnosis not present

## 2022-07-02 DIAGNOSIS — M797 Fibromyalgia: Secondary | ICD-10-CM | POA: Diagnosis not present

## 2022-07-02 NOTE — Progress Notes (Signed)
I,Sheena H Holbrook,acting as a Neurosurgeon for Arnette Felts, FNP.,have documented all relevant documentation on the behalf of Arnette Felts, FNP,as directed by  Arnette Felts, FNP while in the presence of Arnette Felts, FNP.    Subjective:     Patient ID: [name] , female    DOB: [birth year] , 82 y.o.   MRN: [id number]   Chief Complaint  Patient presents with   Insomnia    HPI  Patient presents today for concern of insomnia. She was given magnesium but did not like the way it made her feel.  Patient states she has occasional nights where she has difficulty staying asleep, she does have to get up to urinate about twice per night. She stopped drinking water yesterday at 600p.  She also reports some restlessness. She denies difficult falling asleep. Sometimes she will get up early and sometimes it will be late.   She is no longer taking lyrica for the last 2 months   Insomnia Primary symptoms: no sleep disturbance, frequent awakening.   The onset quality is gradual. The problem is unchanged.     Past Medical History:  Diagnosis Date   Abnormality of gait 04/10/2015   Cervical spondylosis without myelopathy 07/18/2013   Common migraine with intractable migraine 04/10/2015   DJD (degenerative joint disease)    Fibromyalgia    Hiatal hernia    Migraine headache    Vitreomacular adhesion of right eye 03/08/2020     Family History  Problem Relation Age of Onset   CVA Mother    Stroke Father    Emphysema Father      Current Outpatient Medications:    acetaminophen (TYLENOL) 500 MG tablet, Take 1,000 mg by mouth every 6 (six) hours as needed., Disp: , Rfl:    amoxicillin (AMOXIL) 875 MG tablet, Take 1 tablet (875 mg total) by mouth 2 (two) times daily., Disp: 14 tablet, Rfl: 0   Cholecalciferol (VITAMIN D) 2000 units tablet, Take 2,000 Units by mouth daily., Disp: , Rfl:    clopidogrel (PLAVIX) 75 MG tablet, TAKE 1 TABLET BY MOUTH EVERY DAY, Disp: 90 tablet, Rfl: 1   fexofenadine  (ALLEGRA) 60 MG tablet, Take 60 mg by mouth 2 (two) times daily., Disp: , Rfl:    fluticasone (FLONASE) 50 MCG/ACT nasal spray, Place into both nostrils daily as needed for allergies or rhinitis., Disp: , Rfl:    Magnesium 250 MG TABS, Take 1 tablet by mouth with evening meal., Disp: 30 tablet, Rfl: 2   Polyethyl Glycol-Propyl Glycol (SYSTANE OP), Apply to eye. As needed, Disp: , Rfl:    polyethylene glycol (MIRALAX / GLYCOLAX) 17 g packet, Take 17 g by mouth daily., Disp: 14 each, Rfl: 0   pregabalin (LYRICA) 25 MG capsule, Take 1 capsule 3 times daily, Disp: 270 capsule, Rfl: 1   topiramate (TOPAMAX) 25 MG tablet, TAKE ONE TABLET IN THE MORNING AND 3 IN THE EVENING, Disp: 360 tablet, Rfl: 3   trimethoprim-polymyxin b (POLYTRIM) ophthalmic solution, SMARTSIG:In Eye(s), Disp: , Rfl:    vitamin C (ASCORBIC ACID) 250 MG tablet, Take 250 mg by mouth daily. , Disp: , Rfl:    Allergies  Allergen Reactions   Aspirin Anaphylaxis and Hives   Elavil [Amitriptyline] Anaphylaxis   Latex    Relafen [Nabumetone] Hives     Review of Systems  Psychiatric/Behavioral:  Negative for sleep disturbance. The patient has insomnia.   All other systems reviewed and are negative.    Today's Vitals  07/02/22 1144  BP: 108/68  Pulse: 67  Temp: 98.2 F (36.8 C)  TempSrc: Oral  SpO2: 98%  Weight: 166 lb (75.3 kg)  Height: 5\' 5"  (1.651 m)   Body mass index is 27.62 kg/m.   Objective:  Physical Exam Vitals reviewed.  Constitutional:      General: She is not in acute distress.    Appearance: Normal appearance.  Cardiovascular:     Rate and Rhythm: Normal rate and regular rhythm.     Pulses: Normal pulses.     Heart sounds: No murmur heard. Pulmonary:     Effort: Pulmonary effort is normal. No respiratory distress.     Breath sounds: Normal breath sounds.  Neurological:     General: No focal deficit present.     Mental Status: She is alert and oriented to person, place, and time.     Cranial  Nerves: No cranial nerve deficit.  Psychiatric:        Mood and Affect: Mood normal.        Behavior: Behavior normal.        Thought Content: Thought content normal.        Judgment: Judgment normal.         Assessment And Plan:     1. Insomnia, unspecified type Comments: Did not take magnesium and she is not interested in any other medications. - Ambulatory referral to Sleep Studies  2. Fibromyalgia Comments: Stable, no changes  3. Nocturia Comments: Advised to cut drinking off at 6p to see if improves. - Ambulatory referral to Sleep Studies  4. Snoring Comments: She would benefit from a sleep study. Will place order to see if patient is interested in having done. - Ambulatory referral to Sleep Studies     Patient was given opportunity to ask questions. Patient verbalized understanding of the plan and was able to repeat key elements of the plan. All questions were answered to their satisfaction.  Arnette Felts, FNP   I, Arnette Felts, FNP, have reviewed all documentation for this visit. The documentation on 07/02/22 for the exam, diagnosis, procedures, and orders are all accurate and complete.   IF YOU HAVE BEEN REFERRED TO A SPECIALIST, IT MAY TAKE 1-2 WEEKS TO SCHEDULE/PROCESS THE REFERRAL. IF YOU HAVE NOT HEARD FROM US/SPECIALIST IN TWO WEEKS, PLEASE GIVE [place] A CALL AT [phone].   THE PATIENT IS ENCOURAGED TO PRACTICE SOCIAL DISTANCING DUE TO THE COVID-19 PANDEMIC.

## 2022-07-09 ENCOUNTER — Encounter (INDEPENDENT_AMBULATORY_CARE_PROVIDER_SITE_OTHER): Payer: Medicare Other | Admitting: Ophthalmology

## 2022-07-09 DIAGNOSIS — H353121 Nonexudative age-related macular degeneration, left eye, early dry stage: Secondary | ICD-10-CM

## 2022-07-09 DIAGNOSIS — H43813 Vitreous degeneration, bilateral: Secondary | ICD-10-CM | POA: Diagnosis not present

## 2022-07-09 DIAGNOSIS — H353211 Exudative age-related macular degeneration, right eye, with active choroidal neovascularization: Secondary | ICD-10-CM | POA: Diagnosis not present

## 2022-07-19 ENCOUNTER — Other Ambulatory Visit: Payer: Self-pay | Admitting: Nurse Practitioner

## 2022-07-19 DIAGNOSIS — Z8673 Personal history of transient ischemic attack (TIA), and cerebral infarction without residual deficits: Secondary | ICD-10-CM

## 2022-07-23 ENCOUNTER — Other Ambulatory Visit: Payer: Self-pay | Admitting: Neurology

## 2022-08-06 ENCOUNTER — Encounter (INDEPENDENT_AMBULATORY_CARE_PROVIDER_SITE_OTHER): Payer: Medicare Other | Admitting: Ophthalmology

## 2022-08-06 ENCOUNTER — Encounter: Payer: Self-pay | Admitting: Neurology

## 2022-08-06 ENCOUNTER — Ambulatory Visit: Payer: Medicare Other | Admitting: Neurology

## 2022-08-06 VITALS — BP 116/62 | HR 67 | Ht 64.0 in | Wt 170.5 lb

## 2022-08-06 DIAGNOSIS — H43813 Vitreous degeneration, bilateral: Secondary | ICD-10-CM

## 2022-08-06 DIAGNOSIS — H353211 Exudative age-related macular degeneration, right eye, with active choroidal neovascularization: Secondary | ICD-10-CM | POA: Diagnosis not present

## 2022-08-06 DIAGNOSIS — H353121 Nonexudative age-related macular degeneration, left eye, early dry stage: Secondary | ICD-10-CM

## 2022-08-06 DIAGNOSIS — G43909 Migraine, unspecified, not intractable, without status migrainosus: Secondary | ICD-10-CM | POA: Insufficient documentation

## 2022-08-06 DIAGNOSIS — G43009 Migraine without aura, not intractable, without status migrainosus: Secondary | ICD-10-CM | POA: Diagnosis not present

## 2022-08-06 NOTE — Progress Notes (Signed)
PATIENT: [name] DOB: [birth year]  REASON FOR VISIT: follow up for headaches  HISTORY FROM: patient, alone PRIMARY NEUROLOGIST: Dr. Dalia Heading. [place]  HISTORY OF PRESENT ILLNESS: Today 08/06/22  Has been off Lyrica for 3 months when she ran out. Has been doing fine. She was taking for fibro. Remains on Topamax 25/75 for headaches. Has near daily mild headache, only 2-3 a month are migraine, will take tylenol and go to bed. Is pleased with her headache control. Is no longer driving due to eye issues. Has chronic insomnia, PCP has referred for sleep study. Right knee issues, wearing brace. Gets injections in her eyes due to macular degeneration, her eyes are dilated today.   07/31/21 SS: [name] is here today for follow-up. In August, admitted for severe sepsis with UTI E.Coli. CT head no acute problem, showed mild chronic SVD. Had flu like virus about 1 week ago with vomiting. Lives alone now. Does her own daily activities, drives a car, no falls. Uses quad cane. Remains on Topamax 25/75 mg daily, Lyrica 25 mg up to 3 times daily. Headaches triggered by loud noise, when she gets upset, allergies. Usually headaches are either right or left temple. Will lay down, take Tylenol and it helps.  Her right side aches from fibromyalgia chronically. Headaches are few times a month, overall feels under good control  Update 05/16/2020 SS: [name] is a 82 year old female with history of fibromyalgia and headaches.  She remains on Lyrica and Topamax.  Headaches well controlled, usually 2 a month, will take Tylenol and lay down with good benefit.  Has chronic right knee pain, low back pain.  Has good and bad days with fibromyalgia, worse with weather change.  Uses a quad cane, no falls of recent.  Her granddaughter lives with her.  She drives a car.  Indicates she does her own ADLs, housework, activities on her own.  Having CT head scan tomorrow, ordered by PCP, family was concerned with memory. She  does not think necessary.  Her Zoloft has been increased.  Has been started on B12 injections.  Is on Plavix.  Here today for evaluation unaccompanied.  Update 05/18/2019 SS: [name] is a 82 year old female with history of fibromyalgia and headache.  She remains on Lyrica 25 mg 3 times a day.  She is also taking Topamax.  She takes Tylenol with good benefit.  Headaches are so infrequent, she does not even keep track of them.  She sleeps well.  She has chronic right knee pain, that is bothersome.  She has low back pain, that may radiate down the right leg with her fibromyalgia.  Lyrica is helpful, some days, she may only take 25 mg twice a day, for bad days she will take 3 times a day.  She has not been as active as she usually is.  She has not had any falls.  She uses a quad cane.  She drives a car, her granddaughter lives with her.  She denies any new problems or concerns.  She presents today for evaluation unaccompanied.  HISTORY 11/16/2018 SS: [name] is a 82 year old female with history of fibromyalgia and headache.  She remains on Lyrica 25 mg 3 times a day and this is providing adequate management of her discomfort and pain.  She does report pain and swelling to her right knee for several months.  She says she will be going to see an orthopedic doctor when she returns from vacation.  She reports her  headaches have improved with the increased dose of Topamax.  She remains on Topamax 25 mg in the morning, 75 mg at bedtime.  She reports on average she may have 4 headaches a month.  She will take Tylenol and lie down with headache relief.  She reports her granddaughter currently lives with her.  She is able to perform all of her daily activities independently.  She does drive a car without difficulty.  She is tolerating her medications well.  She presents today for follow-up unaccompanied.   REVIEW OF SYSTEMS: Out of a complete 14 system review of symptoms, the patient complains only of the following  symptoms, and all other reviewed systems are negative.  See HPI  ALLERGIES: Allergies  Allergen Reactions   Aspirin Anaphylaxis and Hives   Elavil [Amitriptyline] Anaphylaxis   Latex    Relafen [Nabumetone] Hives   HOME MEDICATIONS: Outpatient Medications Prior to Visit  Medication Sig Dispense Refill   acetaminophen (TYLENOL) 500 MG tablet Take 1,000 mg by mouth every 6 (six) hours as needed.     amoxicillin (AMOXIL) 875 MG tablet Take 1 tablet (875 mg total) by mouth 2 (two) times daily. 14 tablet 0   Cholecalciferol (VITAMIN D) 2000 units tablet Take 2,000 Units by mouth daily.     clopidogrel (PLAVIX) 75 MG tablet TAKE 1 TABLET BY MOUTH EVERY DAY 90 tablet 1   fexofenadine (ALLEGRA) 60 MG tablet Take 60 mg by mouth 2 (two) times daily.     fluticasone (FLONASE) 50 MCG/ACT nasal spray Place into both nostrils daily as needed for allergies or rhinitis.     Magnesium 250 MG TABS Take 1 tablet by mouth with evening meal. 30 tablet 2   Polyethyl Glycol-Propyl Glycol (SYSTANE OP) Apply to eye. As needed     polyethylene glycol (MIRALAX / GLYCOLAX) 17 g packet Take 17 g by mouth daily. 14 each 0   topiramate (TOPAMAX) 25 MG tablet TAKE ONE TABLET IN THE MORNING AND 3 IN THE EVENING 360 tablet 3   trimethoprim-polymyxin b (POLYTRIM) ophthalmic solution SMARTSIG:In Eye(s)     vitamin C (ASCORBIC ACID) 250 MG tablet Take 250 mg by mouth daily.      pregabalin (LYRICA) 25 MG capsule Take 1 capsule 3 times daily (Patient not taking: Reported on 08/06/2022) 270 capsule 1   No facility-administered medications prior to visit.    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/08/2020    PAST SURGICAL  HISTORY: Past Surgical History:  Procedure Laterality Date   ABDOMINAL HYSTERECTOMY     DILATION AND CURETTAGE OF UTERUS     HIATAL HERNIA REPAIR     TONSILLECTOMY      FAMILY HISTORY: Family History  Problem Relation Age of Onset   CVA Mother    Stroke Father    Emphysema Father     SOCIAL HISTORY: Social History   Socioeconomic History   Marital status: Widowed    Spouse name: Not on file   Number of children: 5   Years of education: college   Highest education level: Not on file  Occupational History   Occupation: Retired  Tobacco Use   Smoking status: Never   Smokeless tobacco:  Never  Vaping Use   Vaping Use: Never used  Substance and Sexual Activity   Alcohol use: No    Comment: very occasional   Drug use: No   Sexual activity: Not Currently  Other Topics Concern   Not on file  Social History Narrative   Patient is married with 5 children.   Patient is right handed.   Patient has college education.   Patient drinks 3 cups daily.         Social Determinants of Health   Financial Resource Strain: Low Risk  (10/10/2021)   Overall Financial Resource Strain (CARDIA)    Difficulty of Paying Living Expenses: Not hard at all  Food Insecurity: Food Insecurity Present (10/10/2021)   Hunger Vital Sign    Worried About Running Out of Food in the Last Year: Often true    Ran Out of Food in the Last Year: Often true  Transportation Needs: No Transportation Needs (10/10/2021)   PRAPARE - Administrator, Civil Service (Medical): No    Lack of Transportation (Non-Medical): No  Physical Activity: Inactive (10/10/2021)   Exercise Vital Sign    Days of Exercise per Week: 0 days    Minutes of Exercise per Session: 0 min  Stress: No Stress Concern Present (10/10/2021)   [employer] of Occupational Health - Occupational Stress Questionnaire    Feeling of Stress : Not at all  Social Connections: Not on file  Intimate Partner Violence: Not At Risk (08/24/2018)    Humiliation, Afraid, Rape, and Kick questionnaire    Fear of Current or Ex-Partner: No    Emotionally Abused: No    Physically Abused: No    Sexually Abused: No   PHYSICAL EXAM  Vitals:   08/06/22 1300  BP: 116/62  Pulse: 67  Weight: 170 lb 8 oz (77.3 kg)  Height: 5\' 4"  (1.626 m)   Body mass index is 29.27 kg/m.  Generalized: Well developed, in no acute distress   Neurological examination  Mentation: Alert oriented to time, place, history taking. Follows all commands speech and language fluent, very nice Cranial nerve II-XII: Pupils were equal round reactive to light. Extraocular movements were full, visual field were full on confrontational test. Facial sensation and strength were normal. Head turning and shoulder shrug were normal and symmetric. Motor: Good strength of all extremities, mild right hip flexion weakness Sensory: Sensory testing is intact to soft touch on all 4 extremities. No evidence of extinction is noted.  Coordination: Cerebellar testing reveals good finger-nose-finger and heel-to-shin bilaterally.  Gait and station: Able to rise from seated position without pushoff, gait is normal, steady, has a quad cane to use. Reflexes: Deep tendon reflexes are symmetric   DIAGNOSTIC DATA (LABS, IMAGING, TESTING) - I reviewed patient records, labs, notes, testing and imaging myself where available.  Lab Results  Component Value Date   WBC 5.8 05/08/2022   HGB 14.6 05/08/2022   HCT 44.0 05/08/2022   MCV 92 05/08/2022   PLT 226 05/08/2022      Component Value Date/Time   NA 142 06/12/2022 1423   K 4.5 06/12/2022 1423   CL 103 06/12/2022 1423   CO2 26 06/12/2022 1423   GLUCOSE 169 (H) 06/12/2022 1423   GLUCOSE 104 (H) 10/24/2020 0021   BUN 12 06/12/2022 1423   CREATININE 0.90 06/12/2022 1423   CALCIUM 9.6 06/12/2022 1423   PROT 5.9 (L) 06/12/2022 1423   ALBUMIN 4.2 06/12/2022 1423   AST 17 06/12/2022 1423  ALT 14 06/12/2022 1423   ALKPHOS 91 06/12/2022  1423   BILITOT 0.3 06/12/2022 1423   GFRNONAA >60 10/24/2020 0021   GFRAA 77 05/01/2020 1115   Lab Results  Component Value Date   CHOL 204 (H) 05/08/2022   HDL 64 05/08/2022   LDLCALC 109 (H) 05/08/2022   TRIG 183 (H) 05/08/2022   CHOLHDL 3.2 05/08/2022   Lab Results  Component Value Date   HGBA1C 5.7 (H) 05/08/2022   Lab Results  Component Value Date   VITAMINB12 472 11/06/2021    ASSESSMENT AND PLAN 82 y.o. year old female  1.  Fibromyalgia 2.  Migraine headache  -Headaches are stable, she does not wish to increase her Topamax, will continue 25/75 mg daily for headache prevention, long term stable dosing -She ran out of Lyrica 3 months ago, has noticed no worsening since stopping, will remain off -I just refilled her Topamax, she can keep close follow-up with PCP who can continue to refill Topamax, return here for new symptoms  Otila Kluver, DNP 08/06/2022, 1:31 PM [facility] [address], [address] [place], [place] [address] [phone]

## 2022-08-23 IMAGING — CR DG CHEST 2V
2 series · 2 of 2 positions shown · non-contrast
Comparison: None.

CLINICAL DATA: Cough, shortness of breath

EXAM:
CHEST - 2 VIEW

[w chest pa]
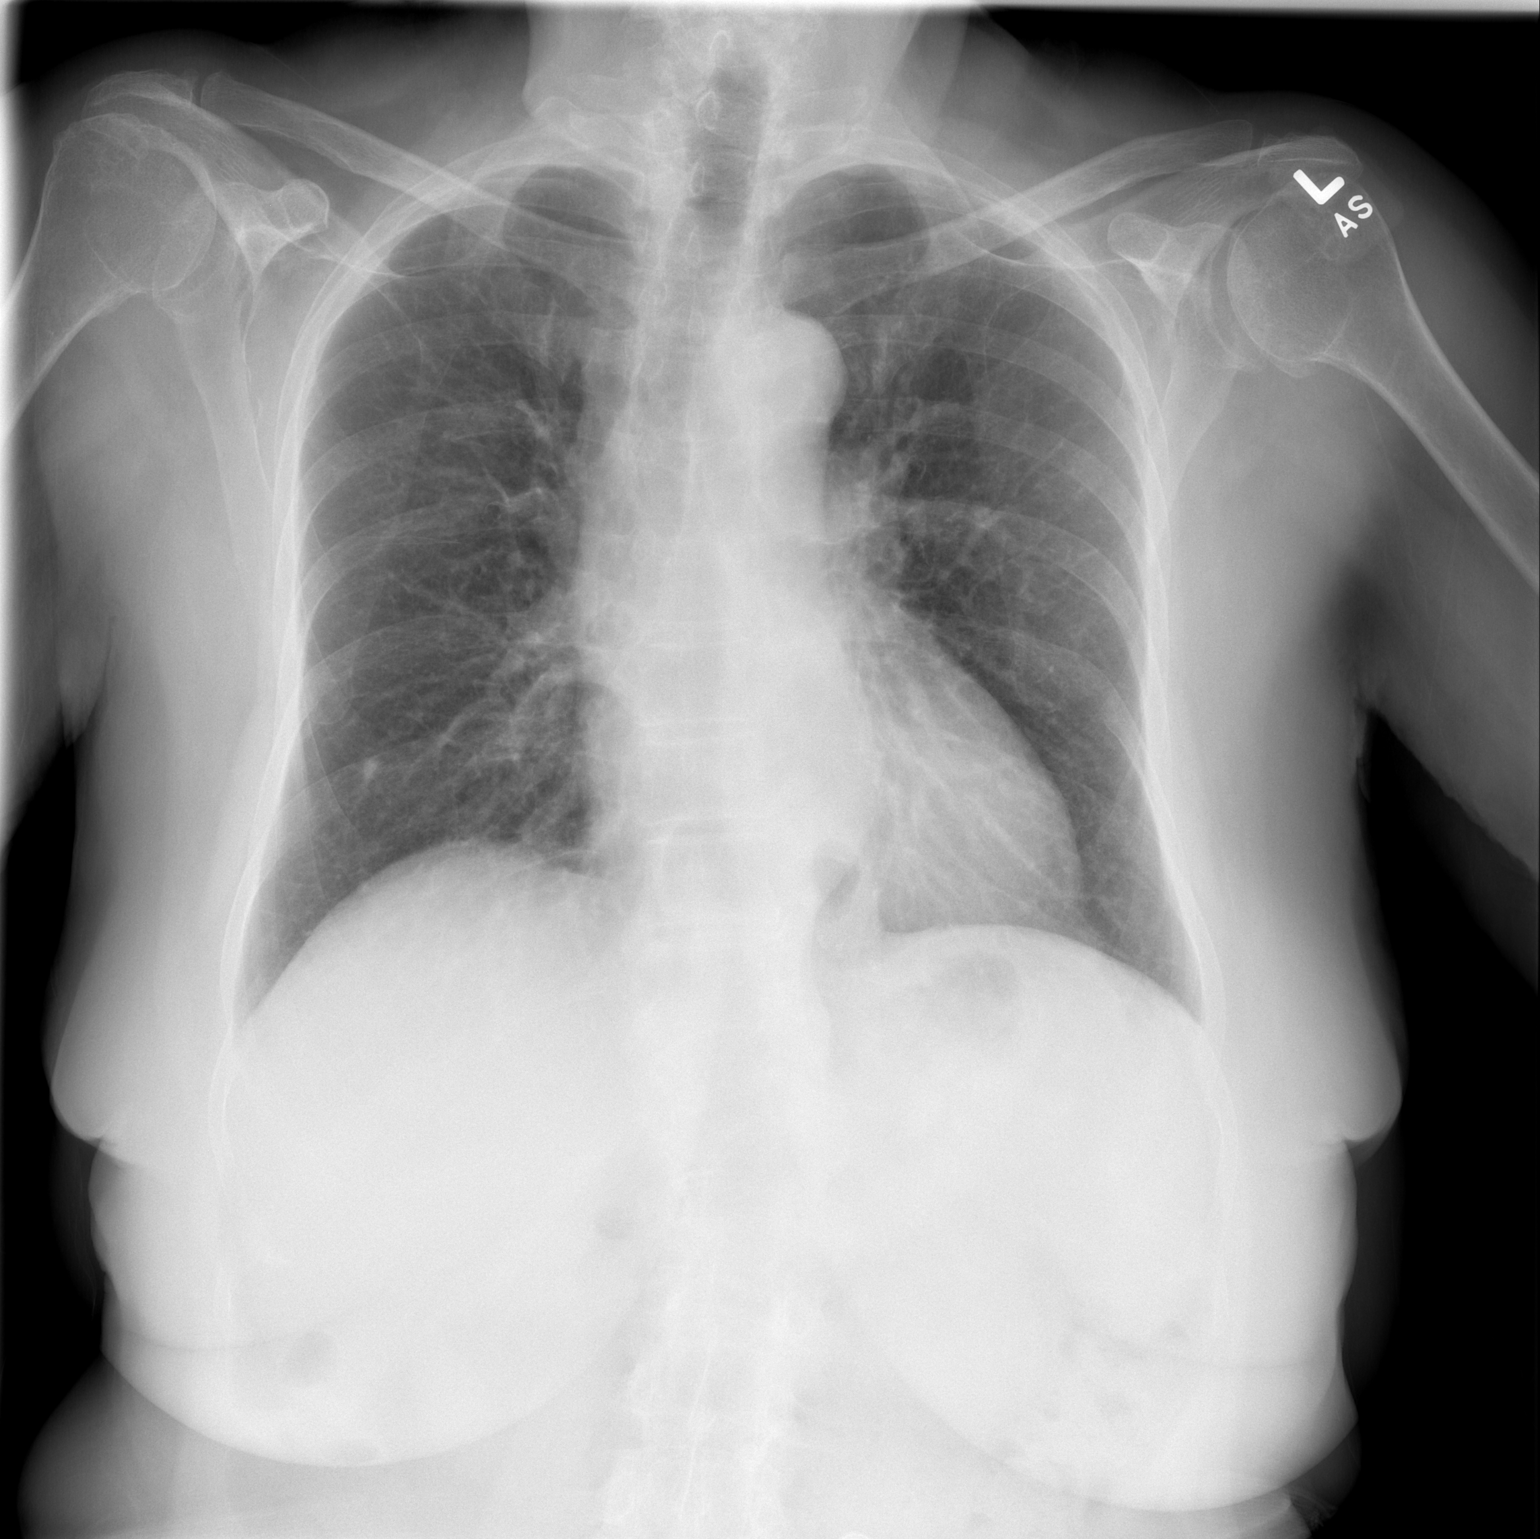

[w chest lat]
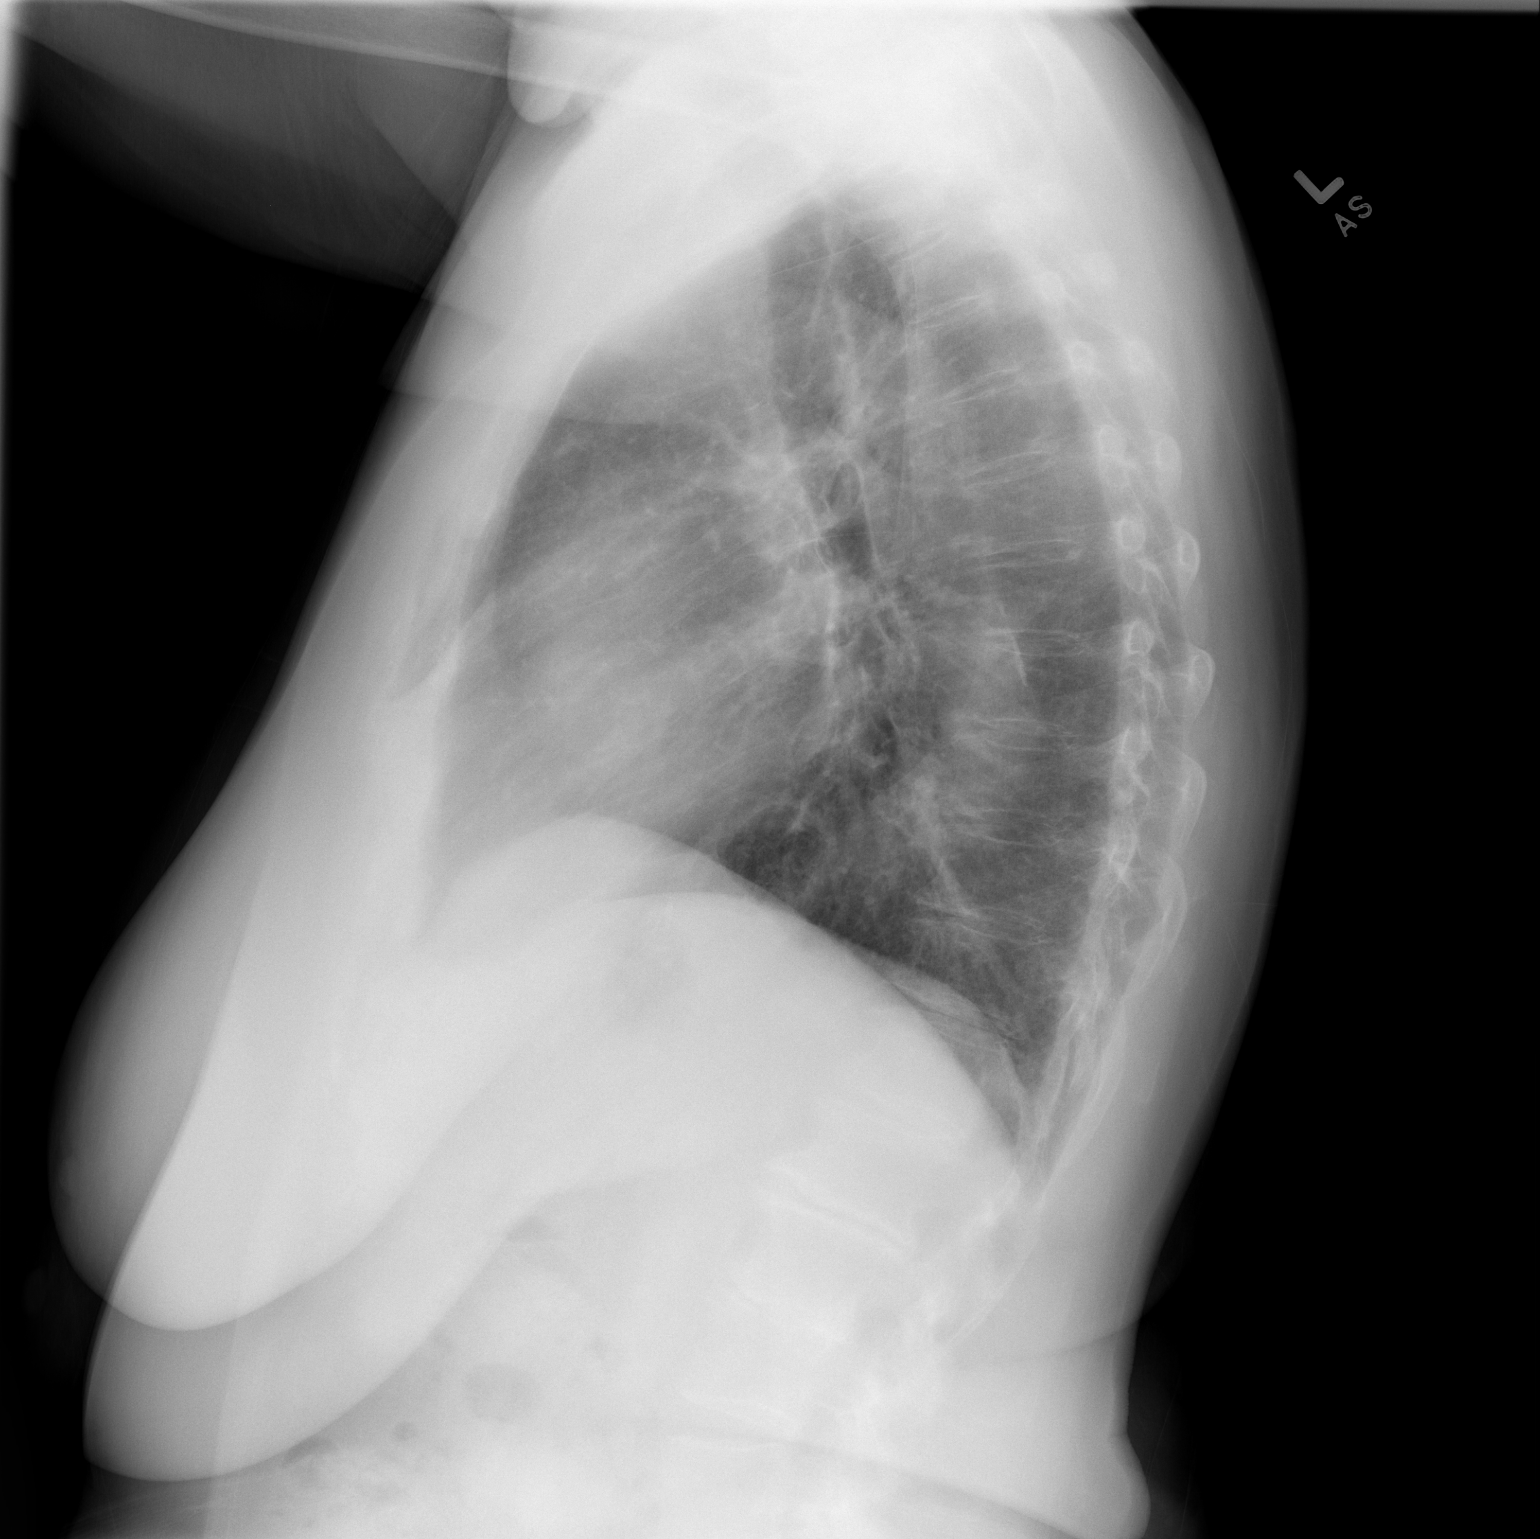

[2 of 2 positions shown; findings below may reference images not displayed]

FINDINGS: Heart is normal size. Small hiatal hernia. No confluent opacities or
effusions. No acute bony abnormality.
IMPRESSION: No active cardiopulmonary disease.

## 2022-09-03 ENCOUNTER — Encounter (INDEPENDENT_AMBULATORY_CARE_PROVIDER_SITE_OTHER): Payer: Medicare Other | Admitting: Ophthalmology

## 2022-09-03 DIAGNOSIS — H353211 Exudative age-related macular degeneration, right eye, with active choroidal neovascularization: Secondary | ICD-10-CM

## 2022-09-03 DIAGNOSIS — H353121 Nonexudative age-related macular degeneration, left eye, early dry stage: Secondary | ICD-10-CM | POA: Diagnosis not present

## 2022-09-03 DIAGNOSIS — H43813 Vitreous degeneration, bilateral: Secondary | ICD-10-CM

## 2022-09-18 ENCOUNTER — Telehealth: Payer: Self-pay

## 2022-09-18 NOTE — Telephone Encounter (Signed)
Patients daughter ms. Chad called stating over the past weekend she had some vomiting patient is scheduled for Monday. Patients daughter was advised she should go to urgent care or er if worsens before Monday.

## 2022-09-22 ENCOUNTER — Encounter: Payer: Self-pay | Admitting: Nurse Practitioner

## 2022-09-22 ENCOUNTER — Ambulatory Visit: Payer: Medicare Other | Admitting: Nurse Practitioner

## 2022-09-22 VITALS — BP 110/70 | HR 75 | Temp 98.0°F | Ht 64.0 in | Wt 163.6 lb

## 2022-09-22 DIAGNOSIS — R82998 Other abnormal findings in urine: Secondary | ICD-10-CM | POA: Diagnosis not present

## 2022-09-22 DIAGNOSIS — N3000 Acute cystitis without hematuria: Secondary | ICD-10-CM

## 2022-09-22 DIAGNOSIS — Z2821 Immunization not carried out because of patient refusal: Secondary | ICD-10-CM

## 2022-09-22 DIAGNOSIS — Z6828 Body mass index (BMI) 28.0-28.9, adult: Secondary | ICD-10-CM

## 2022-09-22 DIAGNOSIS — R1319 Other dysphagia: Secondary | ICD-10-CM

## 2022-09-22 DIAGNOSIS — R112 Nausea with vomiting, unspecified: Secondary | ICD-10-CM

## 2022-09-22 DIAGNOSIS — R1032 Left lower quadrant pain: Secondary | ICD-10-CM

## 2022-09-22 DIAGNOSIS — E663 Overweight: Secondary | ICD-10-CM

## 2022-09-22 LAB — POCT URINALYSIS DIP (CLINITEK)
Bilirubin, UA: NEGATIVE
Glucose, UA: NEGATIVE mg/dL
Ketones, POC UA: NEGATIVE mg/dL
Nitrite, UA: POSITIVE — AB
POC PROTEIN,UA: 30 — AB
Spec Grav, UA: >=1.03 — AB (ref 1.010–1.025)
Urobilinogen, UA: 0.2 E.U./dL
pH, UA: 6 (ref 5.0–8.0)

## 2022-09-22 MED ORDER — ONDANSETRON HCL 4 MG PO TABS: 4.0000 mg | ORAL_TABLET | Freq: Every day | ORAL | 1 refills | Status: AC | PRN

## 2022-09-22 MED ORDER — NITROFURANTOIN MONOHYD MACRO 100 MG PO CAPS
100.0000 mg | ORAL_CAPSULE | Freq: Two times a day (BID) | ORAL | 0 refills | Status: AC
Start: 2022-09-22 — End: 2022-09-27

## 2022-09-22 NOTE — Progress Notes (Signed)
[name], CMA,acting as a Neurosurgeon for Mary Felts, FNP.,have documented all relevant documentation on the behalf of Mary Felts, FNP,as directed by  Mary Felts, FNP while in the presence of Mary Felts, FNP.  Subjective:  Patient ID: [name] , female    DOB: 10/17/1940 , 82 y.o.   MRN: [id number]  Chief Complaint  Patient presents with   Emesis    HPI  Patient presents today for vomiting patient reports on 09/17/2022 she threw up, patient reports hard stools. Patient reports thinking it was due to her hernia, patient reports she's been eating light and hasn't thrown up again since. She was not able to eat any meats she would try to "upchuck it or would get hung in her throat". She is also having pain on her left side of abdomen and abdomen has been "tight". She has not had a bowel. She had been having diarrhea on Monday and Tuesday. She had taken [facility] brand Pepto bismol. Patient reports she had a bowel movement in 2 days ago.  Patient presents with her daughter [name].  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     Family History  Problem Relation Age of Onset   CVA Mother    Stroke Father    Emphysema Father      Current Outpatient Medications:    acetaminophen (TYLENOL) 500 MG tablet, Take 1,000 mg by mouth every 6 (six) hours as needed., Disp: , Rfl:    amoxicillin (AMOXIL) 875 MG tablet, Take 1 tablet (875 mg total) by mouth 2 (two) times daily., Disp: 14 tablet, Rfl: 0   Cholecalciferol (VITAMIN D)  2000 units tablet, Take 2,000 Units by mouth daily., Disp: , Rfl:    clopidogrel (PLAVIX) 75 MG tablet, TAKE 1 TABLET BY MOUTH EVERY DAY, Disp: 90 tablet, Rfl: 1   fexofenadine (ALLEGRA) 60 MG tablet, Take 60 mg by mouth 2 (two) times daily., Disp: , Rfl:    fluticasone (FLONASE) 50 MCG/ACT nasal spray, Place into both nostrils daily as needed for allergies or rhinitis., Disp: , Rfl:    Magnesium 250 MG TABS, Take 1 tablet by mouth with evening meal., Disp: 30 tablet, Rfl: 2   ondansetron (ZOFRAN) 4 MG tablet, Take 1 tablet (4 mg total) by mouth daily as needed for nausea or vomiting., Disp: 30 tablet, Rfl: 1   Polyethyl Glycol-Propyl Glycol (SYSTANE OP), Apply to eye. As needed, Disp: , Rfl:    topiramate (TOPAMAX) 25 MG tablet, TAKE ONE TABLET IN THE MORNING AND 3 IN THE EVENING, Disp: 360 tablet, Rfl: 3   trimethoprim-polymyxin b (POLYTRIM) ophthalmic solution, SMARTSIG:In Eye(s), Disp: , Rfl:    vitamin C (ASCORBIC ACID) 250 MG tablet, Take 250 mg by mouth daily. , Disp: , Rfl:    polyethylene glycol (MIRALAX / GLYCOLAX) 17 g packet, Take 17 g by mouth daily. (Patient not taking: Reported on 09/22/2022), Disp: 14 each, Rfl: 0   Allergies  Allergen Reactions   Aspirin Anaphylaxis and Hives   Elavil [  Amitriptyline] Anaphylaxis   Latex    Relafen [Nabumetone] Hives     Review of Systems  Constitutional: Negative.  Negative for fatigue.  HENT:  Positive for sneezing. Negative for rhinorrhea.   Eyes: Negative.   Respiratory: Negative.    Cardiovascular: Negative.   Gastrointestinal:  Positive for constipation, nausea and vomiting. Negative for abdominal distention and diarrhea.  Endocrine: Negative.   Genitourinary: Negative.   Skin: Negative.   Allergic/Immunologic: Negative.   Neurological: Negative.   Hematological: Negative.   Psychiatric/Behavioral:  Negative for decreased concentration.   All other systems reviewed and are negative.    Today's Vitals   09/22/22 0854  BP:  110/70  Pulse: 75  Temp: 98 F (36.7 C)  TempSrc: Oral  Weight: 163 lb 9.6 oz (74.2 kg)  Height: 5\' 4"  (1.626 m)  PainSc: 5   PainLoc: Abdomen   Body mass index is 28.08 kg/m.  Wt Readings from Last 3 Encounters:  09/22/22 163 lb 9.6 oz (74.2 kg)  08/06/22 170 lb 8 oz (77.3 kg)  07/02/22 166 lb (75.3 kg)      Objective:  Physical Exam Vitals reviewed.  Constitutional:      General: She is not in acute distress.    Appearance: Normal appearance.  Cardiovascular:     Rate and Rhythm: Normal rate and regular rhythm.     Pulses: Normal pulses.     Heart sounds: No murmur heard. Pulmonary:     Effort: Pulmonary effort is normal. No respiratory distress.     Breath sounds: Normal breath sounds. No wheezing.  Abdominal:     General: Abdomen is flat. Bowel sounds are normal. There is no distension.     Palpations: Abdomen is soft.     Tenderness: There is no abdominal tenderness.  Neurological:     General: No focal deficit present.     Mental Status: She is alert and oriented to person, place, and time.     Cranial Nerves: No cranial nerve deficit.  Psychiatric:        Mood and Affect: Mood normal.        Behavior: Behavior normal.        Thought Content: Thought content normal.        Judgment: Judgment normal.         Assessment And Plan:  Nausea and vomiting, unspecified vomiting type Assessment & Plan: Will check labs. Given Rx for zofran as needed. Symptoms seem to be improving but are still present  Orders: -     Ondansetron HCl; Take 1 tablet (4 mg total) by mouth daily as needed for nausea or vomiting.  Dispense: 30 tablet; Refill: 1 -     CMP14+EGFR -     Amylase -     Lipase -     CBC with Differential/Platelet -     CT ABDOMEN WO CONTRAST; Future  Herpes zoster vaccination declined Assessment & Plan: Declines shingrix, educated on disease process and is aware if he changes his mind to notify office    Overweight with body mass index (BMI) of 28  to 28.9 in adult  Esophageal dysphagia Assessment & Plan: She is having difficulty with swallowing and feels like her food is getting stuck, will refer to GI for further evaluation.   Orders: -     Ambulatory referral to Gastroenterology  Left lower quadrant abdominal pain Assessment & Plan: Tenderness on palpation. Will check CT abdomen. Urinalysis does show positive nitrates, will treat for UTI  and send for culture  Orders: -     Ambulatory referral to Gastroenterology -     POCT URINALYSIS DIP (CLINITEK) -     CT ABDOMEN WO CONTRAST; Future  Acute cystitis without hematuria -     Urine Culture -     Nitrofurantoin Monohyd Macro; Take 1 capsule (100 mg total) by mouth 2 (two) times daily for 5 days.  Dispense: 10 capsule; Refill: 0    Return if symptoms worsen or fail to improve.   Patient was given opportunity to ask questions. Patient verbalized understanding of the plan and was able to repeat key elements of the plan. All questions were answered to their satisfaction.    Jeanell Sparrow, FNP, have reviewed all documentation for this visit. The documentation on 09/22/22 for the exam, diagnosis, procedures, and orders are all accurate and complete.   IF YOU HAVE BEEN REFERRED TO A SPECIALIST, IT MAY TAKE 1-2 WEEKS TO SCHEDULE/PROCESS THE REFERRAL. IF YOU HAVE NOT HEARD FROM US/SPECIALIST IN TWO WEEKS, PLEASE GIVE [place] A CALL AT [phone].

## 2022-09-23 LAB — CMP14+EGFR
ALT: 9 IU/L (ref 0–32)
AST: 12 IU/L (ref 0–40)
Albumin: 4.5 g/dL (ref 3.8–4.8)
Alkaline Phosphatase: 88 IU/L (ref 44–121)
BUN/Creatinine Ratio: 16 (ref 12–28)
BUN: 18 mg/dL (ref 8–27)
Bilirubin Total: 0.4 mg/dL (ref 0.0–1.2)
CO2: 23 mmol/L (ref 20–29)
Calcium: 10.1 mg/dL (ref 8.7–10.3)
Chloride: 101 mmol/L (ref 96–106)
Creatinine, Ser: 1.11 mg/dL — ABNORMAL HIGH (ref 0.57–1.00)
Globulin, Total: 2.3 g/dL (ref 1.5–4.5)
Glucose: 96 mg/dL (ref 70–99)
Potassium: 4.5 mmol/L (ref 3.5–5.2)
Sodium: 143 mmol/L (ref 134–144)
Total Protein: 6.8 g/dL (ref 6.0–8.5)
eGFR: 50 mL/min/{1.73_m2} — ABNORMAL LOW (ref >59)

## 2022-09-23 LAB — CBC WITH DIFFERENTIAL/PLATELET
Basophils Absolute: 0 10*3/uL (ref 0.0–0.2)
Basos: 1 %
EOS (ABSOLUTE): 0.2 10*3/uL (ref 0.0–0.4)
Eos: 2 %
Hematocrit: 44.8 % (ref 34.0–46.6)
Hemoglobin: 14.9 g/dL (ref 11.1–15.9)
Immature Grans (Abs): 0 10*3/uL (ref 0.0–0.1)
Immature Granulocytes: 0 %
Lymphocytes Absolute: 1.1 10*3/uL (ref 0.7–3.1)
Lymphs: 13 %
MCH: 31.2 pg (ref 26.6–33.0)
MCHC: 33.3 g/dL (ref 31.5–35.7)
MCV: 94 fL (ref 79–97)
Monocytes Absolute: 0.6 10*3/uL (ref 0.1–0.9)
Monocytes: 7 %
Neutrophils Absolute: 6.2 10*3/uL (ref 1.4–7.0)
Neutrophils: 77 %
Platelets: 283 10*3/uL (ref 150–450)
RBC: 4.77 x10E6/uL (ref 3.77–5.28)
RDW: 12.7 % (ref 11.7–15.4)
WBC: 8.1 10*3/uL (ref 3.4–10.8)

## 2022-09-23 LAB — LIPASE: Lipase: 30 U/L (ref 14–85)

## 2022-09-23 LAB — AMYLASE: Amylase: 48 U/L (ref 31–110)

## 2022-09-24 LAB — URINE CULTURE

## 2022-09-30 ENCOUNTER — Ambulatory Visit
Admission: RE | Admit: 2022-09-30 | Discharge: 2022-09-30 | Disposition: A | Discharge status: Home / Self Care | Payer: Medicare Other | Source: Ambulatory Visit | Attending: Nurse Practitioner | Admitting: Nurse Practitioner

## 2022-09-30 DIAGNOSIS — R109 Unspecified abdominal pain: Secondary | ICD-10-CM | POA: Diagnosis not present

## 2022-09-30 DIAGNOSIS — K5730 Diverticulosis of large intestine without perforation or abscess without bleeding: Secondary | ICD-10-CM | POA: Diagnosis not present

## 2022-09-30 DIAGNOSIS — I700 Atherosclerosis of aorta: Secondary | ICD-10-CM | POA: Diagnosis not present

## 2022-09-30 DIAGNOSIS — R112 Nausea with vomiting, unspecified: Secondary | ICD-10-CM

## 2022-09-30 DIAGNOSIS — R1032 Left lower quadrant pain: Secondary | ICD-10-CM

## 2022-09-30 DIAGNOSIS — K449 Diaphragmatic hernia without obstruction or gangrene: Secondary | ICD-10-CM | POA: Diagnosis not present

## 2022-10-01 ENCOUNTER — Encounter (INDEPENDENT_AMBULATORY_CARE_PROVIDER_SITE_OTHER): Payer: Medicare Other | Admitting: Ophthalmology

## 2022-10-01 DIAGNOSIS — H353211 Exudative age-related macular degeneration, right eye, with active choroidal neovascularization: Secondary | ICD-10-CM | POA: Diagnosis not present

## 2022-10-01 DIAGNOSIS — H43813 Vitreous degeneration, bilateral: Secondary | ICD-10-CM | POA: Diagnosis not present

## 2022-10-01 DIAGNOSIS — H353121 Nonexudative age-related macular degeneration, left eye, early dry stage: Secondary | ICD-10-CM | POA: Diagnosis not present

## 2022-10-06 ENCOUNTER — Encounter: Payer: Self-pay | Admitting: Nurse Practitioner

## 2022-10-06 DIAGNOSIS — R112 Nausea with vomiting, unspecified: Secondary | ICD-10-CM | POA: Insufficient documentation

## 2022-10-06 DIAGNOSIS — Z2821 Immunization not carried out because of patient refusal: Secondary | ICD-10-CM | POA: Insufficient documentation

## 2022-10-06 DIAGNOSIS — N3000 Acute cystitis without hematuria: Secondary | ICD-10-CM | POA: Insufficient documentation

## 2022-10-06 DIAGNOSIS — R1032 Left lower quadrant pain: Secondary | ICD-10-CM | POA: Insufficient documentation

## 2022-10-06 DIAGNOSIS — Z6828 Body mass index (BMI) 28.0-28.9, adult: Secondary | ICD-10-CM | POA: Insufficient documentation

## 2022-10-06 NOTE — Assessment & Plan Note (Signed)
She is having difficulty with swallowing and feels like her food is getting stuck, will refer to GI for further evaluation.

## 2022-10-06 NOTE — Assessment & Plan Note (Signed)
Declines shingrix, educated on disease process and is aware if he changes his mind to notify office  

## 2022-10-06 NOTE — Assessment & Plan Note (Signed)
Tenderness on palpation. Will check CT abdomen. Urinalysis does show positive nitrates, will treat for UTI and send for culture

## 2022-10-06 NOTE — Assessment & Plan Note (Signed)
Will check labs. Given Rx for zofran as needed. Symptoms seem to be improving but are still present

## 2022-10-23 ENCOUNTER — Ambulatory Visit (INDEPENDENT_AMBULATORY_CARE_PROVIDER_SITE_OTHER): Payer: Medicare Other

## 2022-10-23 ENCOUNTER — Ambulatory Visit: Payer: Medicare Other | Admitting: Nurse Practitioner

## 2022-10-23 VITALS — BP 116/70 | HR 89 | Temp 98.1°F | Ht 62.2 in | Wt 162.4 lb

## 2022-10-23 DIAGNOSIS — Z0000 Encounter for general adult medical examination without abnormal findings: Secondary | ICD-10-CM

## 2022-10-23 NOTE — Progress Notes (Signed)
Subjective:   [name] is a 82 y.o. female who presents for Medicare Annual (Subsequent) preventive examination.  Visit Complete: In person    Review of Systems     Cardiac Risk Factors include: advanced age (>25men, >61 women)     Objective:    Today's Vitals   10/23/22 1447  BP: 116/70  Pulse: 89  Temp: 98.1 F (36.7 C)  TempSrc: Oral  SpO2: 96%  Weight: 162 lb 6.4 oz (73.7 kg)  Height: 5' 2.2" (1.58 m)   Body mass index is 29.51 kg/m.     10/23/2022    3:00 PM 10/10/2021    3:58 PM 09/27/2020   10:38 AM 06/18/2020    2:47 PM 08/24/2018   11:57 AM 04/10/2015   10:10 AM 11/20/2014    9:04 AM  Advanced Directives  Does Patient Have a Medical Advance Directive? No Yes Yes Yes Yes Yes No  Type of Special educational needs teacher of [place];Living will Healthcare Power of [place];Living will Healthcare Power of [employer] Power of [employer] Power of [place];Living will   Does patient want to make changes to medical advance directive?    No - Patient declined     Copy of Healthcare Power of Attorney in Chart?  No - copy requested No - copy requested  No - copy requested    Would patient like information on creating a medical advance directive? Yes (MAU/Ambulatory/Procedural Areas - Information given)          Current Medications (verified) Outpatient Encounter Medications as of 10/23/2022  Medication Sig   acetaminophen (TYLENOL) 500 MG tablet Take 1,000 mg by mouth every 6 (six) hours as needed.   Cholecalciferol (VITAMIN D) 2000 units tablet Take 2,000 Units by mouth daily.   clopidogrel (PLAVIX) 75 MG tablet TAKE 1 TABLET BY MOUTH EVERY DAY   fexofenadine (ALLEGRA) 60 MG tablet Take 60 mg by mouth 2 (two) times daily.   fluticasone (FLONASE) 50 MCG/ACT nasal spray Place into both nostrils daily as needed for allergies or rhinitis.   Multiple Vitamins-Minerals (MULTIVITAMIN WOMEN 50+ PO) Take 1 tablet by mouth daily.   ondansetron  (ZOFRAN) 4 MG tablet Take 1 tablet (4 mg total) by mouth daily as needed for nausea or vomiting.   Polyethyl Glycol-Propyl Glycol (SYSTANE OP) Apply to eye. As needed   topiramate (TOPAMAX) 25 MG tablet TAKE ONE TABLET IN THE MORNING AND 3 IN THE EVENING   trimethoprim-polymyxin b (POLYTRIM) ophthalmic solution SMARTSIG:In Eye(s)   amoxicillin (AMOXIL) 875 MG tablet Take 1 tablet (875 mg total) by mouth 2 (two) times daily. (Patient not taking: Reported on 10/23/2022)   Magnesium 250 MG TABS Take 1 tablet by mouth with evening meal. (Patient not taking: Reported on 10/23/2022)   polyethylene glycol (MIRALAX / GLYCOLAX) 17 g packet Take 17 g by mouth daily. (Patient not taking: Reported on 09/22/2022)   vitamin C (ASCORBIC ACID) 250 MG tablet Take 250 mg by mouth daily.  (Patient not taking: Reported on 10/23/2022)   No facility-administered encounter medications on file as of 10/23/2022.    Allergies (verified) Aspirin, Elavil [amitriptyline], Latex, and Relafen [nabumetone]   History: Past Medical History:  Diagnosis Date   Abnormality of gait 04/10/2015   Cervical spondylosis without myelopathy 07/18/2013   Common migraine with intractable migraine 04/10/2015   DJD (degenerative joint disease)    Fibromyalgia    Hiatal hernia    Left sided numbness 05/29/2013   Migraine headache  TIA (transient ischemic attack) 05/29/2013   Vitreomacular adhesion of right eye 03/08/2020   Past Surgical History:  Procedure Laterality Date   ABDOMINAL HYSTERECTOMY     DILATION AND CURETTAGE OF UTERUS     HIATAL HERNIA REPAIR     TONSILLECTOMY     Family History  Problem Relation Age of Onset   CVA Mother    Stroke Father    Emphysema Father    Social History   Socioeconomic History   Marital status: Widowed    Spouse name: Not on file   Number of children: 5   Years of education: college   Highest education level: Not on file  Occupational History   Occupation: Retired  Tobacco Use    Smoking status: Never   Smokeless tobacco: Never  Vaping Use   Vaping status: Never Used  Substance and Sexual Activity   Alcohol use: No    Comment: very occasional   Drug use: No   Sexual activity: Not Currently  Other Topics Concern   Not on file  Social History Narrative   Patient is married with 5 children.   Patient is right handed.   Patient has college education.   Patient drinks 3 cups daily.         Social Determinants of Health   Financial Resource Strain: Low Risk  (10/23/2022)   Overall Financial Resource Strain (CARDIA)    Difficulty of Paying Living Expenses: Not hard at all  Food Insecurity: No Food Insecurity (10/23/2022)   Hunger Vital Sign    Worried About Running Out of Food in the Last Year: Never true    Ran Out of Food in the Last Year: Never true  Transportation Needs: No Transportation Needs (10/23/2022)   PRAPARE - Administrator, Civil Service (Medical): No    Lack of Transportation (Non-Medical): No  Physical Activity: Inactive (10/23/2022)   Exercise Vital Sign    Days of Exercise per Week: 0 days    Minutes of Exercise per Session: 0 min  Stress: No Stress Concern Present (10/23/2022)   [employer] of Occupational Health - Occupational Stress Questionnaire    Feeling of Stress : Not at all  Social Connections: Moderately Isolated (10/23/2022)   Social Connection and Isolation Panel [NHANES]    Frequency of Communication with Friends and Family: More than three times a week    Frequency of Social Gatherings with Friends and Family: Once a week    Attends Religious Services: 1 to 4 times per year    Active Member of [employer] or Organizations: No    Attends Banker Meetings: Never    Marital Status: Widowed    Tobacco Counseling Counseling given: Not Answered   Clinical Intake:  Pre-visit preparation completed: Yes  Pain : No/denies pain     Nutritional Status: BMI 25 -29 Overweight Nutritional Risks:  Nausea/ vomitting/ diarrhea (diarrhea when it rained) Diabetes: No  How often do you need to have someone help you when you read instructions, pamphlets, or other written materials from your doctor or pharmacy?: 1 - Never  Interpreter Needed?: No  Information entered by :: NAllen LPN   Activities of Daily Living    10/23/2022    2:49 PM  In your present state of health, do you have any difficulty performing the following activities:  Hearing? 1  Comment has hearing aids, but does wear  Vision? 0  Difficulty concentrating or making decisions? 0  Walking or climbing stairs?  1  Comment steep steps  Dressing or bathing? 0  Doing errands, shopping? 1  Comment daughter Catering manager and eating ? N  Using the Toilet? N  In the past six months, have you accidently leaked urine? Y  Comment wears depends or pads  Do you have problems with loss of bowel control? N  Managing your Medications? N  Managing your Finances? N  Housekeeping or managing your Housekeeping? N    Patient Care Team: Arnette Felts, FNP as PCP - General (General Practice)  Indicate any recent Medical Services you may have received from other than [facility] providers in the past year (date may be approximate).     Assessment:   This is a routine wellness examination for [place].  Hearing/Vision screen Hearing Screening - Comments:: Has hearing aids, but does not wear Vision Screening - Comments:: Regular eye exams, Dr. Ashley Royalty  Dietary issues and exercise activities discussed:     Goals Addressed             This Visit's Progress    Patient Stated       10/23/2022, wants to be able to walk 5 miles a day       Depression Screen    10/23/2022    3:03 PM 07/02/2022   11:43 AM 05/08/2022   11:30 AM 11/06/2021   11:21 AM 11/06/2021   11:20 AM 10/10/2021    4:00 PM 09/27/2020   10:39 AM  PHQ 2/9 Scores  PHQ - 2 Score 0 0 1 0 0 0 0  PHQ- 9 Score 0  8 0       Fall Risk    10/23/2022    3:02  PM 09/22/2022    8:53 AM 07/02/2022   11:43 AM 05/08/2022   11:30 AM 10/10/2021    3:59 PM  Fall Risk   Falls in the past year? 0 0 0 0 1  Comment     passed out  Number falls in past yr: 0 0   1  Injury with Fall? 0 0   0  Risk for fall due to : Medication side effect No Fall Risks Impaired balance/gait;Impaired mobility  Medication side effect  Follow up Falls prevention discussed;Falls evaluation completed Falls evaluation completed;Education provided   Falls evaluation completed;Education provided;Falls prevention discussed    MEDICARE RISK AT HOME:  Medicare Risk at Home - 10/23/22 1502     Any stairs in or around the home? Yes    If so, are there any without handrails? No    Home free of loose throw rugs in walkways, pet beds, electrical cords, etc? Yes    Adequate lighting in your home to reduce risk of falls? Yes    Life alert? No    Use of a cane, walker or w/c? Yes    Grab bars in the bathroom? Yes    Shower chair or bench in shower? No    Elevated toilet seat or a handicapped toilet? No             TIMED UP AND GO:  Was the test performed?  Yes  Length of time to ambulate 10 feet: 6 sec Gait slow and steady without use of assistive device    Cognitive Function:    05/01/2020   11:05 AM  MMSE - Mini Mental State Exam  Orientation to time 5  Orientation to Place 5  Registration 3  Attention/ Calculation 5  Recall 3  Language- name 2  objects 2  Language- repeat 1  Language- follow 3 step command 3  Language- read & follow direction 1  Write a sentence 1  Copy design 1  Total score 30        10/23/2022    3:05 PM 10/10/2021    4:06 PM 09/27/2020   10:44 AM 05/01/2020   10:55 AM 08/24/2018   12:04 PM  6CIT Screen  What Year? 0 points 0 points 0 points 0 points 0 points  What month? 0 points 0 points 0 points 0 points 0 points  What time? 0 points 0 points 0 points 3 points 0 points  Count back from 20 0 points 0 points 0 points 0 points 0 points  Months  in reverse 2 points 0 points 0 points 0 points 0 points  Repeat phrase 2 points 4 points 4 points 0 points 0 points  Total Score 4 points 4 points 4 points 3 points 0 points    Immunizations Immunization History  Administered Date(s) Administered   Fluad Quad(high Dose 65+) 11/18/2021   Influenza, High Dose Seasonal PF 11/02/2016, 01/06/2018, 02/24/2019   Influenza-Unspecified 01/06/2018   Moderna SARS-COV2 Booster Vaccination 09/04/2020   Moderna Sars-Covid-2 Vaccination 06/27/2019, 07/25/2019   PNEUMOCOCCAL CONJUGATE-20 10/10/2021   Pneumococcal Conjugate-13 08/27/2018   Pneumococcal Polysaccharide-23 10/21/2018, 10/28/2019   Tdap 03/22/2013    TDAP status: Up to date  Flu Vaccine status: Due, Education has been provided regarding the importance of this vaccine. Advised may receive this vaccine at local pharmacy or Health Dept. Aware to provide a copy of the vaccination record if obtained from local pharmacy or Health Dept. Verbalized acceptance and understanding.  Pneumococcal vaccine status: Up to date  Covid-19 vaccine status: Information provided on how to obtain vaccines.   Qualifies for Shingles Vaccine? Yes   Zostavax completed No   Shingrix Completed?: No.    Education has been provided regarding the importance of this vaccine. Patient has been advised to call insurance company to determine out of pocket expense if they have not yet received this vaccine. Advised may also receive vaccine at local pharmacy or Health Dept. Verbalized acceptance and understanding.  Screening Tests Health Maintenance  Topic Date Due   Zoster Vaccines- Shingrix (1 of 2) Never done   INFLUENZA VACCINE  10/09/2022   MAMMOGRAM  12/04/2022   DTaP/Tdap/Td (2 - Td or Tdap) 03/23/2023   Medicare Annual Wellness (AWV)  10/23/2023   Pneumonia Vaccine 59+ Years old  Completed   DEXA SCAN  Completed   HPV VACCINES  Aged Out   COVID-19 Vaccine  Discontinued   Hepatitis C Screening  Discontinued     Health Maintenance  Health Maintenance Due  Topic Date Due   Zoster Vaccines- Shingrix (1 of 2) Never done   INFLUENZA VACCINE  10/09/2022    Colorectal cancer screening: No longer required.   Mammogram status: Completed 12/03/2021. Repeat every year  Bone Density status: Completed 03/29/2015.  Lung Cancer Screening: (Low Dose CT Chest recommended if Age 19-80 years, 20 pack-year currently smoking OR have quit w/in 15years.) does not qualify.   Lung Cancer Screening Referral: no  Additional Screening:  Hepatitis C Screening: does not qualify  Vision Screening: Recommended annual ophthalmology exams for early detection of glaucoma and other disorders of the eye. Is the patient up to date with their annual eye exam?  Yes  Who is the provider or what is the name of the office in which the patient attends annual eye exams?  Dr. Ashley Royalty If pt is not established with a provider, would they like to be referred to a provider to establish care? No .   Dental Screening: Recommended annual dental exams for proper oral hygiene  Diabetic Foot Exam: n/a  Community Resource Referral / Chronic Care Management: CRR required this visit?  No   CCM required this visit?  No     Plan:     I have personally reviewed and noted the following in the patient's chart:   Medical and social history Use of alcohol, tobacco or illicit drugs  Current medications and supplements including opioid prescriptions. Patient is not currently taking opioid prescriptions. Functional ability and status Nutritional status Physical activity Advanced directives List of other physicians Hospitalizations, surgeries, and ER visits in previous 12 months Vitals Screenings to include cognitive, depression, and falls Referrals and appointments  In addition, I have reviewed and discussed with patient certain preventive protocols, quality metrics, and best practice recommendations. A written personalized care  plan for preventive services as well as general preventive health recommendations were provided to patient.     Barb Merino, LPN   1/61/0960   After Visit Summary: in person  Nurse Notes: none

## 2022-10-23 NOTE — Patient Instructions (Signed)
Ms. Nohl , Thank you for taking time to come for your Medicare Wellness Visit. I appreciate your ongoing commitment to your health goals. Please review the following plan we discussed and let me know if I can assist you in the future.   Referrals/Orders/Follow-Ups/Clinician Recommendations: none  This is a list of the screening recommended for you and due dates:  Health Maintenance  Topic Date Due   Zoster (Shingles) Vaccine (1 of 2) Never done   Flu Shot  10/09/2022   Mammogram  12/04/2022   DTaP/Tdap/Td vaccine (2 - Td or Tdap) 03/23/2023   Medicare Annual Wellness Visit  10/23/2023   Pneumonia Vaccine  Completed   DEXA scan (bone density measurement)  Completed   HPV Vaccine  Aged Out   COVID-19 Vaccine  Discontinued   Hepatitis C Screening  Discontinued    Advanced directives: (Provided) Advance directive discussed with you today. I have provided a copy for you to complete at home and have notarized. Once this is complete, please bring a copy in to our office so we can scan it into your chart.   Next Medicare Annual Wellness Visit scheduled for next year: No, office will schedule appointment  Preventive Care 65 Years and Older, Female Preventive care refers to lifestyle choices and visits with your health care provider that can promote health and wellness. What does preventive care include? A yearly physical exam. This is also called an annual well check. Dental exams once or twice a year. Routine eye exams. Ask your health care provider how often you should have your eyes checked. Personal lifestyle choices, including: Daily care of your teeth and gums. Regular physical activity. Eating a healthy diet. Avoiding tobacco and drug use. Limiting alcohol use. Practicing safe sex. Taking low-dose aspirin every day. Taking vitamin and mineral supplements as recommended by your health care provider. What happens during an annual well check? The services and screenings done by  your health care provider during your annual well check will depend on your age, overall health, lifestyle risk factors, and family history of disease. Counseling  Your health care provider may ask you questions about your: Alcohol use. Tobacco use. Drug use. Emotional well-being. Home and relationship well-being. Sexual activity. Eating habits. History of falls. Memory and ability to understand (cognition). Work and work Astronomer. Reproductive health. Screening  You may have the following tests or measurements: Height, weight, and BMI. Blood pressure. Lipid and cholesterol levels. These may be checked every 5 years, or more frequently if you are over 68 years old. Skin check. Lung cancer screening. You may have this screening every year starting at age 18 if you have a 30-pack-year history of smoking and currently smoke or have quit within the past 15 years. Fecal occult blood test (FOBT) of the stool. You may have this test every year starting at age 20. Flexible sigmoidoscopy or colonoscopy. You may have a sigmoidoscopy every 5 years or a colonoscopy every 10 years starting at age 34. Hepatitis C blood test. Hepatitis B blood test. Sexually transmitted disease (STD) testing. Diabetes screening. This is done by checking your blood sugar (glucose) after you have not eaten for a while (fasting). You may have this done every 1-3 years. Bone density scan. This is done to screen for osteoporosis. You may have this done starting at age 81. Mammogram. This may be done every 1-2 years. Talk to your health care provider about how often you should have regular mammograms. Talk with your health care provider  about your test results, treatment options, and if necessary, the need for more tests. Vaccines  Your health care provider may recommend certain vaccines, such as: Influenza vaccine. This is recommended every year. Tetanus, diphtheria, and acellular pertussis (Tdap, Td) vaccine. You  may need a Td booster every 10 years. Zoster vaccine. You may need this after age 74. Pneumococcal 13-valent conjugate (PCV13) vaccine. One dose is recommended after age 69. Pneumococcal polysaccharide (PPSV23) vaccine. One dose is recommended after age 58. Talk to your health care provider about which screenings and vaccines you need and how often you need them. This information is not intended to replace advice given to you by your health care provider. Make sure you discuss any questions you have with your health care provider. Document Released: 03/23/2015 Document Revised: 11/14/2015 Document Reviewed: 12/26/2014 Elsevier Interactive Patient Education  2017 [employer].  Fall Prevention in the Home Falls can cause injuries. They can happen to people of all ages. There are many things you can do to make your home safe and to help prevent falls. What can I do on the outside of my home? Regularly fix the edges of walkways and driveways and fix any cracks. Remove anything that might make you trip as you walk through a door, such as a raised step or threshold. Trim any bushes or trees on the path to your home. Use bright outdoor lighting. Clear any walking paths of anything that might make someone trip, such as rocks or tools. Regularly check to see if handrails are loose or broken. Make sure that both sides of any steps have handrails. Any raised decks and porches should have guardrails on the edges. Have any leaves, snow, or ice cleared regularly. Use sand or salt on walking paths during winter. Clean up any spills in your garage right away. This includes oil or grease spills. What can I do in the bathroom? Use night lights. Install grab bars by the toilet and in the tub and shower. Do not use towel bars as grab bars. Use non-skid mats or decals in the tub or shower. If you need to sit down in the shower, use a plastic, non-slip stool. Keep the floor dry. Clean up any water that spills  on the floor as soon as it happens. Remove soap buildup in the tub or shower regularly. Attach bath mats securely with double-sided non-slip rug tape. Do not have throw rugs and other things on the floor that can make you trip. What can I do in the bedroom? Use night lights. Make sure that you have a light by your bed that is easy to reach. Do not use any sheets or blankets that are too big for your bed. They should not hang down onto the floor. Have a firm chair that has side arms. You can use this for support while you get dressed. Do not have throw rugs and other things on the floor that can make you trip. What can I do in the kitchen? Clean up any spills right away. Avoid walking on wet floors. Keep items that you use a lot in easy-to-reach places. If you need to reach something above you, use a strong step stool that has a grab bar. Keep electrical cords out of the way. Do not use floor polish or wax that makes floors slippery. If you must use wax, use non-skid floor wax. Do not have throw rugs and other things on the floor that can make you trip. What can I do  with my stairs? Do not leave any items on the stairs. Make sure that there are handrails on both sides of the stairs and use them. Fix handrails that are broken or loose. Make sure that handrails are as long as the stairways. Check any carpeting to make sure that it is firmly attached to the stairs. Fix any carpet that is loose or worn. Avoid having throw rugs at the top or bottom of the stairs. If you do have throw rugs, attach them to the floor with carpet tape. Make sure that you have a light switch at the top of the stairs and the bottom of the stairs. If you do not have them, ask someone to add them for you. What else can I do to help prevent falls? Wear shoes that: Do not have high heels. Have rubber bottoms. Are comfortable and fit you well. Are closed at the toe. Do not wear sandals. If you use a stepladder: Make  sure that it is fully opened. Do not climb a closed stepladder. Make sure that both sides of the stepladder are locked into place. Ask someone to hold it for you, if possible. Clearly mark and make sure that you can see: Any grab bars or handrails. First and last steps. Where the edge of each step is. Use tools that help you move around (mobility aids) if they are needed. These include: Canes. Walkers. Scooters. Crutches. Turn on the lights when you go into a dark area. Replace any light bulbs as soon as they burn out. Set up your furniture so you have a clear path. Avoid moving your furniture around. If any of your floors are uneven, fix them. If there are any pets around you, be aware of where they are. Review your medicines with your doctor. Some medicines can make you feel dizzy. This can increase your chance of falling. Ask your doctor what other things that you can do to help prevent falls. This information is not intended to replace advice given to you by your health care provider. Make sure you discuss any questions you have with your health care provider. Document Released: 12/21/2008 Document Revised: 08/02/2015 Document Reviewed: 03/31/2014 Elsevier Interactive Patient Education  2017 [employer].

## 2022-10-29 ENCOUNTER — Encounter (INDEPENDENT_AMBULATORY_CARE_PROVIDER_SITE_OTHER): Payer: Medicare Other | Admitting: Ophthalmology

## 2022-10-29 DIAGNOSIS — H43813 Vitreous degeneration, bilateral: Secondary | ICD-10-CM | POA: Diagnosis not present

## 2022-10-29 DIAGNOSIS — H353122 Nonexudative age-related macular degeneration, left eye, intermediate dry stage: Secondary | ICD-10-CM | POA: Diagnosis not present

## 2022-10-29 DIAGNOSIS — H353211 Exudative age-related macular degeneration, right eye, with active choroidal neovascularization: Secondary | ICD-10-CM

## 2022-11-26 ENCOUNTER — Encounter (INDEPENDENT_AMBULATORY_CARE_PROVIDER_SITE_OTHER): Payer: Medicare Other | Admitting: Ophthalmology

## 2022-12-03 ENCOUNTER — Other Ambulatory Visit: Payer: Self-pay | Admitting: Nurse Practitioner

## 2022-12-03 DIAGNOSIS — K449 Diaphragmatic hernia without obstruction or gangrene: Secondary | ICD-10-CM

## 2022-12-09 DIAGNOSIS — Z1231 Encounter for screening mammogram for malignant neoplasm of breast: Secondary | ICD-10-CM | POA: Diagnosis not present

## 2022-12-09 LAB — HM MAMMOGRAPHY

## 2022-12-10 ENCOUNTER — Encounter (INDEPENDENT_AMBULATORY_CARE_PROVIDER_SITE_OTHER): Payer: Medicare Other | Admitting: Ophthalmology

## 2022-12-10 DIAGNOSIS — H353122 Nonexudative age-related macular degeneration, left eye, intermediate dry stage: Secondary | ICD-10-CM | POA: Diagnosis not present

## 2022-12-10 DIAGNOSIS — H2512 Age-related nuclear cataract, left eye: Secondary | ICD-10-CM | POA: Diagnosis not present

## 2022-12-10 DIAGNOSIS — H43813 Vitreous degeneration, bilateral: Secondary | ICD-10-CM

## 2022-12-10 DIAGNOSIS — H353211 Exudative age-related macular degeneration, right eye, with active choroidal neovascularization: Secondary | ICD-10-CM

## 2022-12-15 ENCOUNTER — Encounter: Payer: Self-pay | Admitting: Nurse Practitioner

## 2022-12-18 DIAGNOSIS — Z8673 Personal history of transient ischemic attack (TIA), and cerebral infarction without residual deficits: Secondary | ICD-10-CM | POA: Diagnosis not present

## 2022-12-18 DIAGNOSIS — K449 Diaphragmatic hernia without obstruction or gangrene: Secondary | ICD-10-CM | POA: Diagnosis not present

## 2022-12-27 IMAGING — CT CT HEAD W/O CM
4 series · 16 of 47 positions shown, 18 images · non-contrast
Comparison: CT head 05/29/2013

CLINICAL DATA: Memory loss.  History of dementia and mini strokes.

EXAM:
CT HEAD WITHOUT CONTRAST
TECHNIQUE: Contiguous axial images were obtained from the base of the skull
through the vertex without intravenous contrast.

[Series 2: head 5.00 hr40 s3 axial ibhc · axial · 0.42mm/px · z∈[-601,-481]mm · 7 of 33 slices shown, 9 images]
[im 5/33  brain]
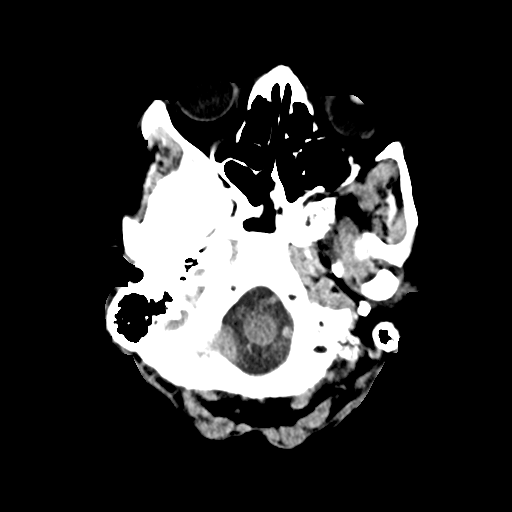
[im 5/33  bone]
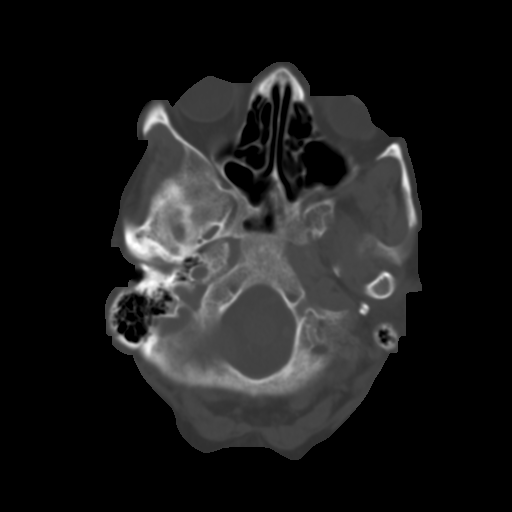
[im 9/33  brain]
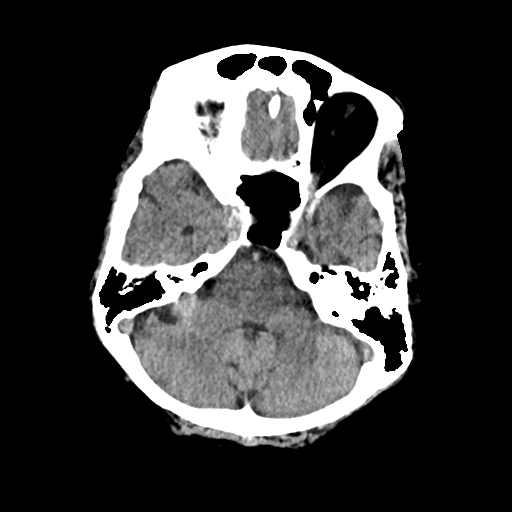
[im 13/33  brain]
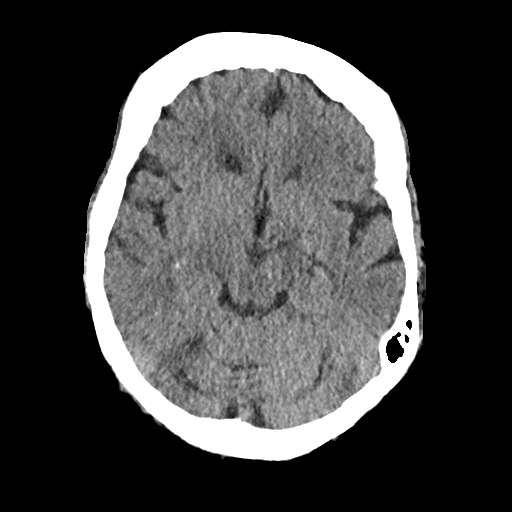
[im 17/33  brain]
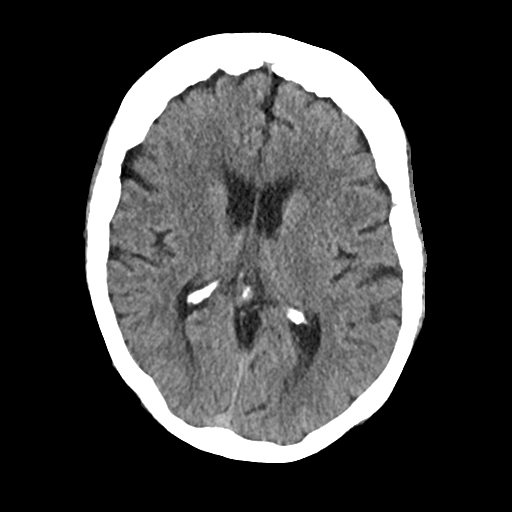
[im 21/33  brain]
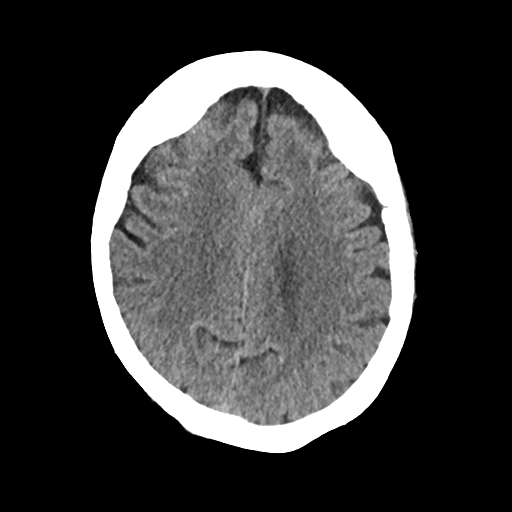
[im 21/33  bone]
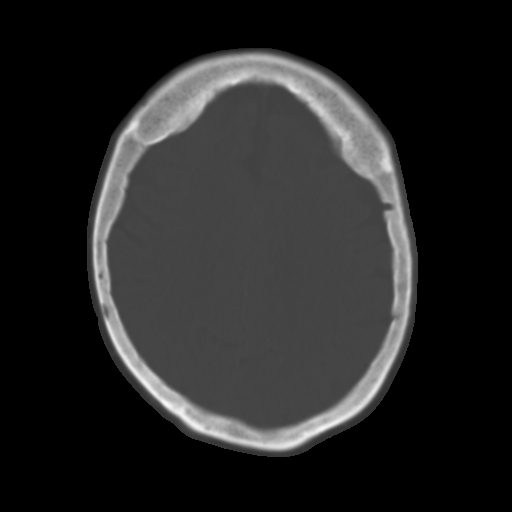
[im 25/33  brain]
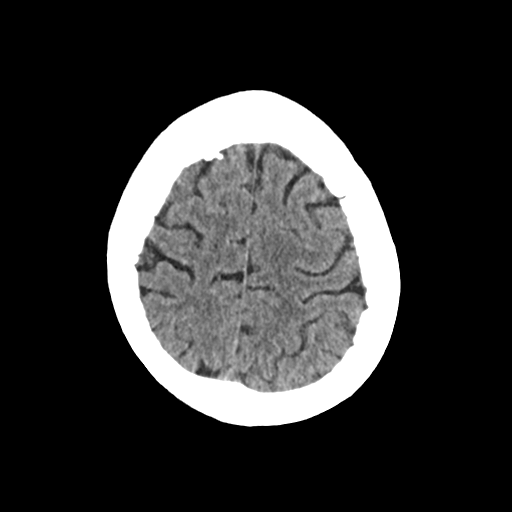
[im 29/33  brain]
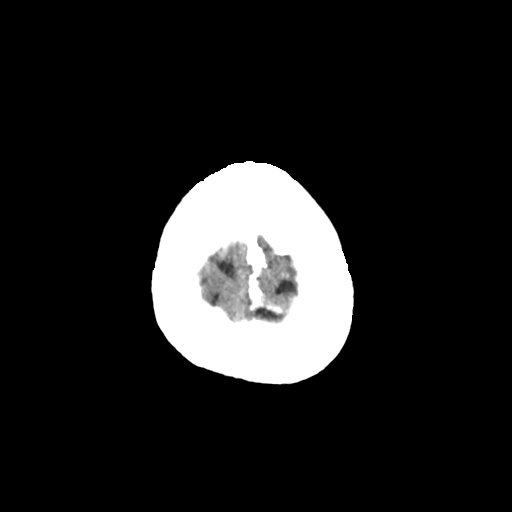

[Series 3: head 2.00 hr60 s3 axial bone · axial · 0.42mm/px · z∈[-607,-575]mm · 3 of 83 slices shown]
[im 9/83  bone]
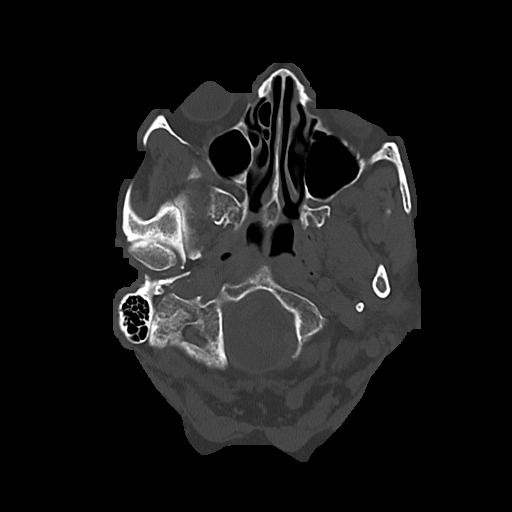
[im 17/83  bone]
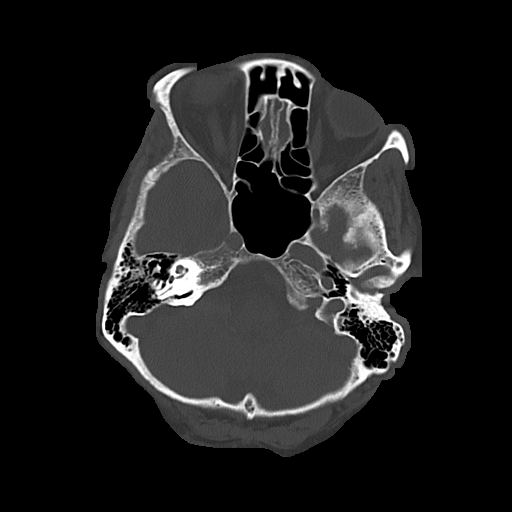
[im 25/83  bone]
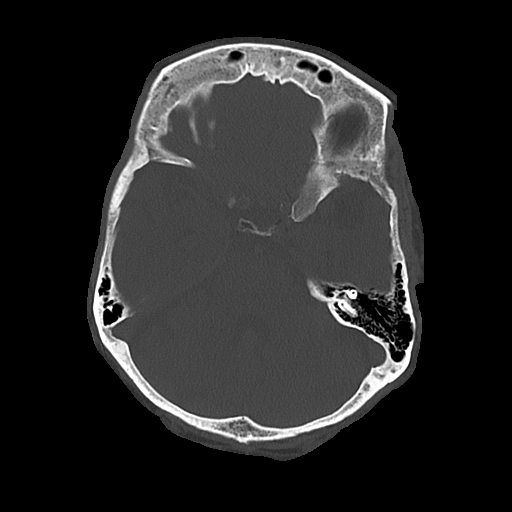

[Series 4: head 3.00 hr40 s3 sag · sagittal · 0.33mm/px · 3 of 60 slices shown]
[im 20/60  brain]
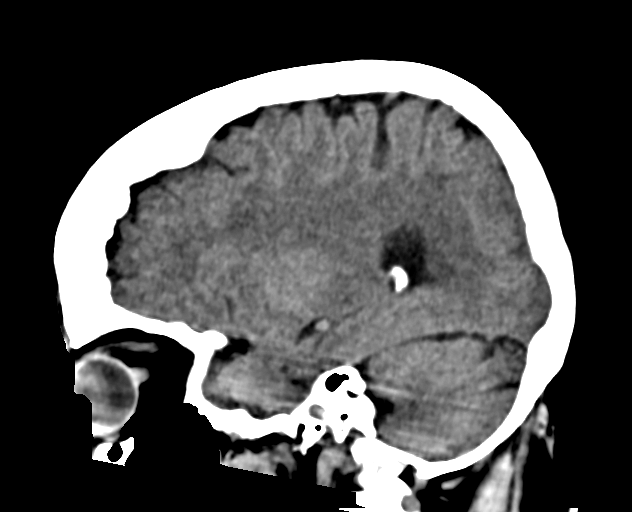
[im 30/60  brain]
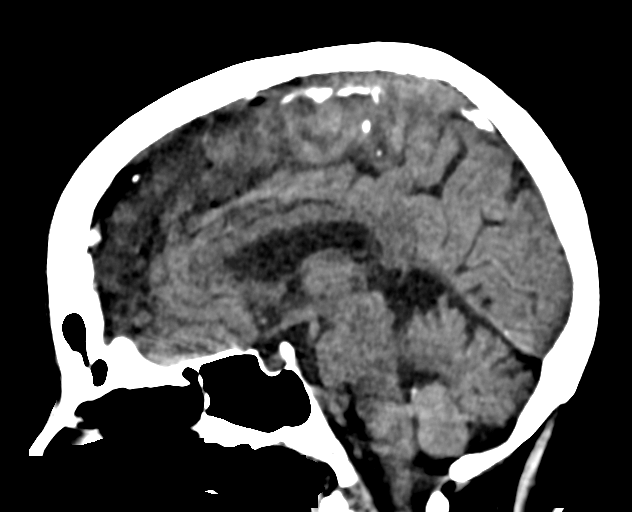
[im 40/60  brain]
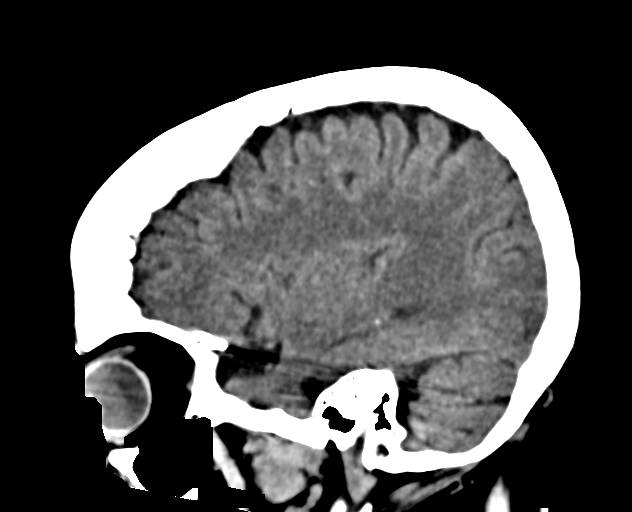

[Series 6: head 3.00 hr40 s3 cor · coronal · 0.33mm/px · 3 of 69 slices shown]
[im 23/69  brain]
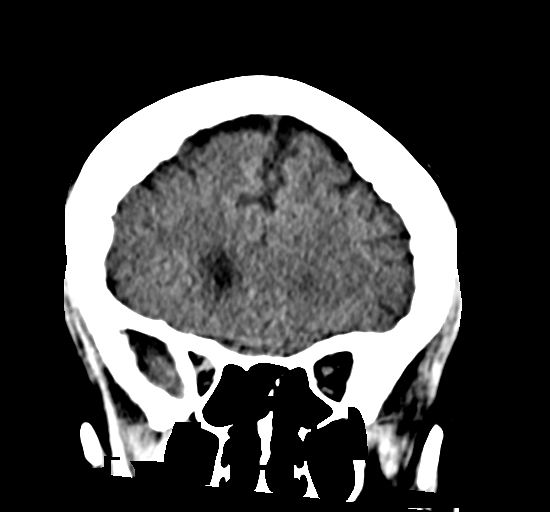
[im 31/69  brain]
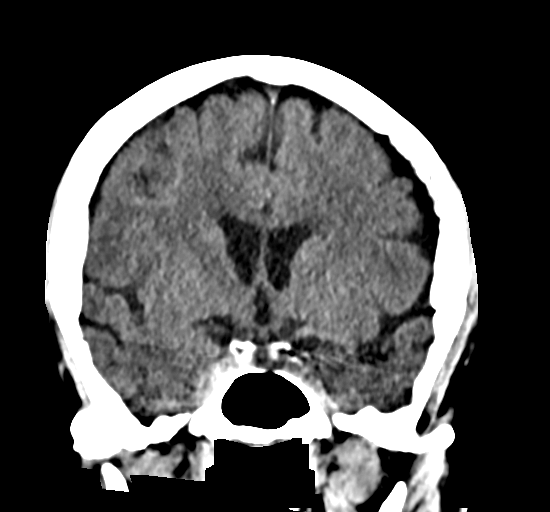
[im 38/69  brain]
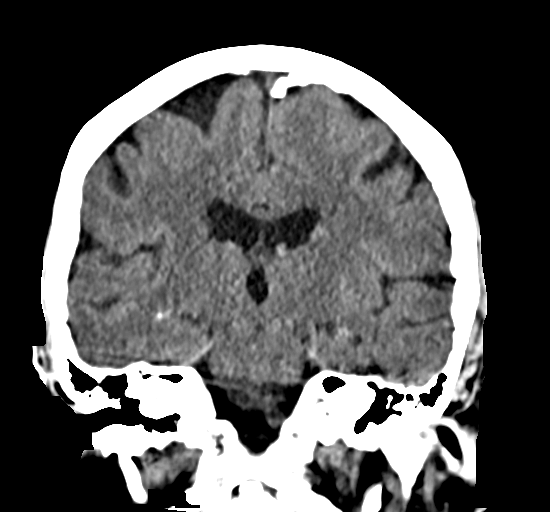

[16 of 47 positions shown; findings below may reference images not displayed]

FINDINGS: Brain: There is no evidence of acute intracranial hemorrhage, mass
lesion, brain edema or extra-axial fluid collection. The ventricles
and subarachnoid spaces are appropriately sized for age. Stable mild
chronic small vessel ischemic changes in the periventricular white
matter. No evidence of acute cortical based infarct.

Vascular:  No hyperdense vessel identified.

Skull: Calvarial hyperostosis without acute or focal abnormality.

Sinuses/Orbits: The visualized paranasal sinuses and mastoid air
cells are clear. No orbital abnormalities are seen.

Other: Asymmetric osteoarthritis at the right temporomandibular
joint.
IMPRESSION: No acute intracranial findings. Stable mild chronic small vessel
ischemic changes.

## 2023-01-07 ENCOUNTER — Encounter (INDEPENDENT_AMBULATORY_CARE_PROVIDER_SITE_OTHER): Payer: Medicare Other | Admitting: Ophthalmology

## 2023-01-07 DIAGNOSIS — H353211 Exudative age-related macular degeneration, right eye, with active choroidal neovascularization: Secondary | ICD-10-CM | POA: Diagnosis not present

## 2023-01-07 DIAGNOSIS — H353121 Nonexudative age-related macular degeneration, left eye, early dry stage: Secondary | ICD-10-CM

## 2023-01-07 DIAGNOSIS — H43813 Vitreous degeneration, bilateral: Secondary | ICD-10-CM | POA: Diagnosis not present

## 2023-01-12 ENCOUNTER — Encounter: Payer: Self-pay | Admitting: Nurse Practitioner

## 2023-01-12 ENCOUNTER — Ambulatory Visit (INDEPENDENT_AMBULATORY_CARE_PROVIDER_SITE_OTHER): Payer: Medicare Other | Admitting: Nurse Practitioner

## 2023-01-12 VITALS — BP 100/70 | HR 96 | Temp 98.2°F | Ht 62.0 in | Wt 158.4 lb

## 2023-01-12 DIAGNOSIS — R7309 Other abnormal glucose: Secondary | ICD-10-CM

## 2023-01-12 DIAGNOSIS — Z01818 Encounter for other preprocedural examination: Secondary | ICD-10-CM | POA: Insufficient documentation

## 2023-01-12 DIAGNOSIS — K449 Diaphragmatic hernia without obstruction or gangrene: Secondary | ICD-10-CM | POA: Diagnosis not present

## 2023-01-12 DIAGNOSIS — Z2821 Immunization not carried out because of patient refusal: Secondary | ICD-10-CM

## 2023-01-12 DIAGNOSIS — Z23 Encounter for immunization: Secondary | ICD-10-CM | POA: Insufficient documentation

## 2023-01-12 NOTE — Progress Notes (Signed)
Madelaine Bhat, CMA,acting as a Neurosurgeon for Arnette Felts, FNP.,have documented all relevant documentation on the behalf of Arnette Felts, FNP,as directed by  Arnette Felts, FNP while in the presence of Arnette Felts, FNP.  Subjective:  Patient ID: [name] , female    DOB: [birth year] , 82 y.o.   MRN: [id number]  Chief Complaint  Patient presents with   Pre-op Exam    HPI  Patient presents today for a pre op exam, Patient reports compliance with medication. Patient denies any chest pain, SOB, or headaches. Patient is having a hiatal hernia repair.  She is already scheduled to see Dr. Tresa Endo for cardiac clearance due to having a history of mini strokes (TIAs) on December 17th at 1:40 pm. She is to hold plavix 7 days before and restart 24 hours post surgery.  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     Family History  Problem Relation Age of Onset   CVA Mother    Stroke Father    Emphysema Father      Current Outpatient Medications:    acetaminophen (TYLENOL) 500 MG tablet, Take 1,000 mg by mouth every 6 (six) hours as needed., Disp: , Rfl:    Cholecalciferol (VITAMIN D) 2000 units tablet, Take 2,000 Units by mouth daily., Disp: , Rfl:    clopidogrel (PLAVIX) 75 MG tablet, TAKE 1 TABLET BY MOUTH EVERY DAY, Disp: 90 tablet, Rfl: 1   fexofenadine (ALLEGRA) 60 MG tablet, Take 60 mg by mouth 2 (two) times daily., Disp: , Rfl:    fluticasone (FLONASE) 50 MCG/ACT nasal spray,  Place into both nostrils daily as needed for allergies or rhinitis., Disp: , Rfl:    Multiple Vitamins-Minerals (MULTIVITAMIN WOMEN 50+ PO), Take 1 tablet by mouth daily., Disp: , Rfl:    ondansetron (ZOFRAN) 4 MG tablet, Take 1 tablet (4 mg total) by mouth daily as needed for nausea or vomiting., Disp: 30 tablet, Rfl: 1   Polyethyl Glycol-Propyl Glycol (SYSTANE OP), Apply to eye. As needed, Disp: , Rfl:    topiramate (TOPAMAX) 25 MG tablet, TAKE ONE TABLET IN THE MORNING AND 3 IN THE EVENING, Disp: 360 tablet, Rfl: 3   trimethoprim-polymyxin b (POLYTRIM) ophthalmic solution, SMARTSIG:In Eye(s), Disp: , Rfl:    amoxicillin (AMOXIL) 875 MG tablet, Take 1 tablet (875 mg total) by mouth 2 (two) times daily. (Patient not taking: Reported on 10/23/2022), Disp: 14 tablet, Rfl: 0   Magnesium 250 MG TABS, Take 1 tablet by mouth with evening meal. (Patient not taking: Reported on 10/23/2022), Disp: 30 tablet, Rfl: 2   polyethylene glycol (MIRALAX / GLYCOLAX) 17 g packet, Take 17 g by mouth daily. (Patient not taking: Reported on 09/22/2022), Disp: 14 each, Rfl: 0   vitamin C (ASCORBIC ACID) 250 MG tablet, Take 250 mg by mouth daily.  (Patient not taking: Reported on 10/23/2022), Disp: , Rfl:    Allergies  Allergen Reactions  Aspirin Anaphylaxis and Hives   Elavil [Amitriptyline] Anaphylaxis   Latex    Relafen [Nabumetone] Hives     Review of Systems  Constitutional: Negative.  Negative for fatigue.  Eyes: Negative.   Respiratory: Negative.    Cardiovascular: Negative.   Endocrine: Negative.   Genitourinary: Negative.   Skin: Negative.   Allergic/Immunologic: Negative.   Neurological: Negative.   Hematological: Negative.   Psychiatric/Behavioral:  Negative for decreased concentration.   All other systems reviewed and are negative.    Today's Vitals   01/12/23 1116  BP: 100/70  Pulse: 96  Temp: 98.2 F (36.8 C)  TempSrc: Oral  Weight: 158 lb 6.4 oz (71.8 kg)  Height: 5\' 2"  (1.575 m)   PainSc: 0-No pain   Body mass index is 28.97 kg/m.  Wt Readings from Last 3 Encounters:  01/12/23 158 lb 6.4 oz (71.8 kg)  10/23/22 162 lb 6.4 oz (73.7 kg)  09/22/22 163 lb 9.6 oz (74.2 kg)    The ASCVD Risk score (Arnett DK, et al., 2019) failed to calculate for the following reasons:   The 2019 ASCVD risk score is only valid for ages 44 to 80  Objective:  Physical Exam Vitals reviewed.  Constitutional:      General: She is not in acute distress.    Appearance: Normal appearance.  Cardiovascular:     Rate and Rhythm: Normal rate and regular rhythm.     Pulses: Normal pulses.     Heart sounds: No murmur heard. Pulmonary:     Effort: Pulmonary effort is normal. No respiratory distress.     Breath sounds: Normal breath sounds. No wheezing.  Neurological:     General: No focal deficit present.     Mental Status: She is alert and oriented to person, place, and time.     Cranial Nerves: No cranial nerve deficit.  Psychiatric:        Mood and Affect: Mood normal.        Behavior: Behavior normal.        Thought Content: Thought content normal.        Judgment: Judgment normal.         Assessment And Plan:  Hiatal hernia  Pre-op evaluation Assessment & Plan: She is not scheduled for her Hiatal Hernia surgery at this time, she is now waiting on clearance from Cardiology due to her history of TIAs. Medical labs done today to clear medically. At this time she is advised to hold her plavix for 7 days prior and restart 24 hours after surgery.  EKG done with NSR HR 88.   Orders: -     EKG 12-Lead -     CBC with Differential/Platelet -     BMP8+eGFR  Abnormal glucose Assessment & Plan: Most recent HgbA1c stable, will recheck today due to having surgery for her hiatal hernia.    Orders: -     Hemoglobin A1c  Need for influenza vaccination Assessment & Plan: Influenza vaccine administered Encouraged to take Tylenol as needed for fever or muscle aches.   Orders: -      Flu Vaccine Trivalent High Dose (Fluad)  Herpes zoster vaccination declined Assessment & Plan: Declines shingrix, educated on disease process and is aware if he changes his mind to notify office      No follow-ups on file.  Patient was given opportunity to ask questions. Patient verbalized understanding of the plan and was able to repeat key elements of the plan. All questions were answered  to their satisfaction.    Jeanell Sparrow, FNP, have reviewed all documentation for this visit. The documentation on 01/12/23 for the exam, diagnosis, procedures, and orders are all accurate and complete.   IF YOU HAVE BEEN REFERRED TO A SPECIALIST, IT MAY TAKE 1-2 WEEKS TO SCHEDULE/PROCESS THE REFERRAL. IF YOU HAVE NOT HEARD FROM US/SPECIALIST IN TWO WEEKS, PLEASE GIVE [place] A CALL AT [phone].

## 2023-01-12 NOTE — Assessment & Plan Note (Signed)
Most recent HgbA1c stable, will recheck today due to having surgery for her hiatal hernia.

## 2023-01-12 NOTE — Assessment & Plan Note (Signed)
Declines shingrix, educated on disease process and is aware if he changes his mind to notify office  

## 2023-01-12 NOTE — Assessment & Plan Note (Signed)
Influenza vaccine administered Encouraged to take Tylenol as needed for fever or muscle aches.

## 2023-01-12 NOTE — Assessment & Plan Note (Addendum)
She is not scheduled for her Hiatal Hernia surgery at this time, she is now waiting on clearance from Cardiology due to her history of TIAs. Medical labs done today to clear medically. At this time she is advised to hold her plavix for 7 days prior and restart 24 hours after surgery.  EKG done with NSR HR 88.

## 2023-01-13 ENCOUNTER — Other Ambulatory Visit: Payer: Self-pay | Admitting: Nurse Practitioner

## 2023-01-13 DIAGNOSIS — Z8673 Personal history of transient ischemic attack (TIA), and cerebral infarction without residual deficits: Secondary | ICD-10-CM

## 2023-01-13 LAB — CBC WITH DIFFERENTIAL/PLATELET
Basophils Absolute: 0 10*3/uL (ref 0.0–0.2)
Basos: 1 %
EOS (ABSOLUTE): 0.2 10*3/uL (ref 0.0–0.4)
Eos: 4 %
Hematocrit: 42.2 % (ref 34.0–46.6)
Hemoglobin: 14 g/dL (ref 11.1–15.9)
Immature Grans (Abs): 0 10*3/uL (ref 0.0–0.1)
Immature Granulocytes: 0 %
Lymphocytes Absolute: 0.8 10*3/uL (ref 0.7–3.1)
Lymphs: 19 %
MCH: 30.8 pg (ref 26.6–33.0)
MCHC: 33.2 g/dL (ref 31.5–35.7)
MCV: 93 fL (ref 79–97)
Monocytes Absolute: 0.3 10*3/uL (ref 0.1–0.9)
Monocytes: 8 %
Neutrophils Absolute: 2.7 10*3/uL (ref 1.4–7.0)
Neutrophils: 68 %
Platelets: 242 10*3/uL (ref 150–450)
RBC: 4.54 x10E6/uL (ref 3.77–5.28)
RDW: 12.7 % (ref 11.7–15.4)
WBC: 4.1 10*3/uL (ref 3.4–10.8)

## 2023-01-13 LAB — HEMOGLOBIN A1C
Est. average glucose Bld gHb Est-mCnc: 114 mg/dL
Hgb A1c MFr Bld: 5.6 % (ref 4.8–5.6)

## 2023-01-13 LAB — BMP8+EGFR
BUN/Creatinine Ratio: 9 — ABNORMAL LOW (ref 12–28)
BUN: 8 mg/dL (ref 8–27)
CO2: 18 mmol/L — ABNORMAL LOW (ref 20–29)
Calcium: 9.2 mg/dL (ref 8.7–10.3)
Chloride: 109 mmol/L — ABNORMAL HIGH (ref 96–106)
Creatinine, Ser: 0.94 mg/dL (ref 0.57–1.00)
Glucose: 130 mg/dL — ABNORMAL HIGH (ref 70–99)
Potassium: 4 mmol/L (ref 3.5–5.2)
Sodium: 143 mmol/L (ref 134–144)
eGFR: 61 mL/min/{1.73_m2} (ref >59)

## 2023-02-04 ENCOUNTER — Encounter (INDEPENDENT_AMBULATORY_CARE_PROVIDER_SITE_OTHER): Payer: Medicare Other | Admitting: Ophthalmology

## 2023-02-10 ENCOUNTER — Encounter (INDEPENDENT_AMBULATORY_CARE_PROVIDER_SITE_OTHER): Payer: Medicare Other | Admitting: Ophthalmology

## 2023-02-10 DIAGNOSIS — H43813 Vitreous degeneration, bilateral: Secondary | ICD-10-CM

## 2023-02-10 DIAGNOSIS — H353121 Nonexudative age-related macular degeneration, left eye, early dry stage: Secondary | ICD-10-CM

## 2023-02-10 DIAGNOSIS — H2512 Age-related nuclear cataract, left eye: Secondary | ICD-10-CM

## 2023-02-10 DIAGNOSIS — H353211 Exudative age-related macular degeneration, right eye, with active choroidal neovascularization: Secondary | ICD-10-CM

## 2023-02-24 ENCOUNTER — Ambulatory Visit: Payer: Medicare Other | Admitting: Cardiovascular Disease

## 2023-03-12 DIAGNOSIS — H3530 Unspecified macular degeneration: Secondary | ICD-10-CM | POA: Diagnosis not present

## 2023-03-12 DIAGNOSIS — Z961 Presence of intraocular lens: Secondary | ICD-10-CM | POA: Diagnosis not present

## 2023-03-12 DIAGNOSIS — H25812 Combined forms of age-related cataract, left eye: Secondary | ICD-10-CM | POA: Diagnosis not present

## 2023-03-17 ENCOUNTER — Encounter (INDEPENDENT_AMBULATORY_CARE_PROVIDER_SITE_OTHER): Payer: Medicare Other | Admitting: Ophthalmology

## 2023-03-31 ENCOUNTER — Encounter (INDEPENDENT_AMBULATORY_CARE_PROVIDER_SITE_OTHER): Payer: Medicare Other | Admitting: Ophthalmology

## 2023-03-31 DIAGNOSIS — H353121 Nonexudative age-related macular degeneration, left eye, early dry stage: Secondary | ICD-10-CM | POA: Diagnosis not present

## 2023-03-31 DIAGNOSIS — H353211 Exudative age-related macular degeneration, right eye, with active choroidal neovascularization: Secondary | ICD-10-CM

## 2023-03-31 DIAGNOSIS — H43813 Vitreous degeneration, bilateral: Secondary | ICD-10-CM

## 2023-04-11 NOTE — Progress Notes (Signed)
 Cardiology Office Note:    Date:  04/14/2023   ID:  [name], DOB [birth year], MRN [id number]  PCP:  Mary Speaks, FNP   [facility] Providers Cardiologist:  None     Referring MD: [name], [name], *   Chief Complaint  Patient presents with   Pre-op Exam    History of Present Illness:    [name] is a 83 y.o. female is seen at the request of Fernandez Georgina FNP for pre op clearance for hiatal hernia surgery. She has a history of TIAs and HLD. Prior Echo in 2015 was normal. She has no cardiac history. Denies history of MI, CHF, angina, CAD. No arrhythmia. Denies any chest pain or dyspnea. Able to do her own housework and walk at store without restriction.   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     Allergies:   Aspirin, Elavil [amitriptyline], Latex, and Relafen [nabumetone]   Social History   Socioeconomic History   Marital status: Widowed    Spouse name: Not on file   Number of children: 5   Years of education: college   Highest education level: Not on file  Occupational History   Occupation: Retired  Tobacco Use   Smoking status: Never   Smokeless tobacco: Never  Vaping Use   Vaping status: Never Used  Substance and Sexual Activity   Alcohol  use: No    Comment: very  occasional   Drug use: No   Sexual activity: Not Currently  Other Topics Concern   Not on file  Social History Narrative   Patient is married with 5 children.   Patient is right handed.   Patient has college education.   Patient drinks 3 cups daily.         Social Drivers of Corporate Investment Banker Strain: Low Risk  (10/23/2022)   Overall Financial Resource Strain (CARDIA)    Difficulty of Paying Living Expenses: Not hard at all  Food Insecurity: No Food Insecurity (10/23/2022)   Hunger Vital Sign    Worried About Running Out of Food in the Last Year: Never true    Ran Out of Food in the Last Year: Never true  Transportation Needs: No Transportation Needs (10/23/2022)   PRAPARE - Administrator, Civil Service (Medical): No    Lack of Transportation (Non-Medical): No  Physical Activity: Inactive (10/23/2022)   Exercise Vital Sign    Days of Exercise per Week: 0 days    Minutes of Exercise per Session: 0 min  Stress: No Stress Concern Present (10/23/2022)   [employer] of Occupational Health - Occupational Stress Questionnaire    Feeling of Stress : Not at all  Social Connections: Moderately Isolated (10/23/2022)   Social Connection and Isolation  Panel [NHANES]    Frequency of Communication with Friends and Family: More than three times a week    Frequency of Social Gatherings with Friends and Family: Once a week    Attends Religious Services: 1 to 4 times per year    Active Member of [employer] or Organizations: No    Attends Banker Meetings: Never    Marital Status: Widowed     Family History: The patient's family history includes CVA in her mother; Emphysema in her father; Heart disease in her mother; Stroke in her father.  ROS:   Please see the history of present illness.     All other systems reviewed and are negative.  EKGs/Labs/Other Studies Reviewed:    The following studies were reviewed today:  EKG  Interpretation Date/Time:  Tuesday April 14 2023 14:51:01 EST Ventricular Rate:  67 PR Interval:  182 QRS Duration:  72 QT Interval:  356 QTC Calculation: 376 R Axis:   -29  Text Interpretation: Normal sinus rhythm Minimal voltage criteria for LVH, may be normal variant ( R in aVL ) Cannot rule out Anterior infarct , age undetermined When compared with ECG of 22-Oct-2020 12:42, LVH is new. Confirmed by Dareen Gutzwiller [phone]) on 04/14/2023 3:24:55 PM    Echo 05/30/13: Study Conclusions   - Left ventricle: The cavity size was normal. Systolic    function was normal. The estimated ejection fraction was    in the range of 55% to 60%. Wall motion was normal; there    were no regional wall motion abnormalities.  - Atrial septum: No defect or patent foramen ovale was    identified.  EKG Interpretation Date/Time:  Tuesday April 14 2023 14:51:01 EST Ventricular Rate:  67 PR Interval:  182 QRS Duration:  72 QT Interval:  356 QTC Calculation: 376 R Axis:   -29  Text Interpretation: Normal sinus rhythm Minimal voltage criteria for LVH, may be normal variant ( R in aVL ) Cannot rule out Anterior infarct , age undetermined When compared with ECG of 22-Oct-2020 12:42, LVH is new. Confirmed by Shellby Schlink [id number]) on 04/14/2023 3:24:55 PM    Recent Labs: 09/22/2022: ALT 9 01/12/2023: BUN 8; Creatinine, Ser 0.94; Hemoglobin 14.0; Platelets 242; Potassium 4.0; Sodium 143  Recent Lipid Panel    Component Value Date/Time   CHOL 204 (H) 05/08/2022 1224   TRIG 183 (H) 05/08/2022 1224   HDL 64 05/08/2022 1224   CHOLHDL 3.2 05/08/2022 1224   CHOLHDL 2.6 05/30/2013 0629   VLDL 17 05/30/2013 0629   LDLCALC 109 (H) 05/08/2022 1224     Risk Assessment/Calculations:                Physical Exam:    VS:  BP 102/64   Pulse 67   Ht 5' 4 (1.626 Fernandez)   Wt 162 lb 12.8 oz (73.8 kg)   SpO2 96%   BMI 27.94 kg/Fernandez     Wt Readings from Last 3 Encounters:  04/14/23 162 lb 12.8 oz (73.8 kg)   01/12/23 158 lb 6.4 oz (71.8 kg)  10/23/22 162 lb 6.4 oz (73.7 kg)     GEN:  Well nourished, well developed in no acute distress HEENT: Normal NECK: No JVD; No carotid bruits LYMPHATICS: No lymphadenopathy CARDIAC: RRR, no murmurs, rubs, gallops RESPIRATORY:  Clear to auscultation without rales, wheezing or rhonchi  ABDOMEN: Soft, non-tender, non-distended MUSCULOSKELETAL:  No edema; No deformity  SKIN: Warm and dry NEUROLOGIC:  Alert and oriented x 3  PSYCHIATRIC:  Normal affect   ASSESSMENT:    1. Dysphagia, unspecified type   2. Ventral hernia without obstruction or gangrene   3. History of TIA (transient ischemic attack)    PLAN:    In order of problems listed above:  Pre op cardiac evaluation for ventral hernia repair. No active cardiac conditions. Able to function at > 5 mets level. Surgical risk is low based mainly on age. No further cardiac testing required. No concerns from a cardiac standpoint. Can hold Plavix  for 5 days prior to surgery. Follow up PRN           Medication Adjustments/Labs and Tests Ordered: Current medicines are reviewed at length with the patient today.  Concerns regarding medicines are outlined above.  Orders Placed This Encounter  Procedures   EKG 12-Lead   No orders of the defined types were placed in this encounter.   Patient Instructions  Medication Instructions:  Continue same medications   Lab Work: None ordered   Testing/Procedures: None ordered   Follow-Up: At [facility], you and your health needs are our priority.  As part of our continuing mission to provide you with exceptional heart care, we have created designated Provider Care Teams.  These Care Teams include your primary Cardiologist (physician) and Advanced Practice Providers (APPs -  Physician Assistants and Nurse Practitioners) who all work together to provide you with the care you need, when you need it.  We recommend signing up for the patient  portal called MyChart.  Sign up information is provided on this After Visit Summary.  MyChart is used to connect with patients for Virtual Visits (Telemedicine).  Patients are able to view lab/test results, encounter notes, upcoming appointments, etc.  Non-urgent messages can be sent to your provider as well.   To learn more about what you can do with MyChart, go to [email or web address].    Your next appointment:  As Needed    Provider:  Dr.Daneshia Tavano         Signed, Chace Bisch, MD  04/14/2023 3:39 PM    [facility]

## 2023-04-14 ENCOUNTER — Ambulatory Visit: Payer: Medicare Other | Attending: Cardiology | Admitting: Cardiology

## 2023-04-14 ENCOUNTER — Encounter: Payer: Self-pay | Admitting: Cardiology

## 2023-04-14 VITALS — BP 102/64 | HR 67 | Ht 64.0 in | Wt 162.8 lb

## 2023-04-14 DIAGNOSIS — K439 Ventral hernia without obstruction or gangrene: Secondary | ICD-10-CM | POA: Diagnosis not present

## 2023-04-14 DIAGNOSIS — Z8673 Personal history of transient ischemic attack (TIA), and cerebral infarction without residual deficits: Secondary | ICD-10-CM

## 2023-04-14 DIAGNOSIS — R1310 Dysphagia, unspecified: Secondary | ICD-10-CM

## 2023-04-14 NOTE — Patient Instructions (Signed)
 Medication Instructions:  Continue same medications  Lab Work: None ordered   Testing/Procedures: None ordered   Follow-Up: At [facility], you and your health needs are our priority.  As part of our continuing mission to provide you with exceptional heart care, we have created designated Provider Care Teams.  These Care Teams include your primary Cardiologist (physician) and Advanced Practice Providers (APPs -  Physician Assistants and Nurse Practitioners) who all work together to provide you with the care you need, when you need it.  We recommend signing up for the patient portal called "MyChart".  Sign up information is provided on this After Visit Summary.  MyChart is used to connect with patients for Virtual Visits (Telemedicine).  Patients are able to view lab/test results, encounter notes, upcoming appointments, etc.  Non-urgent messages can be sent to your provider as well.   To learn more about what you can do with MyChart, go to [email or web address].    Your next appointment:  As Needed    Provider: Dr.Jordan

## 2023-04-28 ENCOUNTER — Encounter (INDEPENDENT_AMBULATORY_CARE_PROVIDER_SITE_OTHER): Payer: Medicare Other | Admitting: Ophthalmology

## 2023-04-28 DIAGNOSIS — H43813 Vitreous degeneration, bilateral: Secondary | ICD-10-CM | POA: Diagnosis not present

## 2023-04-28 DIAGNOSIS — H353211 Exudative age-related macular degeneration, right eye, with active choroidal neovascularization: Secondary | ICD-10-CM

## 2023-04-28 DIAGNOSIS — H353122 Nonexudative age-related macular degeneration, left eye, intermediate dry stage: Secondary | ICD-10-CM

## 2023-05-20 DIAGNOSIS — K449 Diaphragmatic hernia without obstruction or gangrene: Secondary | ICD-10-CM | POA: Diagnosis not present

## 2023-05-26 ENCOUNTER — Encounter (INDEPENDENT_AMBULATORY_CARE_PROVIDER_SITE_OTHER): Payer: Medicare Other | Admitting: Ophthalmology

## 2023-05-26 DIAGNOSIS — H2512 Age-related nuclear cataract, left eye: Secondary | ICD-10-CM

## 2023-05-26 DIAGNOSIS — H353121 Nonexudative age-related macular degeneration, left eye, early dry stage: Secondary | ICD-10-CM

## 2023-05-26 DIAGNOSIS — H353211 Exudative age-related macular degeneration, right eye, with active choroidal neovascularization: Secondary | ICD-10-CM

## 2023-05-26 DIAGNOSIS — H43813 Vitreous degeneration, bilateral: Secondary | ICD-10-CM

## 2023-06-03 NOTE — Progress Notes (Signed)
 Surgery orders requested via Epic inbox.

## 2023-06-09 NOTE — Progress Notes (Signed)
 COVID Vaccine received:  []  No [x]  Yes Date of any COVID positive Test in last 90 days:  PCP - Arnette Felts, FNP  [phone] (Work)  [phone] (Fax)    Cardiologist - Peter [place], MD  cardiac clearance in 04-14-23 Epic note  Chest x-ray - 10-22-2020  1v  Epic EKG - 04-14-2023  Epic  Stress Test -  ECHO - 05-30-2013  Epic Cardiac Cath -   PCR screen: []  Ordered & Completed []   No Order but Needs PROFEND     []   N/A for this surgery  Surgery Plan:  []  Ambulatory   [x]  Outpatient in bed  []  Admit Anesthesia:    [x]  General  []  Spinal  []   Choice []   MAC  Bowel Prep - [x]  No  []   Yes ______  Pacemaker / ICD device [x]  No []  Yes   Spinal Cord Stimulator:[x]  No []  Yes       History of Sleep Apnea? [x]  No []  Yes   CPAP used?- [x]  No []  Yes    Does the patient monitor blood sugar?   [x]  N/A   []  No []  Yes  Patient has: [x]  NO Hx DM   []  Pre-DM   []  DM1  []   DM2 Last A1c was: 5.6 normal on  01-12-2023      Blood Thinner / Instructions:Plavix  Hold x 5 days Last dose : Wednesday 06-10-2023 Aspirin Instructions:  none  ERAS Protocol Ordered: []  No  [x]  Yes  per special needs on booking .  PRE-SURGERY []  ENSURE  []  G2   []  No Drink Ordered  Patient is to be NPO after:   NO ORDERS   Dental hx: []  Dentures:  []  N/A      []  Bridge or Partial:                   []  Loose or Damaged teeth:   Comments:   Activity level: Patient is able / unable to climb a flight of stairs without difficulty; []  No CP  []  No SOB, but would have ___   Patient can / can not perform ADLs without assistance.   Anesthesia review: hx TIA, MDD, macular degeneration, cervical spondylosis, LVH on ECHO.   Patient denies shortness of breath, fever, cough and chest pain at PAT appointment.  Patient verbalized understanding and agreement to the Pre-Surgical Instructions that were given to them at this PAT appointment. Patient was also educated of the need to review these PAT instructions again prior to her surgery.I  reviewed the appropriate phone numbers to call if they have any and questions or concerns.

## 2023-06-09 NOTE — Patient Instructions (Signed)
 SURGICAL WAITING ROOM VISITATION Patients having surgery or a procedure may have no more than 2 support people in the waiting area - these visitors may rotate in the visitor waiting room.   Due to an increase in RSV and influenza rates and associated hospitalizations, children ages 105 and under may not visit patients in [facility] hospitals. If the patient needs to stay at the hospital during part of their recovery, the visitor guidelines for inpatient rooms apply.  PRE-OP VISITATION  Pre-op nurse will coordinate an appropriate time for 1 support person to accompany the patient in pre-op.  This support person may not rotate.  This visitor will be contacted when the time is appropriate for the visitor to come back in the pre-op area.  Please refer to the [facility] website for the visitor guidelines for Inpatients (after your surgery is over and you are in a regular room).  You are not required to quarantine at this time prior to your surgery. However, you must do this: Hand Hygiene often Do NOT share personal items Notify your provider if you are in close contact with someone who has COVID or you develop fever 100.4 or greater, new onset of sneezing, cough, sore throat, shortness of breath or body aches.  If you test positive for Covid or have been in contact with anyone that has tested positive in the last 10 days please notify you surgeon.    Your procedure is scheduled on:  Tuesday  June 16, 2023  Report to [facility] Main Entrance: [address] entrance where the [employer] is available.   Report to admitting at:  11:15   AM  Call this number if you have any questions or problems the morning of surgery [phone]  FOLLOW ANY ADDITIONAL PRE OP INSTRUCTIONS YOU RECEIVED FROM YOUR SURGEON'S OFFICE!!!  Do not eat food after Midnight the night prior to your surgery/procedure.  After Midnight you may have the following liquids until  ?????11:15 AM DAY OF SURGERY  Clear  Liquid Diet Water Black Coffee (sugar ok, NO MILK/CREAM OR CREAMERS)  Tea (sugar ok, NO MILK/CREAM OR CREAMERS) regular and decaf                             Plain Jell-O  with no fruit (NO RED)                                           Fruit ices (not with fruit pulp, NO RED)                                     Popsicles (NO RED)                                                                  Juice: NO CITRUS JUICES: only apple, WHITE grape, WHITE cranberry Sports drinks like Gatorade or Powerade (NO RED)                   The day of surgery:  Drink ONE (  1) Pre-Surgery Clear Ensure at ???? 10:30 AM the morning of surgery. Drink in one sitting. Do not sip.  This drink was given to you during your hospital pre-op appointment visit. Nothing else to drink after completing the Pre-Surgery Clear Ensure : No candy, chewing gum or throat lozenges.     Oral Hygiene is also important to reduce your risk of infection.        Remember - BRUSH YOUR TEETH THE MORNING OF SURGERY WITH YOUR REGULAR TOOTHPASTE  Do NOT smoke after Midnight the night before surgery.  PLAVIX- stop taking PLAVIX 5 days before your surgery. Last dose will be taken on Wednesday 06-10-2023  STOP TAKING all Vitamins, Herbs and supplements 1 week before your surgery.   Take ONLY these medicines the morning of surgery with A SIP OF WATER: cetirizine, topiramate (Topamax), and Tylenol if needed. You may use your Eye drops and the Flonase nasal spray if needed.                    You may not have any metal on your body including hair pins, jewelry, and body piercing  Do not wear make-up, lotions, powders, perfumes  or deodorant  Do not wear nail polish including gel and S&S, artificial / acrylic nails, or any other type of covering on natural nails including finger and toenails. If you have artificial nails, gel coating, etc., that needs to be removed by a nail salon, Please have this removed prior to surgery. Not doing so may  mean that your surgery could be cancelled or delayed if the Surgeon or anesthesia staff feels like they are unable to monitor you safely.   Do not shave 48 hours prior to surgery to avoid nicks in your skin which may contribute to postoperative infections.   Contacts, Hearing Aids, dentures or bridgework may not be worn into surgery. DENTURES WILL BE REMOVED PRIOR TO SURGERY PLEASE DO NOT APPLY "Poly grip" OR ADHESIVES!!!  You may bring a small overnight bag with you on the day of surgery, only pack items that are not valuable. [facility] IS NOT RESPONSIBLE   FOR VALUABLES THAT ARE LOST OR STOLEN.   Do not bring your home medications to the hospital. The Pharmacy will dispense medications listed on your medication list to you during your admission in the Hospital.  Special Instructions: Bring a copy of your healthcare power of attorney and living will documents the day of surgery, if you wish to have them scanned into your  Medical Records- EPIC  Please read over the following fact sheets you were given: IF YOU HAVE QUESTIONS ABOUT YOUR PRE-OP INSTRUCTIONS, PLEASE CALL [phone]   [facility] - Preparing for Surgery Before surgery, you can play an important role.  Because skin is not sterile, your skin needs to be as free of germs as possible.  You can reduce the number of germs on your skin by washing with CHG (chlorahexidine gluconate) soap before surgery.  CHG is an antiseptic cleaner which kills germs and bonds with the skin to continue killing germs even after washing. Please DO NOT use if you have an allergy to CHG or antibacterial soaps.  If your skin becomes reddened/irritated stop using the CHG and inform your nurse when you arrive at Short Stay. Do not shave (including legs and underarms) for at least 48 hours prior to the first CHG shower.  You may shave your face/neck.  Please follow these instructions carefully:  1.  Shower  with CHG Soap the night before surgery and  the  morning of surgery.  2.  If you choose to wash your hair, wash your hair first as usual with your normal  shampoo.  3.  After you shampoo, rinse your hair and body thoroughly to remove the shampoo.                             4.  Use CHG as you would any other liquid soap.  You can apply chg directly to the skin and wash.  Gently with a scrungie or clean washcloth.  5.  Apply the CHG Soap to your body ONLY FROM THE NECK DOWN.   Do not use on face/ open                           Wound or open sores. Avoid contact with eyes, ears mouth and genitals (private parts).                       Wash face,  Genitals (private parts) with your normal soap.             6.  Wash thoroughly, paying special attention to the area where your  surgery  will be performed.  7.  Thoroughly rinse your body with warm water from the neck down.  8.  DO NOT shower/wash with your normal soap after using and rinsing off the CHG Soap.            9.  Pat yourself dry with a clean towel.            10.  Wear clean pajamas.            11.  Place clean sheets on your bed the night of your first shower and do not  sleep with pets.  ON THE DAY OF SURGERY : Do not apply any lotions/deodorants the morning of surgery.  Please wear clean clothes to the hospital/surgery center.     FAILURE TO FOLLOW THESE INSTRUCTIONS MAY RESULT IN THE CANCELLATION OF YOUR SURGERY  PATIENT SIGNATURE_________________________________  NURSE SIGNATURE__________________________________  ________________________________________________________________________

## 2023-06-10 ENCOUNTER — Ambulatory Visit: Payer: Self-pay | Admitting: General Surgery

## 2023-06-10 ENCOUNTER — Other Ambulatory Visit: Payer: Self-pay

## 2023-06-10 ENCOUNTER — Encounter (HOSPITAL_COMMUNITY)
Admission: RE | Admit: 2023-06-10 | Discharge: 2023-06-10 | Disposition: A | Discharge status: Home / Self Care | Source: Ambulatory Visit | Attending: General Surgery | Admitting: General Surgery

## 2023-06-10 ENCOUNTER — Encounter (HOSPITAL_COMMUNITY): Payer: Self-pay

## 2023-06-10 VITALS — BP 112/62 | HR 64 | Temp 97.6°F | Resp 16 | Ht 64.0 in | Wt 160.0 lb

## 2023-06-10 DIAGNOSIS — Z01812 Encounter for preprocedural laboratory examination: Secondary | ICD-10-CM | POA: Diagnosis not present

## 2023-06-10 DIAGNOSIS — Z8673 Personal history of transient ischemic attack (TIA), and cerebral infarction without residual deficits: Secondary | ICD-10-CM | POA: Insufficient documentation

## 2023-06-10 DIAGNOSIS — Z01818 Encounter for other preprocedural examination: Secondary | ICD-10-CM

## 2023-06-10 DIAGNOSIS — K449 Diaphragmatic hernia without obstruction or gangrene: Secondary | ICD-10-CM | POA: Insufficient documentation

## 2023-06-10 DIAGNOSIS — J45909 Unspecified asthma, uncomplicated: Secondary | ICD-10-CM | POA: Diagnosis not present

## 2023-06-10 DIAGNOSIS — I2510 Atherosclerotic heart disease of native coronary artery without angina pectoris: Secondary | ICD-10-CM | POA: Diagnosis not present

## 2023-06-10 HISTORY — DX: Myoneural disorder, unspecified: G70.9

## 2023-06-10 HISTORY — DX: Unspecified macular degeneration: H35.30

## 2023-06-10 HISTORY — DX: Pneumonia, unspecified organism: J18.9

## 2023-06-10 HISTORY — DX: Unspecified asthma, uncomplicated: J45.909

## 2023-06-10 HISTORY — DX: Anemia, unspecified: D64.9

## 2023-06-10 HISTORY — DX: Cerebral infarction, unspecified: I63.9

## 2023-06-10 LAB — BASIC METABOLIC PANEL WITH GFR
Anion gap: 7 (ref 5–15)
BUN: 18 mg/dL (ref 8–23)
CO2: 24 mmol/L (ref 22–32)
Calcium: 9.9 mg/dL (ref 8.9–10.3)
Chloride: 108 mmol/L (ref 98–111)
Creatinine, Ser: 1.09 mg/dL — ABNORMAL HIGH (ref 0.44–1.00)
GFR, Estimated: 51 mL/min — ABNORMAL LOW (ref >60)
Glucose, Bld: 88 mg/dL (ref 70–99)
Potassium: 4.3 mmol/L (ref 3.5–5.1)
Sodium: 139 mmol/L (ref 135–145)

## 2023-06-10 LAB — CBC
HCT: 42.6 % (ref 36.0–46.0)
Hemoglobin: 13.6 g/dL (ref 12.0–15.0)
MCH: 30.7 pg (ref 26.0–34.0)
MCHC: 31.9 g/dL (ref 30.0–36.0)
MCV: 96.2 fL (ref 80.0–100.0)
Platelets: 230 10*3/uL (ref 150–400)
RBC: 4.43 MIL/uL (ref 3.87–5.11)
RDW: 13.4 % (ref 11.5–15.5)
WBC: 5.5 10*3/uL (ref 4.0–10.5)
nRBC: 0 % (ref 0.0–0.2)

## 2023-06-11 NOTE — Progress Notes (Signed)
 Anesthesia Chart Review   Case: [id number] Date/Time: 06/16/23 1315   Procedures:      REPAIR, HERNIA, HIATAL, ROBOT-ASSISTED - ROBOTIC HIATAL HERNIA REPAIR WITH FUNDOPLICATION     INSERTION, GASTROSTOMY TUBE, PERCUTANEOUS - LAPAROSCOPIC G TUBE INSERTION CREATION   Anesthesia type: General   Diagnosis: Hiatal hernia [K44.9]   Pre-op diagnosis: HIATAL HERNIA GERD   Location: [facility] ROOM 02 / [facility]   Surgeons: Kinsinger, De Blanch, MD       DISCUSSION:83 y.o. never smoker with h/o asthma, TIAs, hiatal hernia scheduled for above procedure 06/16/2023 with Dr. Feliciana Rossetti.   Pt seen by cardiology 04/14/2023 for preoperative evaluation.  Per OV note, "Pre op cardiac evaluation for ventral hernia repair. No active cardiac conditions. Able to function at > 5 mets level. Surgical risk is low based mainly on age. No further cardiac testing required. No concerns from a cardiac standpoint. Can hold Plavix for 5 days prior to surgery. Follow up PRN "  VS: BP 112/62 Comment: right arm sitting  Pulse 64   Temp 36.4 C (Oral)   Resp 16   Ht 5\' 4"  (1.626 m)   Wt 72.6 kg   SpO2 100%   BMI 27.46 kg/m   PROVIDERS: Arnette Felts, FNP is PCP   Cardiologist - Peter Swaziland, MD  LABS: Labs reviewed: Acceptable for surgery. (all labs ordered are listed, but only abnormal results are displayed)  Labs Reviewed  BASIC METABOLIC PANEL WITH GFR - Abnormal; Notable for the following components:      Result Value   Creatinine, Ser 1.09 (*)    GFR, Estimated 51 (*)    All other components within normal limits  CBC     IMAGES:   EKG:   CV: Echo 05/30/2013 - Left ventricle: The cavity size was normal. Systolic    function was normal. The estimated ejection fraction was    in the range of 55% to 60%. Wall motion was normal; there    were no regional wall motion abnormalities.  - Atrial septum: No defect or patent foramen ovale was    identified.  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    Past Surgical History:  Procedure Laterality Date   ABDOMINAL HYSTERECTOMY     DILATION AND CURETTAGE OF UTERUS     HIATAL HERNIA REPAIR     done at [facility]   TONSILLECTOMY      MEDICATIONS:  acetaminophen (TYLENOL) 500 MG tablet   Calcium Carbonate-Vit D-Min (CALCIUM 1200 PO)   cetirizine (ZYRTEC) 10 MG tablet   Cholecalciferol (VITAMIN D) 2000 units tablet   clopidogrel (PLAVIX) 75 MG tablet   fluticasone (FLONASE) 50 MCG/ACT nasal spray   ketorolac (ACULAR) 0.5 % ophthalmic solution   Multiple Vitamins-Minerals (MULTIVITAMIN WOMEN 50+ PO)   ondansetron (ZOFRAN) 4 MG tablet   OVER THE COUNTER MEDICATION   Polyethyl Glycol-Propyl Glycol (SYSTANE OP)   topiramate (  TOPAMAX) 25 MG tablet   trimethoprim-polymyxin b (POLYTRIM) ophthalmic solution   No current facility-administered medications for this encounter.     Jodell Cipro Ward, PA-C [facility] Pre-Surgical Testing [phone]

## 2023-06-11 NOTE — Anesthesia Preprocedure Evaluation (Deleted)
 Anesthesia Evaluation    Airway        Dental   Pulmonary           Cardiovascular      Neuro/Psych    GI/Hepatic   Endo/Other    Renal/GU      Musculoskeletal   Abdominal   Peds  Hematology   Anesthesia Other Findings   Reproductive/Obstetrics                              Anesthesia Physical Anesthesia Plan Anesthesia Quick Evaluation

## 2023-06-16 ENCOUNTER — Encounter (HOSPITAL_COMMUNITY): Payer: Self-pay | Admitting: General Surgery

## 2023-06-16 ENCOUNTER — Other Ambulatory Visit: Payer: Self-pay

## 2023-06-16 ENCOUNTER — Ambulatory Visit (HOSPITAL_BASED_OUTPATIENT_CLINIC_OR_DEPARTMENT_OTHER): Admitting: Anesthesiology

## 2023-06-16 ENCOUNTER — Ambulatory Visit (HOSPITAL_COMMUNITY): Payer: Self-pay | Admitting: Physician Assistant

## 2023-06-16 ENCOUNTER — Observation Stay (HOSPITAL_COMMUNITY)
Admission: RE | Admit: 2023-06-16 | Discharge: 2023-06-17 | Disposition: A | Discharge status: Home / Self Care | Source: Ambulatory Visit | Attending: General Surgery | Admitting: General Surgery

## 2023-06-16 ENCOUNTER — Encounter (HOSPITAL_COMMUNITY)
Admission: RE | Disposition: A | Discharge status: Home / Self Care | Payer: Self-pay | Source: Ambulatory Visit | Attending: General Surgery

## 2023-06-16 DIAGNOSIS — Z8673 Personal history of transient ischemic attack (TIA), and cerebral infarction without residual deficits: Secondary | ICD-10-CM | POA: Insufficient documentation

## 2023-06-16 DIAGNOSIS — Z7901 Long term (current) use of anticoagulants: Secondary | ICD-10-CM | POA: Diagnosis not present

## 2023-06-16 DIAGNOSIS — K449 Diaphragmatic hernia without obstruction or gangrene: Secondary | ICD-10-CM

## 2023-06-16 DIAGNOSIS — I639 Cerebral infarction, unspecified: Secondary | ICD-10-CM | POA: Diagnosis not present

## 2023-06-16 DIAGNOSIS — K219 Gastro-esophageal reflux disease without esophagitis: Secondary | ICD-10-CM | POA: Diagnosis not present

## 2023-06-16 DIAGNOSIS — J189 Pneumonia, unspecified organism: Secondary | ICD-10-CM | POA: Diagnosis not present

## 2023-06-16 DIAGNOSIS — R12 Heartburn: Secondary | ICD-10-CM | POA: Diagnosis present

## 2023-06-16 HISTORY — PX: XI ROBOTIC ASSISTED HIATAL HERNIA REPAIR: SHX6889

## 2023-06-16 HISTORY — PX: LAPAROSCOPIC INSERTION GASTROSTOMY TUBE: SHX6817

## 2023-06-16 LAB — CBC
HCT: 41 % (ref 36.0–46.0)
Hemoglobin: 13.3 g/dL (ref 12.0–15.0)
MCH: 31.2 pg (ref 26.0–34.0)
MCHC: 32.4 g/dL (ref 30.0–36.0)
MCV: 96.2 fL (ref 80.0–100.0)
Platelets: 177 10*3/uL (ref 150–400)
RBC: 4.26 MIL/uL (ref 3.87–5.11)
RDW: 13.8 % (ref 11.5–15.5)
WBC: 11.2 10*3/uL — ABNORMAL HIGH (ref 4.0–10.5)
nRBC: 0 % (ref 0.0–0.2)

## 2023-06-16 LAB — CREATININE, SERUM
Creatinine, Ser: 0.81 mg/dL (ref 0.44–1.00)
GFR, Estimated: >60 mL/min (ref >60)

## 2023-06-16 SURGERY — REPAIR, HERNIA, HIATAL, ROBOT-ASSISTED
Anesthesia: General

## 2023-06-16 MED ORDER — TOPIRAMATE 25 MG PO TABS: 25.0000 mg | ORAL_TABLET | Freq: Every day | ORAL | Status: DC

## 2023-06-16 MED ORDER — PANTOPRAZOLE SODIUM 40 MG IV SOLR
40.0000 mg | Freq: Every day | INTRAVENOUS | Status: DC
  Administered 2023-06-16: 40 mg via INTRAVENOUS
  Filled 2023-06-16: qty 10

## 2023-06-16 MED ORDER — LIDOCAINE HCL (PF) 2 % IJ SOLN
INTRAMUSCULAR | Status: DC | PRN
  Administered 2023-06-16: 60 mg via INTRADERMAL

## 2023-06-16 MED ORDER — ENSURE ENLIVE PO LIQD: 237.0000 mL | Freq: Two times a day (BID) | ORAL | Status: DC

## 2023-06-16 MED ORDER — CHLORHEXIDINE GLUCONATE CLOTH 2 % EX PADS: 6.0000 | MEDICATED_PAD | Freq: Once | CUTANEOUS | Status: DC

## 2023-06-16 MED ORDER — OXYCODONE HCL 5 MG PO TABS
ORAL_TABLET | ORAL | Status: AC
Start: 2023-06-16 — End: 2023-06-16
  Administered 2023-06-16: 5 mg via ORAL
  Filled 2023-06-16: qty 1

## 2023-06-16 MED ORDER — PROPOFOL 10 MG/ML IV BOLUS
INTRAVENOUS | Status: DC | PRN
  Administered 2023-06-16: 25 mg via INTRAVENOUS

## 2023-06-16 MED ORDER — BUPIVACAINE-EPINEPHRINE (PF) 0.25% -1:200000 IJ SOLN
INTRAMUSCULAR | Status: AC
  Filled 2023-06-16: qty 30

## 2023-06-16 MED ORDER — SUGAMMADEX SODIUM 200 MG/2ML IV SOLN
INTRAVENOUS | Status: DC | PRN
Start: 2023-06-16 — End: 2023-06-16
  Administered 2023-06-16: 200 mg via INTRAVENOUS

## 2023-06-16 MED ORDER — CHLORHEXIDINE GLUCONATE CLOTH 2 % EX PADS
6.0000 | MEDICATED_PAD | Freq: Once | CUTANEOUS | Status: DC
Start: 2023-06-16 — End: 2023-06-16

## 2023-06-16 MED ORDER — PHENYLEPHRINE HCL (PRESSORS) 10 MG/ML IV SOLN
INTRAVENOUS | Status: AC
  Filled 2023-06-16: qty 1

## 2023-06-16 MED ORDER — FLUTICASONE PROPIONATE 50 MCG/ACT NA SUSP: 2.0000 | Freq: Every day | NASAL | Status: DC | PRN

## 2023-06-16 MED ORDER — FENTANYL CITRATE (PF) 100 MCG/2ML IJ SOLN
INTRAMUSCULAR | Status: DC | PRN
  Administered 2023-06-16: 75 ug via INTRAVENOUS
  Administered 2023-06-16: 25 ug via INTRAVENOUS

## 2023-06-16 MED ORDER — ONDANSETRON 4 MG PO TBDP: 4.0000 mg | ORAL_TABLET | Freq: Four times a day (QID) | ORAL | Status: DC | PRN

## 2023-06-16 MED ORDER — PHENYLEPHRINE HCL-NACL 20-0.9 MG/250ML-% IV SOLN
INTRAVENOUS | Status: DC | PRN
Start: 2023-06-16 — End: 2023-06-16
  Administered 2023-06-16: 30 ug/min via INTRAVENOUS

## 2023-06-16 MED ORDER — KETOROLAC TROMETHAMINE 0.5 % OP SOLN: 1.0000 [drp] | Freq: Four times a day (QID) | OPHTHALMIC | Status: DC | PRN

## 2023-06-16 MED ORDER — HYDROMORPHONE HCL 1 MG/ML IJ SOLN: 0.5000 mg | INTRAMUSCULAR | Status: DC | PRN

## 2023-06-16 MED ORDER — DEXAMETHASONE SODIUM PHOSPHATE 4 MG/ML IJ SOLN
INTRAMUSCULAR | Status: DC | PRN
  Administered 2023-06-16: 4 mg via INTRAVENOUS

## 2023-06-16 MED ORDER — OXYCODONE HCL 5 MG PO TABS
2.5000 mg | ORAL_TABLET | ORAL | Status: DC | PRN
  Administered 2023-06-16 – 2023-06-17 (×2): 5 mg via ORAL
  Filled 2023-06-16 (×2): qty 1

## 2023-06-16 MED ORDER — FENTANYL CITRATE PF 50 MCG/ML IJ SOSY
PREFILLED_SYRINGE | INTRAMUSCULAR | Status: AC
  Administered 2023-06-16: 50 ug via INTRAVENOUS
  Filled 2023-06-16: qty 1

## 2023-06-16 MED ORDER — FENTANYL CITRATE PF 50 MCG/ML IJ SOSY
PREFILLED_SYRINGE | INTRAMUSCULAR | Status: AC
  Administered 2023-06-16: 50 ug via INTRAVENOUS
  Filled 2023-06-16: qty 2

## 2023-06-16 MED ORDER — BUPIVACAINE-EPINEPHRINE 0.25% -1:200000 IJ SOLN
INTRAMUSCULAR | Status: DC | PRN
  Administered 2023-06-16: 30 mL

## 2023-06-16 MED ORDER — ONDANSETRON HCL 4 MG/2ML IJ SOLN
INTRAMUSCULAR | Status: AC
  Filled 2023-06-16: qty 2

## 2023-06-16 MED ORDER — TOPIRAMATE 25 MG PO TABS
75.0000 mg | ORAL_TABLET | Freq: Every day | ORAL | Status: DC
  Administered 2023-06-16: 75 mg via ORAL
  Filled 2023-06-16: qty 3

## 2023-06-16 MED ORDER — OXYCODONE HCL 5 MG/5ML PO SOLN: 5.0000 mg | Freq: Once | ORAL | Status: AC | PRN

## 2023-06-16 MED ORDER — ORAL CARE MOUTH RINSE: 15.0000 mL | Freq: Once | OROMUCOSAL | Status: AC

## 2023-06-16 MED ORDER — MEPERIDINE HCL 50 MG/ML IJ SOLN: 6.2500 mg | INTRAMUSCULAR | Status: DC | PRN

## 2023-06-16 MED ORDER — FENTANYL CITRATE PF 50 MCG/ML IJ SOSY
25.0000 ug | PREFILLED_SYRINGE | INTRAMUSCULAR | Status: DC | PRN
  Administered 2023-06-16: 50 ug via INTRAVENOUS

## 2023-06-16 MED ORDER — CHLORHEXIDINE GLUCONATE 0.12 % MT SOLN
15.0000 mL | Freq: Once | OROMUCOSAL | Status: AC
  Administered 2023-06-16: 15 mL via OROMUCOSAL

## 2023-06-16 MED ORDER — ENOXAPARIN SODIUM 40 MG/0.4ML IJ SOSY
40.0000 mg | PREFILLED_SYRINGE | INTRAMUSCULAR | Status: DC
  Filled 2023-06-16: qty 0.4

## 2023-06-16 MED ORDER — OXYCODONE HCL 5 MG PO TABS: 5.0000 mg | ORAL_TABLET | Freq: Once | ORAL | Status: AC | PRN

## 2023-06-16 MED ORDER — PHENYLEPHRINE 80 MCG/ML (10ML) SYRINGE FOR IV PUSH (FOR BLOOD PRESSURE SUPPORT)
PREFILLED_SYRINGE | INTRAVENOUS | Status: DC | PRN
  Administered 2023-06-16 (×2): 80 ug via INTRAVENOUS

## 2023-06-16 MED ORDER — HYDROMORPHONE HCL 1 MG/ML IJ SOLN
INTRAMUSCULAR | Status: DC | PRN
  Administered 2023-06-16: .3 mg via INTRAVENOUS

## 2023-06-16 MED ORDER — CEFAZOLIN SODIUM-DEXTROSE 2-4 GM/100ML-% IV SOLN
2.0000 g | INTRAVENOUS | Status: AC
  Administered 2023-06-16: 2 g via INTRAVENOUS
  Filled 2023-06-16: qty 100

## 2023-06-16 MED ORDER — FENTANYL CITRATE (PF) 100 MCG/2ML IJ SOLN
INTRAMUSCULAR | Status: AC
  Filled 2023-06-16: qty 2

## 2023-06-16 MED ORDER — ROCURONIUM BROMIDE 100 MG/10ML IV SOLN
INTRAVENOUS | Status: DC | PRN
  Administered 2023-06-16: 5 mg via INTRAVENOUS
  Administered 2023-06-16: 70 mg via INTRAVENOUS

## 2023-06-16 MED ORDER — ACETAMINOPHEN 500 MG PO TABS
1000.0000 mg | ORAL_TABLET | Freq: Four times a day (QID) | ORAL | Status: DC
  Administered 2023-06-16 – 2023-06-17 (×3): 1000 mg via ORAL
  Filled 2023-06-16 (×3): qty 2

## 2023-06-16 MED ORDER — LACTATED RINGERS IR SOLN
Status: DC | PRN
Start: 2023-06-16 — End: 2023-06-16
  Administered 2023-06-16: 1000 mL

## 2023-06-16 MED ORDER — POLYETHYLENE GLYCOL 3350 17 G PO PACK: 17.0000 g | PACK | Freq: Every day | ORAL | Status: DC | PRN

## 2023-06-16 MED ORDER — ALBUMIN HUMAN 5 % IV SOLN: INTRAVENOUS | Status: DC | PRN

## 2023-06-16 MED ORDER — LIDOCAINE HCL (PF) 2 % IJ SOLN
INTRAMUSCULAR | Status: AC
  Filled 2023-06-16: qty 5

## 2023-06-16 MED ORDER — PHENYLEPHRINE 80 MCG/ML (10ML) SYRINGE FOR IV PUSH (FOR BLOOD PRESSURE SUPPORT)
PREFILLED_SYRINGE | INTRAVENOUS | Status: AC
  Filled 2023-06-16: qty 10

## 2023-06-16 MED ORDER — LACTATED RINGERS IV SOLN: INTRAVENOUS | Status: DC

## 2023-06-16 MED ORDER — ONDANSETRON HCL 4 MG/2ML IJ SOLN
INTRAMUSCULAR | Status: DC | PRN
  Administered 2023-06-16: 4 mg via INTRAVENOUS

## 2023-06-16 MED ORDER — METOPROLOL TARTRATE 5 MG/5ML IV SOLN: 5.0000 mg | Freq: Four times a day (QID) | INTRAVENOUS | Status: DC | PRN

## 2023-06-16 MED ORDER — TRAMADOL HCL 50 MG PO TABS: 50.0000 mg | ORAL_TABLET | Freq: Four times a day (QID) | ORAL | Status: DC | PRN

## 2023-06-16 MED ORDER — LORATADINE 10 MG PO TABS
10.0000 mg | ORAL_TABLET | Freq: Every day | ORAL | Status: DC
  Administered 2023-06-16: 10 mg via ORAL
  Filled 2023-06-16: qty 1

## 2023-06-16 MED ORDER — ARTIFICIAL TEARS OPHTHALMIC OINT: TOPICAL_OINTMENT | Freq: Every day | OPHTHALMIC | Status: DC | PRN

## 2023-06-16 MED ORDER — ROCURONIUM BROMIDE 10 MG/ML (PF) SYRINGE
PREFILLED_SYRINGE | INTRAVENOUS | Status: AC
  Filled 2023-06-16: qty 10

## 2023-06-16 MED ORDER — HYDROMORPHONE HCL 2 MG/ML IJ SOLN
INTRAMUSCULAR | Status: AC
  Filled 2023-06-16: qty 1

## 2023-06-16 MED ORDER — DEXAMETHASONE SODIUM PHOSPHATE 10 MG/ML IJ SOLN
INTRAMUSCULAR | Status: AC
  Filled 2023-06-16: qty 1

## 2023-06-16 MED ORDER — DIPHENHYDRAMINE HCL 12.5 MG/5ML PO ELIX: 12.5000 mg | ORAL_SOLUTION | Freq: Four times a day (QID) | ORAL | Status: DC | PRN

## 2023-06-16 MED ORDER — DIPHENHYDRAMINE HCL 50 MG/ML IJ SOLN: 12.5000 mg | Freq: Four times a day (QID) | INTRAMUSCULAR | Status: DC | PRN

## 2023-06-16 MED ORDER — ONDANSETRON HCL 4 MG/2ML IJ SOLN: 4.0000 mg | Freq: Four times a day (QID) | INTRAMUSCULAR | Status: DC | PRN

## 2023-06-16 MED ORDER — ONDANSETRON HCL 4 MG/2ML IJ SOLN: 4.0000 mg | Freq: Once | INTRAMUSCULAR | Status: DC | PRN

## 2023-06-16 MED ORDER — ACETAMINOPHEN 500 MG PO TABS
1000.0000 mg | ORAL_TABLET | ORAL | Status: AC
  Administered 2023-06-16: 1000 mg via ORAL
  Filled 2023-06-16: qty 2

## 2023-06-16 SURGICAL SUPPLY — 74 items
ANTIFOG SOL W/FOAM PAD STRL (MISCELLANEOUS) ×1 IMPLANT
BAG COUNTER SPONGE SURGICOUNT (BAG) ×1 IMPLANT
BENZOIN TINCTURE PRP APPL 2/3 (GAUZE/BANDAGES/DRESSINGS) IMPLANT
BLADE SURG SZ11 CARB STEEL (BLADE) ×1 IMPLANT
BNDG ADH 1X3 SHEER STRL LF (GAUZE/BANDAGES/DRESSINGS) ×2 IMPLANT
CABLE HIGH FREQUENCY MONO STRZ (ELECTRODE) IMPLANT
CHLORAPREP W/TINT 26 (MISCELLANEOUS) ×1 IMPLANT
COVER SURGICAL LIGHT HANDLE (MISCELLANEOUS) ×1 IMPLANT
COVER TIP SHEARS 8 DVNC (MISCELLANEOUS) IMPLANT
DERMABOND ADVANCED .7 DNX12 (GAUZE/BANDAGES/DRESSINGS) ×1 IMPLANT
DRAIN PENROSE 0.5X18 (DRAIN) IMPLANT
DRAPE ARM DVNC X/XI (DISPOSABLE) ×4 IMPLANT
DRAPE COLUMN DVNC XI (DISPOSABLE) ×1 IMPLANT
DRAPE SURG ORHT 6 SPLT 77X108 (DRAPES) ×1 IMPLANT
DRIVER NDL LRG 8 DVNC XI (INSTRUMENTS) ×1 IMPLANT
DRIVER NDL MEGA SUTCUT DVNCXI (INSTRUMENTS) ×1 IMPLANT
DRIVER NDLE LRG 8 DVNC XI (INSTRUMENTS) ×1 IMPLANT
DRIVER NDLE MEGA SUTCUT DVNCXI (INSTRUMENTS) ×1 IMPLANT
ELECT PENCIL ROCKER SW 15FT (MISCELLANEOUS) ×1 IMPLANT
ELECT REM PT RETURN 15FT ADLT (MISCELLANEOUS) ×1 IMPLANT
FORCEPS BPLR FENES DVNC XI (FORCEP) IMPLANT
FORCEPS CADIERE DVNC XI (FORCEP) ×1 IMPLANT
G-TUBE MIC 18FR ENFIT ADLT (TUBING) IMPLANT
G-TUBE MIC BOLUS 18FR ENFIT (TUBING) IMPLANT
G-TUBE MIC BOLUS 22FR ENFIT (TUBING) IMPLANT
G-TUBE MIC BOLUS 24FR ENFIT (CATHETERS) IMPLANT
GAUZE 4X4 16PLY ~~LOC~~+RFID DBL (SPONGE) ×1 IMPLANT
GLOVE BIOGEL PI IND STRL 7.0 (GLOVE) ×2 IMPLANT
GLOVE SURG SS PI 7.0 STRL IVOR (GLOVE) ×2 IMPLANT
GOWN STRL REUS W/ TWL LRG LVL3 (GOWN DISPOSABLE) ×2 IMPLANT
GOWN STRL REUS W/ TWL XL LVL3 (GOWN DISPOSABLE) IMPLANT
GRASPER SUT TROCAR 14GX15 (MISCELLANEOUS) ×1 IMPLANT
GRASPER TIP-UP FEN DVNC XI (INSTRUMENTS) ×1 IMPLANT
IRRIG SUCT STRYKERFLOW 2 WTIP (MISCELLANEOUS) ×1 IMPLANT
IRRIGATION SUCT STRKRFLW 2 WTP (MISCELLANEOUS) ×1 IMPLANT
KIT BASIN OR (CUSTOM PROCEDURE TRAY) ×1 IMPLANT
KIT TURNOVER KIT A (KITS) IMPLANT
LUBRICANT JELLY K Y 4OZ (MISCELLANEOUS) IMPLANT
MARKER SKIN DUAL TIP RULER LAB (MISCELLANEOUS) ×1 IMPLANT
MESH BIO-A 7X10 SYN MAT (Mesh General) IMPLANT
NDL HYPO 22X1.5 SAFETY MO (MISCELLANEOUS) ×1 IMPLANT
NEEDLE HYPO 22X1.5 SAFETY MO (MISCELLANEOUS) ×1 IMPLANT
PACK CARDIOVASCULAR III (CUSTOM PROCEDURE TRAY) ×1 IMPLANT
SCISSORS LAP 5X35 DISP (ENDOMECHANICALS) IMPLANT
SCISSORS MNPLR CVD DVNC XI (INSTRUMENTS) ×1 IMPLANT
SEAL UNIV 5-12 XI (MISCELLANEOUS) ×3 IMPLANT
SEALER VESSEL EXT DVNC XI (MISCELLANEOUS) ×1 IMPLANT
SET IRRIG Y TYPE TUR BLADDER L (SET/KITS/TRAYS/PACK) IMPLANT
SET TUBE SMOKE EVAC HIGH FLOW (TUBING) ×1 IMPLANT
SLEEVE Z-THREAD 5X100MM (TROCAR) ×1 IMPLANT
SOL ELECTROSURG ANTI STICK (MISCELLANEOUS) IMPLANT
SOLUTION ANTFG W/FOAM PAD STRL (MISCELLANEOUS) ×1 IMPLANT
SOLUTION ELECTROSURG ANTI STCK (MISCELLANEOUS) IMPLANT
SPIKE FLUID TRANSFER (MISCELLANEOUS) ×1 IMPLANT
STRIP CLOSURE SKIN 1/2X4 (GAUZE/BANDAGES/DRESSINGS) IMPLANT
SUT ETHIBOND 0 36 GRN (SUTURE) ×2 IMPLANT
SUT ETHILON 2 0 PS N (SUTURE) IMPLANT
SUT MNCRL AB 4-0 PS2 18 (SUTURE) ×1 IMPLANT
SUT SILK 0 SH 30 (SUTURE) IMPLANT
SUT SILK 2 0 SH (SUTURE) ×3 IMPLANT
SUT SILK 3 0 SH 30 (SUTURE) ×5 IMPLANT
SYR 20ML LL LF (SYRINGE) ×2 IMPLANT
TIP INNERVISION DETACH 40FR (MISCELLANEOUS) IMPLANT
TIP INNERVISION DETACH 50FR (MISCELLANEOUS) IMPLANT
TIP INNERVISION DETACH 56FR (MISCELLANEOUS) IMPLANT
TOWEL OR 17X26 10 PK STRL BLUE (TOWEL DISPOSABLE) ×1 IMPLANT
TRAY FOLEY MTR SLVR 16FR STAT (SET/KITS/TRAYS/PACK) IMPLANT
TRAY LAPAROSCOPIC (CUSTOM PROCEDURE TRAY) ×1 IMPLANT
TROCAR KII 8X100ML NONTHREADED (TROCAR) ×1 IMPLANT
TROCAR Z-THREAD OPTICAL 5X100M (TROCAR) ×1 IMPLANT
TUBE GASTRO 18FR ENFIT (TUBING) IMPLANT
TUBE GASTRO BOLUS 18FR ENFIT (TUBING) ×1 IMPLANT
TUBE GASTRO BOLUS 22FR ENFIT (TUBING) IMPLANT
TUBE GASTRO BOLUS 24FR ENFIT (CATHETERS) IMPLANT

## 2023-06-16 NOTE — Op Note (Signed)
 Preoperative diagnosis: recurrent hiatal hernia  Postoperative diagnosis: same   Procedure: robotic redo paraesophageal hernia repair, robotic placement of mesh, laparoscopic gastrostomy tube creation  Surgeon: Feliciana Rossetti, M.D.  Asst: Ivar Drape, M.D.  Anesthesia: general  Indications for procedure: [name] is a 83 y.o. year old female with symptoms of nausea, indigestion and regurgitation. She had findings of recurrent hiatal hernia and presents for repair.  Description of procedure: The patient was brought into the operative suite. Anesthesia was administered with General endotracheal anesthesia. WHO checklist was applied. The patient was then placed in supine position. The area was prepped and draped in the usual sterile fashion.  Next, a right mid abdominal incision was made. A 5 mm trocar was used to gain access to the peritoneal cavity by optical entry technique. Pneumoperitoneum was applied with a high flow and low pressure. The laparoscope was reinserted to confirm position. An 8 mm trocar was placed in the left mid abdomen and an 8 mm trocar was placed in the left lateral abdomen. An 8 mm trocar was placed in the right mid abdomen. A 5 mm trocar was placed in the right upper abdomen. Bilateral TAP blocks were placed with Marcaine.  A Nathanson retractor was placed in the subxiphoid space and used to retract the left lobe of the liver.  The hiatal hernia appeared scarred and moderate sized and contained 1/3 of the stomach. The left sided scar was divided with vessel sealer. The peritoneum of the right crus was divided and the sac separated from the chest contents. This plane was continued anteriorly and to the left crus. The esophagus was going towards the left chest and care was taken to avoid injury. Next, the posterior area was dissected free. Additional care was used to dissect attachments to the chest to the sac and esophagus to improve mobility. A penrose was placed  around the GE junction for visualization and retraction. The esophagus was completely freed from surrounding attachments. Care was taken to avoid injury to the vagus nerves. Posteriorly there were multiple pledgets that kept a portion of the repair intact. Vessel sealer was used to free up the crus on each side to allow better mobilization.  The crus was repaired with 4 interrupted 2-0 ethibond sutures showing appropriate sizing of the crus.  A bioA mesh was placed against the repair posteriorly and sutured in place with 3 2-0 ethibonds  G tube: Next, a 2-0 silk was used to make a pursestring in the distal body of the anterior stomach. The robot was undocked. A left upper quadrant incision was made. An 18 fr g tube was introduced into the abdomen. A gastrotomy was made with cautery in the center of the pursestring area. The g tube was easily introduced. The pursestring was tied down. 2 0 silks were used for gastropexy superior and inferior to the g tube and sutured to the fascia with suture passer. The g tube was tightened to 6 cm at the skin.   Hemostasis was inspected and intact. Pneumoperitoneum was removed. All trocars were removed. All incisions were closed with 4-0 monocryl subcuticular suture. Dermabond was placed for dressing. The patient awoke from anesthesia and was brought to pacu in stable condition.   Findings: moderate recurrent hiatal hernia, esophagus scarred to the left side of the chest  Specimen: none  Implant: 18 Fr g tube, BioA mesh   Blood loss: 30 ml  Local anesthesia: 30 ml Marcaine  Complications: none  Feliciana Rossetti, M.D. General, Bariatric, &  Minimally Invasive Surgery [facility], [place]

## 2023-06-16 NOTE — Anesthesia Preprocedure Evaluation (Addendum)
 Anesthesia Evaluation  Patient identified by MRN, date of birth, ID band Patient awake    Reviewed: Allergy & Precautions, H&P , NPO status , Patient's Chart, lab work & pertinent test results  Airway Mallampati: II  TM Distance: >3 FB Neck ROM: Full    Dental no notable dental hx. (+) Teeth Intact, Dental Advisory Given, Caps   Pulmonary neg pulmonary ROS, asthma , pneumonia   Pulmonary exam normal breath sounds clear to auscultation       Cardiovascular Exercise Tolerance: Good negative cardio ROS Normal cardiovascular exam Rhythm:Regular Rate:Normal  CV: Echo 05/30/2013 - Left ventricle: The cavity size was normal. Systolic    function was normal. The estimated ejection fraction was    in the range of 55% to 60%. Wall motion was normal; there    were no regional wall motion abnormalities.  - Atrial septum: No defect or patent foramen ovale was    identified.     Neuro/Psych  Headaches PSYCHIATRIC DISORDERS  Depression    TIA Neuromuscular disease CVA negative neurological ROS  negative psych ROS   GI/Hepatic negative GI ROS, Neg liver ROS, hiatal hernia,,,  Endo/Other  negative endocrine ROS    Renal/GU negative Renal ROS  negative genitourinary   Musculoskeletal negative musculoskeletal ROS (+) Arthritis ,  Fibromyalgia -  Abdominal   Peds negative pediatric ROS (+)  Hematology negative hematology ROS (+) Blood dyscrasia, anemia   Anesthesia Other Findings   Reproductive/Obstetrics negative OB ROS                             Anesthesia Physical Anesthesia Plan  ASA: 3  Anesthesia Plan: General   Post-op Pain Management: Minimal or no pain anticipated, Ofirmev IV (intra-op)* and Toradol IV (intra-op)*   Induction: Intravenous and Cricoid pressure planned  PONV Risk Score and Plan: 3 and Ondansetron and Dexamethasone  Airway Management Planned: Oral ETT  Additional  Equipment: None  Intra-op Plan:   Post-operative Plan: Extubation in OR  Informed Consent: I have reviewed the patients History and Physical, chart, labs and discussed the procedure including the risks, benefits and alternatives for the proposed anesthesia with the patient or authorized representative who has indicated his/her understanding and acceptance.       Plan Discussed with: Anesthesiologist and CRNA  Anesthesia Plan Comments: ( DISCUSSION:83 y.o. never smoker with h/o asthma, TIAs, hiatal hernia scheduled for above procedure 06/16/2023 with Dr. Feliciana Rossetti.    Pt seen by cardiology 04/14/2023 for preoperative evaluation.  Per OV note, "Pre op cardiac evaluation for ventral hernia repair. No active cardiac conditions. Able to function at > 5 mets level. Surgical risk is low based mainly on age. No further cardiac testing required. No concerns from a cardiac standpoint. Can hold Plavix for 5 days prior to surgery. Follow up PRN " )       Anesthesia Quick Evaluation

## 2023-06-16 NOTE — Transfer of Care (Signed)
 Immediate Anesthesia Transfer of Care Note  Patient: [name]  Procedure(s) Performed: REPAIR, HERNIA, HIATAL, ROBOT-ASSISTED INSERTION, GASTROSTOMY TUBE, PERCUTANEOUS  Patient Location: PACU  Anesthesia Type:General  Level of Consciousness: awake, alert , and oriented  Airway & Oxygen Therapy: Patient Spontanous Breathing and Patient connected to face mask oxygen  Post-op Assessment: Report given to RN and Post -op Vital signs reviewed and stable  Post vital signs: Reviewed and stable  Last Vitals:  Vitals Value Taken Time  BP 136/77 06/16/23 1552  Temp 36.4 C 06/16/23 1552  Pulse 69 06/16/23 1557  Resp 16 06/16/23 1557  SpO2 100 % 06/16/23 1557  Vitals shown include unfiled device data.  Last Pain:  Vitals:   06/16/23 1224  TempSrc:   PainSc: 2       Patients Stated Pain Goal: 5 (06/16/23 1209)  Complications: No notable events documented.

## 2023-06-16 NOTE — Anesthesia Procedure Notes (Signed)
 Procedure Name: Intubation Date/Time: 06/16/2023 1:00 PM  Performed by: Floydene Flock, CRNAPre-anesthesia Checklist: Patient identified, Emergency Drugs available, Suction available and Patient being monitored Patient Re-evaluated:Patient Re-evaluated prior to induction Oxygen Delivery Method: Circle system utilized Preoxygenation: Pre-oxygenation with 100% oxygen Induction Type: IV induction Ventilation: Mask ventilation without difficulty Laryngoscope Size: 3 and Mac Grade View: Grade I Tube type: Oral Tube size: 7.5 mm Number of attempts: 1 Airway Equipment and Method: Stylet Placement Confirmation: ETT inserted through vocal cords under direct vision, positive ETCO2 and breath sounds checked- equal and bilateral Secured at: 22 cm Tube secured with: Tape Dental Injury: Teeth and Oropharynx as per pre-operative assessment

## 2023-06-16 NOTE — H&P (Signed)
 Subjective   [name] is a 83 y.o. female established patient in today for: History of Present Illness The patient, an 83 year old with a history of heart disease and stroke, presents for a discussion about potential surgery. The patient has been experiencing heartburn and swallowing difficulties, which are managed by eating small meals. The patient reports that these symptoms are tolerable as long as she adheres to a diet of small meals and avoids certain foods, particularly fried foods. The patient expresses that she feels limited in her food choices and is considering surgery to potentially alleviate these symptoms and expand her dietary options. The patient also mentions occasional chest pain, which seems to be related to her diet. The patient is otherwise doing well and is adhering to her dietary restrictions.  There is no problem list on file for this patient.  Outpatient Medications Prior to Visit  Medication Sig Dispense Refill  cholecalciferol (VITAMIN D3) 2,000 unit tablet Take 2,000 Units by mouth once daily  clopidogreL (PLAVIX) 75 mg tablet Take 1 tablet by mouth once daily  fexofenadine (ALLEGRA) 60 MG tablet Take 60 mg by mouth 2 (two) times daily  magnesium 250 mg Tab Take 1 tablet by mouth with evening meal.  multivit with minerals/lutein (MULTIVITAMIN 50 PLUS ORAL) Multivitamin  ondansetron (ZOFRAN) 4 MG tablet Take by mouth  topiramate (TOPAMAX) 25 MG tablet TAKE ONE TABLET IN THE MORNING AND 3 IN THE EVENING   No facility-administered medications prior to visit.   Review of Systems  Constitutional: Negative.  HENT: Negative.  Eyes: Negative.  Respiratory: Negative.  Cardiovascular: Negative.  Gastrointestinal: Negative.  Genitourinary: Negative.  Musculoskeletal: Negative.  Skin: Negative.  Neurological: Negative.  Endo/Heme/Allergies: Negative.  Psychiatric/Behavioral: Negative.     Objective   Vitals:  05/20/23 0955  PainSc: 0-No pain   There is no  height or weight on file to calculate BMI. Physical Exam Constitutional:  Appearance: Normal appearance.  HENT:  Head: Normocephalic and atraumatic.  Pulmonary:  Effort: Pulmonary effort is normal.  Musculoskeletal:  General: Normal range of motion.  Cervical back: Normal range of motion.  Neurological:  General: No focal deficit present.  Mental Status: She is alert and oriented to person, place, and time. Mental status is at baseline.  Psychiatric:  Mood and Affect: Mood normal.  Behavior: Behavior normal.  Thought Content: Thought content normal.     Assessment/Plan:   Assessment & Plan Hiatal hernia Hiatal hernia causing heartburn and dysphagia. Discussed surgical intervention benefits and risks, including dietary improvements and medication discontinuation. Surgery to be performed robotically with potential use of absorbable mesh. Postoperative care includes liquid diet transitioning to solids. Recovery may be slower due to age, requiring maintenance of muscle mass and activity. - Schedule robotic hiatal hernia repair surgery in April. The anatomy & physiology of the foregut and anti-reflux mechanism was discussed. The pathophysiology of hiatal herniation and GERD was discussed. Natural history risks without surgery was discussed. The patient's symptoms are not adequately controlled by medicines and other non-operative treatments. I feel the risks of no intervention will lead to serious problems that outweigh the operative risks; therefore, I recommended surgery to reduce the hiatal hernia out of the chest and fundoplication to rebuild the anti-reflux valve and control reflux better. Need for a thorough workup to rule out the differential diagnosis and plan treatment was explained. I explained minimally invasive techniques with possible need for an open approach.  Risks such as bleeding, infection, abscess, leak,injury to other organs, need for repair  of tissues / organs, need for  further treatment, stroke, heart attack, death, and other risks were discussed. I noted a good likelihood this will help address the problem. Goals of post-operative recovery were discussed as well. Possibility that this will not correct all symptoms was explained. Post-operative dysphagia, need for short-term liquid & pureed diet, inability to vomit, possibility of reherniation, possible need for medicines to help control symptoms in addition to surgery were discussed. We will work to minimize complications. Educational handouts further explaining the pathology, treatment options, and dysphagia diet was given as well. Questions were answered. The patient expresses understanding & wishes to proceed with surgery.  - Discontinue Clopidogrel one week prior to surgery. - Plan for overnight hospital stay post-surgery. - Implement a liquid diet for two weeks post-surgery, with gradual reintroduction of solid foods over six to eight weeks. - Coordinate with another surgeon for the procedure at [facility].  Transient ischemic attack and stroke History of transient ischemic attack and stroke with no residual deficits. Cardiology assessment satisfactory.  Follow-up Surgery scheduled for April, exact date pending. - Confirm the surgery date once scheduled. Diagnoses and all orders for this visit:

## 2023-06-17 ENCOUNTER — Encounter (HOSPITAL_COMMUNITY): Payer: Self-pay | Admitting: General Surgery

## 2023-06-17 DIAGNOSIS — Z7901 Long term (current) use of anticoagulants: Secondary | ICD-10-CM | POA: Diagnosis not present

## 2023-06-17 DIAGNOSIS — K449 Diaphragmatic hernia without obstruction or gangrene: Secondary | ICD-10-CM | POA: Diagnosis not present

## 2023-06-17 DIAGNOSIS — Z8673 Personal history of transient ischemic attack (TIA), and cerebral infarction without residual deficits: Secondary | ICD-10-CM | POA: Diagnosis not present

## 2023-06-17 LAB — BASIC METABOLIC PANEL WITH GFR
Anion gap: 8 (ref 5–15)
BUN: 12 mg/dL (ref 8–23)
CO2: 21 mmol/L — ABNORMAL LOW (ref 22–32)
Calcium: 8.5 mg/dL — ABNORMAL LOW (ref 8.9–10.3)
Chloride: 103 mmol/L (ref 98–111)
Creatinine, Ser: 0.81 mg/dL (ref 0.44–1.00)
GFR, Estimated: >60 mL/min (ref >60)
Glucose, Bld: 128 mg/dL — ABNORMAL HIGH (ref 70–99)
Potassium: 4 mmol/L (ref 3.5–5.1)
Sodium: 132 mmol/L — ABNORMAL LOW (ref 135–145)

## 2023-06-17 LAB — CBC
HCT: 37.6 % (ref 36.0–46.0)
Hemoglobin: 12.5 g/dL (ref 12.0–15.0)
MCH: 32.1 pg (ref 26.0–34.0)
MCHC: 33.2 g/dL (ref 30.0–36.0)
MCV: 96.4 fL (ref 80.0–100.0)
Platelets: 170 10*3/uL (ref 150–400)
RBC: 3.9 MIL/uL (ref 3.87–5.11)
RDW: 13.5 % (ref 11.5–15.5)
WBC: 8.3 10*3/uL (ref 4.0–10.5)
nRBC: 0 % (ref 0.0–0.2)

## 2023-06-17 MED ORDER — OXYCODONE HCL 5 MG PO TABS
2.5000 mg | ORAL_TABLET | ORAL | 0 refills | Status: AC | PRN
Start: 2023-06-17 — End: ?

## 2023-06-17 NOTE — Discharge Summary (Signed)
 Physician Discharge Summary  [name] HQI:[id number] DOB: 02-07-1941 DOA: 06/16/2023  PCP: Arnette Felts, FNP  Admit date: 06/16/2023 Discharge date:  06/17/2023   Recommendations for Outpatient Follow-up:   (include homehealth, outpatient follow-up instructions, specific recommendations for PCP to follow-up on, etc.)   Discharge Diagnoses:  Principal Problem:   Hiatal hernia   Surgical Procedure: robotic hiatal hernia and g tube insertion  Discharge Condition: Good Disposition: Home  Diet recommendation: liquid diet for 2 weeks then soft foods   Hospital Course:  She presented for recurrent hiatal hernia repair. Postop she was observed on the med surg floor. She did well tolerating fluid and pain was controlled. She was discharged home POD 1.  Discharge Instructions  Discharge Instructions     Diet full liquid   Complete by: As directed    Discharge wound care:   Complete by: As directed    Shower normal tomorrow. Glue to stay on for 10-14 days. Bandage around g tube. Keep g tube clamped, ok to unclamp over sink if severely bloated   Increase activity slowly   Complete by: As directed       Allergies as of 06/17/2023       Reactions   Aspirin Anaphylaxis, Hives   Elavil [amitriptyline] Anaphylaxis, Rash   Relafen [nabumetone] Hives   Latex Rash        Medication List     TAKE these medications    acetaminophen 500 MG tablet Commonly known as: TYLENOL Take 1,000 mg by mouth every 6 (six) hours as needed for moderate pain (pain score 4-6) or mild pain (pain score 1-3).   CALCIUM 1200 PO Take 1,200 mg by mouth daily.   cetirizine 10 MG tablet Commonly known as: ZYRTEC Take 10 mg by mouth daily.   clopidogrel 75 MG tablet Commonly known as: PLAVIX TAKE 1 TABLET BY MOUTH EVERY DAY   fluticasone 50 MCG/ACT nasal spray Commonly known as: FLONASE Place 2 sprays into both nostrils daily as needed for allergies or rhinitis.   ketorolac 0.5 %  ophthalmic solution Commonly known as: ACULAR Place 1 drop into the right eye 4 (four) times daily as needed (If need for infection).   MULTIVITAMIN WOMEN 50+ PO Take 1 tablet by mouth daily. Chewable   ondansetron 4 MG tablet Commonly known as: Zofran Take 1 tablet (4 mg total) by mouth daily as needed for nausea or vomiting.   OVER THE COUNTER MEDICATION Place 1 spray into both nostrils daily as needed (Allergies). astra pro nasal spray   oxyCODONE 5 MG immediate release tablet Commonly known as: Oxy IR/ROXICODONE Take 0.5-1 tablets (2.5-5 mg total) by mouth every 4 (four) hours as needed for moderate pain (pain score 4-6).   SYSTANE OP Place 1 drop into both eyes daily as needed (eye). As needed   topiramate 25 MG tablet Commonly known as: TOPAMAX TAKE ONE TABLET IN THE MORNING AND 3 IN THE EVENING   trimethoprim-polymyxin b ophthalmic solution Commonly known as: POLYTRIM Place 1 drop into the right eye See admin instructions. Take 4 time a day after injection   Vitamin D 50 MCG (2000 UT) tablet Take 2,000 Units by mouth daily.               Discharge Care Instructions  (From admission, onward)           Start     Ordered   06/17/23 0000  Discharge wound care:       Comments: Shower normal  tomorrow. Glue to stay on for 10-14 days. Bandage around g tube. Keep g tube clamped, ok to unclamp over sink if severely bloated   06/17/23 0836              The results of significant diagnostics from this hospitalization (including imaging, microbiology, ancillary and laboratory) are listed below for reference.    Significant Diagnostic Studies: No results found.  Labs: Basic Metabolic Panel: Recent Labs  Lab 06/10/23 1431 06/16/23 1806 06/17/23 0432  NA 139  --  132*  K 4.3  --  4.0  CL 108  --  103  CO2 24  --  21*  GLUCOSE 88  --  128*  BUN 18  --  12  CREATININE 1.09* 0.81 0.81  CALCIUM 9.9  --  8.5*   Liver Function Tests: No results for  input(s): "AST", "ALT", "ALKPHOS", "BILITOT", "PROT", "ALBUMIN" in the last 168 hours.  CBC: Recent Labs  Lab 06/10/23 1431 06/16/23 1806 06/17/23 0432  WBC 5.5 11.2* 8.3  HGB 13.6 13.3 12.5  HCT 42.6 41.0 37.6  MCV 96.2 96.2 96.4  PLT 230 177 170    CBG: No results for input(s): "GLUCAP" in the last 168 hours.  Principal Problem:   Hiatal hernia   Time coordinating discharge: 15 min

## 2023-06-17 NOTE — Plan of Care (Signed)
  Problem: Education: Goal: Knowledge of General Education information will improve Description: Including pain rating scale, medication(s)/side effects and non-pharmacologic comfort measures Outcome: Adequate for Discharge   

## 2023-06-17 NOTE — Anesthesia Postprocedure Evaluation (Signed)
 Anesthesia Post Note  Patient: [name]  Procedure(s) Performed: REPAIR, HERNIA, HIATAL, ROBOT-ASSISTED INSERTION, GASTROSTOMY TUBE, PERCUTANEOUS     Patient location during evaluation: PACU Anesthesia Type: General Level of consciousness: awake and alert Pain management: pain level controlled Vital Signs Assessment: post-procedure vital signs reviewed and stable Respiratory status: spontaneous breathing, nonlabored ventilation, respiratory function stable and patient connected to nasal cannula oxygen Cardiovascular status: blood pressure returned to baseline and stable Postop Assessment: no apparent nausea or vomiting Anesthetic complications: no   No notable events documented.  Last Vitals:  Vitals:   06/17/23 0154 06/17/23 0513  BP: 127/60 134/69  Pulse: 66 61  Resp: 17 18  Temp: 36.7 C 36.6 C  SpO2: 96% 99%    Last Pain:  Vitals:   06/17/23 0617  TempSrc:   PainSc: 0-No pain                 [name]

## 2023-06-30 ENCOUNTER — Encounter (INDEPENDENT_AMBULATORY_CARE_PROVIDER_SITE_OTHER): Admitting: Ophthalmology

## 2023-07-08 ENCOUNTER — Other Ambulatory Visit: Payer: Self-pay | Admitting: Nurse Practitioner

## 2023-07-08 DIAGNOSIS — Z8673 Personal history of transient ischemic attack (TIA), and cerebral infarction without residual deficits: Secondary | ICD-10-CM

## 2023-10-28 ENCOUNTER — Ambulatory Visit: Payer: Medicare Other

## 2023-10-28 VITALS — BP 110/62 | HR 62 | Temp 98.1°F | Ht 64.0 in | Wt 159.4 lb

## 2023-10-28 DIAGNOSIS — Z0000 Encounter for general adult medical examination without abnormal findings: Secondary | ICD-10-CM | POA: Diagnosis not present

## 2023-10-28 NOTE — Progress Notes (Signed)
 Subjective:   [name] is a 83 y.o. who presents for a Medicare Wellness preventive visit.  As a reminder, Annual Wellness Visits don't include a physical exam, and some assessments may be limited, especially if this visit is performed virtually. We may recommend an in-person follow-up visit with your provider if needed.  Visit Complete: In person    Persons Participating in Visit: Patient.  AWV Questionnaire: No: Patient Medicare AWV questionnaire was not completed prior to this visit.  Cardiac Risk Factors include: advanced age (>51men, >20 women)     Objective:    Today's Vitals   10/28/23 1409  BP: 110/62  Pulse: 62  Temp: 98.1 F (36.7 C)  TempSrc: Oral  SpO2: 98%  Weight: 159 lb 6.4 oz (72.3 kg)  Height: 5' 4 (1.626 m)  PainSc: 4    Body mass index is 27.36 kg/m.     10/28/2023    2:22 PM 06/16/2023   12:12 PM 06/10/2023    2:12 PM 10/23/2022    3:00 PM 10/10/2021    3:58 PM 09/27/2020   10:38 AM 06/18/2020    2:47 PM  Advanced Directives  Does Patient Have a Medical Advance Directive? Yes Yes No No Yes Yes Yes  Type of Estate agent of [place];Living will Living will   Healthcare Power of [place];Living will Healthcare Power of [place];Living will Healthcare Power of Attorney  Does patient want to make changes to medical advance directive?  No - Patient declined     No - Patient declined  Copy of Healthcare Power of Attorney in Chart? No - copy requested    No - copy requested No - copy requested   Would patient like information on creating a medical advance directive?  No - Patient declined No - Patient declined Yes (MAU/Ambulatory/Procedural Areas - Information given)       Current Medications (verified) Outpatient Encounter Medications as of 10/28/2023  Medication Sig   acetaminophen  (TYLENOL ) 500 MG tablet Take 1,000 mg by mouth every 6 (six) hours as needed for moderate pain (pain score 4-6) or mild pain (pain score 1-3).    Calcium  Carbonate-Vit D-Min (CALCIUM  1200 PO) Take 1,200 mg by mouth daily.   cetirizine (ZYRTEC) 10 MG tablet Take 10 mg by mouth daily.   Cholecalciferol (VITAMIN D ) 2000 units tablet Take 2,000 Units by mouth daily.   clopidogrel  (PLAVIX ) 75 MG tablet TAKE 1 TABLET BY MOUTH EVERY DAY   fluticasone  (FLONASE ) 50 MCG/ACT nasal spray Place 2 sprays into both nostrils daily as needed for allergies or rhinitis.   ketorolac  (ACULAR ) 0.5 % ophthalmic solution Place 1 drop into the right eye 4 (four) times daily as needed (If need for infection).   Multiple Vitamins-Minerals (MULTIVITAMIN WOMEN 50+ PO) Take 1 tablet by mouth daily. Chewable   OVER THE COUNTER MEDICATION Place 1 spray into both nostrils daily as needed (Allergies). astra pro nasal spray   Polyethyl Glycol-Propyl Glycol (SYSTANE OP) Place 1 drop into both eyes daily as needed (eye). As needed   trimethoprim-polymyxin b (POLYTRIM) ophthalmic solution Place 1 drop into the right eye See admin instructions. Take 4 time a day after injection   oxyCODONE  (OXY IR/ROXICODONE ) 5 MG immediate release tablet Take 0.5-1 tablets (2.5-5 mg total) by mouth every 4 (four) hours as needed for moderate pain (pain score 4-6). (Patient not taking: Reported on 10/28/2023)   topiramate  (TOPAMAX ) 25 MG tablet TAKE ONE TABLET IN THE MORNING AND 3 IN THE EVENING (Patient  not taking: Reported on 10/28/2023)   No facility-administered encounter medications on file as of 10/28/2023.    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   Past Surgical History:  Procedure Laterality Date   ABDOMINAL HYSTERECTOMY     DILATION AND CURETTAGE OF UTERUS     HIATAL HERNIA REPAIR     done at [facility]   LAPAROSCOPIC INSERTION GASTROSTOMY TUBE N/A 06/16/2023   Procedure: INSERTION, GASTROSTOMY TUBE, PERCUTANEOUS;  Surgeon: Kinsinger, Herlene Righter, MD;  Location: [facility];  Service: General;  Laterality: N/A;  LAPAROSCOPIC G TUBE INSERTION CREATION   TONSILLECTOMY     XI ROBOTIC ASSISTED HIATAL HERNIA REPAIR N/A 06/16/2023   Procedure: REPAIR, HERNIA, HIATAL, ROBOT-ASSISTED;  Surgeon: Kinsinger, Herlene Righter, MD;  Location: [facility];  Service: General;  Laterality: N/A;  ROBOTIC HIATAL HERNIA REPAIR WITH FUNDOPLICATION   Family History  Problem Relation Age of Onset   Heart disease Mother    CVA Mother    Stroke Father    Emphysema Father    Social History   Socioeconomic History   Marital status: Widowed    Spouse name: Not on file   Number of children: 5   Years of education: college   Highest education level: Not on file  Occupational History   Occupation: Retired  Tobacco Use   Smoking status: Never    Passive exposure: Never   Smokeless tobacco: Never  Vaping Use   Vaping status: Never Used  Substance and Sexual Activity   Alcohol  use: No    Comment: very occasional   Drug use: No   Sexual activity: Not Currently  Other Topics Concern   Not on file  Social History Narrative   Patient is married with 5 children.   Patient is right handed.   Patient has college education.   Patient drinks 3 cups daily.         Social Drivers of Corporate investment banker Strain: Low Risk  (10/28/2023)   Overall Financial Resource Strain (CARDIA)    Difficulty of Paying Living Expenses: Not hard at all  Food Insecurity: No Food Insecurity (10/28/2023)   Hunger Vital Sign     Worried About Running Out of Food in the Last Year: Never true    Ran Out of Food in the Last Year: Never true  Transportation Needs: No Transportation Needs (10/28/2023)   PRAPARE - Administrator, Civil Service (Medical): No    Lack of Transportation (Non-Medical): No  Physical Activity: Inactive (10/28/2023)   Exercise Vital Sign    Days of Exercise per Week: 0 days    Minutes of Exercise per Session: 0 min  Stress: No Stress Concern Present (10/28/2023)   [employer] of Occupational Health - Occupational Stress Questionnaire    Feeling of Stress: Not at all  Social Connections:  Socially Isolated (10/28/2023)   Social Connection and Isolation Panel    Frequency of Communication with Friends and Family: Three times a week    Frequency of Social Gatherings with Friends and Family: Once a week    Attends Religious Services: Never    Database administrator or Organizations: No    Attends Banker Meetings: Never    Marital Status: Widowed    Tobacco Counseling Counseling given: Not Answered    Clinical Intake:  Pre-visit preparation completed: Yes  Pain : 0-10 Pain Score: 4  Pain Type: Chronic pain Pain Location: Knee Pain Orientation: Left, Right Pain Descriptors / Indicators: Aching Pain Onset: More than a month ago Pain Frequency: Constant     Nutritional Risks: None Diabetes: No  Lab Results  Component Value Date   HGBA1C 5.6 01/12/2023   HGBA1C 5.7 (H) 05/08/2022   HGBA1C 5.5 10/27/2019     How often do you need to have someone help you when you read instructions, pamphlets, or other written materials from your doctor or pharmacy?: 1 - Never  Interpreter Needed?: No  Information entered by :: NAllen LPN   Activities of Daily Living     10/28/2023    2:11 PM 06/10/2023    2:15 PM  In your present state of health, do you have any difficulty performing the following activities:  Hearing? 1   Comment sometimes, but does not  wear hearing aid   Vision? 0   Comment with reading   Difficulty concentrating or making decisions? 0   Walking or climbing stairs? 1   Comment due to knees   Dressing or bathing? 0   Doing errands, shopping? 1 0  Preparing Food and eating ? N   Using the Toilet? N   In the past six months, have you accidently leaked urine? Y   Comment wears pads   Do you have problems with loss of bowel control? N   Comment once in a while   Managing your Medications? N   Managing your Finances? N   Housekeeping or managing your Housekeeping? N     Patient Care Team: Georgina Speaks, FNP as PCP - General (General Practice)  I have updated your Care Teams any recent Medical Services you may have received from other providers in the past year.     Assessment:   This is a routine wellness examination for [place].  Hearing/Vision screen Hearing Screening - Comments:: Has hearing aid but does not wear Vision Screening - Comments:: Regular eye exams, [facility]   Goals Addressed             This Visit's Progress    Patient Stated       10/28/2023, wants to weigh 135-140 pounds       Depression Screen     10/28/2023    2:25 PM 10/23/2022    3:03 PM 07/02/2022   11:43 AM 05/08/2022   11:30 AM 11/06/2021   11:21 AM 11/06/2021   11:20 AM 10/10/2021    4:00 PM  PHQ 2/9 Scores  PHQ - 2 Score 0 0 0 1 0 0 0  PHQ- 9 Score 4 0  8 0      Fall Risk     10/28/2023    2:24 PM 10/23/2022    3:02 PM 09/22/2022    8:53 AM 07/02/2022   11:43 AM 05/08/2022   11:30 AM  Fall Risk   Falls in the past year? 0  0 0 0 0  Number falls in past yr: 0 0 0    Injury with Fall? 0 0 0    Risk for fall due to : Impaired mobility;Impaired balance/gait Medication side effect No Fall Risks Impaired balance/gait;Impaired mobility   Follow up Falls evaluation completed;Falls prevention discussed Falls prevention discussed;Falls evaluation completed Falls evaluation completed;Education provided      MEDICARE RISK AT  HOME:  Medicare Risk at Home Any stairs in or around the home?: Yes If so, are there any without handrails?: No Home free of loose throw rugs in walkways, pet beds, electrical cords, etc?: Yes Adequate lighting in your home to reduce risk of falls?: Yes Life alert?: No Use of a cane, walker or w/c?: Yes Grab bars in the bathroom?: Yes Shower chair or bench in shower?: No Elevated toilet seat or a handicapped toilet?: Yes  TIMED UP AND GO:  Was the test performed?  Yes  Length of time to ambulate 10 feet: 6 sec Gait slow and steady with assistive device  Cognitive Function: 6CIT completed    05/01/2020   11:05 AM  MMSE - Mini Mental State Exam  Orientation to time 5  Orientation to Place 5  Registration 3  Attention/ Calculation 5  Recall 3  Language- name 2 objects 2  Language- repeat 1  Language- follow 3 step command 3  Language- read & follow direction 1  Write a sentence 1  Copy design 1  Total score 30        10/28/2023    2:29 PM 10/23/2022    3:05 PM 10/10/2021    4:06 PM 09/27/2020   10:44 AM 05/01/2020   10:55 AM  6CIT Screen  What Year? 0 points 0 points 0 points 0 points 0 points  What month? 0 points 0 points 0 points 0 points 0 points  What time? 0 points 0 points 0 points 0 points 3 points  Count back from 20 0 points 0 points 0 points 0 points 0 points  Months in reverse 2 points 2 points 0 points 0 points 0 points  Repeat phrase 0 points 2 points 4 points 4 points 0 points  Total Score 2 points 4 points 4 points 4 points 3 points    Immunizations Immunization History  Administered Date(s) Administered   Fluad Quad(high Dose 65+) 11/18/2021   Fluad Trivalent(High Dose 65+) 01/12/2023   Influenza, High Dose Seasonal PF 11/02/2016, 01/06/2018, 02/24/2019   Influenza-Unspecified 01/06/2018   Moderna SARS-COV2 Booster Vaccination 09/04/2020   Moderna Sars-Covid-2 Vaccination 06/27/2019, 07/25/2019   PNEUMOCOCCAL CONJUGATE-20 10/10/2021    Pneumococcal Conjugate-13 08/27/2018   Pneumococcal Polysaccharide-23 10/21/2018, 10/28/2019   Tdap 03/22/2013    Screening Tests Health Maintenance  Topic Date Due   DTaP/Tdap/Td (2 - Td or Tdap) 03/23/2023   INFLUENZA VACCINE  10/09/2023   Zoster Vaccines- Shingrix (1 of 2) 01/28/2024 (Originally [birth year]1991)   MAMMOGRAM  12/09/2023   Medicare Annual Wellness (AWV)  10/27/2024   Pneumococcal Vaccine: 50+ Years  Completed   DEXA SCAN  Completed   HPV VACCINES  Aged Out   Meningococcal B Vaccine  Aged Out   COVID-19 Vaccine  Discontinued   Hepatitis C Screening  Discontinued    Health Maintenance  Health Maintenance Due  Topic Date Due   DTaP/Tdap/Td (2 - Td or Tdap) 03/23/2023   INFLUENZA VACCINE  10/09/2023   Health Maintenance Items Addressed: Due for TDAP and flu vaccine.  Additional Screening:  Vision Screening: Recommended annual  ophthalmology exams for early detection of glaucoma and other disorders of the eye. Would you like a referral to an eye doctor? No    Dental Screening: Recommended annual dental exams for proper oral hygiene  Community Resource Referral / Chronic Care Management: CRR required this visit?  No   CCM required this visit?  No   Plan:    I have personally reviewed and noted the following in the patient's chart:   Medical and social history Use of alcohol , tobacco or illicit drugs  Current medications and supplements including opioid prescriptions. Patient is not currently taking opioid prescriptions. Functional ability and status Nutritional status Physical activity Advanced directives List of other physicians Hospitalizations, surgeries, and ER visits in previous 12 months Vitals Screenings to include cognitive, depression, and falls Referrals and appointments  In addition, I have reviewed and discussed with patient certain preventive protocols, quality metrics, and best practice recommendations. A written personalized care plan  for preventive services as well as general preventive health recommendations were provided to patient.   Ardella FORBES Dawn, LPN   [id number]/7974   After Visit Summary: (In Person-Printed) AVS printed and given to the patient  Notes: Nothing significant to report at this time.

## 2023-10-28 NOTE — Patient Instructions (Signed)
 [name] , Thank you for taking time out of your busy schedule to complete your Annual Wellness Visit with me. I enjoyed our conversation and look forward to speaking with you again next year. I, as well as your care team,  appreciate your ongoing commitment to your health goals. Please review the following plan we discussed and let me know if I can assist you in the future. Your Game plan/ To Do List    Referrals: If you haven't heard from the office you've been referred to, please reach out to them at the phone provided.   Follow up Visits: We will see or speak with you next year for your Next Medicare AWV with our clinical staff Have you seen your provider in the last 6 months (3 months if uncontrolled diabetes)? No  Clinician Recommendations:  Aim for 30 minutes of exercise or brisk walking, 6-8 glasses of water, and 5 servings of fruits and vegetables each day.       This is a list of the screenings recommended for you:  Health Maintenance  Topic Date Due   DTaP/Tdap/Td vaccine (2 - Td or Tdap) 03/23/2023   Flu Shot  10/09/2023   Zoster (Shingles) Vaccine (1 of 2) 01/28/2024*   Mammogram  12/09/2023   Medicare Annual Wellness Visit  10/27/2024   Pneumococcal Vaccine for age over 38  Completed   DEXA scan (bone density measurement)  Completed   HPV Vaccine  Aged Out   Meningitis B Vaccine  Aged Out   COVID-19 Vaccine  Discontinued   Hepatitis C Screening  Discontinued  *Topic was postponed. The date shown is not the original due date.    Advanced directives: (Copy Requested) Please bring a copy of your health care power of attorney and living will to the office to be added to your chart at your convenience. You can mail to [facility] [address] [place], [place] [address] or email to [email or web address] Advance Care Planning is important because it:  [x]  Makes sure you receive the medical care that is consistent with your values, goals, and  preferences  [x]  It provides guidance to your family and loved ones and reduces their decisional burden about whether or not they are making the right decisions based on your wishes.  Follow the link provided in your after visit summary or read over the paperwork we have mailed to you to help you started getting your Advance Directives in place. If you need assistance in completing these, please reach out to us  so that we can help you!  See attachments for Preventive Care and Fall Prevention Tips.

## 2023-10-29 ENCOUNTER — Other Ambulatory Visit: Payer: Self-pay

## 2023-10-29 MED ORDER — TOPIRAMATE 25 MG PO TABS: ORAL_TABLET | ORAL | 3 refills | Status: AC

## 2023-11-26 ENCOUNTER — Encounter: Payer: Self-pay | Admitting: Nurse Practitioner

## 2023-11-26 ENCOUNTER — Ambulatory Visit: Admitting: Nurse Practitioner

## 2023-11-26 VITALS — BP 102/68 | HR 68 | Temp 98.2°F | Ht 64.0 in | Wt 155.8 lb

## 2023-11-26 DIAGNOSIS — H353211 Exudative age-related macular degeneration, right eye, with active choroidal neovascularization: Secondary | ICD-10-CM

## 2023-11-26 DIAGNOSIS — Z23 Encounter for immunization: Secondary | ICD-10-CM

## 2023-11-26 DIAGNOSIS — Z1231 Encounter for screening mammogram for malignant neoplasm of breast: Secondary | ICD-10-CM | POA: Insufficient documentation

## 2023-11-26 DIAGNOSIS — Z6826 Body mass index (BMI) 26.0-26.9, adult: Secondary | ICD-10-CM

## 2023-11-26 DIAGNOSIS — R7309 Other abnormal glucose: Secondary | ICD-10-CM | POA: Diagnosis not present

## 2023-11-26 DIAGNOSIS — G4700 Insomnia, unspecified: Secondary | ICD-10-CM

## 2023-11-26 DIAGNOSIS — E7800 Pure hypercholesterolemia, unspecified: Secondary | ICD-10-CM

## 2023-11-26 MED ORDER — TRAZODONE HCL 50 MG PO TABS: ORAL_TABLET | ORAL | 2 refills | Status: DC

## 2023-11-26 NOTE — Progress Notes (Signed)
 LILLETTE Kristeen JINNY Gladis, CMA,acting as a Neurosurgeon for Mary Ada, FNP.,have documented all relevant documentation on the behalf of Mary Ada, FNP,as directed by  Mary Ada, FNP while in the presence of Mary Ada, FNP.  Subjective:  Patient ID: [name] , female    DOB: Jan 26, 1941 , 83 y.o.   MRN: [id number]  Chief Complaint  Patient presents with   Insomnia    Patient presents today for a insomnia follow up, Patient reports compliance with medication. Patient denies any chest pain, SOB, or headaches. Patient reports she has trouble staying asleep and she tosses and turns. Patient reports she wakes 3/4 times a night and goes to the bathroom.      HPI  Discussed the use of AI scribe software for clinical note transcription with the patient, who gave verbal consent to proceed.  History of Present Illness [name] is an 83 year old female who presents with sleep disturbances and frequent nocturnal urination.  She has been experiencing sleep disturbances for over a year, characterized by difficulty staying asleep and frequent awakenings during the night to urinate. Despite trying melatonin, she continues to wake up frequently and sometimes struggles to return to sleep. Her sleep schedule is irregular, with bedtime varying between 11 PM and 2 AM, and she occasionally stays awake until 4 AM. She does not take naps during the day, but when she sleeps late, she may sleep until 1 PM.  She experiences frequent nocturnal urination, typically waking up three times per night to use the bathroom. She drinks a lot of water but tries to stop by 8 PM. Despite reducing her fluid intake, she continues to experience frequent urination at night. She notes that her urination frequency decreased slightly while on a recent trip to the beach.  She mentions a history of hernia surgery a few months ago and is concerned that her hernia may be recurring as her 'stomach gets big occasionally  She has a  history of allergies, which she describes as similar to 'everybody Fernandez.'   Past Medical History:  Diagnosis Date   Abnormality of gait 04/10/2015   Anemia    Asthma    as a child   Cervical spondylosis without myelopathy 07/18/2013   Common migraine with intractable migraine 04/10/2015   DJD (degenerative joint disease)    Fibromyalgia    Left sided numbness 05/29/2013   Macular degeneration    right eye,  has injections   Migraine headache    Neuromuscular disorder (HCC)    Pneumonia    Stroke (HCC)    TIAs   TIA (transient ischemic attack) 05/29/2013   Vitreomacular adhesion of right eye 03/08/2020     Family History  Problem Relation Age of Onset   Heart disease Mother    CVA Mother    Stroke Father    Emphysema Father      Current Outpatient Medications:    acetaminophen  (TYLENOL ) 500 MG tablet, Take 1,000 mg by mouth every 6 (six) hours as needed for moderate pain (pain score 4-6) or mild pain (pain score 1-3)., Disp: , Rfl:    Calcium  Carbonate-Vit D-Min (CALCIUM  1200 PO), Take 1,200 mg by mouth daily., Disp: , Rfl:    cetirizine (ZYRTEC) 10 MG tablet, Take 10 mg by mouth daily., Disp: , Rfl:    Cholecalciferol (VITAMIN D ) 2000 units tablet, Take 2,000 Units by mouth daily., Disp: , Rfl:    clopidogrel  (PLAVIX ) 75 MG tablet, TAKE 1 TABLET BY MOUTH EVERY  DAY, Disp: 90 tablet, Rfl: 1   fluticasone  (FLONASE ) 50 MCG/ACT nasal spray, Place 2 sprays into both nostrils daily as needed for allergies or rhinitis., Disp: , Rfl:    ketorolac  (ACULAR ) 0.5 % ophthalmic solution, Place 1 drop into the right eye 4 (four) times daily as needed (If need for infection)., Disp: , Rfl:    Multiple Vitamins-Minerals (MULTIVITAMIN WOMEN 50+ PO), Take 1 tablet by mouth daily. Chewable, Disp: , Rfl:    OVER THE COUNTER MEDICATION, Place 1 spray into both nostrils daily as needed (Allergies). astra pro nasal spray, Disp: , Rfl:    Polyethyl Glycol-Propyl Glycol (SYSTANE OP), Place 1 drop  into both eyes daily as needed (eye). As needed, Disp: , Rfl:    topiramate  (TOPAMAX ) 25 MG tablet, Take one tablet in the morning and 3 in the evening, Disp: 360 tablet, Rfl: 3   traZODone  (DESYREL ) 50 MG tablet, Take 1/2 tab at bedtime as needed, Disp: 15 tablet, Rfl: 2   trimethoprim-polymyxin b (POLYTRIM) ophthalmic solution, Place 1 drop into the right eye See admin instructions. Take 4 time a day after injection, Disp: , Rfl:    oxyCODONE  (OXY IR/ROXICODONE ) 5 MG immediate release tablet, Take 0.5-1 tablets (2.5-5 mg total) by mouth every 4 (four) hours as needed for moderate pain (pain score 4-6). (Patient not taking: Reported on 11/26/2023), Disp: 30 tablet, Rfl: 0   Allergies  Allergen Reactions   Aspirin Anaphylaxis and Hives   Elavil [Amitriptyline] Anaphylaxis and Rash   Relafen [Nabumetone] Hives   Latex Rash     Review of Systems  Constitutional: Negative.  Negative for fatigue.  Eyes: Negative.   Respiratory: Negative.    Cardiovascular: Negative.   Gastrointestinal: Negative.   Endocrine: Negative.   Genitourinary: Negative.   Skin: Negative.   Allergic/Immunologic: Negative.   Neurological: Negative.   Hematological: Negative.   Psychiatric/Behavioral:  Positive for sleep disturbance. Negative for decreased concentration.   All other systems reviewed and are negative.    Today's Vitals   11/26/23 1123  BP: 102/68  Pulse: 68  Temp: 98.2 F (36.8 C)  TempSrc: Oral  Weight: 155 lb 12.8 oz (70.7 kg)  Height: 5' 4 (1.626 m)  PainSc: 8   PainLoc: Leg   Body mass index is 26.74 kg/m.  Wt Readings from Last 3 Encounters:  11/26/23 155 lb 12.8 oz (70.7 kg)  10/28/23 159 lb 6.4 oz (72.3 kg)  06/16/23 160 lb (72.6 kg)      Objective:  Physical Exam Vitals and nursing note reviewed.  Constitutional:      General: She is not in acute distress.    Appearance: Normal appearance.  Cardiovascular:     Rate and Rhythm: Normal rate and regular rhythm.      Pulses: Normal pulses.     Heart sounds: Normal heart sounds. No murmur heard. Pulmonary:     Effort: Pulmonary effort is normal. No respiratory distress.     Breath sounds: Normal breath sounds. No wheezing.  Neurological:     General: No focal deficit present.     Mental Status: She is alert and oriented to person, place, and time.     Cranial Nerves: No cranial nerve deficit.  Psychiatric:        Mood and Affect: Mood normal.        Behavior: Behavior normal.        Thought Content: Thought content normal.        Judgment: Judgment normal.  Assessment And Plan:  Abnormal glucose Assessment & Plan: HgbA1c is stable, continue current regimen  Orders: -     Hemoglobin A1c  Elevated cholesterol Assessment & Plan: Cholesterol levels are stable.   Orders: -     BMP8+eGFR -     Lipid panel  Insomnia, unspecified type Assessment & Plan: Chronic insomnia with difficulty staying asleep and frequent awakenings. Nocturia may contribute. Melatonin ineffective. - Prescribed trazodone , half tablet at bedtime. - Follow-up in 4-6 weeks to assess trazodone  effectiveness.   Body mass index (BMI) of 26.0-26.9 in adult  Need for influenza vaccination Assessment & Plan: Influenza vaccine administered Encouraged to take Tylenol  as needed for fever or muscle aches.   Orders: -     Flu vaccine HIGH DOSE PF(Fluzone Trivalent)  Exudative age-related macular degeneration of right eye with active choroidal neovascularization (HCC) Assessment & Plan: Continue f/u with opthalmology   Other orders -     traZODone  HCl; Take 1/2 tab at bedtime as needed  Dispense: 15 tablet; Refill: 2     Return for 6 month chol check and HM; 4-6 week f/u insomnia.  Patient was given opportunity to ask questions. Patient verbalized understanding of the plan and was able to repeat key elements of the plan. All questions were answered to their satisfaction.    LILLETTE Mary Ada, FNP, have reviewed  all documentation for this visit. The documentation on 11/26/23 for the exam, diagnosis, procedures, and orders are all accurate and complete.   IF YOU HAVE BEEN REFERRED TO A SPECIALIST, IT MAY TAKE 1-2 WEEKS TO SCHEDULE/PROCESS THE REFERRAL. IF YOU HAVE NOT HEARD FROM US /SPECIALIST IN TWO WEEKS, PLEASE GIVE US  A CALL AT [phone].

## 2023-11-26 NOTE — Assessment & Plan Note (Signed)
HgbA1c is stable, continue current regimen

## 2023-11-26 NOTE — Assessment & Plan Note (Signed)
Continue f/u with opthalmology.

## 2023-11-26 NOTE — Assessment & Plan Note (Signed)
 Chronic insomnia with difficulty staying asleep and frequent awakenings. Nocturia may contribute. Melatonin ineffective. - Prescribed trazodone , half tablet at bedtime. - Follow-up in 4-6 weeks to assess trazodone  effectiveness.

## 2023-11-26 NOTE — Assessment & Plan Note (Signed)
 Influenza vaccine administered Encouraged to take Tylenol as needed for fever or muscle aches.

## 2023-11-26 NOTE — Assessment & Plan Note (Signed)
 Cholesterol levels are stable

## 2023-11-27 LAB — LIPID PANEL
Chol/HDL Ratio: 2.9 ratio (ref 0.0–4.4)
Cholesterol, Total: 155 mg/dL (ref 100–199)
HDL: 53 mg/dL (ref >39)
LDL Chol Calc (NIH): 75 mg/dL (ref 0–99)
Triglycerides: 157 mg/dL — ABNORMAL HIGH (ref 0–149)
VLDL Cholesterol Cal: 27 mg/dL (ref 5–40)

## 2023-11-27 LAB — BMP8+EGFR
BUN/Creatinine Ratio: 17 (ref 12–28)
BUN: 18 mg/dL (ref 8–27)
CO2: 19 mmol/L — ABNORMAL LOW (ref 20–29)
Calcium: 9.4 mg/dL (ref 8.7–10.3)
Chloride: 106 mmol/L (ref 96–106)
Creatinine, Ser: 1.08 mg/dL — ABNORMAL HIGH (ref 0.57–1.00)
Glucose: 75 mg/dL (ref 70–99)
Potassium: 4.3 mmol/L (ref 3.5–5.2)
Sodium: 142 mmol/L (ref 134–144)
eGFR: 51 mL/min/1.73 — ABNORMAL LOW (ref >59)

## 2023-11-27 LAB — HEMOGLOBIN A1C
Est. average glucose Bld gHb Est-mCnc: 111 mg/dL
Hgb A1c MFr Bld: 5.5 % (ref 4.8–5.6)

## 2023-12-10 DIAGNOSIS — Z1231 Encounter for screening mammogram for malignant neoplasm of breast: Secondary | ICD-10-CM | POA: Diagnosis not present

## 2023-12-10 LAB — HM MAMMOGRAPHY

## 2023-12-14 ENCOUNTER — Ambulatory Visit: Payer: Self-pay | Admitting: Nurse Practitioner

## 2023-12-14 ENCOUNTER — Encounter: Payer: Self-pay | Admitting: Nurse Practitioner

## 2023-12-18 ENCOUNTER — Other Ambulatory Visit: Payer: Self-pay | Admitting: Nurse Practitioner

## 2023-12-21 ENCOUNTER — Other Ambulatory Visit: Payer: Self-pay | Admitting: Nurse Practitioner

## 2023-12-21 DIAGNOSIS — G4700 Insomnia, unspecified: Secondary | ICD-10-CM

## 2023-12-21 MED ORDER — TRAZODONE HCL 50 MG PO TABS: ORAL_TABLET | ORAL | 2 refills | Status: DC

## 2023-12-23 ENCOUNTER — Other Ambulatory Visit: Payer: Self-pay | Admitting: Nurse Practitioner

## 2023-12-23 DIAGNOSIS — Z8673 Personal history of transient ischemic attack (TIA), and cerebral infarction without residual deficits: Secondary | ICD-10-CM

## 2023-12-29 ENCOUNTER — Encounter: Payer: Self-pay | Admitting: Nurse Practitioner

## 2023-12-29 ENCOUNTER — Ambulatory Visit: Admitting: Nurse Practitioner

## 2023-12-29 VITALS — BP 110/60 | HR 74 | Temp 98.2°F | Ht 64.0 in | Wt 159.0 lb

## 2023-12-29 DIAGNOSIS — G4700 Insomnia, unspecified: Secondary | ICD-10-CM

## 2023-12-29 DIAGNOSIS — R6889 Other general symptoms and signs: Secondary | ICD-10-CM | POA: Diagnosis not present

## 2023-12-29 DIAGNOSIS — R7309 Other abnormal glucose: Secondary | ICD-10-CM

## 2023-12-29 DIAGNOSIS — E7800 Pure hypercholesterolemia, unspecified: Secondary | ICD-10-CM

## 2023-12-29 MED ORDER — BUDESONIDE-FORMOTEROL FUMARATE 80-4.5 MCG/ACT IN AERO: 2.0000 | INHALATION_SPRAY | Freq: Every day | RESPIRATORY_TRACT | 1 refills | Status: DC

## 2023-12-29 MED ORDER — TRAZODONE HCL 50 MG PO TABS: ORAL_TABLET | ORAL | 2 refills | Status: DC

## 2023-12-29 NOTE — Progress Notes (Signed)
 I,[name], CMA,acting as a neurosurgeon for [employer], FNP.,have documented all relevant documentation on the behalf of Mary Ada, FNP,as directed by  Mary Ada, FNP while in the presence of Mary Ada, FNP.  Subjective:  Patient ID: [name] , female    DOB: October 24, 1940 , 83 y.o.   MRN: [id number]  Chief Complaint  Patient presents with   Insomnia    Patient presents today for insomnia. She reports she is still waking up in the middle of the night. She doesn't feel the sleeping pills are as effective. Patient reports she is still having mucus.     Discussed the use of AI scribe software for clinical note transcription with the patient, who gave verbal consent to proceed.  History of Present Illness [name] is an 83 year old female who presents with sleep issues.  She has ongoing sleep disturbances despite taking trazodone , which she takes at varying times before bed, usually between 10 PM and 12 AM, but sometimes stays up until 2 AM. The medication sometimes helps, but she still wakes up multiple times during the night, although less frequently than before. She typically wakes up twice a night now, compared to three or four times previously. She takes half a tablet of trazodone  as prescribed.  She reports a history of asthma and recalls being diagnosed as an adult, but is unsure if she still has asthma. There is a family history of asthma, as her father had it severely. She experiences mucus buildup, especially at night and in the morning, which she attributes to environmental factors in her home, such as dust or potential allergens like her cat. She does not currently have an albuterol  inhaler.  She takes Lortab as needed for allergies and has a nasal spray at home, though she cannot recall the name. She also takes Topamax  for headaches, with a dosage of one tablet in the morning and three in the evening. She has a history of taking clopidogrel  for stroke prevention  and mentions having a nausea medication that she uses sparingly.  She lives in her inherited mother's house and has a cat, which may contribute to her symptoms. No wheezing is reported, but there is significant mucus production, especially at night and in the morning. She does not typically take naps during the day.  She is here today with her daughter     Past Medical History:  Diagnosis Date   Abnormality of gait 04/10/2015   Anemia    Asthma    as a child   Cervical spondylosis without myelopathy 07/18/2013   Common migraine with intractable migraine 04/10/2015   DJD (degenerative joint disease)    Fibromyalgia    Left sided numbness 05/29/2013   Macular degeneration    right eye,  has injections   Migraine headache    Neuromuscular disorder (HCC)    Pneumonia    Stroke (HCC)    TIAs   TIA (transient ischemic attack) 05/29/2013   Vitreomacular adhesion of right eye 03/08/2020     Family History  Problem Relation Age of Onset   Heart disease Mother    CVA Mother    Stroke Father    Emphysema Father      Current Outpatient Medications:    acetaminophen  (TYLENOL ) 500 MG tablet, Take 1,000 mg by mouth every 6 (six) hours as needed for moderate pain (pain score 4-6) or mild pain (pain score 1-3)., Disp: , Rfl:    budesonide-formoterol (SYMBICORT) 80-4.5 MCG/ACT  inhaler, Inhale 2 puffs into the lungs daily., Disp: 3 each, Rfl: 1   Calcium  Carbonate-Vit D-Min (CALCIUM  1200 PO), Take 1,200 mg by mouth daily., Disp: , Rfl:    cetirizine (ZYRTEC) 10 MG tablet, Take 10 mg by mouth daily., Disp: , Rfl:    Cholecalciferol (VITAMIN D ) 2000 units tablet, Take 2,000 Units by mouth daily., Disp: , Rfl:    clopidogrel  (PLAVIX ) 75 MG tablet, TAKE 1 TABLET BY MOUTH EVERY DAY, Disp: 90 tablet, Rfl: 1   fluticasone  (FLONASE ) 50 MCG/ACT nasal spray, Place 2 sprays into both nostrils daily as needed for allergies or rhinitis., Disp: , Rfl:    ketorolac  (ACULAR ) 0.5 % ophthalmic solution,  Place 1 drop into the right eye 4 (four) times daily as needed (If need for infection)., Disp: , Rfl:    Multiple Vitamins-Minerals (MULTIVITAMIN WOMEN 50+ PO), Take 1 tablet by mouth daily. Chewable, Disp: , Rfl:    OVER THE COUNTER MEDICATION, Place 1 spray into both nostrils daily as needed (Allergies). astra pro nasal spray, Disp: , Rfl:    oxyCODONE  (OXY IR/ROXICODONE ) 5 MG immediate release tablet, Take 0.5-1 tablets (2.5-5 mg total) by mouth every 4 (four) hours as needed for moderate pain (pain score 4-6)., Disp: 30 tablet, Rfl: 0   Polyethyl Glycol-Propyl Glycol (SYSTANE OP), Place 1 drop into both eyes daily as needed (eye). As needed, Disp: , Rfl:    topiramate  (TOPAMAX ) 25 MG tablet, Take one tablet in the morning and 3 in the evening, Disp: 360 tablet, Rfl: 3   trimethoprim-polymyxin b (POLYTRIM) ophthalmic solution, Place 1 drop into the right eye See admin instructions. Take 4 time a day after injection, Disp: , Rfl:    traZODone  (DESYREL ) 50 MG tablet, Take 1 tab at bedtime as needed, Disp: 60 tablet, Rfl: 2   Allergies  Allergen Reactions   Aspirin Anaphylaxis and Hives   Elavil [Amitriptyline] Anaphylaxis and Rash   Relafen [Nabumetone] Hives   Latex Rash     Review of Systems  Constitutional: Negative.   Respiratory: Negative.    Musculoskeletal: Negative.   Neurological: Negative.   Psychiatric/Behavioral: Negative.       Today's Vitals   12/29/23 1127  BP: 110/60  Pulse: 74  Temp: 98.2 F (36.8 C)  TempSrc: Oral  Weight: 159 lb (72.1 kg)  Height: 5' 4 (1.626 m)  PainSc: 0-No pain   Body mass index is 27.29 kg/m.  Wt Readings from Last 3 Encounters:  12/29/23 159 lb (72.1 kg)  11/26/23 155 lb 12.8 oz (70.7 kg)  10/28/23 159 lb 6.4 oz (72.3 kg)      Objective:  Physical Exam Vitals and nursing note reviewed.  Constitutional:      General: She is not in acute distress.    Appearance: Normal appearance.  Cardiovascular:     Rate and Rhythm:  Regular rhythm.     Pulses: Normal pulses.     Heart sounds: Normal heart sounds. No murmur heard. Pulmonary:     Effort: Pulmonary effort is normal. No respiratory distress.     Breath sounds: Normal breath sounds. No wheezing.  Neurological:     General: No focal deficit present.     Mental Status: She is alert and oriented to person, place, and time.     Cranial Nerves: No cranial nerve deficit.  Psychiatric:        Mood and Affect: Mood normal.        Behavior: Behavior normal.  Thought Content: Thought content normal.        Judgment: Judgment normal.         Assessment And Plan:  Insomnia, unspecified type Assessment & Plan: Difficulty with sleep onset and maintenance. Inconsistent trazodone  use. No significant grogginess at current dose. Considered dose increase for improved sleep. - Increase trazodone  to one full tablet at bedtime as needed. Prescribe 60 tablets to avoid monthly refills.  Orders: -     traZODone  HCl; Take 1 tab at bedtime as needed  Dispense: 60 tablet; Refill: 2  Congestion of throat Assessment & Plan: Chronic mucus production over ten years, possibly due to environmental allergens. No asthma or wheezing. Mucus may affect sleep. - Prescribe inhaler for daily use. - Advise checking home for mold, dust, and cat dander. - Recommend regular air filter changes.  Orders: -     Budesonide-Formoterol Fumarate; Inhale 2 puffs into the lungs daily.  Dispense: 3 each; Refill: 1    Return for 6 week f/u mucous.  Patient was given opportunity to ask questions. Patient verbalized understanding of the plan and was able to repeat key elements of the plan. All questions were answered to their satisfaction.   LILLETTE Mary Ada, FNP, have reviewed all documentation for this visit. The documentation on 12/29/23 for the exam, diagnosis, procedures, and orders are all accurate and complete.   IF YOU HAVE BEEN REFERRED TO A SPECIALIST, IT MAY TAKE 1-2 WEEKS TO  SCHEDULE/PROCESS THE REFERRAL. IF YOU HAVE NOT HEARD FROM US /SPECIALIST IN TWO WEEKS, PLEASE GIVE US  A CALL AT [phone].

## 2024-01-05 DIAGNOSIS — R6889 Other general symptoms and signs: Secondary | ICD-10-CM | POA: Insufficient documentation

## 2024-01-05 NOTE — Assessment & Plan Note (Signed)
 Chronic mucus production over ten years, possibly due to environmental allergens. No asthma or wheezing. Mucus may affect sleep. - Prescribe inhaler for daily use. - Advise checking home for mold, dust, and cat dander. - Recommend regular air filter changes.

## 2024-01-05 NOTE — Assessment & Plan Note (Signed)
 Difficulty with sleep onset and maintenance. Inconsistent trazodone  use. No significant grogginess at current dose. Considered dose increase for improved sleep. - Increase trazodone  to one full tablet at bedtime as needed. Prescribe 60 tablets to avoid monthly refills.

## 2024-02-10 ENCOUNTER — Encounter: Payer: Self-pay | Admitting: Nurse Practitioner

## 2024-02-10 ENCOUNTER — Ambulatory Visit: Payer: Self-pay | Admitting: Nurse Practitioner

## 2024-02-10 VITALS — BP 120/70 | HR 86 | Temp 97.4°F | Ht 64.0 in | Wt 156.4 lb

## 2024-02-10 DIAGNOSIS — Z6828 Body mass index (BMI) 28.0-28.9, adult: Secondary | ICD-10-CM | POA: Diagnosis not present

## 2024-02-10 DIAGNOSIS — R6889 Other general symptoms and signs: Secondary | ICD-10-CM

## 2024-02-10 DIAGNOSIS — E663 Overweight: Secondary | ICD-10-CM | POA: Diagnosis not present

## 2024-02-10 DIAGNOSIS — Z2821 Immunization not carried out because of patient refusal: Secondary | ICD-10-CM

## 2024-02-10 DIAGNOSIS — I5032 Chronic diastolic (congestive) heart failure: Secondary | ICD-10-CM | POA: Insufficient documentation

## 2024-02-10 MED ORDER — BUDESONIDE-FORMOTEROL FUMARATE 80-4.5 MCG/ACT IN AERO: 2.0000 | INHALATION_SPRAY | Freq: Every day | RESPIRATORY_TRACT | 1 refills | Status: AC

## 2024-02-10 NOTE — Assessment & Plan Note (Addendum)
 Chronic throat congestion with mucus for over five years, possibly due to throat inflammation. - Ensure inhaler use: two puffs once daily. - Follow-up in four weeks to evaluate inhaler response. - Consider ENT referral if no improvement.

## 2024-02-10 NOTE — Assessment & Plan Note (Addendum)
 She has not had any issues and doing well.

## 2024-02-10 NOTE — Progress Notes (Signed)
 [name], CMA,acting as a neurosurgeon for Mary Ada, FNP.,have documented all relevant documentation on the behalf of Mary Ada, FNP,as directed by  Mary Ada, FNP while in the presence of Mary Ada, FNP.  Subjective:  Patient ID: [name] , female    DOB: 1940-03-15 , 83 y.o.   MRN: [id number]  Chief Complaint  Patient presents with   Throat Issue    Patient presents today for a follow up on mucus in throat, patient was directed to start using inhaler daily to help with the mucus- she didn't get the inhaler. She reports she has mucus throughout the day, but it is worse in the morning.    HPI  Discussed the use of AI scribe software for clinical note transcription with the patient, who gave verbal consent to proceed.  History of Present Illness [name] is an 83 year old female with asthma who presents with persistent throat congestion and mucus production.  She experiences persistent throat congestion characterized by mucus production, which is sometimes 'creamy looking' and varies in color from white to yellow. The mucus is constantly present in her throat, and she finds relief only by drinking water. She reports that she did not receive an inhaler.  She has a history of asthma since childhood and recalls seeing an ear, nose, and throat doctor more than five years ago for similar symptoms. She attributes these issues to allergies, which run in her family.  She experiences shortness of breath when talking extensively, particularly on the phone, and becomes hoarse. No chest congestion is reported, with issues localized to her throat.  She is currently out of Mucinex, which does not help with her throat congestion but is more effective for chest congestion. Her current medication regimen includes headache medication, but she did not specify the name or dosage.  Past Medical History:  Diagnosis Date   Abnormality of gait 04/10/2015   Anemia    Asthma    as a  child   Cervical spondylosis without myelopathy 07/18/2013   Common migraine with intractable migraine 04/10/2015   DJD (degenerative joint disease)    Fibromyalgia    Left sided numbness 05/29/2013   Macular degeneration    right eye,  has injections   Migraine headache    Neuromuscular disorder (HCC)    Pneumonia    Stroke (HCC)    TIAs   TIA (transient ischemic attack) 05/29/2013   Vitreomacular adhesion of right eye 03/08/2020     Family History  Problem Relation Age of Onset   Heart disease Mother    CVA Mother    Stroke Father    Emphysema Father      Current Outpatient Medications:    acetaminophen  (TYLENOL ) 500 MG tablet, Take 1,000 mg by mouth every 6 (six) hours as needed for moderate pain (pain score 4-6) or mild pain (pain score 1-3)., Disp: , Rfl:    Calcium  Carbonate-Vit D-Min (CALCIUM  1200 PO), Take 1,200 mg by mouth daily., Disp: , Rfl:    cetirizine (ZYRTEC) 10 MG tablet, Take 10 mg by mouth daily., Disp: , Rfl:    Cholecalciferol (VITAMIN D ) 2000 units tablet, Take 2,000 Units by mouth daily., Disp: , Rfl:    clopidogrel  (PLAVIX ) 75 MG tablet, TAKE 1 TABLET BY MOUTH EVERY DAY, Disp: 90 tablet, Rfl: 1   fluticasone  (FLONASE ) 50 MCG/ACT nasal spray, Place 2 sprays into both nostrils daily as needed for allergies or rhinitis., Disp: , Rfl:    ketorolac  (  ACULAR ) 0.5 % ophthalmic solution, Place 1 drop into the right eye 4 (four) times daily as needed (If need for infection)., Disp: , Rfl:    Multiple Vitamins-Minerals (MULTIVITAMIN WOMEN 50+ PO), Take 1 tablet by mouth daily. Chewable, Disp: , Rfl:    OVER THE COUNTER MEDICATION, Place 1 spray into both nostrils daily as needed (Allergies). astra pro nasal spray, Disp: , Rfl:    oxyCODONE  (OXY IR/ROXICODONE ) 5 MG immediate release tablet, Take 0.5-1 tablets (2.5-5 mg total) by mouth every 4 (four) hours as needed for moderate pain (pain score 4-6)., Disp: 30 tablet, Rfl: 0   Polyethyl Glycol-Propyl Glycol (SYSTANE  OP), Place 1 drop into both eyes daily as needed (eye). As needed, Disp: , Rfl:    topiramate  (TOPAMAX ) 25 MG tablet, Take one tablet in the morning and 3 in the evening, Disp: 360 tablet, Rfl: 3   traZODone  (DESYREL ) 50 MG tablet, Take 1 tab at bedtime as needed, Disp: 60 tablet, Rfl: 2   trimethoprim-polymyxin b (POLYTRIM) ophthalmic solution, Place 1 drop into the right eye See admin instructions. Take 4 time a day after injection, Disp: , Rfl:    budesonide -formoterol  (SYMBICORT ) 80-4.5 MCG/ACT inhaler, Inhale 2 puffs into the lungs daily., Disp: 3 each, Rfl: 1   Allergies  Allergen Reactions   Aspirin Anaphylaxis and Hives   Elavil [Amitriptyline] Anaphylaxis and Rash   Relafen [Nabumetone] Hives   Latex Rash     Review of Systems  Constitutional: Negative.   Respiratory: Negative.    Musculoskeletal: Negative.   Neurological: Negative.   Psychiatric/Behavioral: Negative.       Today's Vitals   02/10/24 1150  BP: 120/70  Pulse: 86  Temp: (!) 97.4 F (36.3 C)  TempSrc: Oral  Weight: 156 lb 6.4 oz (70.9 kg)  Height: 5' 4 (1.626 m)  PainSc: 0-No pain   Body mass index is 26.85 kg/m.  Wt Readings from Last 3 Encounters:  02/10/24 156 lb 6.4 oz (70.9 kg)  12/29/23 159 lb (72.1 kg)  11/26/23 155 lb 12.8 oz (70.7 kg)      Objective:  Physical Exam Vitals and nursing note reviewed.  Constitutional:      General: She is not in acute distress.    Appearance: Normal appearance.  Cardiovascular:     Rate and Rhythm: Regular rhythm.     Pulses: Normal pulses.     Heart sounds: Normal heart sounds. No murmur heard. Pulmonary:     Effort: Pulmonary effort is normal. No respiratory distress.     Breath sounds: Normal breath sounds. No wheezing.  Neurological:     General: No focal deficit present.     Mental Status: She is alert and oriented to person, place, and time.     Cranial Nerves: No cranial nerve deficit.  Psychiatric:        Mood and Affect: Mood normal.         Behavior: Behavior normal.        Thought Content: Thought content normal.        Judgment: Judgment normal.    Assessment And Plan:   Assessment & Plan Congestion of throat Chronic throat congestion with mucus for over five years, possibly due to throat inflammation. - Ensure inhaler use: two puffs once daily. - Follow-up in four weeks to evaluate inhaler response. - Consider ENT referral if no improvement. Tetanus, diphtheria, and acellular pertussis (Tdap) vaccination declined  Herpes zoster vaccination declined  Overweight with body mass index (BMI) of  28 to 28.9 in adult  Chronic diastolic (congestive) heart failure (HCC) She has not had any issues and doing well.   No orders of the defined types were placed in this encounter.    Return for 4 week f/u .  Patient was given opportunity to ask questions. Patient verbalized understanding of the plan and was able to repeat key elements of the plan. All questions were answered to their satisfaction.    Mary Mary Ada, FNP, have reviewed all documentation for this visit. The documentation on 02/10/24 for the exam, diagnosis, procedures, and orders are all accurate and complete.   IF YOU HAVE BEEN REFERRED TO A SPECIALIST, IT MAY TAKE 1-2 WEEKS TO SCHEDULE/PROCESS THE REFERRAL. IF YOU HAVE NOT HEARD FROM US /SPECIALIST IN TWO WEEKS, PLEASE GIVE US  A CALL AT [phone].

## 2024-02-27 ENCOUNTER — Other Ambulatory Visit: Payer: Self-pay | Admitting: Nurse Practitioner

## 2024-02-27 DIAGNOSIS — G4700 Insomnia, unspecified: Secondary | ICD-10-CM

## 2024-03-21 ENCOUNTER — Encounter (INDEPENDENT_AMBULATORY_CARE_PROVIDER_SITE_OTHER): Admitting: Ophthalmology

## 2024-03-29 NOTE — Progress Notes (Signed)
 LILLETTE Kristeen JINNY Gladis, CMA,acting as a neurosurgeon for Mary Ada, FNP.,have documented all relevant documentation on the behalf of Mary Ada, FNP,as directed by  Mary Ada, FNP while in the presence of Mary Ada, FNP.  Subjective:  Patient ID: [name] , female    DOB: December 07, 1940 , 84 y.o.   MRN: [id number]  Chief Complaint  Patient presents with   Throat Problem    Patient presents today for mucus in throat follow up, Patient reports compliance with medication. Patient denies any chest pain, SOB, or headaches. Patient reports her throat is much better now.     HPI  Discussed the use of AI scribe software for clinical note transcription with the patient, who gave verbal consent to proceed.  History of Present Illness [name] is an 84 year old female with fibromyalgia who presents with neck pain and postnasal drainage.  She experiences neck pain, described as a knot that sometimes radiates down her neck, primarily on the right side. The pain worsens at night. She uses a pain cream with a ball applicator on her shoulders to aid sleep and avoids using a heating pad due to concerns about her cat. She has a history of using muscle relaxers but is cautious about their use due to potential drowsiness.  Her throat symptoms have improved, and she uses Symbicort  as needed, particularly in bad and windy weather. She does not use it daily, sometimes every other day. She experiences significant mucus drainage, especially in the mornings, which has progressively worsened over the years.  She describes a sensation of nerve-like pain that travels down her neck. She sleeps on multiple pillows and is considering a body pillow for comfort, especially for her knee, which sometimes 'goes out'. She occasionally takes trazodone  for sleep, which sometimes helps, but her sleep is often disrupted by her cat and the need to use the bathroom.  She denies being a smoker but is exposed to secondhand smoke  from her granddaughter, who smokes outside. She has a history of encouraging her father and husband to quit smoking.  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   Respiratory: Negative.    Musculoskeletal: Negative.   Neurological: Negative.   Psychiatric/Behavioral: Negative.       Today's Vitals   03/30/24 1146  BP: 110/60  Pulse: 67  Temp: 98.2 F (36.8 C)  TempSrc: Oral  Weight: 160 lb (72.6 kg)  Height: 5' 4 (1.626 m)  PainSc: 0-No pain   Body mass index is 27.46 kg/m.  Wt Readings from Last 3 Encounters:  03/30/24 160 lb (72.6 kg)  02/10/24 156 lb 6.4  oz (70.9 kg)  12/29/23 159 lb (72.1 kg)      Objective:  Physical Exam Vitals and nursing note reviewed.  Constitutional:      General: She is not in acute distress.    Appearance: Normal appearance.  Cardiovascular:     Rate and Rhythm: Regular rhythm.     Pulses: Normal pulses.     Heart sounds: Normal heart sounds. No murmur heard. Pulmonary:     Effort: Pulmonary effort is normal. No respiratory distress.     Breath sounds: Normal breath sounds. No wheezing.  Neurological:     General: No focal deficit present.     Mental Status: She is alert and oriented to person, place, and time.      Cranial Nerves: No cranial nerve deficit.  Psychiatric:        Mood and Affect: Mood normal.        Behavior: Behavior normal.        Thought Content: Thought content normal.        Judgment: Judgment normal.       Assessment And Plan:   Assessment & Plan Congestion of throat Likely due to postnasal drip. Symptoms worsening over the years. Managed with Symbicort . - Encouraged daily use of Symbicort  to manage symptoms and prevent exacerbations. Exudative age-related macular degeneration of right eye with active choroidal neovascularization (HCC) Active choroidal neovascularization present. - Continue with scheduled eye injection on January 29th, 2026. Chronic diastolic (congestive) heart failure (HCC) Doing well. Wears knee-high stockings to help with swelling in her legs. Muscle tension pain Pain primarily on the right side, exacerbated by certain movements. Previous use of muscle relaxers noted. - Provided neck exercises to perform at home. - Use pain cream as needed for muscle tension. - Consider use of a heating pad or warm compress for muscle relief. - Explore the use of a body pillow for better support during sleep. - Consider referral to physical therapy if symptoms do not improve. - Discuss potential use of muscle relaxers if necessary, with caution due to potential drowsiness. Overweight with body mass index (BMI) of 27 to 27.9 in adult   No orders of the defined types were placed in this encounter.     Return for keep same next.  Patient was given opportunity to ask questions. Patient verbalized understanding of the plan and was able to repeat key elements of the plan. All questions were answered to their satisfaction.    LILLETTE Mary Ada, FNP, have reviewed all documentation for this visit. The documentation on 03/30/24 for the exam, diagnosis, procedures, and orders are all accurate and complete.   IF YOU HAVE BEEN REFERRED TO A SPECIALIST, IT MAY TAKE 1-2 WEEKS TO  SCHEDULE/PROCESS THE REFERRAL. IF YOU HAVE NOT HEARD FROM US /SPECIALIST IN TWO WEEKS, PLEASE GIVE US  A CALL AT [phone].      [1]  Current Outpatient Medications:    acetaminophen  (TYLENOL ) 500 MG tablet, Take 1,000 mg by mouth every 6 (six) hours as needed for moderate pain (pain score 4-6) or mild pain (pain score 1-3)., Disp: , Rfl:    budesonide -formoterol  (SYMBICORT ) 80-4.5 MCG/ACT inhaler, Inhale 2 puffs into the lungs daily., Disp: 3 each, Rfl: 1   Calcium  Carbonate-Vit D-Min (CALCIUM  1200 PO), Take 1,200 mg by mouth daily., Disp: , Rfl:    cetirizine (ZYRTEC) 10 MG tablet, Take 10 mg by mouth daily., Disp: , Rfl:    Cholecalciferol (VITAMIN D ) 2000 units tablet, Take 2,000 Units by  mouth daily., Disp: , Rfl:    clopidogrel  (PLAVIX ) 75 MG tablet, TAKE 1 TABLET BY MOUTH EVERY DAY, Disp: 90 tablet, Rfl: 1   fluticasone  (FLONASE ) 50 MCG/ACT nasal spray, Place 2 sprays into both nostrils daily as needed for allergies or rhinitis., Disp: , Rfl:    ketorolac  (ACULAR ) 0.5 % ophthalmic solution, Place 1 drop into the right eye 4 (four) times daily as needed (If need for infection)., Disp: , Rfl:    Multiple Vitamins-Minerals (MULTIVITAMIN WOMEN 50+ PO), Take 1 tablet by mouth daily. Chewable, Disp: , Rfl:    OVER THE COUNTER MEDICATION, Place 1 spray into both nostrils daily as needed (Allergies). astra pro nasal spray, Disp: , Rfl:    oxyCODONE  (OXY IR/ROXICODONE ) 5 MG immediate release tablet, Take 0.5-1 tablets (2.5-5 mg total) by mouth every 4 (four) hours as needed for moderate pain (pain score 4-6)., Disp: 30 tablet, Rfl: 0   Polyethyl Glycol-Propyl Glycol (SYSTANE OP), Place 1 drop into both eyes daily as needed (eye). As needed, Disp: , Rfl:    topiramate  (TOPAMAX ) 25 MG tablet, Take one tablet in the morning and 3 in the evening, Disp: 360 tablet, Rfl: 3   traZODone  (DESYREL ) 50 MG tablet, TAKE 1 TABLET BY MOUTH AT BEDTIME AS NEEDED, Disp: 90 tablet, Rfl: 2    trimethoprim-polymyxin b (POLYTRIM) ophthalmic solution, Place 1 drop into the right eye See admin instructions. Take 4 time a day after injection, Disp: , Rfl:  [2]  Allergies Allergen Reactions   Aspirin Anaphylaxis and Hives   Elavil [Amitriptyline] Anaphylaxis and Rash   Relafen [Nabumetone] Hives   Latex Rash

## 2024-03-30 ENCOUNTER — Encounter: Payer: Self-pay | Admitting: Nurse Practitioner

## 2024-03-30 ENCOUNTER — Ambulatory Visit: Admitting: Nurse Practitioner

## 2024-03-30 VITALS — BP 110/60 | HR 67 | Temp 98.2°F | Ht 64.0 in | Wt 160.0 lb

## 2024-03-30 DIAGNOSIS — I5032 Chronic diastolic (congestive) heart failure: Secondary | ICD-10-CM | POA: Diagnosis not present

## 2024-03-30 DIAGNOSIS — H353211 Exudative age-related macular degeneration, right eye, with active choroidal neovascularization: Secondary | ICD-10-CM | POA: Diagnosis not present

## 2024-03-30 DIAGNOSIS — M791 Myalgia, unspecified site: Secondary | ICD-10-CM | POA: Diagnosis not present

## 2024-03-30 DIAGNOSIS — Z6827 Body mass index (BMI) 27.0-27.9, adult: Secondary | ICD-10-CM | POA: Diagnosis not present

## 2024-03-30 DIAGNOSIS — E663 Overweight: Secondary | ICD-10-CM | POA: Diagnosis not present

## 2024-03-30 DIAGNOSIS — R6889 Other general symptoms and signs: Secondary | ICD-10-CM | POA: Diagnosis not present

## 2024-03-30 NOTE — Patient Instructions (Signed)
 Consider using a body pillow for your leg pain, look on [employer].

## 2024-04-05 NOTE — Assessment & Plan Note (Signed)
 Doing well. Wears knee-high stockings to help with swelling in her legs.

## 2024-04-05 NOTE — Assessment & Plan Note (Signed)
 Pain primarily on the right side, exacerbated by certain movements. Previous use of muscle relaxers noted. - Provided neck exercises to perform at home. - Use pain cream as needed for muscle tension. - Consider use of a heating pad or warm compress for muscle relief. - Explore the use of a body pillow for better support during sleep. - Consider referral to physical therapy if symptoms do not improve. - Discuss potential use of muscle relaxers if necessary, with caution due to potential drowsiness.

## 2024-04-05 NOTE — Assessment & Plan Note (Signed)
 Active choroidal neovascularization present. - Continue with scheduled eye injection on January 29th, 2026.

## 2024-04-05 NOTE — Assessment & Plan Note (Signed)
 Likely due to postnasal drip. Symptoms worsening over the years. Managed with Symbicort . - Encouraged daily use of Symbicort  to manage symptoms and prevent exacerbations.

## 2024-04-07 ENCOUNTER — Encounter (INDEPENDENT_AMBULATORY_CARE_PROVIDER_SITE_OTHER): Admitting: Ophthalmology

## 2024-04-07 DIAGNOSIS — H2512 Age-related nuclear cataract, left eye: Secondary | ICD-10-CM | POA: Diagnosis not present

## 2024-04-07 DIAGNOSIS — H353211 Exudative age-related macular degeneration, right eye, with active choroidal neovascularization: Secondary | ICD-10-CM

## 2024-04-07 DIAGNOSIS — H353121 Nonexudative age-related macular degeneration, left eye, early dry stage: Secondary | ICD-10-CM | POA: Diagnosis not present

## 2024-04-07 DIAGNOSIS — H43813 Vitreous degeneration, bilateral: Secondary | ICD-10-CM

## 2024-05-05 ENCOUNTER — Encounter (INDEPENDENT_AMBULATORY_CARE_PROVIDER_SITE_OTHER): Admitting: Ophthalmology

## 2024-05-25 ENCOUNTER — Ambulatory Visit: Payer: Self-pay | Admitting: Nurse Practitioner

## 2024-11-23 ENCOUNTER — Ambulatory Visit: Payer: Self-pay
